# Patient Record
Sex: Male | Born: 1950 | Race: Black or African American | Hispanic: No | Marital: Single | Smoking: Never smoker
Health system: Southern US, Community
[De-identification: ages and names within clinical notes are randomized; demographics above are authoritative.]

## PROBLEM LIST (undated history)

## (undated) DIAGNOSIS — K56600 Partial intestinal obstruction, unspecified as to cause: Secondary | ICD-10-CM

## (undated) DIAGNOSIS — N319 Neuromuscular dysfunction of bladder, unspecified: Secondary | ICD-10-CM

## (undated) DIAGNOSIS — J189 Pneumonia, unspecified organism: Secondary | ICD-10-CM

## (undated) DIAGNOSIS — E440 Moderate protein-calorie malnutrition: Secondary | ICD-10-CM

## (undated) DIAGNOSIS — I10 Essential (primary) hypertension: Secondary | ICD-10-CM

## (undated) DIAGNOSIS — Z993 Dependence on wheelchair: Secondary | ICD-10-CM

## (undated) DIAGNOSIS — E7800 Pure hypercholesterolemia, unspecified: Secondary | ICD-10-CM

## (undated) DIAGNOSIS — D649 Anemia, unspecified: Secondary | ICD-10-CM

## (undated) DIAGNOSIS — I82409 Acute embolism and thrombosis of unspecified deep veins of unspecified lower extremity: Secondary | ICD-10-CM

## (undated) DIAGNOSIS — J9600 Acute respiratory failure, unspecified whether with hypoxia or hypercapnia: Secondary | ICD-10-CM

## (undated) DIAGNOSIS — F0390 Unspecified dementia without behavioral disturbance: Secondary | ICD-10-CM

## (undated) DIAGNOSIS — Z9981 Dependence on supplemental oxygen: Secondary | ICD-10-CM

## (undated) DIAGNOSIS — E871 Hypo-osmolality and hyponatremia: Secondary | ICD-10-CM

## (undated) DIAGNOSIS — I639 Cerebral infarction, unspecified: Secondary | ICD-10-CM

## (undated) DIAGNOSIS — K219 Gastro-esophageal reflux disease without esophagitis: Secondary | ICD-10-CM

## (undated) DIAGNOSIS — G4700 Insomnia, unspecified: Secondary | ICD-10-CM

## (undated) DIAGNOSIS — D131 Benign neoplasm of stomach: Secondary | ICD-10-CM

## (undated) DIAGNOSIS — N179 Acute kidney failure, unspecified: Secondary | ICD-10-CM

## (undated) DIAGNOSIS — E119 Type 2 diabetes mellitus without complications: Secondary | ICD-10-CM

## (undated) DIAGNOSIS — K259 Gastric ulcer, unspecified as acute or chronic, without hemorrhage or perforation: Secondary | ICD-10-CM

## (undated) DIAGNOSIS — N400 Enlarged prostate without lower urinary tract symptoms: Secondary | ICD-10-CM

## (undated) DIAGNOSIS — F32A Depression, unspecified: Secondary | ICD-10-CM

## (undated) DIAGNOSIS — M1990 Unspecified osteoarthritis, unspecified site: Secondary | ICD-10-CM

## (undated) DIAGNOSIS — J984 Other disorders of lung: Secondary | ICD-10-CM

## (undated) HISTORY — DX: Anemia, unspecified: D64.9

## (undated) HISTORY — DX: Gastro-esophageal reflux disease without esophagitis: K21.9

## (undated) HISTORY — DX: Acute embolism and thrombosis of unspecified deep veins of unspecified lower extremity: I82.409

## (undated) HISTORY — DX: Gastric ulcer, unspecified as acute or chronic, without hemorrhage or perforation: K25.9

## (undated) HISTORY — DX: Cerebral infarction, unspecified: I63.9

## (undated) HISTORY — DX: Neuromuscular dysfunction of bladder, unspecified: N31.9

## (undated) HISTORY — DX: Essential (primary) hypertension: I10

## (undated) HISTORY — DX: Benign prostatic hyperplasia without lower urinary tract symptoms: N40.0

## (undated) HISTORY — PX: HIP SURGERY: SHX245

## (undated) HISTORY — DX: Acute kidney failure, unspecified: N17.9

## (undated) HISTORY — DX: Unspecified osteoarthritis, unspecified site: M19.90

## (undated) HISTORY — DX: Type 2 diabetes mellitus without complications: E11.9

## (undated) HISTORY — DX: Other disorders of lung: J98.4

## (undated) HISTORY — DX: Hypo-osmolality and hyponatremia: E87.1

## (undated) HISTORY — DX: Unspecified dementia, unspecified severity, without behavioral disturbance, psychotic disturbance, mood disturbance, and anxiety: F03.90

## (undated) HISTORY — DX: Benign neoplasm of stomach: D13.1

## (undated) HISTORY — DX: Depression, unspecified: F32.A

## (undated) HISTORY — DX: Partial intestinal obstruction, unspecified as to cause: K56.600

## (undated) HISTORY — DX: Pneumonia, unspecified organism: J18.9

## (undated) HISTORY — DX: Acute respiratory failure, unspecified whether with hypoxia or hypercapnia: J96.00

## (undated) HISTORY — DX: Insomnia, unspecified: G47.00

## (undated) HISTORY — PX: KIDNEY SURGERY: SHX687

## (undated) HISTORY — DX: Moderate protein-calorie malnutrition: E44.0

---

## 2000-11-08 ENCOUNTER — Emergency Department (HOSPITAL_COMMUNITY): Admission: EM | Admit: 2000-11-08 | Discharge: 2000-11-08 | Payer: Self-pay | Admitting: Emergency Medicine

## 2012-02-01 DIAGNOSIS — Z96642 Presence of left artificial hip joint: Secondary | ICD-10-CM | POA: Insufficient documentation

## 2016-05-14 ENCOUNTER — Encounter (INDEPENDENT_AMBULATORY_CARE_PROVIDER_SITE_OTHER): Payer: Self-pay | Admitting: *Deleted

## 2016-06-04 ENCOUNTER — Encounter (INDEPENDENT_AMBULATORY_CARE_PROVIDER_SITE_OTHER): Payer: Self-pay | Admitting: *Deleted

## 2016-09-10 ENCOUNTER — Ambulatory Visit: Payer: Self-pay | Admitting: Family Medicine

## 2016-09-23 ENCOUNTER — Ambulatory Visit (INDEPENDENT_AMBULATORY_CARE_PROVIDER_SITE_OTHER): Payer: Medicare Other | Admitting: Family Medicine

## 2016-09-23 ENCOUNTER — Encounter: Payer: Self-pay | Admitting: Family Medicine

## 2016-09-23 VITALS — BP 160/78 | HR 64 | Temp 97.7°F | Resp 18 | Ht 65.0 in | Wt 161.0 lb

## 2016-09-23 DIAGNOSIS — Z7689 Persons encountering health services in other specified circumstances: Secondary | ICD-10-CM

## 2016-09-23 DIAGNOSIS — N401 Enlarged prostate with lower urinary tract symptoms: Secondary | ICD-10-CM

## 2016-09-23 DIAGNOSIS — F0390 Unspecified dementia without behavioral disturbance: Secondary | ICD-10-CM | POA: Insufficient documentation

## 2016-09-23 DIAGNOSIS — F79 Unspecified intellectual disabilities: Secondary | ICD-10-CM | POA: Diagnosis not present

## 2016-09-23 DIAGNOSIS — N400 Enlarged prostate without lower urinary tract symptoms: Secondary | ICD-10-CM | POA: Insufficient documentation

## 2016-09-23 DIAGNOSIS — I10 Essential (primary) hypertension: Secondary | ICD-10-CM

## 2016-09-23 DIAGNOSIS — F0280 Dementia in other diseases classified elsewhere without behavioral disturbance: Secondary | ICD-10-CM | POA: Diagnosis not present

## 2016-09-23 DIAGNOSIS — E119 Type 2 diabetes mellitus without complications: Secondary | ICD-10-CM | POA: Insufficient documentation

## 2016-09-23 DIAGNOSIS — G300 Alzheimer's disease with early onset: Secondary | ICD-10-CM | POA: Diagnosis not present

## 2016-09-23 DIAGNOSIS — R338 Other retention of urine: Secondary | ICD-10-CM

## 2016-09-23 NOTE — Progress Notes (Signed)
Chief Complaint  Patient presents with  . Establish Care   Mentally impaired adult that lives in a group home He is here with one of his caregivers. She has known him for over 10 years. There are no health concerns. There are no behavior problems. He has been well cared for by his prior physician. His prior physician retired. He eats well. He sleeps well. He works every day. He has known osteoarthritis of his right knee. It is somewhat stiff. Since he continues to function well, he has not had any x-rays or additional evaluation. He has blood pressure that has been well controlled. He has diabetes that has been well controlled. He gets yearly eye exams. He gets dental exams twice a year. Shots are up-to-date. He has not had a colonoscopy, this is due.   Patient Active Problem List   Diagnosis Date Noted  . Mental impairment 09/23/2016  . Hypertension 09/23/2016  . Type 2 diabetes mellitus without complication, without long-term current use of insulin ([place]) 09/23/2016  . BPH (benign prostatic hyperplasia) 09/23/2016  . Dementia 09/23/2016    Outpatient Encounter Prescriptions as of 09/23/2016  Medication Sig  . acetaminophen (TYLENOL) 500 MG tablet Take 500 mg by mouth 2 (two) times daily as needed. For knee pain  . acetaminophen (TYLENOL) 650 MG CR tablet Take 650 mg by mouth 2 (two) times daily.  Marland Kitchen alum & mag hydroxide-simeth (MAALOX PLUS) 400-400-40 MG/5ML suspension Take 10 mLs by mouth 3 (three) times daily as needed for indigestion.  Marland Kitchen amLODipine (NORVASC) 5 MG tablet Take 5 mg by mouth daily.  Marland Kitchen aspirin EC 81 MG tablet Take 81 mg by mouth daily.  . CHANTAL SUN SCREEN SPF 30 EX Apply topically.  . cholecalciferol (VITAMIN D) 1000 units tablet Take 2,000 Units by mouth daily.  Marland Kitchen donepezil (ARICEPT) 10 MG tablet Take 10 mg by mouth at bedtime.  . Emollient (VASELINE INTENSIVE CARE EX) Apply topically. Apply twice daily for mild dry skin.  . ferrous sulfate 325 (65 FE) MG  tablet Take 325 mg by mouth daily with breakfast.  . FORA LANCETS MISC by Does not apply route. Test once daily  . glucose blood (FORA V30A BLOOD GLUCOSE TEST) test strip 1 each by Other route daily. Use as instructed  . guaiFENesin (ROBITUSSIN) 100 MG/5ML liquid Take 200 mg by mouth 4 (four) times daily as needed for cough.  . hydrochlorothiazide (MICROZIDE) 12.5 MG capsule Take 12.5 mg by mouth daily.  . hydrocortisone cream 1 % Apply 1 application topically 3 (three) times daily as needed for itching.  . loperamide (IMODIUM) 2 MG capsule Take 4 mg by mouth as needed for diarrhea or loose stools. As needed after second loose stool, max 3 doses.  . magnesium hydroxide (MILK OF MAGNESIA) 400 MG/5ML suspension Take by mouth. Take 30 ml after 3 days with no BM.  . metFORMIN (GLUCOPHAGE) 500 MG tablet Take by mouth 2 (two) times daily with a meal.  . neomycin-bacitracin-polymyxin (NEOSPORIN) [phone] ointment Apply topically as needed.  . ramipril (ALTACE) 10 MG capsule Take 10 mg by mouth daily.  . Skin Protectants, Misc. (LIP BALM) STCK Apply topically. As needed to chapped lips  . tamsulosin (FLOMAX) 0.4 MG CAPS capsule Take 0.4 mg by mouth.  Marland Kitchen UNABLE TO FIND Off skintastic as needed for biting insects.   No facility-administered encounter medications on file as of 09/23/2016.     Past Medical History:  Diagnosis Date  . Diabetes mellitus without complication (  [place])   . Hypertension     Past Surgical History:  Procedure Laterality Date  . HIP SURGERY     left  . KIDNEY SURGERY      Social History   Social History  . Marital status: Single    Spouse name: N/A  . Number of children: N/A  . Years of education: N/A   Occupational History  . Not on file.   Social History Main Topics  . Smoking status: Never Smoker  . Smokeless tobacco: Never Used  . Alcohol use No  . Drug use: No  . Sexual activity: No   Other Topics Concern  . Not on file   Social History Narrative  .  No narrative on file    Family History  Problem Relation Age of Onset  . Family history unknown: Yes    Review of Systems  Constitutional: Negative for chills, fever and weight loss.  HENT: Negative for congestion and hearing loss.   Eyes: Negative for blurred vision and pain.       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   Cardiovascular: Negative for chest pain and leg swelling.  Gastrointestinal: Negative for constipation and diarrhea.  Genitourinary: Negative for dysuria and frequency.  Musculoskeletal: Positive for joint pain. Negative for falls and myalgias.       Right knee  Neurological: Negative for dizziness and seizures.  Psychiatric/Behavioral: Negative for depression. The patient is not nervous/anxious and does not have insomnia.        No behavior issues  See history of present illness. Review of systems is largely from discussion with caregiver  BP (!) 160/78 (BP Location: Right Arm, Patient Position: Sitting, Cuff Size: Normal)   Pulse 64   Temp 97.7 F (36.5 C) (Temporal)   Resp 18   Ht 5\' 5"  (1.651 m)   Wt 161 lb 0.6 oz (73 kg)   SpO2 99%   BMI 26.80 kg/m   Physical Exam  Constitutional: He appears well-developed and well-nourished. No distress.  Pleasant and cooperative. Well dressed and groomed. Poor fund of knowledge.  HENT:  Head: Normocephalic and atraumatic.  Right Ear: External ear normal.  Left Ear: External ear normal.  Mouth/Throat: Oropharynx is clear and moist.  Partial plates, teeth well repaired  Eyes: Pupils are equal, round, and reactive to light.  Right eye with scarring  Neck: Normal range of motion. No thyromegaly present.  Cardiovascular: Normal rate, regular rhythm and normal heart sounds.   Pulmonary/Chest: Effort normal and breath sounds normal.  Abdominal: Soft. Bowel sounds are normal.  No palpable organomegaly  Lymphadenopathy:    He has no cervical adenopathy.    Neurological: He is alert.  Psychiatric: He has a normal mood and affect. His behavior is normal.   assessment/plan  1. Type 2 diabetes mellitus without complication, without long-term current use of insulin ([facility])  - Hemoglobin A1c - Lipid panel - Comprehensive metabolic panel - Urinalysis, Routine w reflex microscopic - Microalbumin, urine - CBC  2. Mental impairment   3. Essential hypertension   4. Benign prostatic hyperplasia with urinary retention   5. Early onset Alzheimer's dementia without behavioral disturbance   6. Encounter to establish care with new doctor    Patient Instructions  Need old records   Continue same medicines and treatment  See me in 3 months Get blood work prior   Raylene Everts, MD

## 2016-09-23 NOTE — Patient Instructions (Signed)
Need old records   Continue same medicines and treatment  See me in 3 months Get blood work prior

## 2016-12-19 ENCOUNTER — Ambulatory Visit: Payer: Medicare Other | Admitting: Family Medicine

## 2016-12-31 ENCOUNTER — Other Ambulatory Visit: Payer: Self-pay | Admitting: Family Medicine

## 2016-12-31 LAB — LIPID PANEL
Cholesterol: 160 mg/dL (ref <200)
HDL: 76 mg/dL (ref >40)
LDL Cholesterol: 71 mg/dL (ref <100)
Total CHOL/HDL Ratio: 2.1 Ratio (ref <5.0)
Triglycerides: 63 mg/dL (ref <150)
VLDL: 13 mg/dL (ref <30)

## 2016-12-31 LAB — CBC
HCT: 36.3 % — ABNORMAL LOW (ref 38.5–50.0)
Hemoglobin: 11.6 g/dL — ABNORMAL LOW (ref 13.2–17.1)
MCH: 28.6 pg (ref 27.0–33.0)
MCHC: 32 g/dL (ref 32.0–36.0)
MCV: 89.6 fL (ref 80.0–100.0)
MPV: 10.6 fL (ref 7.5–12.5)
Platelets: 236 10*3/uL (ref 140–400)
RBC: 4.05 MIL/uL — ABNORMAL LOW (ref 4.20–5.80)
RDW: 13.6 % (ref 11.0–15.0)
WBC: 5.4 10*3/uL (ref 3.8–10.8)

## 2016-12-31 LAB — COMPREHENSIVE METABOLIC PANEL
ALT: 20 U/L (ref 9–46)
AST: 17 U/L (ref 10–35)
Albumin: 3.7 g/dL (ref 3.6–5.1)
Alkaline Phosphatase: 60 U/L (ref 40–115)
BUN: 25 mg/dL (ref 7–25)
CO2: 27 mmol/L (ref 20–31)
Calcium: 10.1 mg/dL (ref 8.6–10.3)
Chloride: 99 mmol/L (ref 98–110)
Creat: 1.29 mg/dL — ABNORMAL HIGH (ref 0.70–1.25)
Glucose, Bld: 98 mg/dL (ref 65–99)
Potassium: 4.4 mmol/L (ref 3.5–5.3)
Sodium: 137 mmol/L (ref 135–146)
Total Bilirubin: 0.4 mg/dL (ref 0.2–1.2)
Total Protein: 6.5 g/dL (ref 6.1–8.1)

## 2017-01-01 ENCOUNTER — Encounter: Payer: Self-pay | Admitting: Family Medicine

## 2017-01-01 ENCOUNTER — Ambulatory Visit (INDEPENDENT_AMBULATORY_CARE_PROVIDER_SITE_OTHER): Payer: Medicare Other | Admitting: Family Medicine

## 2017-01-01 VITALS — BP 130/82 | HR 64 | Temp 97.3°F | Resp 16 | Ht 65.0 in | Wt 160.1 lb

## 2017-01-01 DIAGNOSIS — M25561 Pain in right knee: Secondary | ICD-10-CM | POA: Diagnosis not present

## 2017-01-01 DIAGNOSIS — G300 Alzheimer's disease with early onset: Secondary | ICD-10-CM

## 2017-01-01 DIAGNOSIS — M1711 Unilateral primary osteoarthritis, right knee: Secondary | ICD-10-CM

## 2017-01-01 DIAGNOSIS — F0280 Dementia in other diseases classified elsewhere without behavioral disturbance: Secondary | ICD-10-CM

## 2017-01-01 DIAGNOSIS — G8929 Other chronic pain: Secondary | ICD-10-CM

## 2017-01-01 DIAGNOSIS — E119 Type 2 diabetes mellitus without complications: Secondary | ICD-10-CM

## 2017-01-01 DIAGNOSIS — I10 Essential (primary) hypertension: Secondary | ICD-10-CM | POA: Diagnosis not present

## 2017-01-01 LAB — URINALYSIS, ROUTINE W REFLEX MICROSCOPIC
Bilirubin Urine: NEGATIVE
Glucose, UA: NEGATIVE
Hgb urine dipstick: NEGATIVE
Ketones, ur: NEGATIVE
Leukocytes, UA: NEGATIVE
Nitrite: NEGATIVE
Protein, ur: NEGATIVE
Specific Gravity, Urine: 1.014 (ref 1.001–1.035)
pH: 5.5 (ref 5.0–8.0)

## 2017-01-01 LAB — HEMOGLOBIN A1C
Hgb A1c MFr Bld: 6.7 % — ABNORMAL HIGH (ref <5.7)
Mean Plasma Glucose: 146 mg/dL

## 2017-01-01 LAB — MICROALBUMIN, URINE: Microalb, Ur: 0.4 mg/dL

## 2017-01-01 NOTE — Progress Notes (Signed)
Chief Complaint  Patient presents with  . Follow-up   Here for follow up with one of his caregivers. They have no problems to report. They bring a log of daily blood sugars. They're always under 150. We discussed his recent lab work. His hemoglobin A1c is 6.7. This is excellent diabetes control. His LDL is 71. He is mildly anemic but this appears to be stable. He is compliant with medications. He is active in his home and at his job. He is good about his diet. He does not complain of pain in his knee. He does limp. We discussed immunization update. At the facility he needs a flu shot and Prevnar at this fall. He also should have a shingles series. He needs a tetanus over 10 years. He's never had colon cancer screening. I told them again today that this is a good idea. They need to talk to his guardian for permission. Patient Active Problem List   Diagnosis Date Noted  . Right knee pain 01/01/2017  . Osteoarthritis of right knee 01/01/2017  . Mental impairment 09/23/2016  . Hypertension 09/23/2016  . Type 2 diabetes mellitus without complication, without long-term current use of insulin ([place]) 09/23/2016  . BPH (benign prostatic hyperplasia) 09/23/2016  . Dementia 09/23/2016    Outpatient Encounter Prescriptions as of 01/01/2017  Medication Sig  . acetaminophen (TYLENOL) 650 MG CR tablet Take 650 mg by mouth 2 (two) times daily.  Marland Kitchen alum & mag hydroxide-simeth (MAALOX PLUS) 400-400-40 MG/5ML suspension Take 10 mLs by mouth 3 (three) times daily as needed for indigestion.  Marland Kitchen amLODipine (NORVASC) 5 MG tablet Take 5 mg by mouth daily.  Marland Kitchen aspirin EC 81 MG tablet Take 81 mg by mouth daily.  . CHANTAL SUN SCREEN SPF 30 EX Apply topically.  . cholecalciferol (VITAMIN D) 1000 units tablet Take 2,000 Units by mouth daily.  Marland Kitchen donepezil (ARICEPT) 10 MG tablet Take 10 mg by mouth at bedtime.  . Emollient (VASELINE INTENSIVE CARE EX) Apply topically. Apply twice daily for mild dry skin.  .  ferrous sulfate 325 (65 FE) MG tablet Take 325 mg by mouth daily with breakfast.  . FORA LANCETS MISC by Does not apply route. Test once daily  . glucose blood (FORA V30A BLOOD GLUCOSE TEST) test strip 1 each by Other route daily. Use as instructed  . guaiFENesin (ROBITUSSIN) 100 MG/5ML liquid Take 200 mg by mouth 4 (four) times daily as needed for cough.  . hydrochlorothiazide (MICROZIDE) 12.5 MG capsule Take 12.5 mg by mouth daily.  . hydrocortisone cream 1 % Apply 1 application topically 3 (three) times daily as needed for itching.  . loperamide (IMODIUM) 2 MG capsule Take 4 mg by mouth as needed for diarrhea or loose stools. As needed after second loose stool, max 3 doses.  . magnesium hydroxide (MILK OF MAGNESIA) 400 MG/5ML suspension Take by mouth. Take 30 ml after 3 days with no BM.  . metFORMIN (GLUCOPHAGE) 500 MG tablet Take by mouth 2 (two) times daily with a meal.  . neomycin-bacitracin-polymyxin (NEOSPORIN) [phone] ointment Apply topically as needed.  . ramipril (ALTACE) 10 MG capsule Take 10 mg by mouth daily.  . Skin Protectants, Misc. (LIP BALM) STCK Apply topically. As needed to chapped lips  . tamsulosin (FLOMAX) 0.4 MG CAPS capsule Take 0.4 mg by mouth.  Marland Kitchen UNABLE TO FIND Off skintastic as needed for biting insects.  . [DISCONTINUED] acetaminophen (TYLENOL) 500 MG tablet Take 500 mg by mouth 2 (two) times daily  as needed. For knee pain   No facility-administered encounter medications on file as of 01/01/2017.     No Known Allergies  Review of Systems  Constitutional: Negative for activity change, appetite change, fatigue and unexpected weight change.  HENT: Negative for congestion and dental problem.        Yearly dental exam  Eyes: Negative for photophobia and visual disturbance.       Yearly eye exam  Respiratory: Negative for cough and choking.   Cardiovascular: Negative for chest pain, palpitations and leg swelling.  Gastrointestinal: Negative for blood in stool,  constipation and diarrhea.  Genitourinary: Negative for difficulty urinating and frequency.  Musculoskeletal: Positive for arthralgias and gait problem.       Right knee arthritis  Neurological: Negative for dizziness and headaches.  Psychiatric/Behavioral: Negative for dysphoric mood and sleep disturbance.       No behaviors  All other systems reviewed and are negative.   BP 130/82 (BP Location: Right Arm, Patient Position: Sitting, Cuff Size: Normal)   Pulse 64   Temp (!) 97.3 F (36.3 C) (Temporal)   Resp 16   Ht 5\' 5"  (1.651 m)   Wt 160 lb 1.9 oz (72.6 kg)   SpO2 99%   BMI 26.65 kg/m   Physical Exam  Constitutional: He appears well-developed and well-nourished. No distress.  Poor eye contact. Mentally impaired.  HENT:  Head: Normocephalic and atraumatic.  Mouth/Throat: Oropharynx is clear and moist.  Eyes: Pupils are equal, round, and reactive to light. Conjunctivae are normal.  Neck: Normal range of motion. No thyromegaly present.  Cardiovascular: Normal rate, regular rhythm and normal heart sounds.   Pulmonary/Chest: Effort normal and breath sounds normal.  Musculoskeletal:  Right knee has warmth and crepitus. Gait has small steps, slightly  flexed posture, minimal limp  Neurological: He is alert.  Psychiatric: His behavior is normal.    ASSESSMENT/PLAN:  1. Chronic pain of right knee  2. Primary osteoarthritis of right knee  3. Type 2 diabetes mellitus without complication, without long-term current use of insulin ([place])  4. Essential hypertension  5. Early onset Alzheimer's dementia without behavioral disturbance     Patient Instructions  Recommend screening for colon cancer  Recommend he get a flu shot and a prevnar 13 this fall Needs Shingrix series of 2 shots Needs TdaP if has not had in 10 years  Diabetes, hypertension and cholesterol are all WELL controlled  May do fingerstick checks once a week and prn illness/symptoms  See me in 6  months  Call sooner for any problems   Raylene Everts, MD

## 2017-01-01 NOTE — Patient Instructions (Signed)
Recommend screening for colon cancer  Recommend he get a flu shot and a prevnar 13 this fall Needs Shingrix series of 2 shots Needs TdaP if has not had in 10 years  Diabetes, hypertension and cholesterol are all WELL controlled  May do fingerstick checks once a week and prn illness/symptoms  See me in 6 months  Call sooner for any problems

## 2017-01-09 ENCOUNTER — Other Ambulatory Visit: Payer: Self-pay | Admitting: Family Medicine

## 2017-04-10 ENCOUNTER — Emergency Department (HOSPITAL_COMMUNITY): Payer: Medicare Other

## 2017-04-10 ENCOUNTER — Encounter (HOSPITAL_COMMUNITY): Payer: Self-pay | Admitting: Emergency Medicine

## 2017-04-10 ENCOUNTER — Emergency Department (HOSPITAL_COMMUNITY)
Admission: EM | Admit: 2017-04-10 | Discharge: 2017-04-10 | Disposition: A | Discharge status: Home / Self Care | Payer: Medicare Other | Attending: Emergency Medicine | Admitting: Emergency Medicine

## 2017-04-10 DIAGNOSIS — E119 Type 2 diabetes mellitus without complications: Secondary | ICD-10-CM | POA: Insufficient documentation

## 2017-04-10 DIAGNOSIS — F0390 Unspecified dementia without behavioral disturbance: Secondary | ICD-10-CM | POA: Diagnosis not present

## 2017-04-10 DIAGNOSIS — S7001XA Contusion of right hip, initial encounter: Secondary | ICD-10-CM | POA: Insufficient documentation

## 2017-04-10 DIAGNOSIS — Y93H2 Activity, gardening and landscaping: Secondary | ICD-10-CM | POA: Insufficient documentation

## 2017-04-10 DIAGNOSIS — S5001XA Contusion of right elbow, initial encounter: Secondary | ICD-10-CM

## 2017-04-10 DIAGNOSIS — Z79899 Other long term (current) drug therapy: Secondary | ICD-10-CM | POA: Diagnosis not present

## 2017-04-10 DIAGNOSIS — Y929 Unspecified place or not applicable: Secondary | ICD-10-CM | POA: Diagnosis not present

## 2017-04-10 DIAGNOSIS — I10 Essential (primary) hypertension: Secondary | ICD-10-CM | POA: Diagnosis not present

## 2017-04-10 DIAGNOSIS — Z7982 Long term (current) use of aspirin: Secondary | ICD-10-CM | POA: Diagnosis not present

## 2017-04-10 DIAGNOSIS — S59901A Unspecified injury of right elbow, initial encounter: Secondary | ICD-10-CM | POA: Diagnosis present

## 2017-04-10 DIAGNOSIS — Z7984 Long term (current) use of oral hypoglycemic drugs: Secondary | ICD-10-CM | POA: Diagnosis not present

## 2017-04-10 DIAGNOSIS — Y999 Unspecified external cause status: Secondary | ICD-10-CM | POA: Diagnosis not present

## 2017-04-10 DIAGNOSIS — W1830XA Fall on same level, unspecified, initial encounter: Secondary | ICD-10-CM | POA: Diagnosis not present

## 2017-04-10 DIAGNOSIS — W19XXXA Unspecified fall, initial encounter: Secondary | ICD-10-CM

## 2017-04-10 IMAGING — DX DG ELBOW COMPLETE 3+V*R*
4 series · 4 of 4 positions shown · non-contrast
Comparison: None.

CLINICAL DATA: Posterior right elbow pain and abrasion after fall.

EXAM:
RIGHT ELBOW - COMPLETE 3+ VIEW

[elbow ap]
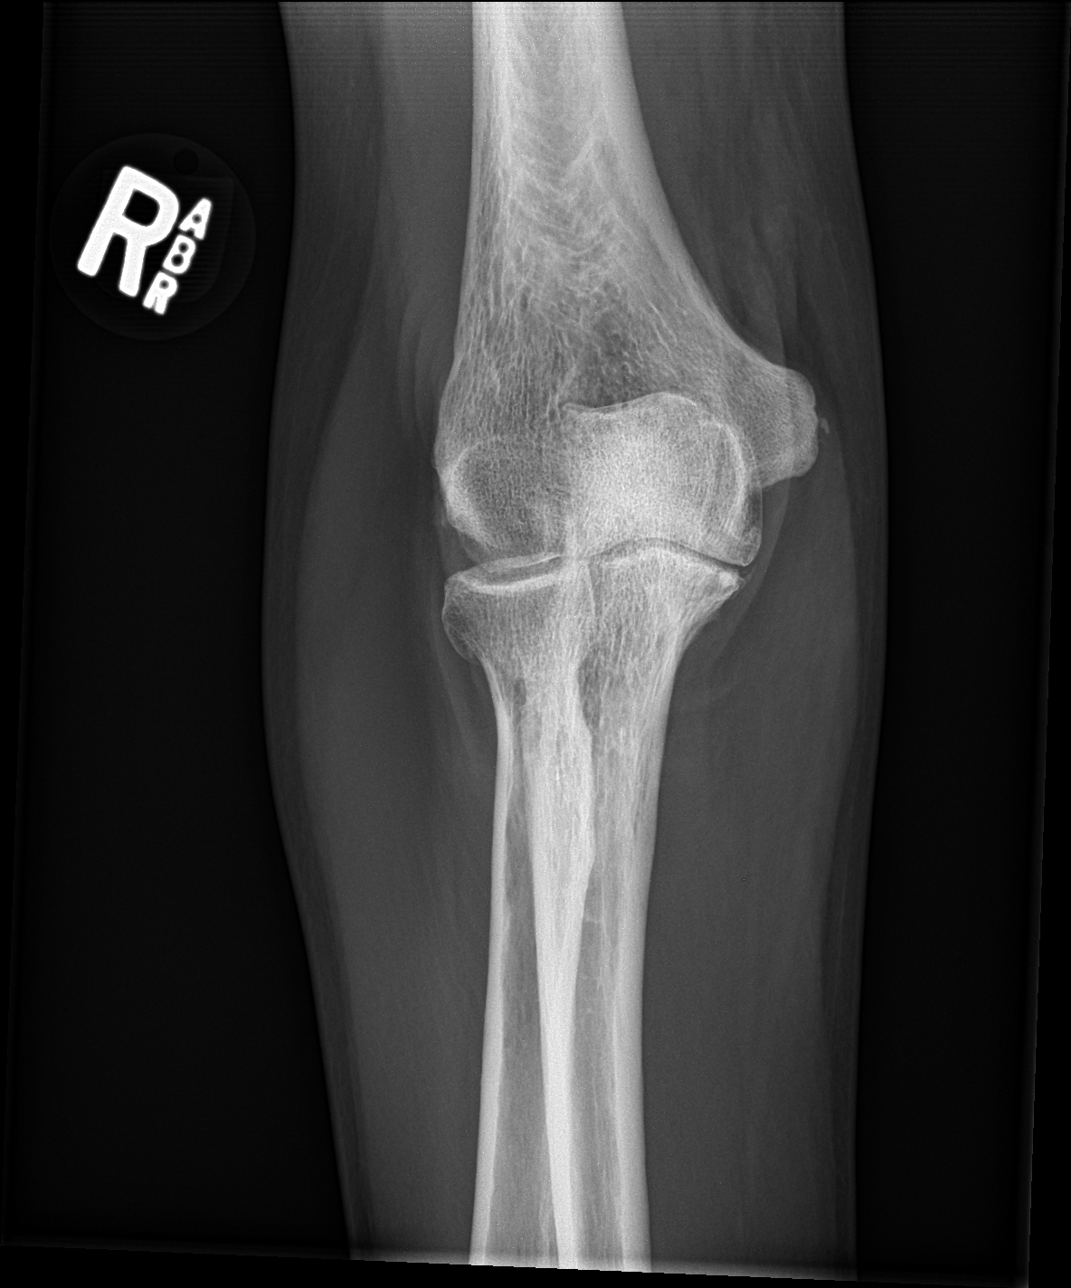

[elbow obl (1 of 2)]
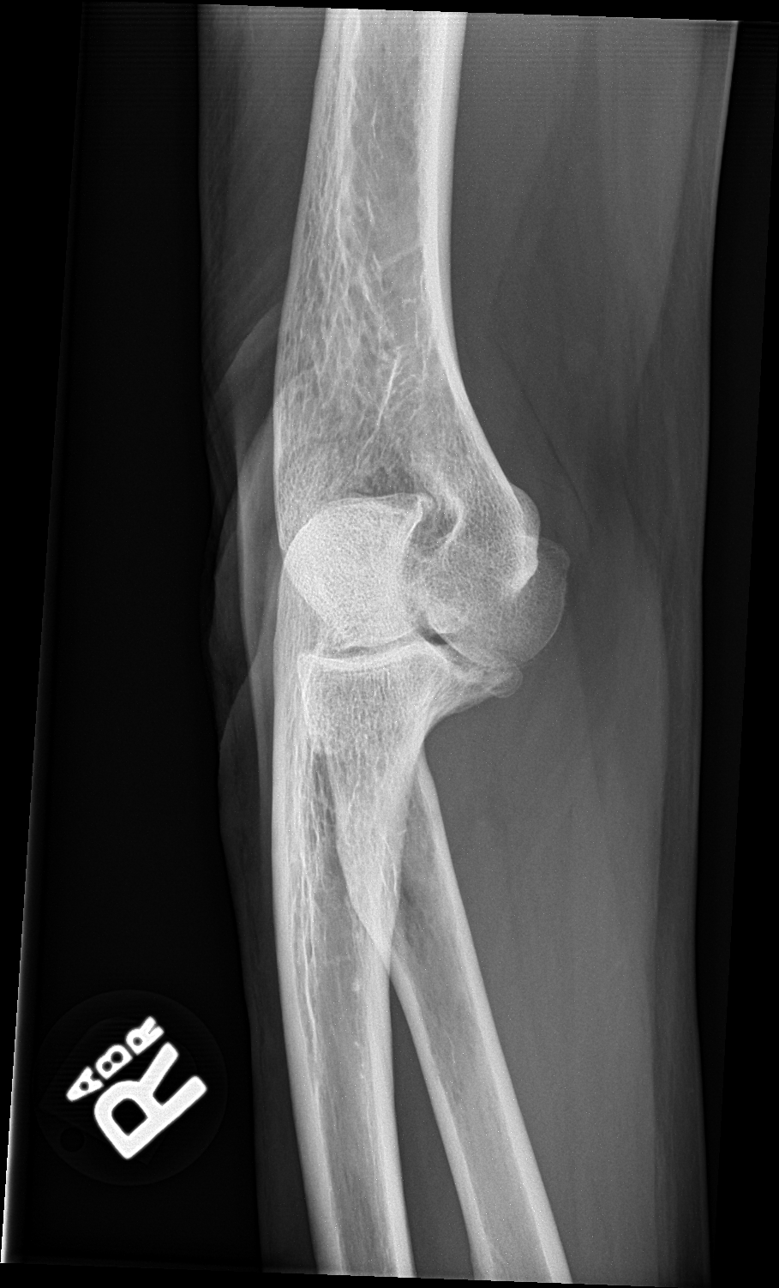

[elbow obl (2 of 2)]
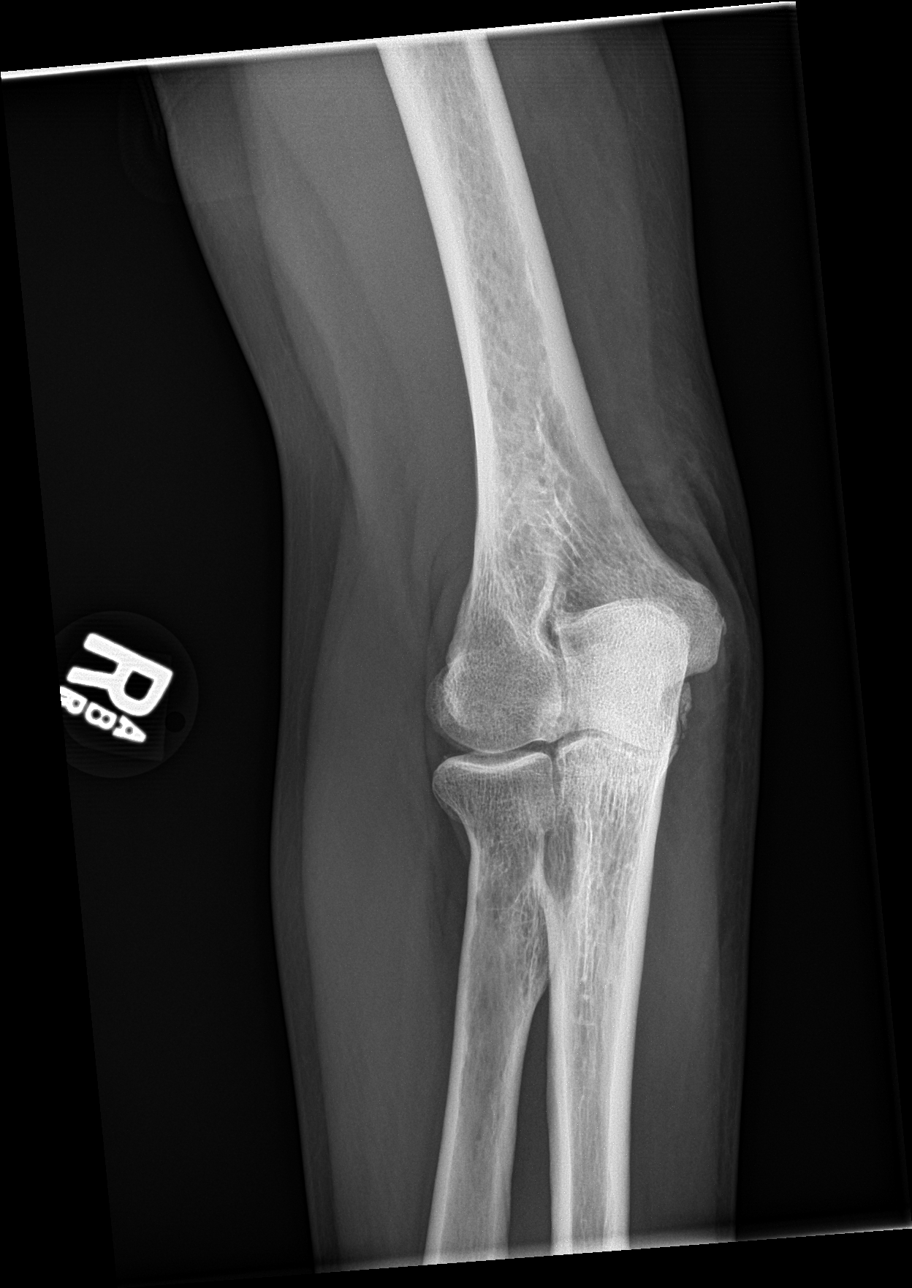

[elbow lat]
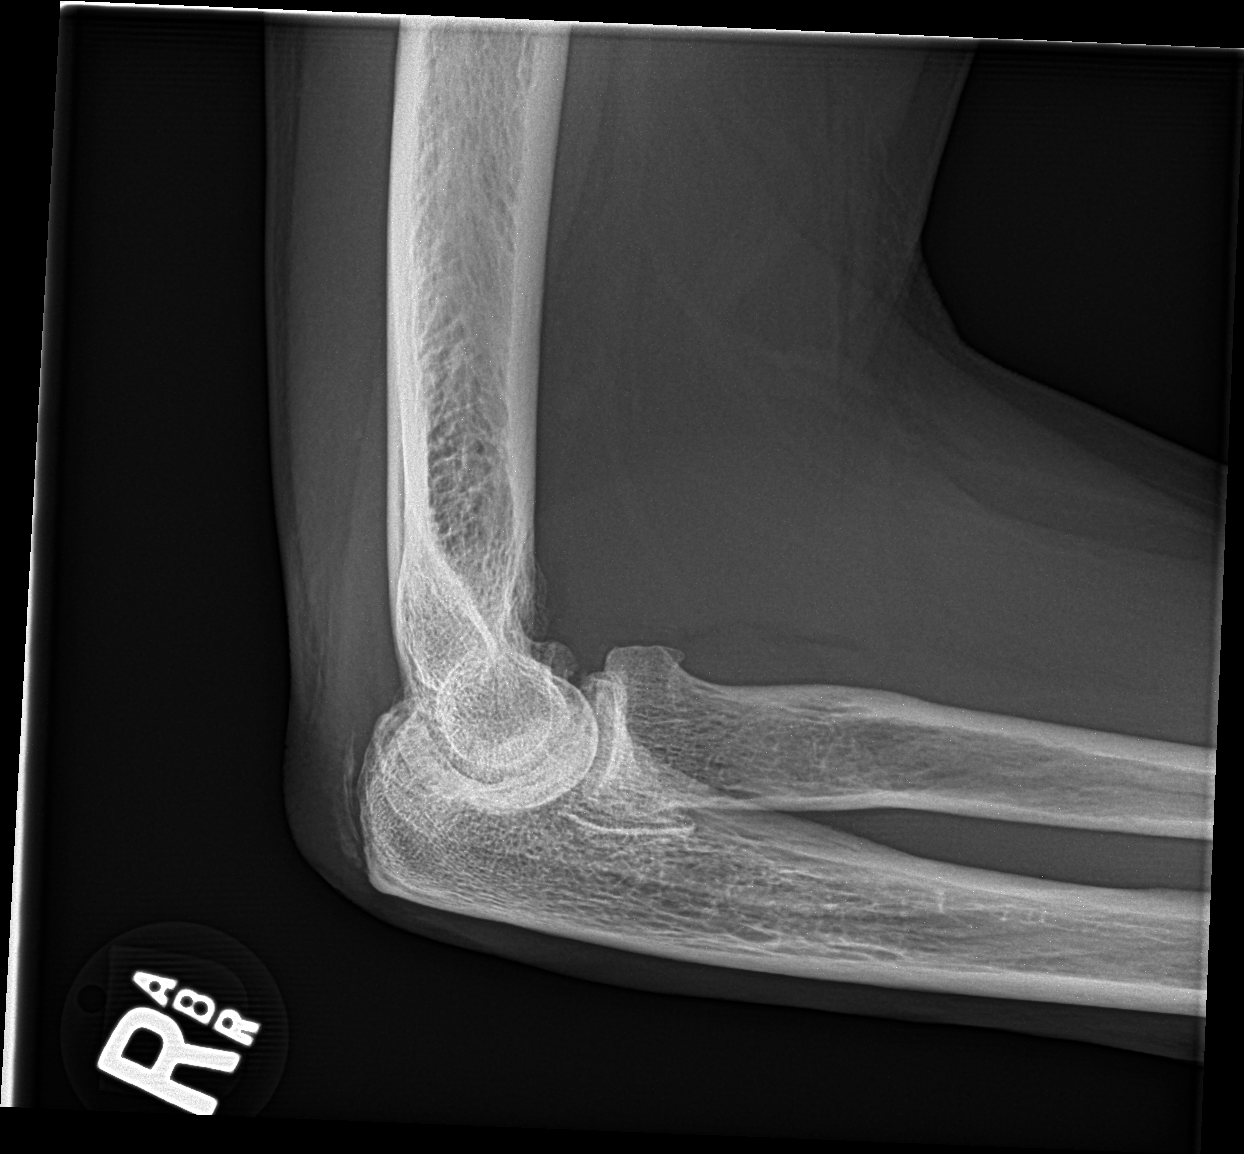

[4 of 4 positions shown; findings below may reference images not displayed]

FINDINGS: Notable spurring of the radial head, distal humerus, and olecranon
with calcifications in the distal triceps tendon. Faint
calcifications in the proximal common extensor tendon and common
flexor tendon. Probable mild chondrocalcinosis along the sublime
tubercle attachment site of the ulnar collateral ligament.

Articular space narrowing in the elbow, likely degenerative.

The anterior fat pad is indistinct.  No definite posterior fat pad.

Soft tissue swelling overlying the olecranon.

I do not appreciate an acute fracture.
IMPRESSION: 1. Spurring along with some ligamentous and tendinous calcifications
as noted above.
2. Indeterminate for elbow effusion, the anterior fat pad is
indistinct.
3. Articular space narrowing in the elbow.
4. Mild dorsal soft tissue swelling overlying the olecranon,
potentially from subcutaneous edema or mild olecranon bursitis.

## 2017-04-10 IMAGING — DX DG HIP (WITH OR WITHOUT PELVIS) 2-3V*R*
3 series · 3 of 3 positions shown · non-contrast
Comparison: None.

CLINICAL DATA: Fall today.  Right hip pain.

EXAM:
DG HIP (WITH OR WITHOUT PELVIS) 2-3V RIGHT

[pelvis ap (1 of 2)]
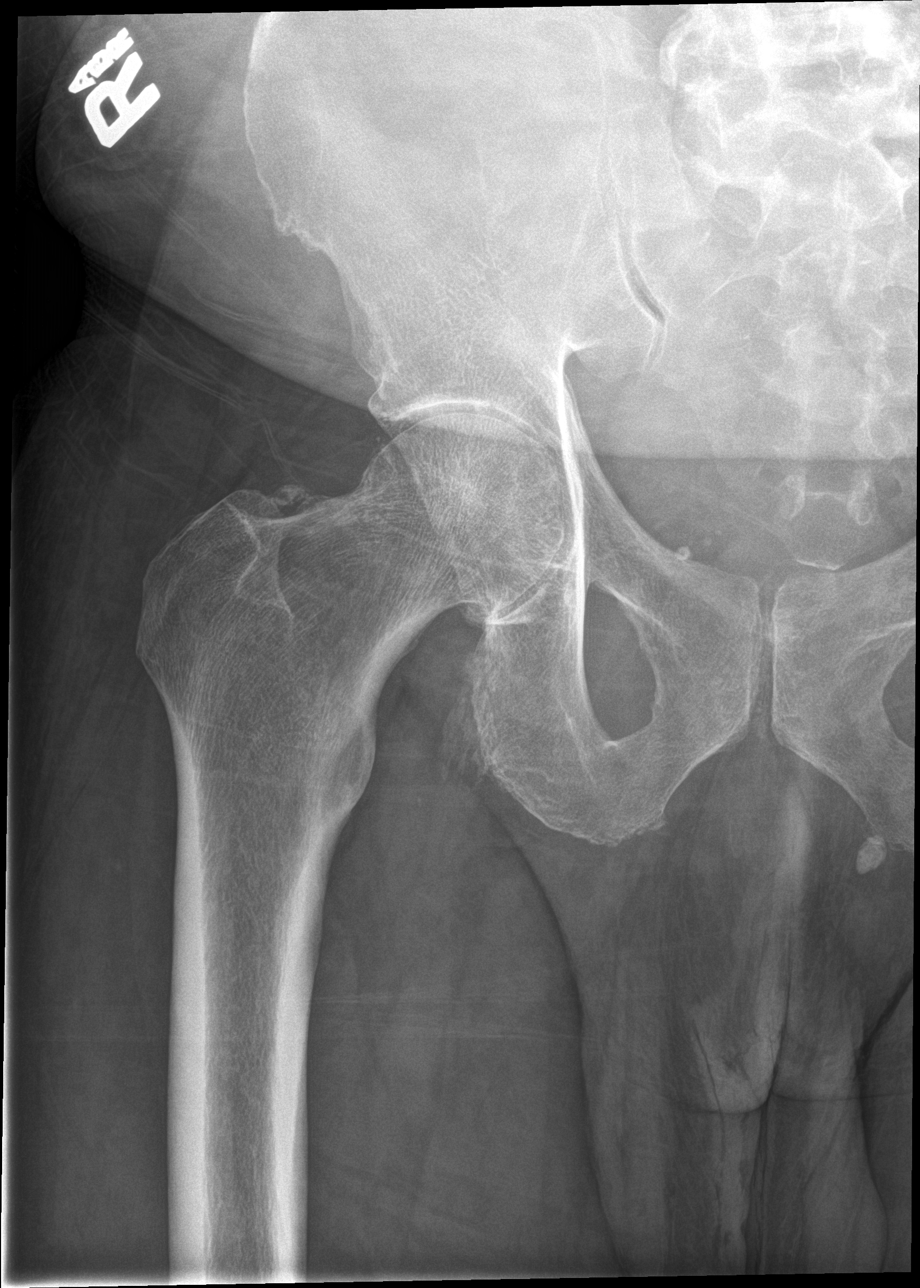

[hip lat]
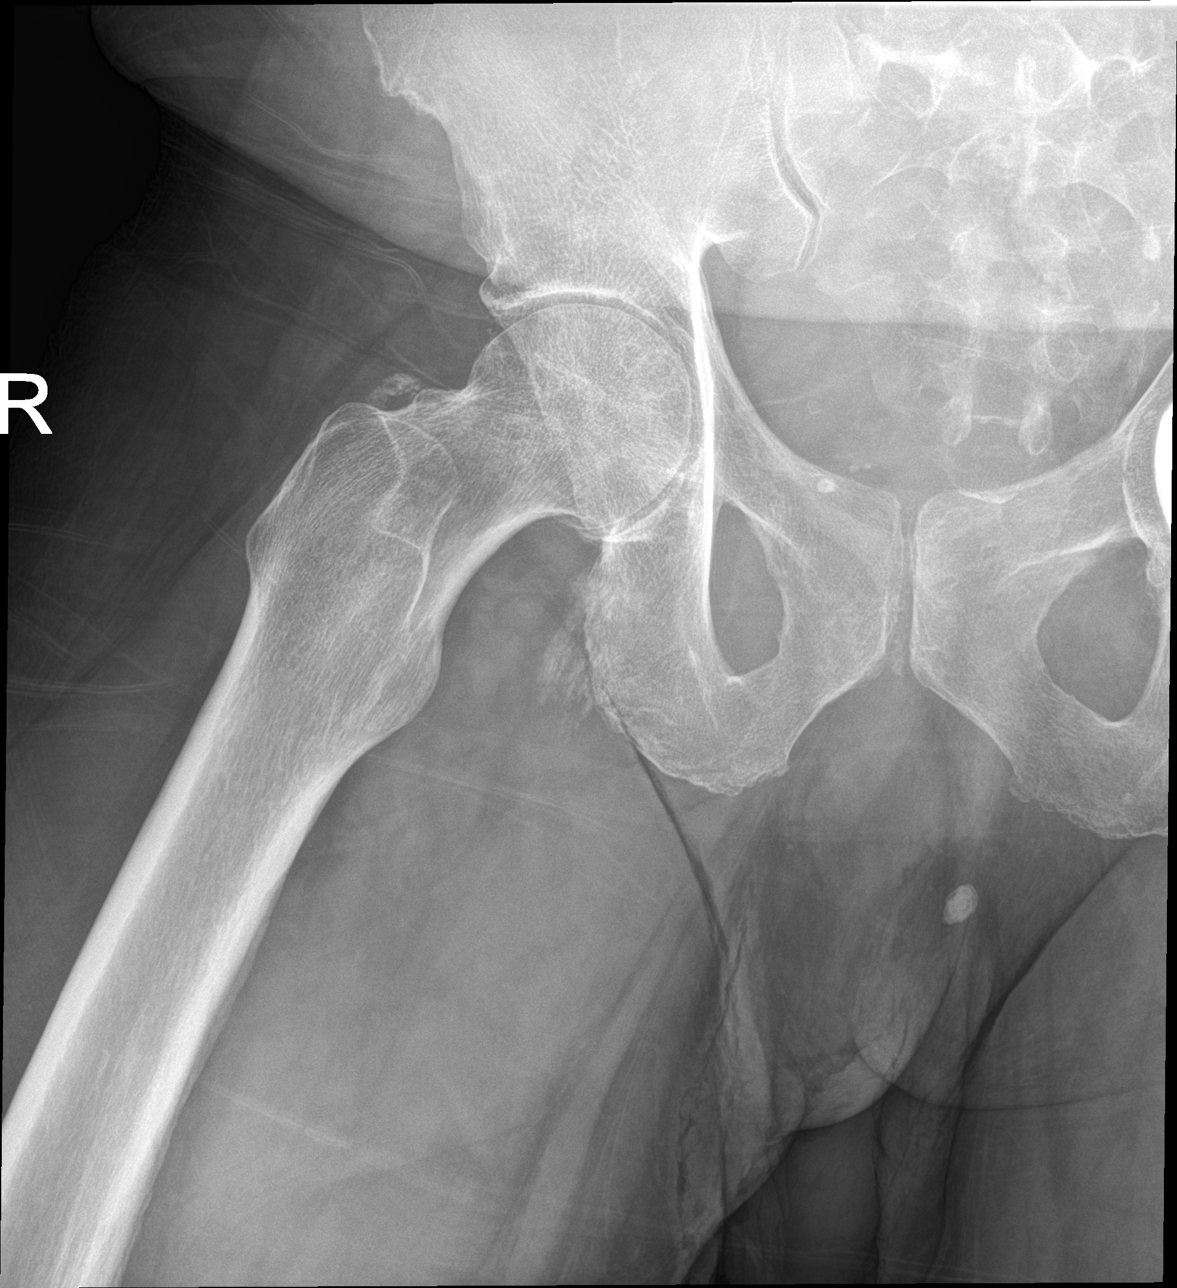

[pelvis ap (2 of 2)]
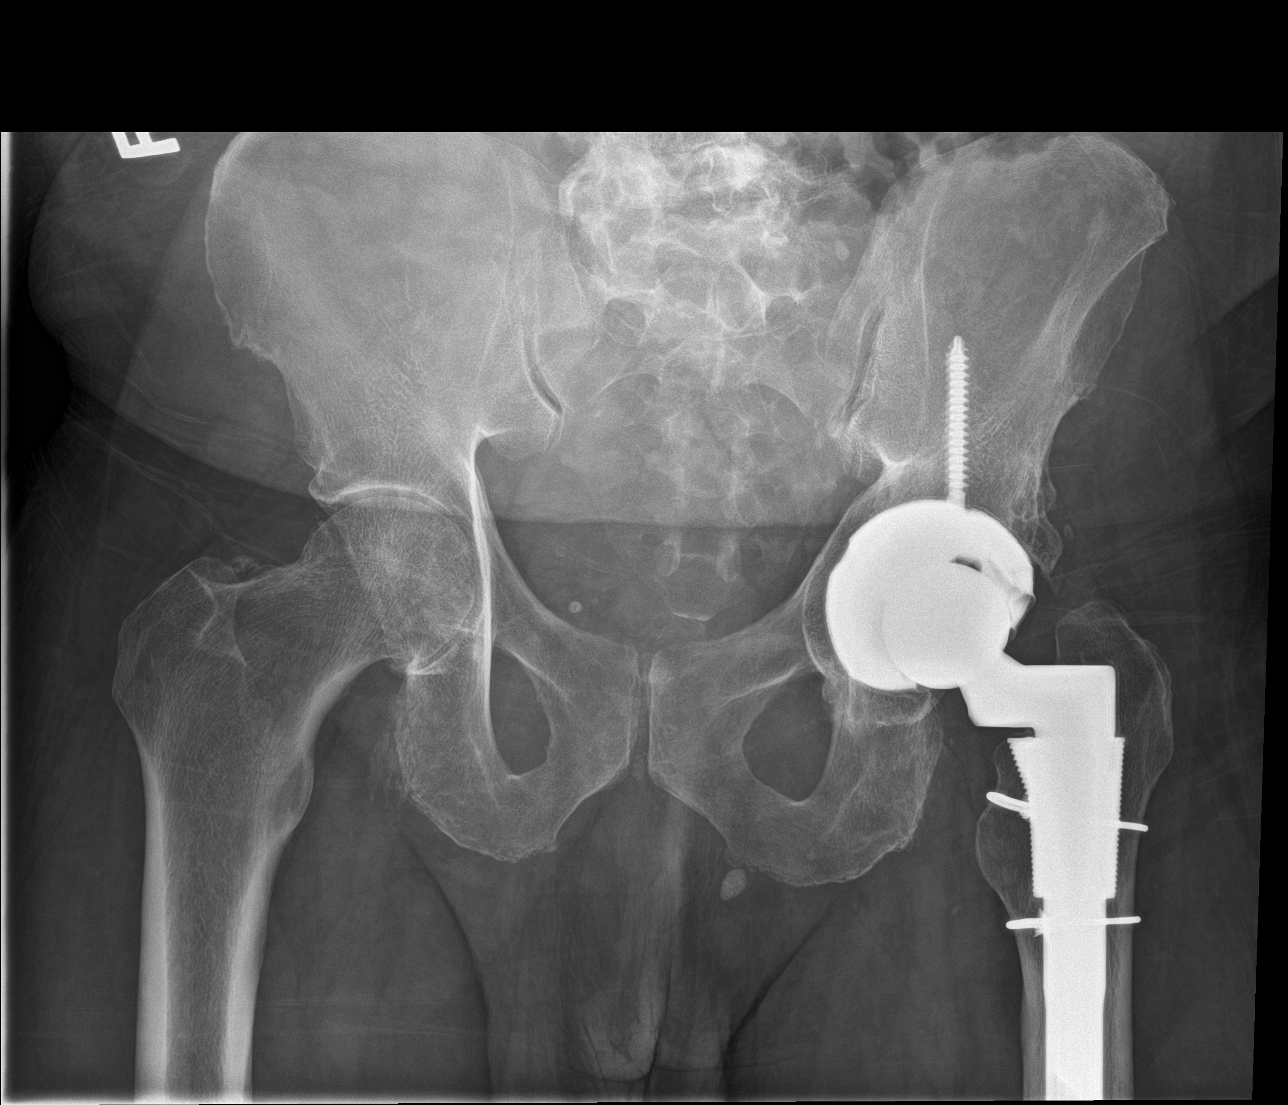

[3 of 3 positions shown; findings below may reference images not displayed]

FINDINGS: The right hip is located. No acute bone or soft tissue abnormalities
are present. Calcifications are noted along the hamstring insertion
at the ischemic tuberosity. Left total hip arthroplasty is noted.
Marked degenerative changes are present in the lower lumbar spine.
IMPRESSION: 1. No acute abnormality of the right hip.
2. Mild degenerative changes in the right hip.
3. Left total hip arthroplasty.

## 2017-04-10 MED ORDER — ACETAMINOPHEN 325 MG PO TABS
650.0000 mg | ORAL_TABLET | Freq: Once | ORAL | Status: AC
  Administered 2017-04-10: 650 mg via ORAL
  Filled 2017-04-10: qty 2

## 2017-04-10 MED ORDER — IBUPROFEN 800 MG PO TABS
800.0000 mg | ORAL_TABLET | Freq: Once | ORAL | Status: AC
  Administered 2017-04-10: 800 mg via ORAL
  Filled 2017-04-10: qty 1

## 2017-04-10 NOTE — ED Triage Notes (Addendum)
Patient c/o right hip pain and right elbow pain. Patient states lost balance while trying to rake leaves. Patient reports landing on left side. Denies hitting head or LOC. Patient has had hip replacement. No obvious deformity. Patient ambulatory.

## 2017-04-10 NOTE — Discharge Instructions (Signed)
Your x-rays are negative for fracture or dislocation.  Please use 600 mg of ibuprofen and 500 mg of Tylenol every 6 hours as needed for soreness.  Apply an ice pack to your hip and thigh area.  See Dr. Meda Coffee, or return to the emergency department if not improving.

## 2017-04-10 NOTE — ED Provider Notes (Signed)
[facility] EMERGENCY DEPARTMENT Provider Note   CSN: [id number] Arrival date & time: 04/10/17  1734     History   Chief Complaint Chief Complaint  Patient presents with  . Fall    HPI Daniel [name] is a 66 y.o. male.  Patient is a 66 year old male who presents to the emergency department with his caretaker following a fall.  The patient states that he was raking some leaves and cleaning up when he lost his balance and fell.  He states he injured his left elbow and left hip and left side.  He denies difficulty with breathing.  He denies hitting his head.  His caregiver states that he is not on any anticoagulation medications and he has no history of bleeding disorders.  The patient has been ambulatory since the fall without problem.      Past Medical History:  Diagnosis Date  . Diabetes mellitus without complication ([place])   . Hypertension     Patient Active Problem List   Diagnosis Date Noted  . Right knee pain 01/01/2017  . Osteoarthritis of right knee 01/01/2017  . Mental impairment 09/23/2016  . Hypertension 09/23/2016  . Type 2 diabetes mellitus without complication, without long-term current use of insulin ([place]) 09/23/2016  . BPH (benign prostatic hyperplasia) 09/23/2016  . Dementia 09/23/2016    Past Surgical History:  Procedure Laterality Date  . HIP SURGERY     left  . KIDNEY SURGERY         Home Medications    Prior to Admission medications   Medication Sig Start Date End Date Taking? Authorizing Provider  acetaminophen (TYLENOL) 650 MG CR tablet Take 650 mg by mouth 2 (two) times daily.    [provider]  alum & mag hydroxide-simeth (MAALOX PLUS) 400-400-40 MG/5ML suspension Take 10 mLs by mouth 3 (three) times daily as needed for indigestion.    [provider]  amLODipine (NORVASC) 5 MG tablet Take 5 mg by mouth daily.    [provider]  aspirin EC 81 MG tablet Take 81 mg by mouth daily.    [provider]    CHANTAL SUN SCREEN SPF 30 EX Apply topically.    [provider]  cholecalciferol (VITAMIN D) 1000 units tablet Take 2,000 Units by mouth daily.    [provider]  donepezil (ARICEPT) 10 MG tablet Take 10 mg by mouth at bedtime.    [provider]  Emollient (VASELINE INTENSIVE CARE EX) Apply topically. Apply twice daily for mild dry skin.    [provider]  ferrous sulfate 325 (65 FE) MG tablet Take 325 mg by mouth daily with breakfast.    [provider]  FORA LANCETS MISC by Does not apply route. Test once daily    [provider]  glucose blood (FORA V30A BLOOD GLUCOSE TEST) test strip 1 each by Other route daily. Use as instructed    [provider]  guaiFENesin (ROBITUSSIN) 100 MG/5ML liquid Take 200 mg by mouth 4 (four) times daily as needed for cough.    [provider]  hydrochlorothiazide (MICROZIDE) 12.5 MG capsule Take 12.5 mg by mouth daily.    [provider]  hydrocortisone cream 1 % Apply 1 application topically 3 (three) times daily as needed for itching.    [provider]  loperamide (IMODIUM) 2 MG capsule Take 4 mg by mouth as needed for diarrhea or loose stools. As needed after second loose stool, max 3 doses.  [provider]  magnesium hydroxide (MILK OF MAGNESIA) 400 MG/5ML suspension Take by mouth. Take 30 ml after 3 days with no BM.    [provider]  metFORMIN (GLUCOPHAGE) 500 MG tablet TAKE 1 TABLET BY MOUTH TWICE DAILY. 01/12/17   Raylene Everts, MD  neomycin-bacitracin-polymyxin (NEOSPORIN) [phone] ointment Apply topically as needed.    [provider]  ramipril (ALTACE) 10 MG capsule Take 10 mg by mouth daily.    [provider]  Skin Protectants, Misc. (LIP BALM) STCK Apply topically. As needed to chapped lips    [provider]  tamsulosin (FLOMAX) 0.4 MG CAPS capsule TAKE 1 CAPSULE BY MOUTH AT BEDTIME. 01/12/17   Raylene Everts, MD  UNABLE TO FIND Off skintastic as needed for biting insects.    [provider]    Family History Family History  Problem Relation Age of Onset  . Family history unknown: Yes    Social History Social History  Substance Use Topics  . Smoking status: Never Smoker  . Smokeless tobacco: Never Used  . Alcohol use No     Allergies   Patient has no known allergies.   Review of Systems Review of Systems  Constitutional: Negative for activity change.       All ROS Neg except as noted in HPI  HENT: Negative for nosebleeds.   Eyes: Negative for photophobia and discharge.  Respiratory: Negative for cough, shortness of breath and wheezing.   Cardiovascular: Negative for chest pain and palpitations.  Gastrointestinal: Negative for abdominal pain and blood in stool.  Genitourinary: Negative for dysuria, frequency and hematuria.  Musculoskeletal: Positive for arthralgias. Negative for back pain and neck pain.  Skin: Negative.   Neurological: Negative for dizziness, seizures and speech difficulty.  Psychiatric/Behavioral: Negative for confusion and hallucinations.     Physical Exam Updated Vital Signs BP (!) 146/80 (BP Location: Left Arm)   Pulse 66   Temp 98.4 F (36.9 C) (Tympanic)   Resp 18   Ht 5\' 5"  (1.651 m)   Wt 72.6 kg (160 lb)   SpO2 100%   BMI 26.63 kg/m   Physical Exam  Constitutional: He is oriented to person, place, and time. He appears well-developed and well-nourished.  Non-toxic appearance.  HENT:  Head: Normocephalic.  Right Ear: Tympanic membrane and external ear normal.  Left Ear: Tympanic membrane and external ear normal.  Eyes: Pupils are equal, round, and reactive to light. EOM and lids are normal.  Neck: Normal range of motion. Neck supple. Carotid bruit is not present.  Cardiovascular: Normal rate, regular rhythm, normal heart sounds, intact distal pulses and normal pulses.   Pulmonary/Chest: Breath sounds normal. No  respiratory distress.  Abdominal: Soft. Bowel sounds are normal. There is no tenderness. There is no guarding.  Musculoskeletal: Normal range of motion.       Right elbow: He exhibits no swelling and no effusion. No tenderness found.       Right hip: He exhibits tenderness. He exhibits normal range of motion, no swelling and no deformity.  There is full range of motion of the right shoulder, elbow, wrist, and fingers.  There is no pain with movement of rocking the pelvis.  There is some mild lateral hip/thigh pain with raising the right leg.  There is no palpable deformity or hematoma appreciated.  Full range of motion is noted of the right knee, ankle, and toes.  Lymphadenopathy:       Head (right side): No submandibular  adenopathy present.       Head (left side): No submandibular adenopathy present.    He has no cervical adenopathy.  Neurological: He is alert and oriented to person, place, and time. He has normal strength. No cranial nerve deficit or sensory deficit.  Skin: Skin is warm and dry.  Psychiatric: He has a normal mood and affect. His speech is normal.  Nursing note and vitals reviewed.    ED Treatments / Results  Labs (all labs ordered are listed, but only abnormal results are displayed) Labs Reviewed - No data to display  EKG  EKG Interpretation None       Radiology Dg Elbow Complete Right (3+view)  Result Date: 04/10/2017 CLINICAL DATA:  Posterior right elbow pain and abrasion after fall. EXAM: RIGHT ELBOW - COMPLETE 3+ VIEW COMPARISON:  None. FINDINGS: Notable spurring of the radial head, distal humerus, and olecranon with calcifications in the distal triceps tendon. Faint calcifications in the proximal common extensor tendon and common flexor tendon. Probable mild chondrocalcinosis along the sublime tubercle attachment site of the ulnar collateral ligament. Articular space narrowing in the elbow, likely degenerative. The anterior fat pad is indistinct.  No definite  posterior fat pad. Soft tissue swelling overlying the olecranon. I do not appreciate an acute fracture. IMPRESSION: 1. Spurring along with some ligamentous and tendinous calcifications as noted above. 2. Indeterminate for elbow effusion, the anterior fat pad is indistinct. 3. Articular space narrowing in the elbow. 4. Mild dorsal soft tissue swelling overlying the olecranon, potentially from subcutaneous edema or mild olecranon bursitis. Electronically Signed   By: Van Clines M.D.   On: 04/10/2017 19:34   Dg Hip Unilat W Or Wo Pelvis 2-3 Views Right  Result Date: 04/10/2017 CLINICAL DATA:  Fall today.  Right hip pain. EXAM: DG HIP (WITH OR WITHOUT PELVIS) 2-3V RIGHT COMPARISON:  None. FINDINGS: The right hip is located. No acute bone or soft tissue abnormalities are present. Calcifications are noted along the hamstring insertion at the ischemic tuberosity. Left total hip arthroplasty is noted. Marked degenerative changes are present in the lower lumbar spine. IMPRESSION: 1. No acute abnormality of the right hip. 2. Mild degenerative changes in the right hip. 3. Left total hip arthroplasty. Electronically Signed   By: San Morelle M.D.   On: 04/10/2017 19:34    Procedures Procedures (including critical care time)  Medications Ordered in ED Medications - No data to display   Initial Impression / Assessment and Plan / ED Course  I have reviewed the triage vital signs and the nursing notes.  Pertinent labs & imaging results that were available during my care of the patient were reviewed by me and considered in my medical decision making (see chart for details).       Final Clinical Impressions(s) / ED Diagnoses MDM Vital signs within normal limits.  X-rays of the elbow are negative for acute fracture or dislocation.  X-rays of the hip and pelvis are negative for acute fracture or dislocation.  There are degenerative changes noted of the right hip.  The patient has a left total  hip replacement, all the hardware appears to be intact.  I have informed the patient and the caregiver of the findings on the exam as well as the findings on the x-rays.  The patient will be treated with Tylenol and ibuprofen for now.  An ice pack will also be given to the patient to use.  The patient will follow up with Dr. Meda Coffee or return  to the emergency department if any changes, problems, or concerns.   Final diagnoses:  Fall  Contusion of right elbow, initial encounter  Contusion of right hip, initial encounter    New Prescriptions New Prescriptions   No medications on file     Annette Stable 04/10/17 2114    Dorie Rank, MD 04/11/17 [phone]

## 2017-06-15 ENCOUNTER — Other Ambulatory Visit: Payer: Self-pay | Admitting: Family Medicine

## 2017-06-15 NOTE — Telephone Encounter (Signed)
Seen [phone] 

## 2017-06-16 ENCOUNTER — Telehealth: Payer: Self-pay | Admitting: Family Medicine

## 2017-06-16 ENCOUNTER — Other Ambulatory Visit: Payer: Self-pay | Admitting: Family Medicine

## 2017-06-16 MED ORDER — VITAMIN D 1000 UNITS PO TABS: 2000.0000 [IU] | ORAL_TABLET | Freq: Every day | ORAL | 3 refills | Status: DC

## 2017-06-16 NOTE — Telephone Encounter (Signed)
Seen [phone] 

## 2017-06-16 NOTE — Telephone Encounter (Signed)
[place] calling to clarify patient's vitamin d rx and aspirin.

## 2017-06-16 NOTE — Telephone Encounter (Signed)
Done

## 2017-06-25 ENCOUNTER — Other Ambulatory Visit: Payer: Self-pay

## 2017-06-25 ENCOUNTER — Ambulatory Visit (INDEPENDENT_AMBULATORY_CARE_PROVIDER_SITE_OTHER): Payer: Medicare Other | Admitting: Family Medicine

## 2017-06-25 ENCOUNTER — Encounter: Payer: Self-pay | Admitting: Family Medicine

## 2017-06-25 VITALS — BP 138/78 | HR 72 | Temp 97.6°F | Resp 18 | Ht 65.0 in | Wt 167.1 lb

## 2017-06-25 DIAGNOSIS — Z1159 Encounter for screening for other viral diseases: Secondary | ICD-10-CM

## 2017-06-25 DIAGNOSIS — F79 Unspecified intellectual disabilities: Secondary | ICD-10-CM | POA: Diagnosis not present

## 2017-06-25 DIAGNOSIS — Z1211 Encounter for screening for malignant neoplasm of colon: Secondary | ICD-10-CM

## 2017-06-25 DIAGNOSIS — Z23 Encounter for immunization: Secondary | ICD-10-CM | POA: Diagnosis not present

## 2017-06-25 DIAGNOSIS — I10 Essential (primary) hypertension: Secondary | ICD-10-CM

## 2017-06-25 DIAGNOSIS — Z0000 Encounter for general adult medical examination without abnormal findings: Secondary | ICD-10-CM

## 2017-06-25 DIAGNOSIS — E119 Type 2 diabetes mellitus without complications: Secondary | ICD-10-CM

## 2017-06-25 DIAGNOSIS — Z114 Encounter for screening for human immunodeficiency virus [HIV]: Secondary | ICD-10-CM

## 2017-06-25 DIAGNOSIS — G300 Alzheimer's disease with early onset: Secondary | ICD-10-CM | POA: Diagnosis not present

## 2017-06-25 DIAGNOSIS — F0280 Dementia in other diseases classified elsewhere without behavioral disturbance: Secondary | ICD-10-CM

## 2017-06-25 NOTE — Patient Instructions (Addendum)
Prevnar 13 today  Due for cologuard (sign form)  Labs due early Feb  See me every six months  Need eye exam once a year

## 2017-06-25 NOTE — Progress Notes (Addendum)
Chief Complaint  Patient presents with  . Annual Exam   Patient is here for his annual physical exam. He has had no accident or significant injury since his last exam.  He did have an emergency room visit in this report is reviewed.  The x-rays are reviewed.  He is recovered from his fall. He has diabetes.  He gets yearly eye exams.  He has regular podiatry visits.  He gets blood work every 6 months.  His diabetes is well controlled. He has hypertension.  He is compliant with medication.  His blood pressures well controlled.  No known complications. He has mental impairment.  He lives in a group home.  He behaves well and is well liked. He is up-to-date with immunizations.  He is getting a Prevnar today.  He is due for shingles, and they are advised to check insurance for coverage. He is never had colon cancer screening.  He is low risk with no known family members with colon cancer, and no colon polyps in his history.  I will order cologuard, and only do colonoscopy if indicated. He has BPH.  He is on medication.  He does not complain of nocturia. He is here with an aide.  She reports that he eats well, sleeps well, is cooperative.  He never complains.    Patient Active Problem List   Diagnosis Date Noted  . Right knee pain 01/01/2017  . Osteoarthritis of right knee 01/01/2017  . Mental impairment 09/23/2016  . Hypertension 09/23/2016  . Type 2 diabetes mellitus without complication, without long-term current use of insulin ([place]) 09/23/2016  . BPH (benign prostatic hyperplasia) 09/23/2016  . Dementia 09/23/2016  . 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 acetaminophen (TYLENOL) 650 MG CR tablet Take 650 mg by mouth 2 (two) times daily.  Marland Kitchen alum & mag hydroxide-simeth (MAALOX PLUS) 400-400-40 MG/5ML suspension Take 10 mLs by mouth 3 (three) times daily as needed for indigestion.  Marland Kitchen amLODipine (NORVASC) 5 MG  tablet TAKE (1) TABLET BY MOUTH ONCE DAILY.  Marland Kitchen ARTHRITIS PAIN RELIEF 650 MG CR tablet TAKE 1 TABLET BY MOUTH TWICE DAILY. **DO NOT CRUSH**  . aspirin EC 81 MG tablet Take 81 mg by mouth daily.  . CHANTAL SUN SCREEN SPF 30 EX Apply topically.  . cholecalciferol (VITAMIN D) 1000 units tablet Take 2 tablets (2,000 Units total) by mouth daily.  Marland Kitchen donepezil (ARICEPT) 10 MG tablet TAKE (1) TABLET BY MOUTH ONCE DAILY.  Marland Kitchen Emollient (VASELINE INTENSIVE CARE EX) Apply topically. Apply twice daily for mild dry skin.  Marland Kitchen FEROSUL 325 (65 Fe) MG tablet TAKE 1 TABLET BY MOUTH TWICE DAILY.  Marland Kitchen FORA LANCETS MISC by Does not apply route. Test once daily  . glucose blood (FORA V30A BLOOD GLUCOSE TEST) test strip 1 each by Other route daily. Use as instructed  . guaiFENesin (ROBITUSSIN) 100 MG/5ML liquid Take 200 mg by mouth 4 (four) times daily as needed for cough.  . hydrochlorothiazide (MICROZIDE) 12.5 MG capsule TAKE 1 CAPSULE BY MOUTH ONCE A DAY.  . hydrocortisone cream 1 % Apply 1 application topically 3 (three) times daily as needed for itching.  . loperamide (IMODIUM) 2 MG capsule Take 4 mg by mouth as needed for diarrhea or loose stools. As needed after second loose stool, max 3 doses.  . magnesium hydroxide (MILK OF MAGNESIA) 400 MG/5ML suspension Take by mouth. Take 30 ml after 3  days with no BM.  . metFORMIN (GLUCOPHAGE) 500 MG tablet TAKE 1 TABLET BY MOUTH TWICE DAILY.  Marland Kitchen neomycin-bacitracin-polymyxin (NEOSPORIN) [phone] ointment Apply topically as needed.  . ramipril (ALTACE) 10 MG capsule TAKE 1 CAPSULE BY MOUTH ONCE DAILY. **DO NOT CRUSH**  . Skin Protectants, Misc. (LIP BALM) STCK Apply topically. As needed to chapped lips  . tamsulosin (FLOMAX) 0.4 MG CAPS capsule TAKE 1 CAPSULE BY MOUTH AT BEDTIME.  Marland Kitchen UNABLE TO FIND Off skintastic as needed for biting insects.   No facility-administered encounter medications on file as of 06/25/2017.     No Known Allergies  Review of Systems    Constitutional: Negative for activity change, appetite change, fatigue and unexpected weight change.  HENT: Negative for congestion and dental problem.        Yearly dental exam  Eyes: Positive for visual disturbance. Negative for photophobia.       Yearly eye exam.  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   Cardiovascular: Negative for chest pain, palpitations and leg swelling.  Gastrointestinal: Negative for blood in stool, constipation and diarrhea.  Genitourinary: Negative for difficulty urinating and frequency.  Musculoskeletal: Positive for arthralgias and gait problem.       Right knee arthritis-observed to limp but does not complain of pain.  Neurological: Negative for dizziness and headaches.  Psychiatric/Behavioral: Negative for dysphoric mood and sleep disturbance.       No behaviors  All other systems reviewed and are negative.    BP 138/78 (BP Location: Left Arm, Patient Position: Sitting, Cuff Size: Normal)   Pulse 72   Temp 97.6 F (36.4 C) (Temporal)   Resp 18   Ht 5\' 5"  (1.651 m)   Wt 167 lb 1.9 oz (75.8 kg)   SpO2 99%   BMI 27.81 kg/m   Physical Exam   BP 138/78 (BP Location: Left Arm, Patient Position: Sitting, Cuff Size: Normal)   Pulse 72   Temp 97.6 F (36.4 C) (Temporal)   Resp 18   Ht 5\' 5"  (1.651 m)   Wt 167 lb 1.9 oz (75.8 kg)   SpO2 99%   BMI 27.81 kg/m   General Appearance:    Alert, cooperative, no distress, appears stated age.  Childlike demeanor.  Cooperative  Head:    Normocephalic, without obvious abnormality, atraumatic  Eyes:    conjunctiva/corneas clear, EOM's intact, fundus    benign, left eye.  Right eye clouded, not responsive  Ears:    Normal TM's and external ear canals, both ears  Nose:   Nares normal, septum midline, mucosa normal, no drainage   or sinus tenderness  Throat:   Lips, mucosa, and tongue normal; teeth well repaired.  Partial upper and lower plates in place.  And  gums normal  Neck:   Supple, symmetrical, trachea midline, no adenopathy;       thyroid:  No enlargement/tenderness/nodules; no carotid   bruit   Back:     Symmetric, no curvature, ROM normal, no CVA tenderness.  Slight kyphotic posture  Lungs:     Clear to auscultation bilaterally, respirations unlabored  Chest wall:    No tenderness or deformity  Heart:    Regular rate and rhythm, S1 and S2 normal, no murmur, rub   or gallop  Abdomen:     Soft, non-tender, bowel sounds active all four quadrants,    no masses, no organomegaly  Genitalia:    Normal male without lesion, discharge or  tenderness.  Small umbilical hernia.  Umbilical hygiene discussed.  Rectal:   Deferred  Extremities:   Extremities normal, atraumatic, no cyanosis, 1+ edema.  Pes planus.  Toes painted with gentian violet.  Dystrophic nails.  Pulses:   2+ and symmetric all extremities  Skin:   Skin color, texture, turgor normal, no rashes or lesions.  No ulcers blisters or skin breakdown on feet.  Lymph nodes:   Cervical, supraclavicular, and axillary nodes normal  Neurologic:   CNII-XII intact. Normal strength, sensation and reflexes      Throughout.  Normal fiber testing 5/5 location on feet.  Normal diabetic foot exam     ASSESSMENT/PLAN:  1. Type 2 diabetes mellitus without complication, without long-term current use of insulin ([facility]) - COMPLETE METABOLIC PANEL WITH GFR - Hemoglobin A1c - CBC - Lipid panel - Urinalysis, Routine w reflex microscopic  2. Essential hypertension  3. Early onset Alzheimer's dementia without behavioral disturbance  4. Mental impairment  5. Physical exam, annual  6. Encounter for hepatitis C screening test for low risk patient - Hepatitis C antibody  7. Screening for HIV (human immunodeficiency virus) - HIV antibody  8. Screen for colon cancer - Cologuard  9. Need for pneumococcal vaccination - Pneumococcal conjugate vaccine 13-valent IM -As patient lives in a group home he  required additional paperwork documenting his visit, his needs, his orders, several pages of medications, and a handwritten physical examination as well as the typewritten report.  Patient Instructions  Prevnar 13 today  Due for cologuard (sign form)  Labs due early Feb  See me every six months  Need eye exam once a year   Raylene Everts, MD

## 2017-08-17 ENCOUNTER — Encounter: Payer: Self-pay | Admitting: Family Medicine

## 2017-10-15 ENCOUNTER — Other Ambulatory Visit: Payer: Self-pay | Admitting: Family Medicine

## 2017-12-11 ENCOUNTER — Other Ambulatory Visit: Payer: Self-pay | Admitting: Family Medicine

## 2017-12-24 ENCOUNTER — Ambulatory Visit: Payer: Medicare Other | Admitting: Family Medicine

## 2018-01-05 ENCOUNTER — Other Ambulatory Visit: Payer: Self-pay | Admitting: Family Medicine

## 2019-11-30 ENCOUNTER — Encounter: Payer: Self-pay | Admitting: Internal Medicine

## 2019-12-07 ENCOUNTER — Other Ambulatory Visit (HOSPITAL_COMMUNITY): Payer: Self-pay | Admitting: Neurology

## 2019-12-07 DIAGNOSIS — F0390 Unspecified dementia without behavioral disturbance: Secondary | ICD-10-CM

## 2020-01-05 ENCOUNTER — Other Ambulatory Visit (HOSPITAL_COMMUNITY): Payer: Self-pay | Admitting: Neurology

## 2020-01-05 ENCOUNTER — Observation Stay (HOSPITAL_COMMUNITY)
Admission: EM | Admit: 2020-01-05 | Discharge: 2020-01-06 | Disposition: A | Discharge status: Home / Self Care | Payer: Medicare Other | Attending: Family Medicine | Admitting: Family Medicine

## 2020-01-05 ENCOUNTER — Ambulatory Visit (HOSPITAL_COMMUNITY)
Admission: RE | Admit: 2020-01-05 | Discharge: 2020-01-05 | Disposition: A | Discharge status: Home / Self Care | Payer: Medicare Other | Source: Ambulatory Visit | Attending: Neurology | Admitting: Neurology

## 2020-01-05 ENCOUNTER — Other Ambulatory Visit: Payer: Self-pay

## 2020-01-05 ENCOUNTER — Observation Stay (HOSPITAL_COMMUNITY): Payer: Medicare Other

## 2020-01-05 ENCOUNTER — Encounter (HOSPITAL_COMMUNITY): Payer: Self-pay | Admitting: Emergency Medicine

## 2020-01-05 ENCOUNTER — Observation Stay (HOSPITAL_BASED_OUTPATIENT_CLINIC_OR_DEPARTMENT_OTHER): Payer: Medicare Other

## 2020-01-05 DIAGNOSIS — I6389 Other cerebral infarction: Secondary | ICD-10-CM | POA: Diagnosis not present

## 2020-01-05 DIAGNOSIS — R338 Other retention of urine: Secondary | ICD-10-CM

## 2020-01-05 DIAGNOSIS — I639 Cerebral infarction, unspecified: Principal | ICD-10-CM | POA: Diagnosis present

## 2020-01-05 DIAGNOSIS — Z7982 Long term (current) use of aspirin: Secondary | ICD-10-CM | POA: Insufficient documentation

## 2020-01-05 DIAGNOSIS — I10 Essential (primary) hypertension: Secondary | ICD-10-CM | POA: Diagnosis present

## 2020-01-05 DIAGNOSIS — Z20822 Contact with and (suspected) exposure to covid-19: Secondary | ICD-10-CM | POA: Diagnosis not present

## 2020-01-05 DIAGNOSIS — N401 Enlarged prostate with lower urinary tract symptoms: Secondary | ICD-10-CM | POA: Diagnosis not present

## 2020-01-05 DIAGNOSIS — I6310 Cerebral infarction due to embolism of unspecified precerebral artery: Secondary | ICD-10-CM

## 2020-01-05 DIAGNOSIS — N400 Enlarged prostate without lower urinary tract symptoms: Secondary | ICD-10-CM | POA: Diagnosis present

## 2020-01-05 DIAGNOSIS — Z79899 Other long term (current) drug therapy: Secondary | ICD-10-CM | POA: Insufficient documentation

## 2020-01-05 DIAGNOSIS — F0390 Unspecified dementia without behavioral disturbance: Secondary | ICD-10-CM

## 2020-01-05 DIAGNOSIS — Z7984 Long term (current) use of oral hypoglycemic drugs: Secondary | ICD-10-CM | POA: Diagnosis not present

## 2020-01-05 DIAGNOSIS — E119 Type 2 diabetes mellitus without complications: Secondary | ICD-10-CM | POA: Diagnosis not present

## 2020-01-05 HISTORY — DX: Pure hypercholesterolemia, unspecified: E78.00

## 2020-01-05 LAB — DIFFERENTIAL
Abs Immature Granulocytes: 0.02 10*3/uL (ref 0.00–0.07)
Basophils Absolute: 0 10*3/uL (ref 0.0–0.1)
Basophils Relative: 0 %
Eosinophils Absolute: 0 10*3/uL (ref 0.0–0.5)
Eosinophils Relative: 0 %
Immature Granulocytes: 0 %
Lymphocytes Relative: 14 %
Lymphs Abs: 0.7 10*3/uL (ref 0.7–4.0)
Monocytes Absolute: 0.3 10*3/uL (ref 0.1–1.0)
Monocytes Relative: 7 %
Neutro Abs: 4.1 10*3/uL (ref 1.7–7.7)
Neutrophils Relative %: 79 %

## 2020-01-05 LAB — URINALYSIS, ROUTINE W REFLEX MICROSCOPIC
Bilirubin Urine: NEGATIVE
Glucose, UA: >=500 mg/dL — AB
Hgb urine dipstick: NEGATIVE
Ketones, ur: NEGATIVE mg/dL
Leukocytes,Ua: NEGATIVE
Nitrite: NEGATIVE
Protein, ur: NEGATIVE mg/dL
Specific Gravity, Urine: 1.02 (ref 1.005–1.030)
pH: 5.5 (ref 5.0–8.0)

## 2020-01-05 LAB — COMPREHENSIVE METABOLIC PANEL
ALT: 17 U/L (ref 0–44)
AST: 14 U/L — ABNORMAL LOW (ref 15–41)
Albumin: 3.9 g/dL (ref 3.5–5.0)
Alkaline Phosphatase: 66 U/L (ref 38–126)
Anion gap: 12 (ref 5–15)
BUN: 21 mg/dL (ref 8–23)
CO2: 30 mmol/L (ref 22–32)
Calcium: 10.2 mg/dL (ref 8.9–10.3)
Chloride: 93 mmol/L — ABNORMAL LOW (ref 98–111)
Creatinine, Ser: 1.35 mg/dL — ABNORMAL HIGH (ref 0.61–1.24)
GFR calc Af Amer: >60 mL/min (ref >60)
GFR calc non Af Amer: 54 mL/min — ABNORMAL LOW (ref >60)
Glucose, Bld: 218 mg/dL — ABNORMAL HIGH (ref 70–99)
Potassium: 3.5 mmol/L (ref 3.5–5.1)
Sodium: 135 mmol/L (ref 135–145)
Total Bilirubin: 0.4 mg/dL (ref 0.3–1.2)
Total Protein: 7.2 g/dL (ref 6.5–8.1)

## 2020-01-05 LAB — CBC
HCT: 38.9 % — ABNORMAL LOW (ref 39.0–52.0)
Hemoglobin: 12.5 g/dL — ABNORMAL LOW (ref 13.0–17.0)
MCH: 28.7 pg (ref 26.0–34.0)
MCHC: 32.1 g/dL (ref 30.0–36.0)
MCV: 89.2 fL (ref 80.0–100.0)
Platelets: 200 10*3/uL (ref 150–400)
RBC: 4.36 MIL/uL (ref 4.22–5.81)
RDW: 13.2 % (ref 11.5–15.5)
WBC: 5.2 10*3/uL (ref 4.0–10.5)
nRBC: 0 % (ref 0.0–0.2)

## 2020-01-05 LAB — GLUCOSE, CAPILLARY: Glucose-Capillary: 63 mg/dL — ABNORMAL LOW (ref 70–99)

## 2020-01-05 LAB — SARS CORONAVIRUS 2 BY RT PCR (HOSPITAL ORDER, PERFORMED IN ~~LOC~~ HOSPITAL LAB): SARS Coronavirus 2: NEGATIVE

## 2020-01-05 LAB — ECHOCARDIOGRAM COMPLETE
Area-P 1/2: 1.98 cm2
Height: 68 in
S' Lateral: 2.53 cm
Weight: 2720 oz

## 2020-01-05 LAB — HIV ANTIBODY (ROUTINE TESTING W REFLEX): HIV Screen 4th Generation wRfx: NONREACTIVE

## 2020-01-05 LAB — ETHANOL: Alcohol, Ethyl (B): <10 mg/dL (ref <10)

## 2020-01-05 LAB — APTT: aPTT: 26 seconds (ref 24–36)

## 2020-01-05 LAB — URINALYSIS, MICROSCOPIC (REFLEX)

## 2020-01-05 LAB — RAPID URINE DRUG SCREEN, HOSP PERFORMED
Amphetamines: NOT DETECTED
Barbiturates: NOT DETECTED
Benzodiazepines: NOT DETECTED
Cocaine: NOT DETECTED
Opiates: NOT DETECTED
Tetrahydrocannabinol: NOT DETECTED

## 2020-01-05 LAB — HEMOGLOBIN A1C
Hgb A1c MFr Bld: 7.7 % — ABNORMAL HIGH (ref 4.8–5.6)
Mean Plasma Glucose: 174.29 mg/dL

## 2020-01-05 LAB — PROTIME-INR
INR: 1 (ref 0.8–1.2)
Prothrombin Time: 12.9 seconds (ref 11.4–15.2)

## 2020-01-05 LAB — CBG MONITORING, ED: Glucose-Capillary: 176 mg/dL — ABNORMAL HIGH (ref 70–99)

## 2020-01-05 IMAGING — US US CAROTID DUPLEX BILAT
1 series · 13 of 24 positions shown · non-contrast
Comparison: None.

CLINICAL DATA: 68-year-old male with a history stroke

EXAM:
BILATERAL CAROTID DUPLEX ULTRASOUND
TECHNIQUE: Gray scale imaging, color Doppler and duplex ultrasound were
performed of bilateral carotid and vertebral arteries in the neck.

[Series 1: us carotid bilateral · 13 of 70 slices shown]
[im 1/70]
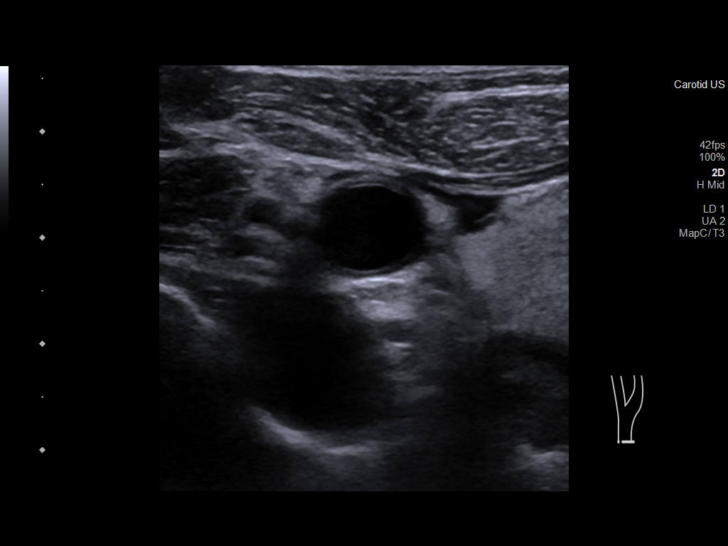
[im 7/70]
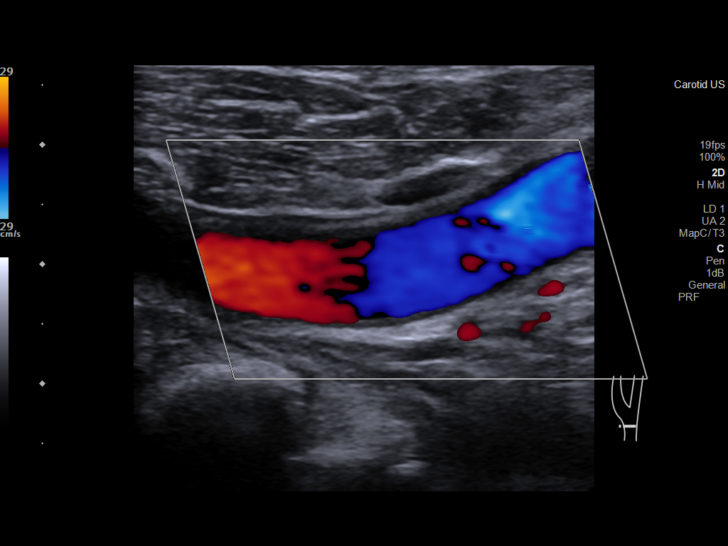
[im 13/70]
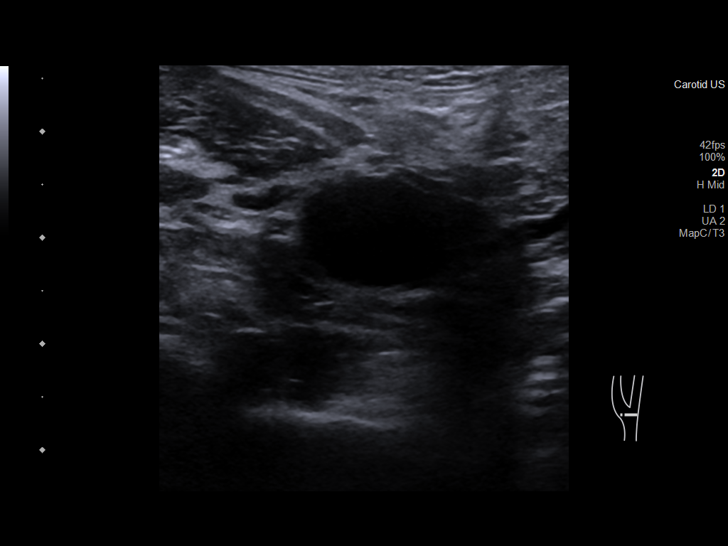
[im 19/70]
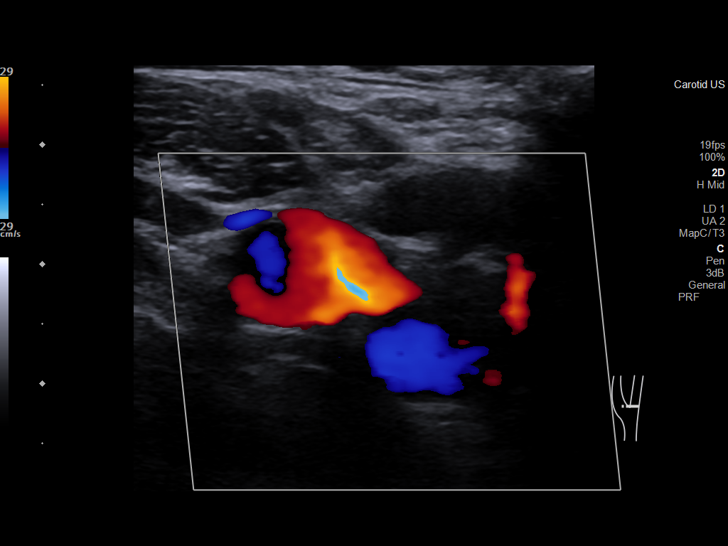
[im 25/70]
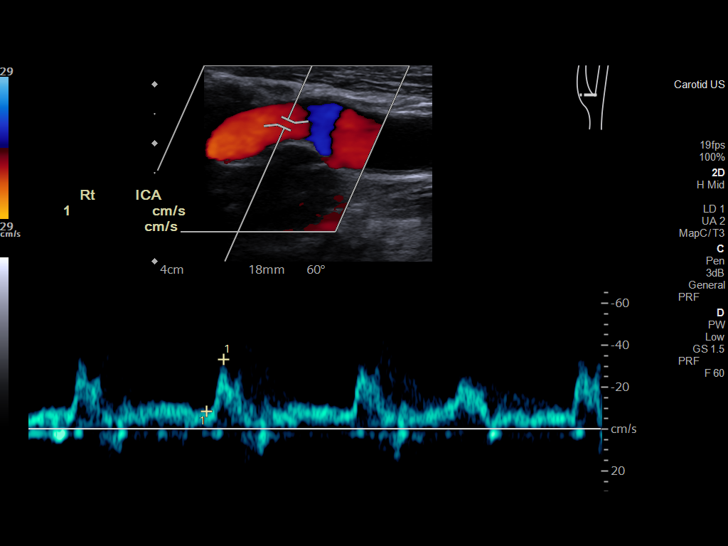
[im 31/70]
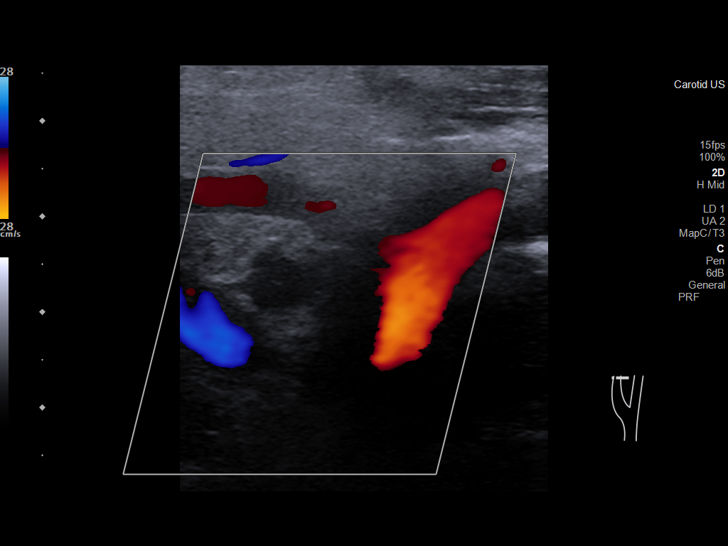
[im 37/70]
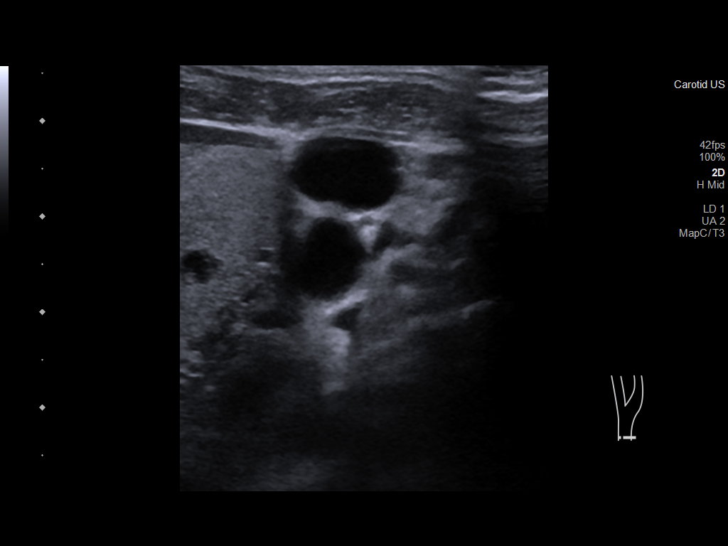
[im 40/70]
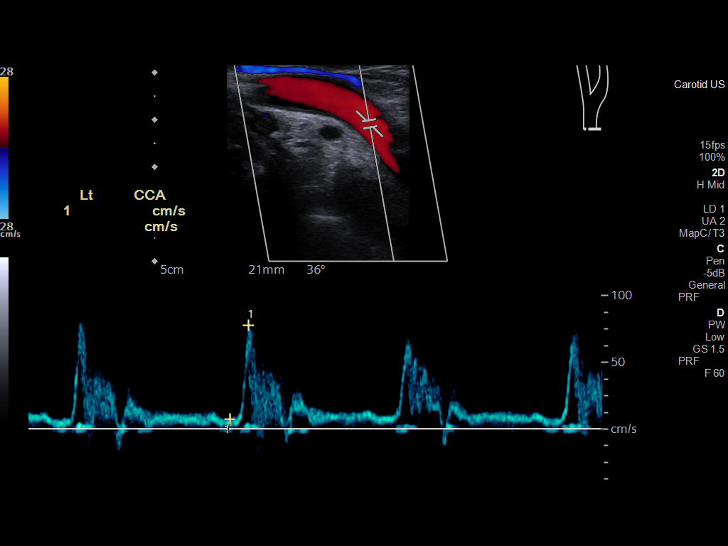
[im 46/70]
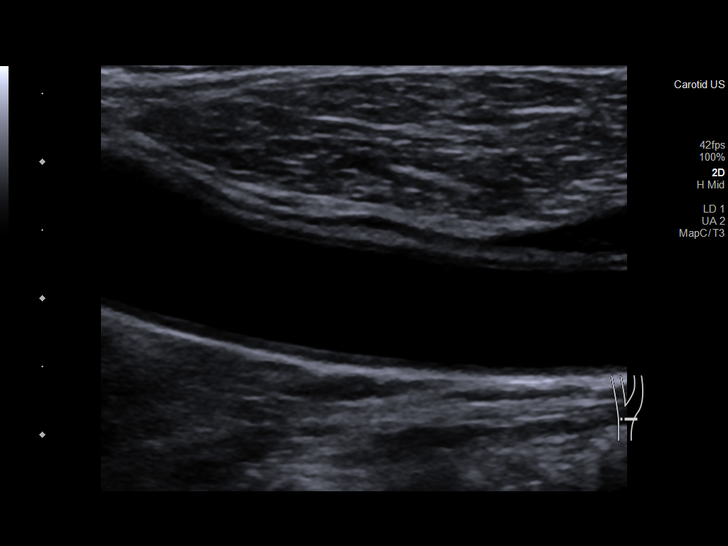
[im 52/70]
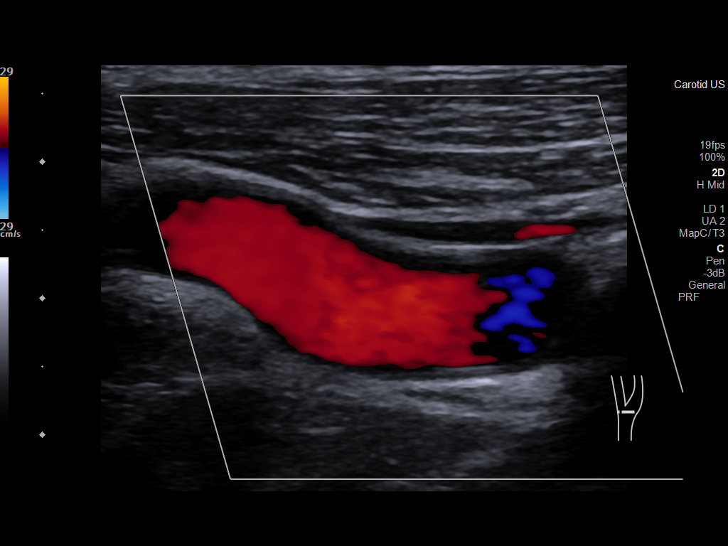
[im 58/70]
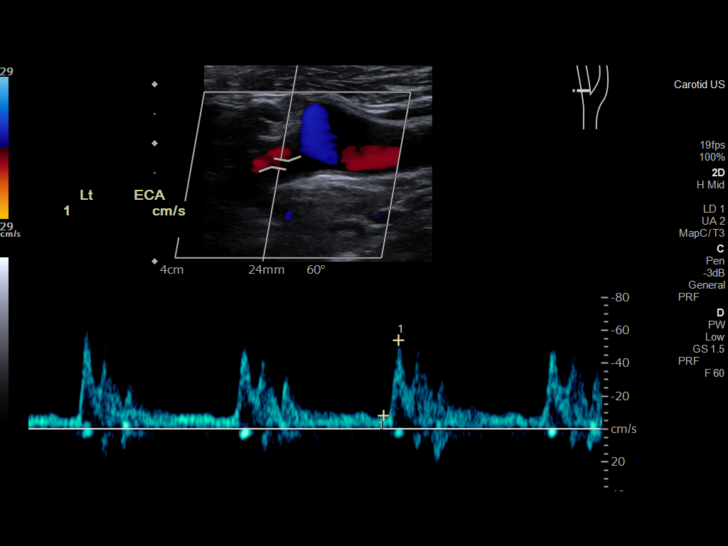
[im 64/70]
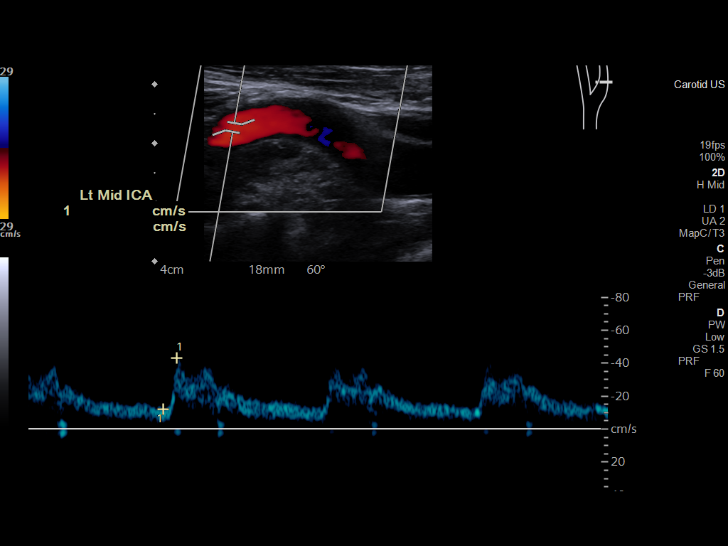
[im 70/70]
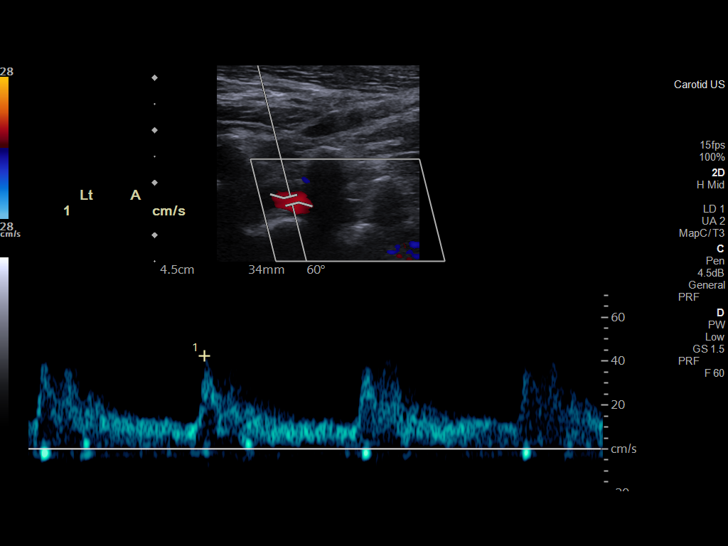

[13 of 24 positions shown; findings below may reference images not displayed]

FINDINGS: Criteria: Quantification of carotid stenosis is based on velocity
parameters that correlate the residual internal carotid diameter
with NASCET-based stenosis levels, using the diameter of the distal
internal carotid lumen as the denominator for stenosis measurement.

The following velocity measurements were obtained:

RIGHT

ICA:  Systolic 66 cm/sec, Diastolic 615 cm/sec

CCA:  64 cm/sec

SYSTOLIC ICA/CCA RATIO:

ECA:  46 cm/sec

LEFT

ICA:  Systolic 70 cm/sec, Diastolic 17 cm/sec

CCA:  83 cm/sec

SYSTOLIC ICA/CCA RATIO:

ECA:  54 cm/sec

Right Brachial SBP: Not acquired

Left Brachial SBP: Not acquired

RIGHT CAROTID ARTERY: No significant calcifications of the right
common carotid artery. Intermediate waveform maintained.
Heterogeneous and partially calcified plaque at the right carotid
bifurcation. No significant lumen shadowing. Low resistance waveform
of the right ICA. No significant tortuosity.

RIGHT VERTEBRAL ARTERY: Antegrade flow with low resistance waveform.

LEFT CAROTID ARTERY: No significant calcifications of the left
common carotid artery. Intermediate waveform maintained.
Heterogeneous and partially calcified plaque at the left carotid
bifurcation without significant lumen shadowing. Low resistance
waveform of the left ICA. No significant tortuosity.

LEFT VERTEBRAL ARTERY:  Antegrade flow with low resistance waveform.
IMPRESSION: Color duplex indicates minimal heterogeneous and calcified plaque,
with no hemodynamically significant stenosis by duplex criteria in
the extracranial cerebrovascular circulation.

## 2020-01-05 IMAGING — MR MR HEAD W/O CM
13 of 15 series · 30 of 48 positions shown · non-contrast
Comparison: [DATE] MRI head.

CLINICAL DATA: Altered mental status and weakness.

EXAM:
MRI HEAD WITHOUT CONTRAST
MRA HEAD WITHOUT CONTRAST
TECHNIQUE: Multiplanar, multiecho pulse sequences of the brain and surrounding
structures were obtained without intravenous contrast. Angiographic
images of the head were obtained using MRA technique without
contrast.

[Series 5: DWI · axial · 3.0mm · 0.88mm/px · z∈[-123,-2]mm · 2 of 42 slices shown (1 of 6)]
[im 1/42]
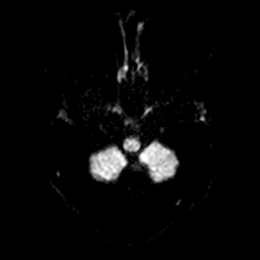
[im 42/42]
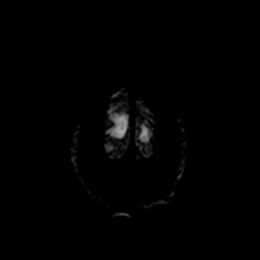

[Series 5: DWI · axial · 3.0mm · 0.88mm/px · z∈[-123,-2]mm · 2 of 42 slices shown (2 of 6)]
[im 1/42]
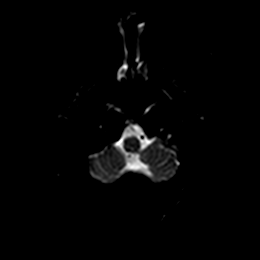
[im 42/42]
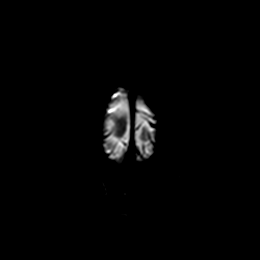

[Series 6: DWI · axial · 3.0mm · 0.88mm/px · z∈[-123,-2]mm · 2 of 42 slices shown (3 of 6)]
[im 1/42]
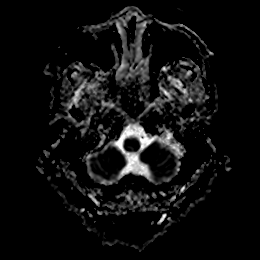
[im 42/42]
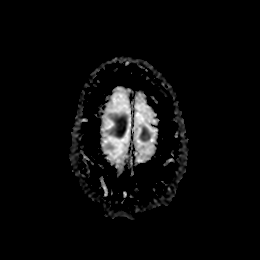

[Series 7: DWI · coronal · 4.0mm · 0.88mm/px · 1 of 32 slices shown (4 of 6)]
[im 1/32]
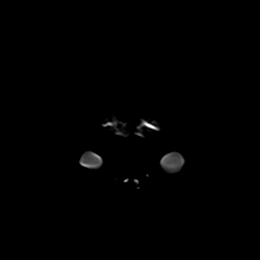

[Series 7: DWI · coronal · 4.0mm · 0.88mm/px · 2 of 32 slices shown (5 of 6)]
[im 1/32]
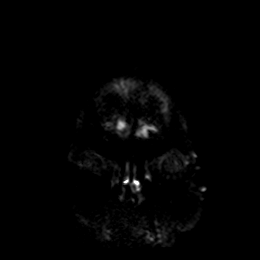
[im 32/32]
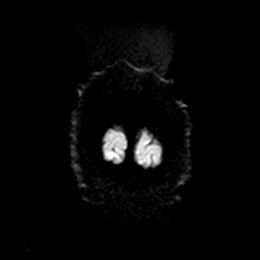

[Series 8: DWI · coronal · 4.0mm · 0.88mm/px · 2 of 32 slices shown (6 of 6)]
[im 1/32]
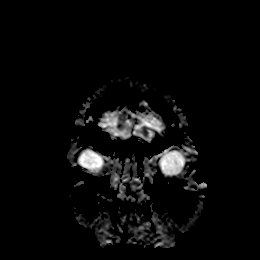
[im 32/32]
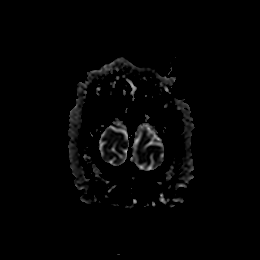

[Series 9: T1 · sagittal · 5.0mm · 0.75mm/px · 1 of 23 slices shown]
[im 1/23]
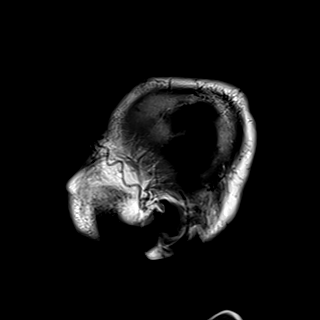

[Series 10: T2 · axial · 5.0mm · 0.72mm/px · z∈[-134,+9]mm · 2 of 25 slices shown (1 of 2)]
[im 1/25]
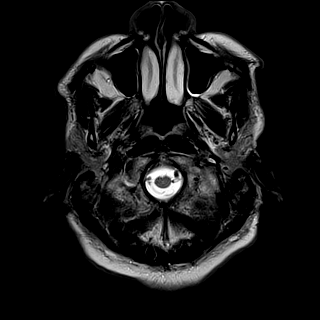
[im 25/25]
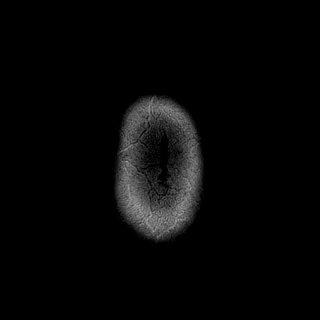

[Series 11: mag_images · axial · 3.0mm · 0.90mm/px · z∈[-147,+27]mm · 4 of 60 slices shown]
[im 1/60]
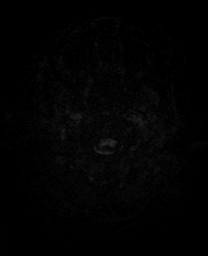
[im 20/60]
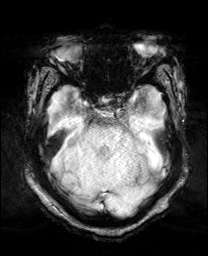
[im 40/60]
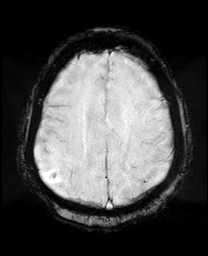
[im 60/60]
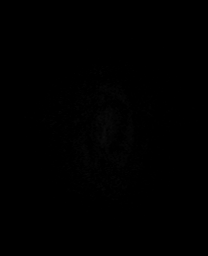

[Series 12: pha_images · axial · 3.0mm · 0.90mm/px · z∈[-147,+27]mm · 4 of 60 slices shown]
[im 1/60]
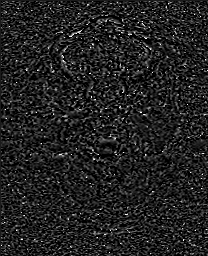
[im 20/60]
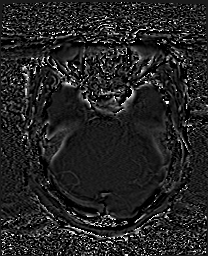
[im 40/60]
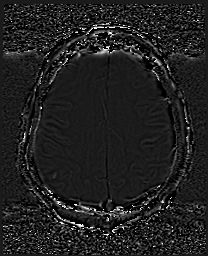
[im 60/60]
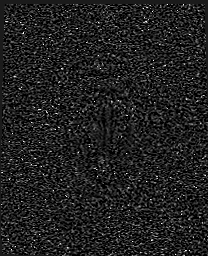

[Series 13: swi_images · axial · 3.0mm · 0.90mm/px · z∈[-147,+27]mm · 4 of 60 slices shown]
[im 1/60]
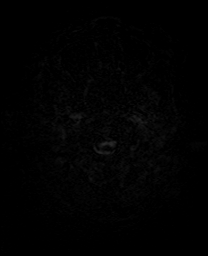
[im 20/60]
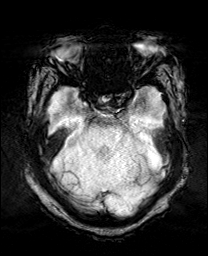
[im 40/60]
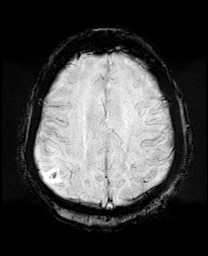
[im 60/60]
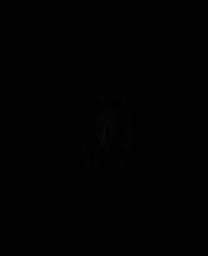

[Series 15: FLAIR · axial · 5.0mm · 0.45mm/px · z∈[-131,+11]mm · 2 of 25 slices shown]
[im 1/25]
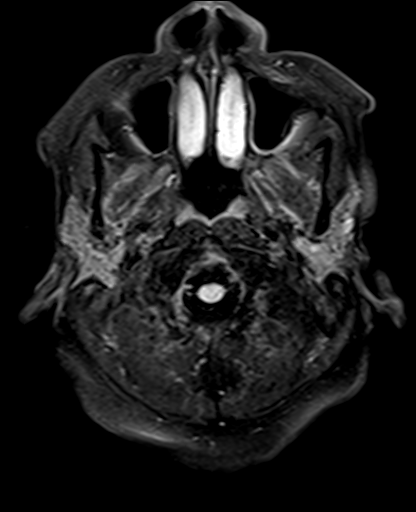
[im 25/25]
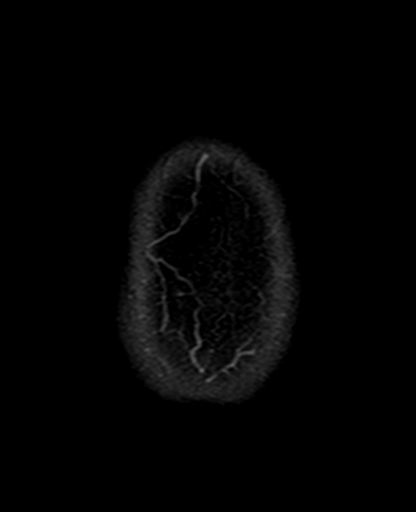

[Series 17: T2 · coronal · 5.0mm · 0.34mm/px · 2 of 26 slices shown (2 of 2)]
[im 1/26]
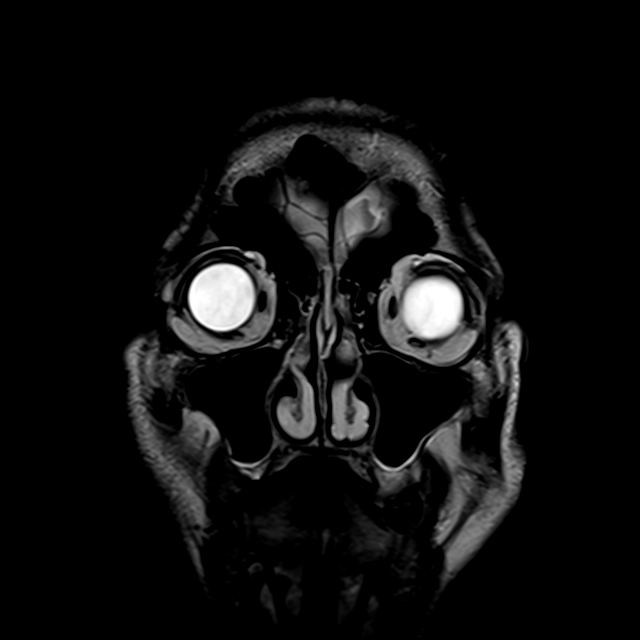
[im 26/26]
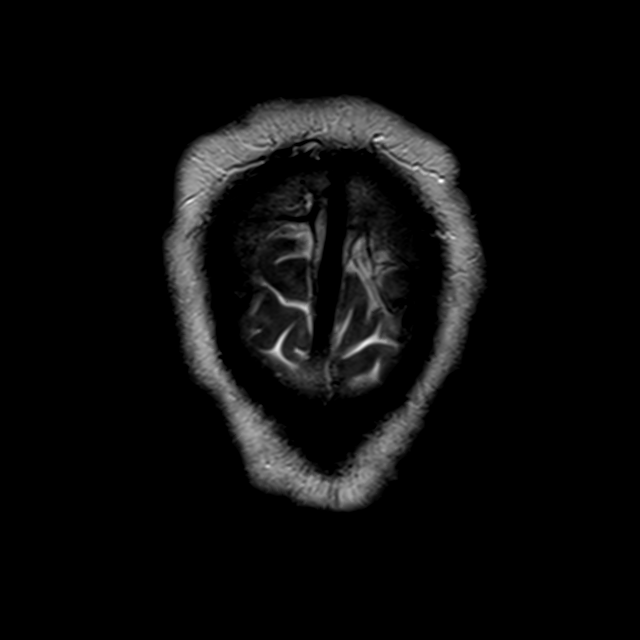

[30 of 48 positions shown; findings below may reference images not displayed]

FINDINGS: MRI HEAD FINDINGS

Brain: Multifocal acute infarcts involving the bilateral parietal
lobes, dorsal right corona radiata and dorsal right insula.
Associated SWI signal dropout within the right cerebrum reflects
hemosiderin deposition.

Background punctate scattered T2/FLAIR hyperintense foci involving
the supratentorial white matter. Small remote left basal ganglia
lacunar insult.

Mild diffuse cerebral atrophy with ex vacuo dilatation. No midline
shift or extra-axial fluid collection. No mass lesion.

Skull and upper cervical spine: Normal marrow signal. Upper cervical
spondylosis with spinal canal narrowing at the C2-3 level.

Sinuses/Orbits: Normal orbits. Sequela of right lens replacement.
Clear paranasal sinuses. No mastoid effusion.

Other: None.

MRA HEAD FINDINGS

Anterior circulation: Fenestrated anterior communicating artery. No
significant stenosis, proximal occlusion, aneurysm, or vascular
malformation.

Posterior circulation: Dominant left vertebral artery and right
AICA. Short-segment high-grade narrowing involving the right V4
segment ([DATE]). Vessel wall irregularity involving the right
vertebral artery likely reflects atherosclerotic disease. No
proximal occlusion, aneurysm, or vascular malformation.

Venous sinuses: No evidence of thrombosis.

Anatomic variants: Fetal origin of the left PCA.
IMPRESSION: Multifocal acute infarcts involving the bilateral parietal lobes,
dorsal right corona radiata and dorsal right insular cortex.

Remote left basal ganglia lacunar infarct.

Minimal chronic microvascular ischemic changes. Mild cerebral
atrophy.

Short-segment high-grade narrowing of the right V4 segment.
Atherosclerotic disease.

These results were called by telephone at the time of interpretation
on [DATE] at [DATE] to provider AW , who verbally
acknowledged these results.

## 2020-01-05 MED ORDER — TAMSULOSIN HCL 0.4 MG PO CAPS
0.4000 mg | ORAL_CAPSULE | Freq: Every day | ORAL | Status: DC
  Administered 2020-01-05: 0.4 mg via ORAL
  Filled 2020-01-05: qty 1

## 2020-01-05 MED ORDER — INSULIN ASPART 100 UNIT/ML ~~LOC~~ SOLN
0.0000 [IU] | Freq: Three times a day (TID) | SUBCUTANEOUS | Status: DC
  Administered 2020-01-05: 2 [IU] via SUBCUTANEOUS
  Administered 2020-01-06: 5 [IU] via SUBCUTANEOUS
  Administered 2020-01-06: 2 [IU] via SUBCUTANEOUS
  Filled 2020-01-05: qty 1

## 2020-01-05 MED ORDER — ATORVASTATIN CALCIUM 40 MG PO TABS
40.0000 mg | ORAL_TABLET | Freq: Every day | ORAL | Status: DC
  Administered 2020-01-05 – 2020-01-06 (×2): 40 mg via ORAL
  Filled 2020-01-05 (×2): qty 1

## 2020-01-05 MED ORDER — ASPIRIN EC 81 MG PO TBEC
81.0000 mg | DELAYED_RELEASE_TABLET | Freq: Every day | ORAL | Status: DC
  Administered 2020-01-05 – 2020-01-06 (×2): 81 mg via ORAL
  Filled 2020-01-05 (×2): qty 1

## 2020-01-05 MED ORDER — STROKE: EARLY STAGES OF RECOVERY BOOK
Freq: Once | Status: DC
  Filled 2020-01-05: qty 1

## 2020-01-05 MED ORDER — ENOXAPARIN SODIUM 40 MG/0.4ML ~~LOC~~ SOLN: 40.0000 mg | SUBCUTANEOUS | Status: DC

## 2020-01-05 MED ORDER — ACETAMINOPHEN 160 MG/5ML PO SOLN: 650.0000 mg | ORAL | Status: DC | PRN

## 2020-01-05 MED ORDER — SODIUM CHLORIDE 0.9 % IV SOLN: INTRAVENOUS | Status: DC

## 2020-01-05 MED ORDER — ACETAMINOPHEN 325 MG PO TABS
650.0000 mg | ORAL_TABLET | Freq: Two times a day (BID) | ORAL | Status: DC
  Administered 2020-01-05 – 2020-01-06 (×2): 650 mg via ORAL
  Filled 2020-01-05 (×5): qty 2

## 2020-01-05 MED ORDER — INSULIN ASPART 100 UNIT/ML ~~LOC~~ SOLN: 0.0000 [IU] | Freq: Every day | SUBCUTANEOUS | Status: DC

## 2020-01-05 MED ORDER — FERROUS SULFATE 325 (65 FE) MG PO TABS
325.0000 mg | ORAL_TABLET | Freq: Two times a day (BID) | ORAL | Status: DC
  Administered 2020-01-05 – 2020-01-06 (×3): 325 mg via ORAL
  Filled 2020-01-05 (×3): qty 1

## 2020-01-05 MED ORDER — DONEPEZIL HCL 5 MG PO TABS
10.0000 mg | ORAL_TABLET | Freq: Every day | ORAL | Status: DC
  Administered 2020-01-05 – 2020-01-06 (×2): 10 mg via ORAL
  Filled 2020-01-05: qty 2
  Filled 2020-01-05: qty 1

## 2020-01-05 MED ORDER — CLOPIDOGREL BISULFATE 75 MG PO TABS
75.0000 mg | ORAL_TABLET | Freq: Every day | ORAL | Status: DC
  Administered 2020-01-05 – 2020-01-06 (×2): 75 mg via ORAL
  Filled 2020-01-05 (×2): qty 1

## 2020-01-05 MED ORDER — ACETAMINOPHEN 325 MG PO TABS: 650.0000 mg | ORAL_TABLET | ORAL | Status: DC | PRN

## 2020-01-05 MED ORDER — ACETAMINOPHEN 650 MG RE SUPP: 650.0000 mg | RECTAL | Status: DC | PRN

## 2020-01-05 MED ORDER — SENNOSIDES-DOCUSATE SODIUM 8.6-50 MG PO TABS: 1.0000 | ORAL_TABLET | Freq: Every evening | ORAL | Status: DC | PRN

## 2020-01-05 NOTE — ED Triage Notes (Signed)
Pt brought over from MRI. Pt resident of [facility], was referred to outpatient MRI, related to dementia dx. Per radiologist pt has "new strokes" seen on scan Dr. Merlene Laughter notified and reported for pt to be brought to ED for further evaluation. Per facility staff, pt received xanax prior to MRI.  Pt at baseline per facility, pt alert and typically conversational. Pt denies any complaints at this time and is alert to self, place, situation. Pt able to follow commands, sensation intact, drifts absent, facial symmetry noted.

## 2020-01-05 NOTE — H&P (Signed)
History and Physical    Daniel [name] DOB: November 21, 1950 DOA: 01/05/2020  PCP: Leonie Douglas, MD  Patient coming from: Group home  I have personally briefly reviewed patient's old medical records in [place]  Chief Complaint: Acute CVA on MRI  HPI: Daniel [name] is a 69 y.o. 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  According to the history provided to me by ER physician, patient was being worked up for early onset dementia as outpatient.  His neurologist Dr. Merlene Laughter had ordered MRI.  Patient went for MRI today and he was found to have acute stroke on that so Dr. Merlene Laughter diverted this patient to the emergency department for admission for further stroke work-up.    ED Course: Upon arrival to ED, patient was slightly confused only because he received Xanax prior to MRI.  He was hemodynamically stable.  Basic labs such as CBC, UA, ethanol, BMP were all negative including COVID-19.  Hospital service were consulted to admit the patient while neurologist Dr. Merlene Laughter will consult.  When I saw patient in the ED, group homes manager was present at the bedside.  She failed me with information that patient has been living at a group home for more than 16 years.  Patient was slightly confused earlier due to Xanax as mentioned above however he is coming back to his baseline now.  Currently he is following all commands.  At baseline, he does not know the month, the year.  Apparently patient has 2 younger sisters who live in town however recently, group home has been having hard time getting hold of them.  Currently patient is alert.  He knows he is in the hospital.  This is his baseline.  And he has no complaints.  Review of Systems: As per HPI otherwise negative.    Past Medical  History:  Diagnosis Date  . Diabetes mellitus without complication ([place])   . High cholesterol   . Hypertension     Past Surgical History:  Procedure Laterality Date  . HIP SURGERY     left  . KIDNEY SURGERY       reports that he has never smoked. He has never used smokeless tobacco. He reports that he does not drink alcohol and does not use drugs.  No Known Allergies  Family History  Family history unknown: Yes    Prior to Admission medications   Medication Sig Start Date End Date Taking? Authorizing Provider  acetaminophen (TYLENOL) 650 MG CR tablet Take 650 mg by mouth 2 (two) times daily.   Yes [provider]  amLODipine (NORVASC) 5 MG tablet TAKE (1) TABLET BY MOUTH ONCE DAILY. Patient taking differently: Take 10 mg by mouth daily.  06/15/17  Yes Raylene Everts, MD  ARTHRITIS PAIN RELIEF 650 MG CR tablet TAKE 1 TABLET BY MOUTH TWICE DAILY. **DO NOT CRUSH** 06/15/17  Yes Raylene Everts, MD  aspirin EC 81 MG tablet Take 81 mg by mouth daily.   Yes [provider]  Cholecalciferol (VITAMIN D3) 2000 units capsule TAKE 1 CAPSULE BY MOUTH ONCE DAILY. **DO NOT CRUSH** 01/06/18  Yes Fayrene Helper, MD  donepezil (ARICEPT) 10 MG tablet TAKE (1) TABLET BY MOUTH ONCE DAILY. 06/15/17  Yes Raylene Everts, MD  FORA LANCETS MISC by Does not  apply route. Test once daily   Yes [provider]  glucose blood (FORA V30A BLOOD GLUCOSE TEST) test strip 1 each by Other route daily. Use as instructed   Yes [provider]  hydrochlorothiazide (MICROZIDE) 12.5 MG capsule TAKE 1 CAPSULE BY MOUTH ONCE A DAY. 06/15/17  Yes Raylene Everts, MD  hydrocortisone cream 1 % Apply 1 application topically 3 (three) times daily as needed for itching.   Yes [provider]  memantine (NAMENDA) 5 MG tablet Take 1 tablet by mouth daily. 12/08/19  Yes [provider]  metFORMIN (GLUCOPHAGE) 500 MG tablet TAKE 1 TABLET BY MOUTH TWICE DAILY. 06/15/17  Yes  Raylene Everts, MD  oxybutynin (DITROPAN-XL) 5 MG 24 hr tablet Take 5 mg by mouth daily. 01/04/20  Yes [provider]  ramipril (ALTACE) 10 MG capsule TAKE 1 CAPSULE BY MOUTH ONCE DAILY. **DO NOT CRUSH** 06/15/17  Yes Raylene Everts, MD  tamsulosin [facility]) 0.4 MG CAPS capsule TAKE 1 CAPSULE BY MOUTH AT BEDTIME. 06/15/17  Yes Raylene Everts, MD  traZODone (DESYREL) 100 MG tablet Take 100 mg by mouth at bedtime. 01/04/20  Yes [provider]  alum & mag hydroxide-simeth (MAALOX PLUS) 400-400-40 MG/5ML suspension Take 10 mLs by mouth 3 (three) times daily as needed for indigestion. Patient not taking: Reported on 01/05/2020    [provider]  FEROSUL 325 (65 Fe) MG tablet TAKE 1 TABLET BY MOUTH TWICE DAILY. Patient not taking: Reported on 01/05/2020 06/15/17   Raylene Everts, MD    Physical Exam: Vitals:   01/05/20 1058 01/05/20 1128 01/05/20 1158 01/05/20 1228  BP: (!) 133/80 (!) 130/82 (!) 132/73 121/79  Pulse: 73 72 81 81  Resp: 21 23 18 19   Temp:      TempSrc:      SpO2: 99% 97% 99% 97%  Weight:      Height:        Constitutional: NAD, calm, comfortable Vitals:   01/05/20 1058 01/05/20 1128 01/05/20 1158 01/05/20 1228  BP: (!) 133/80 (!) 130/82 (!) 132/73 121/79  Pulse: 73 72 81 81  Resp: 21 23 18 19   Temp:      TempSrc:      SpO2: 99% 97% 99% 97%  Weight:      Height:       Eyes: PERRL, lids and conjunctivae normal ENMT: Mucous membranes are moist. Posterior pharynx clear of any exudate or lesions.Normal dentition.  Neck: normal, supple, no masses, no thyromegaly Respiratory: clear to auscultation bilaterally, no wheezing, no crackles. Normal respiratory effort. No accessory muscle use.  Cardiovascular: Regular rate and rhythm, no murmurs / rubs / gallops. No extremity edema. 2+ pedal pulses. No carotid bruits.  Abdomen: no tenderness, no masses palpated. No hepatosplenomegaly. Bowel sounds positive.  Musculoskeletal: no clubbing /  cyanosis. No joint deformity upper and lower extremities. Good ROM, no contractures. Normal muscle tone.  Skin: no rashes, lesions, ulcers. No induration Neurologic: CN 2-12 grossly intact. Sensation intact, DTR normal. Strength 5/5 in all 4.  Psychiatric: Poor judgment and insight. Alert and oriented x 1. Normal mood.    Labs on Admission: I have personally reviewed following labs and imaging studies  CBC: Recent Labs  Lab 01/05/20 1048  WBC 5.2  NEUTROABS 4.1  HGB 12.5*  HCT 38.9*  MCV 89.2  PLT 213   Basic Metabolic Panel: Recent Labs  Lab 01/05/20 1048  NA 135  K 3.5  CL 93*  CO2 30  GLUCOSE 218*  BUN 21  CREATININE 1.35*  CALCIUM 10.2   GFR: Estimated Creatinine Clearance: 52.1 mL/min (A) (by C-G formula based on SCr of 1.35 mg/dL (H)). Liver Function Tests: Recent Labs  Lab 01/05/20 1048  AST 14*  ALT 17  ALKPHOS 66  BILITOT 0.4  PROT 7.2  ALBUMIN 3.9   No results for input(s): LIPASE, AMYLASE in the last 168 hours. No results for input(s): AMMONIA in the last 168 hours. Coagulation Profile: Recent Labs  Lab 01/05/20 1048  INR 1.0   Cardiac Enzymes: No results for input(s): CKTOTAL, CKMB, CKMBINDEX, TROPONINI in the last 168 hours. BNP (last 3 results) No results for input(s): PROBNP in the last 8760 hours. HbA1C: No results for input(s): HGBA1C in the last 72 hours. CBG: No results for input(s): GLUCAP in the last 168 hours. Lipid Profile: No results for input(s): CHOL, HDL, LDLCALC, TRIG, CHOLHDL, LDLDIRECT in the last 72 hours. Thyroid Function Tests: No results for input(s): TSH, T4TOTAL, FREET4, T3FREE, THYROIDAB in the last 72 hours. Anemia Panel: No results for input(s): VITAMINB12, FOLATE, FERRITIN, TIBC, IRON, RETICCTPCT in the last 72 hours. Urine analysis:    Component Value Date/Time   COLORURINE YELLOW 01/05/2020 1034   APPEARANCEUR CLEAR 01/05/2020 1034   LABSPEC 1.020 01/05/2020 1034   PHURINE 5.5 01/05/2020 1034    GLUCOSEU >=500 (A) 01/05/2020 1034   HGBUR NEGATIVE 01/05/2020 [place] 01/05/2020 [place] 01/05/2020 1034   PROTEINUR NEGATIVE 01/05/2020 1034   NITRITE NEGATIVE 01/05/2020 [place] 01/05/2020 1034    Radiological Exams on Admission: No results found.  EKG: Independently reviewed.  Sinus rhythm with APCs  Assessment/Plan Active Problems:   Hypertension   Type 2 diabetes mellitus without complication, without long-term current use of insulin (HCC)   BPH (benign prostatic hyperplasia)   Dementia (HCC)   Acute CVA (cerebrovascular accident) ([place])   Acute ischemic stroke: Reportedly, he was found to have acute stroke.  Official report of MRI and MRA head is still pending for me.  We are admitting him per neurology recommendation for further stroke work-up.  I have ordered Doppler carotid which he is getting right now.  We will also order. -admit forTelemetry monitoring -Stroke protocol -Allow for permissive hypertension for the first 24-48h - only treat PRN if SBP >979 mmHg or diastolic blood pressure >892. Blood pressures can be gradually normalized to SBP<140 upon discharge. -Maintain Euthermia.  -He takes aspirin at home.  We will continue that and add Plavix 75 mg p.o. daily per recent guidelines in the meantime until he is seen by neurology and we will follow his recommendations. -Echocardiogram to- rule out PFO -Lipid Panel, TSH and A1C -Frequent neuro checks -Atorvastatin PO within 24 hrs.  -Risk factor modification -Consult Neurology -PT/OT eval, Speech consult  Essential hypertension: Blood pressure controlled.  Will hold all antihypertensives in order to allow permissive hypertension.  Type 2 diabetes mellitus: On Metformin at home.  Check hemoglobin A1c.  Hold Metformin.  Start on SSI.  Early onset dementia: He is on Aricept which I will continue.  BPH: Resume Flomax.  DVT prophylaxis: enoxaparin (LOVENOX)  injection 40 mg Start: 01/05/20 1245 Code Status: Full code Family Communication: None present at bedside.  Plan of care discussed with his group homes manager who is at the bedside. Disposition Plan: Admit now and potentially discharge in next 1 to 2 days Consults called: Neurology Admission status: Observation   Status is: Observation  The patient remains OBS appropriate and will d/c before 2 midnights.  Dispo: The patient is from: Group home              Anticipated d/c is to: Group home              Anticipated d/c date is: 1 day              Patient currently is not medically stable to d/c.       Darliss Cheney MD [facility]  01/05/2020, 1:15 PM  To contact the attending provider between 7A-7P or the covering provider during after hours 7P-7A, please log into the web site [email or web address]

## 2020-01-05 NOTE — ED Provider Notes (Signed)
Emergency Department Provider Note   I have reviewed the triage vital signs and the nursing notes.   HISTORY  Chief Complaint Altered Mental Status   HPI Daniel [name] is a 69 y.o. male with PMH of dementia from [facility] facility presents to the emergency department from MRI with acute stroke found on scan this morning.  Patient was undergoing evaluation for dementia and having MRI done today.  When results came back showing stroke the patient was referred by his neurologist to the emergency department.  According to staff the patient is at his mental status baseline.  Patient denies any pain, weakness, numbness to me but history is limited by underlying dementia.  Level 5 caveat applies.  Past Medical History:  Diagnosis Date  . Diabetes mellitus without complication ([place])   . High cholesterol   . Hypertension     Patient Active Problem List   Diagnosis Date Noted  . Acute CVA (cerebrovascular accident) ([place]) 01/05/2020  . Right knee pain 01/01/2017  . Osteoarthritis of right knee 01/01/2017  . Mental impairment 09/23/2016  . Hypertension 09/23/2016  . Type 2 diabetes mellitus without complication, without Json Koelzer-term current use of insulin ([place]) 09/23/2016  . BPH (benign prostatic hyperplasia) 09/23/2016  . Dementia ([place]) 09/23/2016  . 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 HIP SURGERY     left  . KIDNEY SURGERY      Allergies Patient has no known allergies.  Family History  Family history unknown: Yes    Social History Social History   Tobacco Use  . Smoking status: Never Smoker  . Smokeless tobacco: Never Used  Vaping Use  . Vaping Use: Never used  Substance Use Topics  . Alcohol use: No  . Drug use: No    Review of Systems  Constitutional: No fever/chills Eyes: No visual changes. ENT: No sore throat. Cardiovascular: Denies chest pain. Respiratory: Denies shortness of  breath. Gastrointestinal: No abdominal pain.  No nausea, no vomiting.  No diarrhea.  No constipation. Genitourinary: Negative for dysuria. Musculoskeletal: Negative for back pain. Skin: Negative for rash. Neurological: Negative for headaches, focal weakness or numbness.  10-point ROS otherwise negative.  ____________________________________________   PHYSICAL EXAM:  VITAL SIGNS: ED Triage Vitals  Enc Vitals Group     BP 01/05/20 1016 (!) 149/83     Pulse Rate 01/05/20 1016 84     Resp 01/05/20 1016 18     Temp 01/05/20 1016 (!) 97.4 F (36.3 C)     Temp Source 01/05/20 1016 Oral     SpO2 01/05/20 1016 94 %     Weight 01/05/20 1018 170 lb (77.1 kg)     Height 01/05/20 1018 5\' 8"  (1.727 m)   Constitutional: Alert and oriented. Well appearing and in no acute distress. Eyes: Conjunctivae are normal. PERRL. Head: Atraumatic. Nose: No congestion/rhinnorhea. Mouth/Throat: Mucous membranes are moist.   Neck: No stridor.   Cardiovascular: Normal rate, regular rhythm. Good peripheral circulation. Grossly normal heart sounds.   Respiratory: Normal respiratory effort.  No retractions. Lungs CTAB. Gastrointestinal: Soft and nontender. No distention.  Musculoskeletal: No lower extremity tenderness nor edema. No gross deformities of extremities. Neurologic:  Normal speech and language. No gross focal neurologic deficits are appreciated.  Skin:  Skin is warm, dry and intact. No rash noted.  ____________________________________________   LABS (all labs ordered are listed, but only abnormal results are displayed)  Labs Reviewed  CBC -  Abnormal; Notable for the following components:      Result Value   Hemoglobin 12.5 (*)    HCT 38.9 (*)    All other components within normal limits  COMPREHENSIVE METABOLIC PANEL - Abnormal; Notable for the following components:   Chloride 93 (*)    Glucose, Bld 218 (*)    Creatinine, Ser 1.35 (*)    AST 14 (*)    GFR calc non Af Amer 54 (*)     All other components within normal limits  URINALYSIS, ROUTINE W REFLEX MICROSCOPIC - Abnormal; Notable for the following components:   Glucose, UA >=500 (*)    All other components within normal limits  URINALYSIS, MICROSCOPIC (REFLEX) - Abnormal; Notable for the following components:   Bacteria, UA RARE (*)    All other components within normal limits  HEMOGLOBIN A1C - Abnormal; Notable for the following components:   Hgb A1c MFr Bld 7.7 (*)    All other components within normal limits  CBC WITH DIFFERENTIAL/PLATELET - Abnormal; Notable for the following components:   RBC 4.03 (*)    Hemoglobin 11.7 (*)    HCT 35.7 (*)    All other components within normal limits  BASIC METABOLIC PANEL - Abnormal; Notable for the following components:   Sodium 133 (*)    Potassium 3.2 (*)    Chloride 97 (*)    Glucose, Bld 167 (*)    All other components within normal limits  GLUCOSE, CAPILLARY - Abnormal; Notable for the following components:   Glucose-Capillary 63 (*)    All other components within normal limits  CBG MONITORING, ED - Abnormal; Notable for the following components:   Glucose-Capillary 176 (*)    All other components within normal limits  SARS CORONAVIRUS 2 BY RT PCR (HOSPITAL ORDER, [facility])  ETHANOL  PROTIME-INR  APTT  DIFFERENTIAL  RAPID URINE DRUG SCREEN, HOSP PERFORMED  HIV ANTIBODY (ROUTINE TESTING W REFLEX)  LIPID PANEL   ____________________________________________  EKG   EKG Interpretation  Date/Time:  Thursday January 05 2020 10:26:27 EDT Ventricular Rate:  70 PR Interval:    QRS Duration: 97 QT Interval:  388 QTC Calculation: 419 R Axis:   -11 Text Interpretation: Sinus rhythm Atrial premature complex No STEMI Confirmed by Nanda Quinton [phone]) on 01/05/2020 10:44:19 AM       ____________________________________________  RADIOLOGY  MR ANGIO HEAD WO CONTRAST  Result Date: 01/05/2020 CLINICAL DATA:  Altered mental status  and weakness. EXAM: MRI HEAD WITHOUT CONTRAST MRA HEAD WITHOUT CONTRAST TECHNIQUE: Multiplanar, multiecho pulse sequences of the brain and surrounding structures were obtained without intravenous contrast. Angiographic images of the head were obtained using MRA technique without contrast. COMPARISON:  04/10/2014 MRI head. FINDINGS: MRI HEAD FINDINGS Brain: Multifocal acute infarcts involving the bilateral parietal lobes, dorsal right corona radiata and dorsal right insula. Associated SWI signal dropout within the right cerebrum reflects hemosiderin deposition. Background punctate scattered T2/FLAIR hyperintense foci involving the supratentorial white matter. Small remote left basal ganglia lacunar insult. Mild diffuse cerebral atrophy with ex vacuo dilatation. No midline shift or extra-axial fluid collection. No mass lesion. Skull and upper cervical spine: Normal marrow signal. Upper cervical spondylosis with spinal canal narrowing at the C2-3 level. Sinuses/Orbits: Normal orbits. Sequela of right lens replacement. Clear paranasal sinuses. No mastoid effusion. Other: None. MRA HEAD FINDINGS Anterior circulation: Fenestrated anterior communicating artery. No significant stenosis, proximal occlusion, aneurysm, or vascular malformation. Posterior circulation: Dominant left vertebral artery and right AICA.  Short-segment high-grade narrowing involving the right V4 segment (1033:9). Vessel wall irregularity involving the right vertebral artery likely reflects atherosclerotic disease. No proximal occlusion, aneurysm, or vascular malformation. Venous sinuses: No evidence of thrombosis. Anatomic variants: Fetal origin of the left PCA. IMPRESSION: Multifocal acute infarcts involving the bilateral parietal lobes, dorsal right corona radiata and dorsal right insular cortex. Remote left basal ganglia lacunar infarct. Minimal chronic microvascular ischemic changes. Mild cerebral atrophy. Short-segment high-grade narrowing of the  right V4 segment. Atherosclerotic disease. These results were called by telephone at the time of interpretation on 01/05/2020 at 11:55 Am to provider [facility] , who verbally acknowledged these results. Electronically Signed   By: Primitivo Gauze M.D.   On: 01/05/2020 13:13   MR BRAIN WO CONTRAST  Result Date: 01/05/2020 CLINICAL DATA:  Altered mental status and weakness. EXAM: MRI HEAD WITHOUT CONTRAST MRA HEAD WITHOUT CONTRAST TECHNIQUE: Multiplanar, multiecho pulse sequences of the brain and surrounding structures were obtained without intravenous contrast. Angiographic images of the head were obtained using MRA technique without contrast. COMPARISON:  04/10/2014 MRI head. FINDINGS: MRI HEAD FINDINGS Brain: Multifocal acute infarcts involving the bilateral parietal lobes, dorsal right corona radiata and dorsal right insula. Associated SWI signal dropout within the right cerebrum reflects hemosiderin deposition. Background punctate scattered T2/FLAIR hyperintense foci involving the supratentorial white matter. Small remote left basal ganglia lacunar insult. Mild diffuse cerebral atrophy with ex vacuo dilatation. No midline shift or extra-axial fluid collection. No mass lesion. Skull and upper cervical spine: Normal marrow signal. Upper cervical spondylosis with spinal canal narrowing at the C2-3 level. Sinuses/Orbits: Normal orbits. Sequela of right lens replacement. Clear paranasal sinuses. No mastoid effusion. Other: None. MRA HEAD FINDINGS Anterior circulation: Fenestrated anterior communicating artery. No significant stenosis, proximal occlusion, aneurysm, or vascular malformation. Posterior circulation: Dominant left vertebral artery and right AICA. Short-segment high-grade narrowing involving the right V4 segment (1033:9). Vessel wall irregularity involving the right vertebral artery likely reflects atherosclerotic disease. No proximal occlusion, aneurysm, or vascular malformation. Venous sinuses:  No evidence of thrombosis. Anatomic variants: Fetal origin of the left PCA. IMPRESSION: Multifocal acute infarcts involving the bilateral parietal lobes, dorsal right corona radiata and dorsal right insular cortex. Remote left basal ganglia lacunar infarct. Minimal chronic microvascular ischemic changes. Mild cerebral atrophy. Short-segment high-grade narrowing of the right V4 segment. Atherosclerotic disease. These results were called by telephone at the time of interpretation on 01/05/2020 at 11:55 Am to provider [facility] , who verbally acknowledged these results. Electronically Signed   By: Primitivo Gauze M.D.   On: 01/05/2020 13:13   US Carotid Bilateral (at [facility] and [facility] only)  Result Date: 01/05/2020 CLINICAL DATA:  69 year old male with a history stroke EXAM: BILATERAL CAROTID DUPLEX ULTRASOUND TECHNIQUE: Pearline Cables scale imaging, color Doppler and duplex ultrasound were performed of bilateral carotid and vertebral arteries in the neck. COMPARISON:  None. FINDINGS: Criteria: Quantification of carotid stenosis is based on velocity parameters that correlate the residual internal carotid diameter with NASCET-based stenosis levels, using the diameter of the distal internal carotid lumen as the denominator for stenosis measurement. The following velocity measurements were obtained: RIGHT ICA:  Systolic 66 cm/sec, Diastolic 202 cm/sec CCA:  64 cm/sec SYSTOLIC ICA/CCA RATIO:  1.0 ECA:  46 cm/sec LEFT ICA:  Systolic 70 cm/sec, Diastolic 17 cm/sec CCA:  83 cm/sec SYSTOLIC ICA/CCA RATIO:  0.8 ECA:  54 cm/sec Right Brachial SBP: Not acquired Left Brachial SBP: Not acquired RIGHT CAROTID ARTERY: No significant calcifications of the right common carotid artery.  Intermediate waveform maintained. Heterogeneous and partially calcified plaque at the right carotid bifurcation. No significant lumen shadowing. Low resistance waveform of the right ICA. No significant tortuosity. RIGHT VERTEBRAL ARTERY: Antegrade flow with  low resistance waveform. LEFT CAROTID ARTERY: No significant calcifications of the left common carotid artery. Intermediate waveform maintained. Heterogeneous and partially calcified plaque at the left carotid bifurcation without significant lumen shadowing. Low resistance waveform of the left ICA. No significant tortuosity. LEFT VERTEBRAL ARTERY:  Antegrade flow with low resistance waveform. IMPRESSION: Color duplex indicates minimal heterogeneous and calcified plaque, with no hemodynamically significant stenosis by duplex criteria in the extracranial cerebrovascular circulation. Signed, Dulcy Fanny. Dellia Nims, RPVI [facility] Electronically Signed   By: Corrie Mckusick D.O.   On: 01/05/2020 13:57   ECHOCARDIOGRAM COMPLETE  Result Date: 01/05/2020    ECHOCARDIOGRAM REPORT   Patient Name:   VALERY CHANCE Date of Exam: 01/05/2020 Medical Rec #:  [id number]     Height:       68.0 in Accession #:    [id number]    Weight:       170.0 lb Date of Birth:  [birth year]     BSA:          1.907 m Patient Age:    58 years      BP:           121/79 mmHg Patient Gender: M             HR:           81 bpm. Exam Location:  [facility] Procedure: 2D Echo Indications:    Stroke 434.91 / I163.9  History:        Patient has no prior history of Echocardiogram examinations.                 Stroke; Risk Factors:Hypertension, Diabetes and Non-Smoker. Left                 hip replacement, dementia.  Sonographer:    Leavy Cella RDCS (AE) Referring Phys: [id number] RAVI PAHWANI IMPRESSIONS  1. Left ventricular ejection fraction, by estimation, is 55 to 60%. The left ventricle has normal function. The left ventricle has no regional wall motion abnormalities. Left ventricular diastolic parameters are consistent with Grade I diastolic dysfunction (impaired relaxation).  2. Right ventricular systolic function is normal. The right ventricular size is normal.  3. The mitral valve is normal in  structure. No evidence of mitral valve regurgitation. No evidence of mitral stenosis.  4. The aortic valve is tricuspid. Aortic valve regurgitation is not visualized. No aortic stenosis is present. FINDINGS  Left Ventricle: Left ventricular ejection fraction, by estimation, is 55 to 60%. The left ventricle has normal function. The left ventricle has no regional wall motion abnormalities. The left ventricular internal cavity size was normal in size. There is  no left ventricular hypertrophy. Left ventricular diastolic parameters are consistent with Grade I diastolic dysfunction (impaired relaxation). Normal left ventricular filling pressure. Right Ventricle: The right ventricular size is normal. No increase in right ventricular wall thickness. Right ventricular systolic function is normal. Left Atrium: Left atrial size was normal in size. Right Atrium: Right atrial size was normal in size. Pericardium: A small pericardial effusion is present. The pericardial effusion is posterior to the left ventricle. Mitral Valve: The mitral valve is normal in structure. No evidence of mitral valve regurgitation. No evidence of mitral valve stenosis. Tricuspid Valve: The tricuspid valve is  normal in structure. Tricuspid valve regurgitation is not demonstrated. No evidence of tricuspid stenosis. Aortic Valve: The aortic valve is tricuspid. Aortic valve regurgitation is not visualized. No aortic stenosis is present. Pulmonic Valve: The pulmonic valve was not well visualized. Pulmonic valve regurgitation is not visualized. No evidence of pulmonic stenosis. Aorta: The aortic root is normal in size and structure. Pulmonary Artery: Indeterminant PASP, inadequate TR jet. Venous: The inferior vena cava was not well visualized. IAS/Shunts: The interatrial septum was not well visualized.  LEFT VENTRICLE PLAX 2D LVIDd:         3.46 cm  Diastology LVIDs:         2.53 cm  LV e' lateral:   6.31 cm/s LV PW:         0.90 cm  LV E/e' lateral: 5.5  LV IVS:        0.90 cm  LV e' medial:    4.79 cm/s LVOT diam:     2.20 cm  LV E/e' medial:  7.2 LV SV:         72 LV SV Index:   38 LVOT Area:     3.80 cm  RIGHT VENTRICLE RV S prime:     12.60 cm/s LEFT ATRIUM             Index       RIGHT ATRIUM          Index LA diam:        4.10 cm 2.15 cm/m  RA Area:     8.65 cm LA Vol (A2C):   53.5 ml 28.05 ml/m RA Volume:   16.20 ml 8.49 ml/m LA Vol (A4C):   35.6 ml 18.66 ml/m LA Biplane Vol: 45.1 ml 23.65 ml/m  AORTIC VALVE LVOT Vmax:   106.69 cm/s LVOT Vmean:  66.280 cm/s LVOT VTI:    0.189 m  AORTA Ao Root diam: 3.10 cm MITRAL VALVE MV Area (PHT): 1.98 cm    SHUNTS MV Decel Time: 383 msec    Systemic VTI:  0.19 m MV E velocity: 34.70 cm/s  Systemic Diam: 2.20 cm MV A velocity: 55.70 cm/s MV E/A ratio:  0.62 Carlyle Dolly MD Electronically signed by Carlyle Dolly MD Signature Date/Time: 01/05/2020/4:36:25 PM    Final     ____________________________________________   PROCEDURES  Procedure(s) performed:   Procedures   ____________________________________________   INITIAL IMPRESSION / ASSESSMENT AND PLAN / ED COURSE  Pertinent labs & imaging results that were available during my care of the patient were reviewed by me and considered in my medical decision making (see chart for details).   Patient presents to the emergency department after outpatient MRI showed acute stroke.  I reviewed the MRI images and will page to discuss with the on-call neurologist, Dr. Merlene Laughter.  I have placed orders for blood and Covid testing.   Discussed patient's case with [facility] to request admission. Patient and family (if present) updated with plan. Care transferred to [facility] service.  I reviewed all nursing notes, vitals, pertinent old records, EKGs, labs, imaging (as available).  ____________________________________________  FINAL CLINICAL IMPRESSION(S) / ED DIAGNOSES  Final diagnoses:  Cerebrovascular accident (CVA) due to embolism of precerebral artery  ([place])     MEDICATIONS GIVEN DURING THIS VISIT:  Medications  acetaminophen (TYLENOL) tablet 650 mg (650 mg Oral Given 01/05/20 2239)  aspirin EC tablet 81 mg (81 mg Oral Given 01/05/20 1526)  donepezil (ARICEPT) tablet 10 mg (10 mg Oral Given 01/05/20 2240)  tamsulosin (FLOMAX)  capsule 0.4 mg (0.4 mg Oral Given 01/05/20 2239)  ferrous sulfate tablet 325 mg (325 mg Oral Given 01/05/20 2240)   stroke: mapping our early stages of recovery book (has no administration in time range)  0.9 %  sodium chloride infusion ( Intravenous Rate/Dose Verify 01/05/20 2008)  acetaminophen (TYLENOL) tablet 650 mg (has no administration in time range)    Or  acetaminophen (TYLENOL) 160 MG/5ML solution 650 mg (has no administration in time range)    Or  acetaminophen (TYLENOL) suppository 650 mg (has no administration in time range)  senna-docusate (Senokot-S) tablet 1 tablet (has no administration in time range)  clopidogrel (PLAVIX) tablet 75 mg (75 mg Oral Given 01/05/20 1524)  insulin aspart (novoLOG) injection 0-9 Units (2 Units Subcutaneous Given 01/05/20 1741)  insulin aspart (novoLOG) injection 0-5 Units (0 Units Subcutaneous Not Given 01/06/20 0348)  atorvastatin (LIPITOR) tablet 40 mg (40 mg Oral Given 01/05/20 1525)    Note:  This document was prepared using Dragon voice recognition software and may include unintentional dictation errors.  Nanda Quinton, MD, [facility] Emergency Medicine    Chalee Hirota, [facility], MD 01/06/20 [phone]

## 2020-01-05 NOTE — ED Notes (Signed)
Prudence Davidson manager at [employer] number: [phone] or Hester Mates - supervisor: [phone] for updates and if anything happens

## 2020-01-05 NOTE — Progress Notes (Signed)
*  PRELIMINARY RESULTS* Echocardiogram 2D Echocardiogram has been performed.  Leavy Cella 01/05/2020, 4:30 PM

## 2020-01-06 ENCOUNTER — Other Ambulatory Visit: Payer: Self-pay | Admitting: Medical

## 2020-01-06 DIAGNOSIS — I639 Cerebral infarction, unspecified: Secondary | ICD-10-CM | POA: Diagnosis not present

## 2020-01-06 DIAGNOSIS — G300 Alzheimer's disease with early onset: Secondary | ICD-10-CM

## 2020-01-06 DIAGNOSIS — F0280 Dementia in other diseases classified elsewhere without behavioral disturbance: Secondary | ICD-10-CM | POA: Diagnosis not present

## 2020-01-06 LAB — LIPID PANEL
Cholesterol: 140 mg/dL (ref 0–200)
HDL: 41 mg/dL (ref >40)
LDL Cholesterol: 83 mg/dL (ref 0–99)
Total CHOL/HDL Ratio: 3.4 RATIO
Triglycerides: 78 mg/dL (ref <150)
VLDL: 16 mg/dL (ref 0–40)

## 2020-01-06 LAB — BASIC METABOLIC PANEL
Anion gap: 9 (ref 5–15)
BUN: 16 mg/dL (ref 8–23)
CO2: 27 mmol/L (ref 22–32)
Calcium: 9.4 mg/dL (ref 8.9–10.3)
Chloride: 97 mmol/L — ABNORMAL LOW (ref 98–111)
Creatinine, Ser: 1.12 mg/dL (ref 0.61–1.24)
GFR calc Af Amer: >60 mL/min (ref >60)
GFR calc non Af Amer: >60 mL/min (ref >60)
Glucose, Bld: 167 mg/dL — ABNORMAL HIGH (ref 70–99)
Potassium: 3.2 mmol/L — ABNORMAL LOW (ref 3.5–5.1)
Sodium: 133 mmol/L — ABNORMAL LOW (ref 135–145)

## 2020-01-06 LAB — CBC WITH DIFFERENTIAL/PLATELET
Abs Immature Granulocytes: 0.03 10*3/uL (ref 0.00–0.07)
Basophils Absolute: 0 10*3/uL (ref 0.0–0.1)
Basophils Relative: 0 %
Eosinophils Absolute: 0.1 10*3/uL (ref 0.0–0.5)
Eosinophils Relative: 2 %
HCT: 35.7 % — ABNORMAL LOW (ref 39.0–52.0)
Hemoglobin: 11.7 g/dL — ABNORMAL LOW (ref 13.0–17.0)
Immature Granulocytes: 1 %
Lymphocytes Relative: 22 %
Lymphs Abs: 1 10*3/uL (ref 0.7–4.0)
MCH: 29 pg (ref 26.0–34.0)
MCHC: 32.8 g/dL (ref 30.0–36.0)
MCV: 88.6 fL (ref 80.0–100.0)
Monocytes Absolute: 0.5 10*3/uL (ref 0.1–1.0)
Monocytes Relative: 10 %
Neutro Abs: 3 10*3/uL (ref 1.7–7.7)
Neutrophils Relative %: 65 %
Platelets: 197 10*3/uL (ref 150–400)
RBC: 4.03 MIL/uL — ABNORMAL LOW (ref 4.22–5.81)
RDW: 13.3 % (ref 11.5–15.5)
WBC: 4.5 10*3/uL (ref 4.0–10.5)
nRBC: 0 % (ref 0.0–0.2)

## 2020-01-06 LAB — GLUCOSE, CAPILLARY
Glucose-Capillary: 183 mg/dL — ABNORMAL HIGH (ref 70–99)
Glucose-Capillary: 279 mg/dL — ABNORMAL HIGH (ref 70–99)

## 2020-01-06 MED ORDER — CLOPIDOGREL BISULFATE 75 MG PO TABS: 75.0000 mg | ORAL_TABLET | Freq: Every day | ORAL | 0 refills | Status: AC

## 2020-01-06 MED ORDER — POTASSIUM CHLORIDE CRYS ER 20 MEQ PO TBCR
40.0000 meq | EXTENDED_RELEASE_TABLET | ORAL | Status: AC
  Administered 2020-01-06 (×2): 40 meq via ORAL
  Filled 2020-01-06 (×2): qty 2

## 2020-01-06 MED ORDER — ATORVASTATIN CALCIUM 40 MG PO TABS: 40.0000 mg | ORAL_TABLET | Freq: Every day | ORAL | 0 refills | Status: DC

## 2020-01-06 MED ORDER — ASPIRIN EC 81 MG PO TBEC: 81.0000 mg | DELAYED_RELEASE_TABLET | Freq: Every day | ORAL | 0 refills | Status: AC

## 2020-01-06 NOTE — Plan of Care (Signed)
  Problem: Education: Goal: Knowledge of General Education information will improve Description: Including pain rating scale, medication(s)/side effects and non-pharmacologic comfort measures 01/06/2020 1717 by Santa Lighter, RN Outcome: Adequate for Discharge 01/06/2020 1238 by Santa Lighter, RN Outcome: Progressing   Problem: Health Behavior/Discharge Planning: Goal: Ability to manage health-related needs will improve 01/06/2020 1717 by Santa Lighter, RN Outcome: Adequate for Discharge 01/06/2020 1238 by Santa Lighter, RN Outcome: Progressing   Problem: Clinical Measurements: Goal: Ability to maintain clinical measurements within normal limits will improve Outcome: Adequate for Discharge Goal: Will remain free from infection Outcome: Adequate for Discharge Goal: Diagnostic test results will improve Outcome: Adequate for Discharge Goal: Respiratory complications will improve Outcome: Adequate for Discharge Goal: Cardiovascular complication will be avoided Outcome: Adequate for Discharge   Problem: Activity: Goal: Risk for activity intolerance will decrease Outcome: Adequate for Discharge   Problem: Nutrition: Goal: Adequate nutrition will be maintained Outcome: Adequate for Discharge   Problem: Coping: Goal: Level of anxiety will decrease Outcome: Adequate for Discharge   Problem: Elimination: Goal: Will not experience complications related to bowel motility Outcome: Adequate for Discharge Goal: Will not experience complications related to urinary retention Outcome: Adequate for Discharge   Problem: Pain Managment: Goal: General experience of comfort will improve Outcome: Adequate for Discharge   Problem: Safety: Goal: Ability to remain free from injury will improve Outcome: Adequate for Discharge   Problem: Skin Integrity: Goal: Risk for impaired skin integrity will decrease Outcome: Adequate for Discharge   Problem: Education: Goal: Knowledge of disease or  condition will improve 01/06/2020 1717 by Santa Lighter, RN Outcome: Adequate for Discharge 01/06/2020 1238 by Santa Lighter, RN Outcome: Progressing Goal: Knowledge of secondary prevention will improve 01/06/2020 1717 by Santa Lighter, RN Outcome: Adequate for Discharge 01/06/2020 1238 by Santa Lighter, RN Outcome: Progressing Goal: Knowledge of patient specific risk factors addressed and post discharge goals established will improve 01/06/2020 1717 by Santa Lighter, RN Outcome: Adequate for Discharge 01/06/2020 1238 by Santa Lighter, RN Outcome: Progressing Goal: Individualized Educational Video(s) 01/06/2020 1717 by Santa Lighter, RN Outcome: Adequate for Discharge 01/06/2020 1238 by Santa Lighter, RN Outcome: Progressing

## 2020-01-06 NOTE — Progress Notes (Signed)
Patient presented to [facility] with acute CVA. Cardiology asked to obtain a 30-day monitor to evaluate for atrial fibrillation. Order placed per request.

## 2020-01-06 NOTE — Plan of Care (Signed)
  Problem: Education: Goal: Knowledge of General Education information will improve Description: Including pain rating scale, medication(s)/side effects and non-pharmacologic comfort measures Outcome: Progressing   Problem: Health Behavior/Discharge Planning: Goal: Ability to manage health-related needs will improve Outcome: Progressing   Problem: Education: Goal: Knowledge of disease or condition will improve Outcome: Progressing Goal: Knowledge of secondary prevention will improve Outcome: Progressing Goal: Knowledge of patient specific risk factors addressed and post discharge goals established will improve Outcome: Progressing Goal: Individualized Educational Video(s) Outcome: Progressing

## 2020-01-06 NOTE — Evaluation (Signed)
Occupational Therapy Evaluation Patient Details Name: Daniel [name] MRN: [id number] DOB: [birth year] Today's Date: 01/06/2020    History of Present Illness Daniel [name] is a 69 y.o. 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  According to the history provided to me by ER physician, patient was being worked up for early onset dementia as outpatient.  His neurologist Dr. Merlene Laughter had ordered MRI.  Patient went for MRI today and he was found to have acute stroke on that so Dr. Merlene Laughter diverted this patient to the emergency department for admission for further stroke work-up.     Clinical Impression   Pt agreeable to OT evaluation this am, performing ADLs with supervision and occasional cuing for sequencing and locating items. Pt performing functional mobility with supervision. Group home staff assists with ADLs as needed. Pt is at baseline functioning with daily tasks, no further OT services required at this time.     Follow Up Recommendations  No OT follow up    Equipment Recommendations  None recommended by OT       Precautions / Restrictions Precautions Precautions: Fall Restrictions Weight Bearing Restrictions: No      Mobility Bed Mobility Overal bed mobility: Independent                Transfers Overall transfer level: Needs assistance Equipment used: None Transfers: Sit to/from Stand;Stand Pivot Transfers Sit to Stand: Supervision Stand pivot transfers: Supervision                ADL either performed or assessed with clinical judgement   ADL Overall ADL's : Needs assistance/impaired     Grooming: Wash/dry hands;Oral care;Supervision/safety;Standing Grooming Details (indicate cue type and reason): pt standing at sink for ADLs, opening toothpaste and applying to  brush independently, occasional cuing for locating items or sequening tasks             Lower Body Dressing: Supervision/safety;Sitting/lateral leans Lower Body Dressing Details (indicate cue type and reason): pt seated at EOB adjusting socks, no difficulty Toilet Transfer: Supervision/safety;Ambulation   Toileting- Clothing Manipulation and Hygiene: Supervision/safety;Sit to/from stand       Functional mobility during ADLs: Supervision/safety General ADL Comments: OT managing IV pole during tasks     Vision Baseline Vision/History: Wears glasses Wears Glasses: At all times Patient Visual Report: No change from baseline Vision Assessment?: No apparent visual deficits            Pertinent Vitals/Pain Pain Assessment: No/denies pain     Hand Dominance Right   Extremity/Trunk Assessment Upper Extremity Assessment Upper Extremity Assessment: Overall WFL for tasks assessed   Lower Extremity Assessment Lower Extremity Assessment: Defer to PT evaluation   Cervical / Trunk Assessment Cervical / Trunk Assessment: Normal   Communication Communication Communication: No difficulties   Cognition Arousal/Alertness: Awake/alert Behavior During Therapy: WFL for tasks assessed/performed Overall Cognitive Status: Within Functional Limits for tasks assessed                                                Home Living Family/patient expects to be discharged to:: Group home  Prior Functioning/Environment Level of Independence: Needs assistance  Gait / Transfers Assistance Needed: independent in mobility ADL's / Homemaking Assistance Needed: Staff assists as needed with ADLs            OT Problem List: Decreased cognition       AM-PAC OT "6 Clicks" Daily Activity     Outcome Measure Help from another person eating meals?: None Help from another person taking care of personal grooming?: A  Little Help from another person toileting, which includes using toliet, bedpan, or urinal?: A Little Help from another person bathing (including washing, rinsing, drying)?: A Little Help from another person to put on and taking off regular upper body clothing?: A Little Help from another person to put on and taking off regular lower body clothing?: A Little 6 Click Score: 19   End of Session    Activity Tolerance: Patient tolerated treatment well Patient left: in chair;with call bell/phone within reach;with chair alarm set  OT Visit Diagnosis: Muscle weakness (generalized) (M62.81)                Time: 7026-3785 OT Time Calculation (min): 21 min Charges:  OT General Charges $OT Visit: 1 Visit OT Evaluation $OT Eval Low Complexity: [place], OTR/L  [phone] 01/06/2020, 10:10 AM

## 2020-01-06 NOTE — Progress Notes (Signed)
Nsg Discharge Note  Admit Date:  01/05/2020 Discharge date: 01/06/2020   Daniel [name] to be D/C'd Group Home per MD order.  AVS completed.  Copy for chart, and copy for patient signed, and dated. Reviewed d/c paperwork with Daniel [name] at the group home. Answered all questions. Wheeled stable patient and belongings to main entrance where he was picked up to d/c to group home. Patient/caregiver able to verbalize understanding.  Discharge Medication: Allergies as of 01/06/2020   No Known Allergies     Medication List    TAKE these medications   acetaminophen 650 MG CR tablet Commonly known as: TYLENOL Take 650 mg by mouth 2 (two) times daily.   Arthritis Pain Relief 650 MG CR tablet Generic drug: acetaminophen TAKE 1 TABLET BY MOUTH TWICE DAILY. **DO NOT CRUSH**   alum & mag hydroxide-simeth 400-400-40 MG/5ML suspension Commonly known as: MAALOX PLUS Take 10 mLs by mouth 3 (three) times daily as needed for indigestion.   amLODipine 5 MG tablet Commonly known as: NORVASC TAKE (1) TABLET BY MOUTH ONCE DAILY. What changed: See the new instructions.   aspirin EC 81 MG tablet Take 1 tablet (81 mg total) by mouth daily for 21 days.   atorvastatin 40 MG tablet Commonly known as: LIPITOR Take 1 tablet (40 mg total) by mouth daily. Start taking on: January 07, 2020   clopidogrel 75 MG tablet Commonly known as: PLAVIX Take 1 tablet (75 mg total) by mouth daily. Start taking on: January 07, 2020   donepezil 10 MG tablet Commonly known as: ARICEPT TAKE (1) TABLET BY MOUTH ONCE DAILY.   FeroSul 325 (65 FE) MG tablet Generic drug: ferrous sulfate TAKE 1 TABLET BY MOUTH TWICE DAILY.   FORA Lancets Misc by Does not apply route. Test once daily   FORA V30a Blood Glucose Test test strip Generic drug: glucose blood 1 each by Other route daily. Use as instructed   hydrochlorothiazide 12.5 MG capsule Commonly known as: MICROZIDE TAKE 1 CAPSULE BY MOUTH ONCE A DAY.   hydrocortisone cream 1  % Apply 1 application topically 3 (three) times daily as needed for itching.   memantine 5 MG tablet Commonly known as: NAMENDA Take 1 tablet by mouth daily.   metFORMIN 500 MG tablet Commonly known as: GLUCOPHAGE TAKE 1 TABLET BY MOUTH TWICE DAILY.   oxybutynin 5 MG 24 hr tablet Commonly known as: DITROPAN-XL Take 5 mg by mouth daily.   ramipril 10 MG capsule Commonly known as: ALTACE TAKE 1 CAPSULE BY MOUTH ONCE DAILY. **DO NOT CRUSH**   tamsulosin 0.4 MG Caps capsule Commonly known as: FLOMAX TAKE 1 CAPSULE BY MOUTH AT BEDTIME.   traZODone 100 MG tablet Commonly known as: DESYREL Take 100 mg by mouth at bedtime.   Vitamin D3 50 MCG (2000 UT) capsule TAKE 1 CAPSULE BY MOUTH ONCE DAILY. **DO NOT CRUSH**       Discharge Assessment: Vitals:   01/06/20 0830 01/06/20 1230  BP: (!) 151/86 (!) 149/97  Pulse: 87 86  Resp: 18 18  Temp:  (!) 97.4 F (36.3 C)  SpO2: 98% 100%   Skin clean, dry and intact without evidence of skin break down, no evidence of skin tears noted. IV catheter discontinued intact. Site without signs and symptoms of complications - no redness or edema noted at insertion site, patient denies c/o pain - only slight tenderness at site.  Dressing with slight pressure applied.  D/c Instructions-Education: Discharge instructions given to patient/family with verbalized understanding. D/c education  completed with patient/family including follow up instructions, medication list, d/c activities limitations if indicated, with other d/c instructions as indicated by MD - patient able to verbalize understanding, all questions fully answered. Patient instructed to return to ED, call 911, or call MD for any changes in condition.  Patient escorted via [place], and D/C home via private auto.  Santa Lighter, RN 01/06/2020 5:35 PM

## 2020-01-06 NOTE — Plan of Care (Signed)
  Problem: Education: Goal: Knowledge of secondary prevention will improve Outcome: Progressing

## 2020-01-06 NOTE — TOC Transition Note (Signed)
Transition of Care [facility]) - CM/SW Discharge Note   Patient Details  Name: Daniel [name] MRN: [id number] Date of Birth: 1951/01/03  Transition of Care [facility]) CM/SW Contact:  Boneta Lucks, RN Phone Number: 01/06/2020, 12:23 PM   Clinical Narrative:   Patient medically ready for discharge back to [facility].  RN updated and number provided, TOC spoke with [facility] they can be here any time after 1PM.  They need copy of DC summary. TOC printed.     Final next level of care: Group Home Barriers to Discharge: Barriers Resolved   Patient Goals and CMS Choice   CMS Medicare.gov Compare Post Acute Care list provided to:: Patient Represenative (must comment)    Discharge Placement                Patient to be transferred to facility by: [facility] staff   Patient and family notified of of transfer: 01/06/20  Discharge Plan and [place]

## 2020-01-06 NOTE — Progress Notes (Signed)
Nsg Discharge Note  Admit Date:  01/05/2020 Discharge date: 01/06/2020   Daniel [name] to be D/C'd Home per MD order.  AVS completed.  Copy for chart, and copy for patient signed, and dated. Patient/caregiver able to verbalize understanding.  Discharge Medication: Allergies as of 01/06/2020   No Known Allergies     Medication List    TAKE these medications   acetaminophen 650 MG CR tablet Commonly known as: TYLENOL Take 650 mg by mouth 2 (two) times daily.   Arthritis Pain Relief 650 MG CR tablet Generic drug: acetaminophen TAKE 1 TABLET BY MOUTH TWICE DAILY. **DO NOT CRUSH**   alum & mag hydroxide-simeth 400-400-40 MG/5ML suspension Commonly known as: MAALOX PLUS Take 10 mLs by mouth 3 (three) times daily as needed for indigestion.   amLODipine 5 MG tablet Commonly known as: NORVASC TAKE (1) TABLET BY MOUTH ONCE DAILY. What changed: See the new instructions.   aspirin EC 81 MG tablet Take 1 tablet (81 mg total) by mouth daily for 21 days.   atorvastatin 40 MG tablet Commonly known as: LIPITOR Take 1 tablet (40 mg total) by mouth daily. Start taking on: January 07, 2020   clopidogrel 75 MG tablet Commonly known as: PLAVIX Take 1 tablet (75 mg total) by mouth daily. Start taking on: January 07, 2020   donepezil 10 MG tablet Commonly known as: ARICEPT TAKE (1) TABLET BY MOUTH ONCE DAILY.   FeroSul 325 (65 FE) MG tablet Generic drug: ferrous sulfate TAKE 1 TABLET BY MOUTH TWICE DAILY.   FORA Lancets Misc by Does not apply route. Test once daily   FORA V30a Blood Glucose Test test strip Generic drug: glucose blood 1 each by Other route daily. Use as instructed   hydrochlorothiazide 12.5 MG capsule Commonly known as: MICROZIDE TAKE 1 CAPSULE BY MOUTH ONCE A DAY.   hydrocortisone cream 1 % Apply 1 application topically 3 (three) times daily as needed for itching.   memantine 5 MG tablet Commonly known as: NAMENDA Take 1 tablet by mouth daily.   metFORMIN 500  MG tablet Commonly known as: GLUCOPHAGE TAKE 1 TABLET BY MOUTH TWICE DAILY.   oxybutynin 5 MG 24 hr tablet Commonly known as: DITROPAN-XL Take 5 mg by mouth daily.   ramipril 10 MG capsule Commonly known as: ALTACE TAKE 1 CAPSULE BY MOUTH ONCE DAILY. **DO NOT CRUSH**   tamsulosin 0.4 MG Caps capsule Commonly known as: FLOMAX TAKE 1 CAPSULE BY MOUTH AT BEDTIME.   traZODone 100 MG tablet Commonly known as: DESYREL Take 100 mg by mouth at bedtime.   Vitamin D3 50 MCG (2000 UT) capsule TAKE 1 CAPSULE BY MOUTH ONCE DAILY. **DO NOT CRUSH**       Discharge Assessment: Vitals:   01/06/20 0830 01/06/20 1230  BP: (!) 151/86 (!) 149/97  Pulse: 87 86  Resp: 18 18  Temp:  (!) 97.4 F (36.3 C)  SpO2: 98% 100%   Skin clean, dry and intact without evidence of skin break down, no evidence of skin tears noted. IV catheter discontinued intact. Site without signs and symptoms of complications - no redness or edema noted at insertion site, patient denies c/o pain - only slight tenderness at site.  Dressing with slight pressure applied.  D/c Instructions-Education: Discharge instructions given to patient/family with verbalized understanding. D/c education completed with patient/family including follow up instructions, medication list, d/c activities limitations if indicated, with other d/c instructions as indicated by MD - patient able to verbalize understanding, all questions fully  answered. Patient instructed to return to ED, call 911, or call MD for any changes in condition.  Patient escorted via [place], and D/C home via private auto.  Dorcas Mcmurray, LPN 2/95/6213 0:86 PM

## 2020-01-06 NOTE — Progress Notes (Signed)
SLP Cancellation Note  Patient Details Name: Daniel [name] MRN: [id number] DOB: 04/14/1951   Cancelled treatment:       Reason Eval/Treat Not Completed: SLP screened, Pt appears to be at cognitive baseline. no needs identified, will sign off. Thank you,  Alanny Rivers H. Roddie Mc, CCC-SLP Speech Language Pathologist    Wende Bushy 01/06/2020, 12:02 PM

## 2020-01-06 NOTE — Discharge Instructions (Signed)
Ischemic Stroke    An ischemic stroke is the sudden death of brain tissue. Blood carries oxygen to all areas of the body. This type of stroke happens when your blood does not flow to your brain like normal. Your brain cannot get the oxygen it needs. This is an emergency. It must be treated right away.  Symptoms of a stroke usually happen all of a sudden. You may notice them when you wake up. They can include:  · Weakness or loss of feeling in your face, arm, or leg. This often happens on one side of the body.  · Trouble walking.  · Trouble moving your arms or legs.  · Loss of balance or coordination.  · Feeling confused.  · Trouble talking or understanding what people are saying.  · Slurred speech.  · Trouble seeing.  · Seeing two of one object (double vision).  · Feeling dizzy.  · Feeling sick to your stomach (nauseous) and throwing up (vomiting).  · A very bad headache for no reason.  Get help as soon as any of these problems start. This is important. Some treatments work better if they are given right away. These include:  · Aspirin.  · Medicines to control blood pressure.  · A shot (injection) of medicine to break up the blood clot.  · Treatments given in the blood vessel (artery) to take out the clot or break it up.  Other treatments may include:  · Oxygen.  · Fluids given through an IV tube.  · Medicines to thin out your blood.  · Procedures to help your blood flow better.  What increases the risk?  Certain things may make you more likely to have a stroke. Some of these are things that you can change, such as:  · Being very overweight (obesity).  · Smoking.  · Taking birth control pills.  · Not being active.  · Drinking too much alcohol.  · Using drugs.  Other risk factors include:  · High blood pressure.  · High cholesterol.  · Diabetes.  · Heart disease.  · Being African American, Native American, Hispanic, or [place] Native.  · Being over age 60.  · Family history of stroke.  · Having had blood clots,  stroke, or warning stroke (transient ischemic attack, TIA) in the past.  · Sickle cell disease.  · Being a woman with a history of high blood pressure in pregnancy (preeclampsia).  · Migraine headache.  · Sleep apnea.  · Having an irregular heartbeat (atrial fibrillation).  · Long-term (chronic) diseases that cause soreness and swelling (inflammation).  · Disorders that affect how your blood clots.  Follow these instructions at home:  Medicines  · Take over-the-counter and prescription medicines only as told by your doctor.  · If you were told to take aspirin or another medicine to thin your blood, take it exactly as told by your doctor.  ? Taking too much of the medicine can cause bleeding.  ? If you do not take enough, it may not work as well.  · Know the side effects of your medicines. If you are taking a blood thinner, make sure you:  ? Hold pressure over any cuts for longer than usual.  ? Tell your dentist and other doctors that you take this medicine.  ? Avoid activities that may cause damage or injury to your body.  Eating and drinking  · Follow instructions from your doctor about what you cannot eat or drink.  · Eat   This may include: ? Having experts look at your home to make sure it is safe. ? Putting grab bars in the bedroom and bathroom. ? Using raised toilets. ? Putting a seat in the shower. General instructions  Do not use any tobacco products. ? Examples of these are cigarettes, chewing tobacco, and e-cigarettes. ? If you need help quitting, ask your doctor.  Limit how much alcohol you drink. This means no more than 1 drink a day for nonpregnant women and 2 drinks  a day for men. One drink equals 12 oz of beer, 5 oz of wine, or 1 oz of hard liquor.  If you need help to stop using drugs or alcohol, ask your doctor to refer you to a program or specialist.  Stay active. Exercise as told by your doctor.  Keep all follow-up visits as told by your doctor. This is important. Get help right away if:   You have any signs of a stroke. "BE FAST" is an easy way to remember the main warning signs: ? B - Balance. Signs are dizziness, sudden trouble walking, or loss of balance. ? E - Eyes. Signs are trouble seeing or a change in how you see. ? F - Face. Signs are sudden weakness or loss of feeling of the face, or the face or eyelid drooping on one side. ? A - Arms. Signs are weakness or loss of feeling in an arm. This happens suddenly and usually on one side of the body. ? S - Speech. Signs are sudden trouble speaking, slurred speech, or trouble understanding what people say. ? T - Time. Time to call emergency services. Write down what time symptoms started.  You have other signs of a stroke, such as: ? A sudden, very bad headache with no known cause. ? Feeling sick to your stomach (nausea). ? Throwing up (vomiting). ? Jerky movements you cannot control (seizure). These symptoms may be an emergency. Do not wait to see if the symptoms will go away. Get medical help right away. Call your local emergency services (911 in the [place] Do not drive yourself to the hospital. Summary  An ischemic stroke is the sudden death of brain tissue.  Symptoms of a stroke usually happen all of a sudden. You may notice them when you wake up.  Get help if you have any warning signs of a stroke. This is important. Some treatments work better if they are given right away. This information is not intended to replace advice given to you by your health care provider. Make sure you discuss any questions you have with your health care provider. Document Revised: 11/04/2017 Document  Reviewed: 08/22/2015 Elsevier Patient Education  [place].

## 2020-01-06 NOTE — Discharge Summary (Signed)
Physician Discharge Summary  Daniel LABAN FYB:[id number] DOB: 30-Oct-1950 DOA: 01/05/2020  PCP: Daniel Douglas, MD  Admit date: 01/05/2020 Discharge date: 01/06/2020  Admitted From: Group home Disposition: Group home  Recommendations for Outpatient Follow-up:  1. Follow up with PCP in 1-2 weeks 2. Please follow-up with neurology/Dr. Merlene [name] in 2 weeks 3. Please take both aspirin 81 mg and Plavix 75 mg for next 21 days and then stop aspirin and continue Plavix. 4. Please obtain BMP/CBC in one week 5. Please follow up with your PCP on the following pending results: Unresulted Labs (From admission, onward) Comment         None       Home Health: None Equipment/Devices: None  Discharge Condition: Stable CODE STATUS: Full code Diet recommendation: Cardiac  Subjective: Seen and examined.  He was alert and oriented to place.  No complaints.  At baseline.  HPI: Daniel [name] is a 69 y.o. 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  According to the history provided to me by ER physician, patient was being worked up for early onset dementia as outpatient.  His neurologist Dr. Merlene [name] had ordered MRI.  Patient went for MRI today and he was found to have acute stroke on that so Dr. Merlene [name] diverted this patient to the emergency department for admission for further stroke work-up.    ED Course: Upon arrival to ED, patient was slightly confused only because he received Xanax prior to MRI.  He was hemodynamically stable.  Basic labs such as CBC, UA, ethanol, BMP were all negative including COVID-19.  Hospital service were consulted to admit the patient while neurologist Dr. Merlene [name] will consult.  When I saw patient in the ED, group homes manager was present at the bedside.  She failed me with  information that patient has been living at a group home for more than 16 years.  Patient was slightly confused earlier due to Xanax as mentioned above however he is coming back to his baseline now.  Currently he is following all commands.  At baseline, he does not know the month, the year.  Apparently patient has 2 younger sisters who live in town however recently, group home has been having hard time getting hold of them.  Currently patient is alert.  He knows he is in the hospital.  This is his baseline.  And he has no complaints.  Brief/Interim Summary: Patient was admitted under hospital service per neurology recommendation due to new findings of a stroke on the MRI.  Later on, MRI brain and MRA brain both results were available.  Patient had multifocal acute infarcts involving the bilateral parietal lobes, dorsal right corona radiata and dorsal right insular cortex.  Remote left basal ganglia lacunar infarct.  Chronic microvascular ischemic changes.  Short segment high-grade narrowing of the right V4 segment.  No large vessel obstruction.  Patient himself did not have any symptoms at the time of admission.  He was alert and oriented x1 which was his baseline and this was confirmed by his group homes manager who knows him since past 16 years.  When patient seen again today, he remained stable without any new symptoms.  He was evaluated by PT OT and patient was thought to be very close to his baseline requiring minimal to no assist and thus  PT did not recommend any home health or SNF.  Patient's carotid Doppler did not show any significant stenosis and transthoracic echo did not show any atrial septal defect. He was supposed to be seen by neurologist Dr. Merlene [name] however was not seen until now so I called Dr. Merlene [name] and discussed patient over the phone with him in detail.  He was very well aware of the results of MRI and MRA since he was called from radiology yesterday.  He recommended patient to be  discharged on aspirin and Plavix for 3 weeks and then continue Plavix.  Due to the fact that he has multifocal and bilateral strokes, this indicates cardioembolic stroke.  Neurology recommended 30-day event monitor for patient.  For this, I informed cardiology about patient who will make arrangements for patient to receive the device through mail.  Neurology cleared the patient for discharge as well.  He is being discharged in stable condition with instructions as mentioned in this discharge summary.  I also called and discussed everything in detail with patient's group homes manager Prudence Davidson over the phone who was appreciative of the call and did not have further questions.  Discharge Diagnoses:  Active Problems:   Hypertension   Type 2 diabetes mellitus without complication, without long-term current use of insulin (HCC)   BPH (benign prostatic hyperplasia)   Dementia ([place])   Acute CVA (cerebrovascular accident) [facility])    Discharge Instructions  Discharge Instructions    Discharge patient   Complete by: As directed    Discharge disposition: 01-Home or Self Care   Discharge patient date: 01/06/2020     Allergies as of 01/06/2020   No Known Allergies     Medication List    TAKE these medications   acetaminophen 650 MG CR tablet Commonly known as: TYLENOL Take 650 mg by mouth 2 (two) times daily.   Arthritis Pain Relief 650 MG CR tablet Generic drug: acetaminophen TAKE 1 TABLET BY MOUTH TWICE DAILY. **DO NOT CRUSH**   alum & mag hydroxide-simeth 400-400-40 MG/5ML suspension Commonly known as: MAALOX PLUS Take 10 mLs by mouth 3 (three) times daily as needed for indigestion.   amLODipine 5 MG tablet Commonly known as: NORVASC TAKE (1) TABLET BY MOUTH ONCE DAILY. What changed: See the new instructions.   aspirin EC 81 MG tablet Take 1 tablet (81 mg total) by mouth daily for 21 days.   atorvastatin 40 MG tablet Commonly known as: LIPITOR Take 1 tablet (40 mg total)  by mouth daily. Start taking on: January 07, 2020   clopidogrel 75 MG tablet Commonly known as: PLAVIX Take 1 tablet (75 mg total) by mouth daily. Start taking on: January 07, 2020   donepezil 10 MG tablet Commonly known as: ARICEPT TAKE (1) TABLET BY MOUTH ONCE DAILY.   FeroSul 325 (65 FE) MG tablet Generic drug: ferrous sulfate TAKE 1 TABLET BY MOUTH TWICE DAILY.   FORA Lancets Misc by Does not apply route. Test once daily   FORA V30a Blood Glucose Test test strip Generic drug: glucose blood 1 each by Other route daily. Use as instructed   hydrochlorothiazide 12.5 MG capsule Commonly known as: MICROZIDE TAKE 1 CAPSULE BY MOUTH ONCE A DAY.   hydrocortisone cream 1 % Apply 1 application topically 3 (three) times daily as needed for itching.   memantine 5 MG tablet Commonly known as: NAMENDA Take 1 tablet by mouth daily.   metFORMIN 500 MG tablet Commonly known as: GLUCOPHAGE TAKE 1 TABLET BY MOUTH TWICE  DAILY.   oxybutynin 5 MG 24 hr tablet Commonly known as: DITROPAN-XL Take 5 mg by mouth daily.   ramipril 10 MG capsule Commonly known as: ALTACE TAKE 1 CAPSULE BY MOUTH ONCE DAILY. **DO NOT CRUSH**   tamsulosin 0.4 MG Caps capsule Commonly known as: FLOMAX TAKE 1 CAPSULE BY MOUTH AT BEDTIME.   traZODone 100 MG tablet Commonly known as: DESYREL Take 100 mg by mouth at bedtime.   Vitamin D3 50 MCG (2000 UT) capsule TAKE 1 CAPSULE BY MOUTH ONCE DAILY. **DO NOT CRUSH**       Follow-up Information    Daniel Douglas, MD Follow up in 1 week(s).   Specialties: Family Medicine, Sports Medicine Contact information: [address] [place] [address] [phone]        Phillips Odor, MD Follow up in 2 week(s).   Specialty: Neurology Contact information: [address] [place] [address] [phone]              No Known Allergies  Consultations: Neurology   Procedures/Studies: MR ANGIO HEAD WO CONTRAST  Result Date:  01/05/2020 CLINICAL DATA:  Altered mental status and weakness. EXAM: MRI HEAD WITHOUT CONTRAST MRA HEAD WITHOUT CONTRAST TECHNIQUE: Multiplanar, multiecho pulse sequences of the brain and surrounding structures were obtained without intravenous contrast. Angiographic images of the head were obtained using MRA technique without contrast. COMPARISON:  04/10/2014 MRI head. FINDINGS: MRI HEAD FINDINGS Brain: Multifocal acute infarcts involving the bilateral parietal lobes, dorsal right corona radiata and dorsal right insula. Associated SWI signal dropout within the right cerebrum reflects hemosiderin deposition. Background punctate scattered T2/FLAIR hyperintense foci involving the supratentorial white matter. Small remote left basal ganglia lacunar insult. Mild diffuse cerebral atrophy with ex vacuo dilatation. No midline shift or extra-axial fluid collection. No mass lesion. Skull and upper cervical spine: Normal marrow signal. Upper cervical spondylosis with spinal canal narrowing at the C2-3 level. Sinuses/Orbits: Normal orbits. Sequela of right lens replacement. Clear paranasal sinuses. No mastoid effusion. Other: None. MRA HEAD FINDINGS Anterior circulation: Fenestrated anterior communicating artery. No significant stenosis, proximal occlusion, aneurysm, or vascular malformation. Posterior circulation: Dominant left vertebral artery and right AICA. Short-segment high-grade narrowing involving the right V4 segment (1033:9). Vessel wall irregularity involving the right vertebral artery likely reflects atherosclerotic disease. No proximal occlusion, aneurysm, or vascular malformation. Venous sinuses: No evidence of thrombosis. Anatomic variants: Fetal origin of the left PCA. IMPRESSION: Multifocal acute infarcts involving the bilateral parietal lobes, dorsal right corona radiata and dorsal right insular cortex. Remote left basal ganglia lacunar infarct. Minimal chronic microvascular ischemic changes. Mild cerebral  atrophy. Short-segment high-grade narrowing of the right V4 segment. Atherosclerotic disease. These results were called by telephone at the time of interpretation on 01/05/2020 at 11:55 Am to provider [facility] , who verbally acknowledged these results. Electronically Signed   By: Primitivo Gauze M.D.   On: 01/05/2020 13:13   MR BRAIN WO CONTRAST  Result Date: 01/05/2020 CLINICAL DATA:  Altered mental status and weakness. EXAM: MRI HEAD WITHOUT CONTRAST MRA HEAD WITHOUT CONTRAST TECHNIQUE: Multiplanar, multiecho pulse sequences of the brain and surrounding structures were obtained without intravenous contrast. Angiographic images of the head were obtained using MRA technique without contrast. COMPARISON:  04/10/2014 MRI head. FINDINGS: MRI HEAD FINDINGS Brain: Multifocal acute infarcts involving the bilateral parietal lobes, dorsal right corona radiata and dorsal right insula. Associated SWI signal dropout within the right cerebrum reflects hemosiderin deposition. Background punctate scattered T2/FLAIR hyperintense foci involving the supratentorial white matter. Small  remote left basal ganglia lacunar insult. Mild diffuse cerebral atrophy with ex vacuo dilatation. No midline shift or extra-axial fluid collection. No mass lesion. Skull and upper cervical spine: Normal marrow signal. Upper cervical spondylosis with spinal canal narrowing at the C2-3 level. Sinuses/Orbits: Normal orbits. Sequela of right lens replacement. Clear paranasal sinuses. No mastoid effusion. Other: None. MRA HEAD FINDINGS Anterior circulation: Fenestrated anterior communicating artery. No significant stenosis, proximal occlusion, aneurysm, or vascular malformation. Posterior circulation: Dominant left vertebral artery and right AICA. Short-segment high-grade narrowing involving the right V4 segment (1033:9). Vessel wall irregularity involving the right vertebral artery likely reflects atherosclerotic disease. No proximal occlusion,  aneurysm, or vascular malformation. Venous sinuses: No evidence of thrombosis. Anatomic variants: Fetal origin of the left PCA. IMPRESSION: Multifocal acute infarcts involving the bilateral parietal lobes, dorsal right corona radiata and dorsal right insular cortex. Remote left basal ganglia lacunar infarct. Minimal chronic microvascular ischemic changes. Mild cerebral atrophy. Short-segment high-grade narrowing of the right V4 segment. Atherosclerotic disease. These results were called by telephone at the time of interpretation on 01/05/2020 at 11:55 Am to provider [facility] , who verbally acknowledged these results. Electronically Signed   By: Primitivo Gauze M.D.   On: 01/05/2020 13:13   US Carotid Bilateral (at [facility] and [facility] only)  Result Date: 01/05/2020 CLINICAL DATA:  69 year old male with a history stroke EXAM: BILATERAL CAROTID DUPLEX ULTRASOUND TECHNIQUE: Pearline Cables scale imaging, color Doppler and duplex ultrasound were performed of bilateral carotid and vertebral arteries in the neck. COMPARISON:  None. FINDINGS: Criteria: Quantification of carotid stenosis is based on velocity parameters that correlate the residual internal carotid diameter with NASCET-based stenosis levels, using the diameter of the distal internal carotid lumen as the denominator for stenosis measurement. The following velocity measurements were obtained: RIGHT ICA:  Systolic 66 cm/sec, Diastolic 527 cm/sec CCA:  64 cm/sec SYSTOLIC ICA/CCA RATIO:  1.0 ECA:  46 cm/sec LEFT ICA:  Systolic 70 cm/sec, Diastolic 17 cm/sec CCA:  83 cm/sec SYSTOLIC ICA/CCA RATIO:  0.8 ECA:  54 cm/sec Right Brachial SBP: Not acquired Left Brachial SBP: Not acquired RIGHT CAROTID ARTERY: No significant calcifications of the right common carotid artery. Intermediate waveform maintained. Heterogeneous and partially calcified plaque at the right carotid bifurcation. No significant lumen shadowing. Low resistance waveform of the right ICA. No significant  tortuosity. RIGHT VERTEBRAL ARTERY: Antegrade flow with low resistance waveform. LEFT CAROTID ARTERY: No significant calcifications of the left common carotid artery. Intermediate waveform maintained. Heterogeneous and partially calcified plaque at the left carotid bifurcation without significant lumen shadowing. Low resistance waveform of the left ICA. No significant tortuosity. LEFT VERTEBRAL ARTERY:  Antegrade flow with low resistance waveform. IMPRESSION: Color duplex indicates minimal heterogeneous and calcified plaque, with no hemodynamically significant stenosis by duplex criteria in the extracranial cerebrovascular circulation. Signed, Dulcy Fanny. Dellia Nims, RPVI [facility] Radiology Electronically Signed   By: Corrie Mckusick D.O.   On: 01/05/2020 13:57   ECHOCARDIOGRAM COMPLETE  Result Date: 01/05/2020    ECHOCARDIOGRAM REPORT   Patient Name:   TYELER GOEDKEN Date of Exam: 01/05/2020 Medical Rec #:  [id number]     Height:       68.0 in Accession #:    [id number]    Weight:       170.0 lb Date of Birth:  1951/03/10     BSA:          1.907 m Patient Age:    48 years  BP:           121/79 mmHg Patient Gender: M             HR:           81 bpm. Exam Location:  [facility] Procedure: 2D Echo Indications:    Stroke 434.91 / I163.9  History:        Patient has no prior history of Echocardiogram examinations.                 Stroke; Risk Factors:Hypertension, Diabetes and Non-Smoker. Left                 hip replacement, dementia.  Sonographer:    Leavy Cella RDCS (AE) Referring Phys: [id number] Reverie Vaquera IMPRESSIONS  1. Left ventricular ejection fraction, by estimation, is 55 to 60%. The left ventricle has normal function. The left ventricle has no regional wall motion abnormalities. Left ventricular diastolic parameters are consistent with Grade I diastolic dysfunction (impaired relaxation).  2. Right ventricular systolic function is normal. The right  ventricular size is normal.  3. The mitral valve is normal in structure. No evidence of mitral valve regurgitation. No evidence of mitral stenosis.  4. The aortic valve is tricuspid. Aortic valve regurgitation is not visualized. No aortic stenosis is present. FINDINGS  Left Ventricle: Left ventricular ejection fraction, by estimation, is 55 to 60%. The left ventricle has normal function. The left ventricle has no regional wall motion abnormalities. The left ventricular internal cavity size was normal in size. There is  no left ventricular hypertrophy. Left ventricular diastolic parameters are consistent with Grade I diastolic dysfunction (impaired relaxation). Normal left ventricular filling pressure. Right Ventricle: The right ventricular size is normal. No increase in right ventricular wall thickness. Right ventricular systolic function is normal. Left Atrium: Left atrial size was normal in size. Right Atrium: Right atrial size was normal in size. Pericardium: A small pericardial effusion is present. The pericardial effusion is posterior to the left ventricle. Mitral Valve: The mitral valve is normal in structure. No evidence of mitral valve regurgitation. No evidence of mitral valve stenosis. Tricuspid Valve: The tricuspid valve is normal in structure. Tricuspid valve regurgitation is not demonstrated. No evidence of tricuspid stenosis. Aortic Valve: The aortic valve is tricuspid. Aortic valve regurgitation is not visualized. No aortic stenosis is present. Pulmonic Valve: The pulmonic valve was not well visualized. Pulmonic valve regurgitation is not visualized. No evidence of pulmonic stenosis. Aorta: The aortic root is normal in size and structure. Pulmonary Artery: Indeterminant PASP, inadequate TR jet. Venous: The inferior vena cava was not well visualized. IAS/Shunts: The interatrial septum was not well visualized.  LEFT VENTRICLE PLAX 2D LVIDd:         3.46 cm  Diastology LVIDs:         2.53 cm  LV e'  lateral:   6.31 cm/s LV PW:         0.90 cm  LV E/e' lateral: 5.5 LV IVS:        0.90 cm  LV e' medial:    4.79 cm/s LVOT diam:     2.20 cm  LV E/e' medial:  7.2 LV SV:         72 LV SV Index:   38 LVOT Area:     3.80 cm  RIGHT VENTRICLE RV S prime:     12.60 cm/s LEFT ATRIUM             Index  RIGHT ATRIUM          Index LA diam:        4.10 cm 2.15 cm/m  RA Area:     8.65 cm LA Vol (A2C):   53.5 ml 28.05 ml/m RA Volume:   16.20 ml 8.49 ml/m LA Vol (A4C):   35.6 ml 18.66 ml/m LA Biplane Vol: 45.1 ml 23.65 ml/m  AORTIC VALVE LVOT Vmax:   106.69 cm/s LVOT Vmean:  66.280 cm/s LVOT VTI:    0.189 m  AORTA Ao Root diam: 3.10 cm MITRAL VALVE MV Area (PHT): 1.98 cm    SHUNTS MV Decel Time: 383 msec    Systemic VTI:  0.19 m MV E velocity: 34.70 cm/s  Systemic Diam: 2.20 cm MV A velocity: 55.70 cm/s MV E/A ratio:  0.62 Carlyle Dolly MD Electronically signed by Carlyle Dolly MD Signature Date/Time: 01/05/2020/4:36:25 PM    Final       Discharge Exam: Vitals:   01/06/20 0625 01/06/20 0830  BP: (!) 160/84 (!) 151/86  Pulse: 61 87  Resp: 18 18  Temp: 98.6 F (37 C)   SpO2: 100% 98%   Vitals:   01/06/20 0230 01/06/20 0430 01/06/20 0625 01/06/20 0830  BP: 116/85 (!) 133/86 (!) 160/84 (!) 151/86  Pulse: 73 70 61 87  Resp: 16 16 18 18   Temp: 98.6 F (37 C) 99 F (37.2 C) 98.6 F (37 C)   TempSrc:  Oral    SpO2: 98% 99% 100% 98%  Weight:      Height:        General: Pt is alert, awake, not in acute distress Cardiovascular: RRR, S1/S2 +, no rubs, no gallops Respiratory: CTA bilaterally, no wheezing, no rhonchi Abdominal: Soft, NT, ND, bowel sounds + Extremities: no edema, no cyanosis    The results of significant diagnostics from this hospitalization (including imaging, microbiology, ancillary and laboratory) are listed below for reference.     Microbiology: Recent Results (from the past 240 hour(s))  SARS Coronavirus 2 by RT PCR (hospital order, performed in [facility]  hospital lab) Nasopharyngeal Nasopharyngeal Swab     Status: None   Collection Time: 01/05/20 10:34 AM   Specimen: Nasopharyngeal Swab  Result Value Ref Range Status   SARS Coronavirus 2 NEGATIVE NEGATIVE Final    Comment: (NOTE) SARS-CoV-2 target nucleic acids are NOT DETECTED.  The SARS-CoV-2 RNA is generally detectable in upper and lower respiratory specimens during the acute phase of infection. The lowest concentration of SARS-CoV-2 viral copies this assay can detect is 250 copies / mL. A negative result does not preclude SARS-CoV-2 infection and should not be used as the sole basis for treatment or other patient management decisions.  A negative result may occur with improper specimen collection / handling, submission of specimen other than nasopharyngeal swab, presence of viral mutation(s) within the areas targeted by this assay, and inadequate number of viral copies (<250 copies / mL). A negative result must be combined with clinical observations, patient history, and epidemiological information.  Fact Sheet for Patients:   StrictlyIdeas.no  Fact Sheet for Healthcare Providers: BankingDealers.co.za  This test is not yet approved or  cleared by the [place] FDA and has been authorized for detection and/or diagnosis of SARS-CoV-2 by FDA under an Emergency Use Authorization (EUA).  This EUA will remain in effect (meaning this test can be used) for the duration of the COVID-19 declaration under Section 564(b)(1) of the Act, 21 U.S.C. section 360bbb-3(b)(1), unless the authorization is  terminated or revoked sooner.  Performed at [facility], [address] [place], [place] [address]      Labs: BNP (last 3 results) No results for input(s): BNP in the last 8760 hours. Basic Metabolic Panel: Recent Labs  Lab 01/05/20 1048 01/06/20 0449  NA 135 133*  K 3.5 3.2*  CL 93* 97*  CO2 30 27  GLUCOSE 218* 167*  BUN 21 16   CREATININE 1.35* 1.12  CALCIUM 10.2 9.4   Liver Function Tests: Recent Labs  Lab 01/05/20 1048  AST 14*  ALT 17  ALKPHOS 66  BILITOT 0.4  PROT 7.2  ALBUMIN 3.9   No results for input(s): LIPASE, AMYLASE in the last 168 hours. No results for input(s): AMMONIA in the last 168 hours. CBC: Recent Labs  Lab 01/05/20 1048 01/06/20 0449  WBC 5.2 4.5  NEUTROABS 4.1 3.0  HGB 12.5* 11.7*  HCT 38.9* 35.7*  MCV 89.2 88.6  PLT 200 197   Cardiac Enzymes: No results for input(s): CKTOTAL, CKMB, CKMBINDEX, TROPONINI in the last 168 hours. BNP: Invalid input(s): POCBNP CBG: Recent Labs  Lab 01/05/20 1655 01/05/20 2038 01/06/20 0721 01/06/20 1056  GLUCAP 176* 63* 183* 279*   D-Dimer No results for input(s): DDIMER in the last 72 hours. Hgb A1c Recent Labs    01/05/20 1048  HGBA1C 7.7*   Lipid Profile Recent Labs    01/06/20 0449  CHOL 140  HDL 41  LDLCALC 83  TRIG 78  CHOLHDL 3.4   Thyroid function studies No results for input(s): TSH, T4TOTAL, T3FREE, THYROIDAB in the last 72 hours.  Invalid input(s): FREET3 Anemia work up No results for input(s): VITAMINB12, FOLATE, FERRITIN, TIBC, IRON, RETICCTPCT in the last 72 hours. Urinalysis    Component Value Date/Time   COLORURINE YELLOW 01/05/2020 1034   APPEARANCEUR CLEAR 01/05/2020 1034   LABSPEC 1.020 01/05/2020 1034   PHURINE 5.5 01/05/2020 1034   GLUCOSEU >=500 (A) 01/05/2020 1034   HGBUR NEGATIVE 01/05/2020 [place] 01/05/2020 1034   KETONESUR NEGATIVE 01/05/2020 1034   PROTEINUR NEGATIVE 01/05/2020 1034   NITRITE NEGATIVE 01/05/2020 1034   LEUKOCYTESUR NEGATIVE 01/05/2020 1034   Sepsis Labs Invalid input(s): PROCALCITONIN,  WBC,  LACTICIDVEN Microbiology Recent Results (from the past 240 hour(s))  SARS Coronavirus 2 by RT PCR (hospital order, performed in [place] hospital lab) Nasopharyngeal Nasopharyngeal Swab     Status: None   Collection Time: 01/05/20 10:34 AM    Specimen: Nasopharyngeal Swab  Result Value Ref Range Status   SARS Coronavirus 2 NEGATIVE NEGATIVE Final    Comment: (NOTE) SARS-CoV-2 target nucleic acids are NOT DETECTED.  The SARS-CoV-2 RNA is generally detectable in upper and lower respiratory specimens during the acute phase of infection. The lowest concentration of SARS-CoV-2 viral copies this assay can detect is 250 copies / mL. A negative result does not preclude SARS-CoV-2 infection and should not be used as the sole basis for treatment or other patient management decisions.  A negative result may occur with improper specimen collection / handling, submission of specimen other than nasopharyngeal swab, presence of viral mutation(s) within the areas targeted by this assay, and inadequate number of viral copies (<250 copies / mL). A negative result must be combined with clinical observations, patient history, and epidemiological information.  Fact Sheet for Patients:   StrictlyIdeas.no  Fact Sheet for Healthcare Providers: BankingDealers.co.za  This test is not yet approved or  cleared by the [place] FDA and has been authorized for  detection and/or diagnosis of SARS-CoV-2 by FDA under an Emergency Use Authorization (EUA).  This EUA will remain in effect (meaning this test can be used) for the duration of the COVID-19 declaration under Section 564(b)(1) of the Act, 21 U.S.C. section 360bbb-3(b)(1), unless the authorization is terminated or revoked sooner.  Performed at [facility], [address], [place],  [address]      Time coordinating discharge: Over 30 minutes  SIGNED:   Darliss Cheney, MD  [facility] 01/06/2020, 11:20 AM  If 7PM-7AM, please contact night-coverage [email or web address]

## 2020-01-06 NOTE — Evaluation (Signed)
Physical Therapy Evaluation Patient Details Name: MURIEL WILBER MRN: [id number] DOB: [birth year] Today's Date: 01/06/2020   History of Present Illness  VEDANSH KERSTETTER is a 69 y.o. 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  According to the history provided to me by ER physician, patient was being worked up for early onset dementia as outpatient.  His neurologist Dr. Merlene Laughter had ordered MRI.  Patient went for MRI today and he was found to have acute stroke on that so Dr. Merlene Laughter diverted this patient to the emergency department for admission for further stroke work-up.    Clinical Impression  Patient functioning near baseline for functional mobility and gait but is limited for functional mobility as stated below secondary to impaired gait and balance. Patient seated in chair at beginning of session and is able to transfer to standing without AD. He is slightly unsteady upon initially standing which improves and is able to ambulate without AD demonstrating some unsteadiness. He requires min assist for safety and balance with gait. Patient states he is at his baseline level of function. Patient able to transition to supine in bed without assist. Patient discharged to care of nursing for ambulation daily as tolerated for length of stay.       Follow Up Recommendations No PT follow up    Equipment Recommendations  None recommended by PT    Recommendations for Other Services       Precautions / Restrictions Precautions Precautions: Fall Restrictions Weight Bearing Restrictions: No      Mobility  Bed Mobility Overal bed mobility: Independent             General bed mobility comments: Patient seated in chair at beginning of session, able to independently transition back to supine  independently at end of session  Transfers Overall transfer level: Needs assistance Equipment used: None Transfers: Sit to/from [facility] Sit to Stand: Min guard Stand pivot transfers: Min guard       General transfer comment: able to transfer to standing without AD, minimally unsteady upon standing  Ambulation/Gait Ambulation/Gait assistance: Min assist Gait Distance (Feet): 40 Feet Assistive device: None Gait Pattern/deviations: Shuffle;Decreased step length - right;Decreased step length - left;Step-through pattern;Decreased stride length Gait velocity: decreased   General Gait Details: slow, labored gait, unsteady with poor foot clearance, no loss of balance or falls, assist for safety/balance  Stairs            Wheelchair Mobility    Modified Rankin (Stroke Patients Only)       Balance Overall balance assessment: Needs assistance Sitting-balance support: Feet supported Sitting balance-Leahy Scale: Good Sitting balance - Comments: sitting edge of chair     Standing balance-Leahy Scale: Fair Standing balance comment: fair/ poor without AD                             Pertinent Vitals/Pain Pain Assessment: No/denies pain    Home Living Family/patient expects to be discharged to:: Group home                      Prior Function Level of Independence: Needs assistance   Gait / Transfers Assistance Needed: independent in mobility, ambulates short distances without AD  ADL's / Homemaking Assistance Needed:  Staff assists as needed with ADLs        Hand Dominance   Dominant Hand: Right    Extremity/Trunk Assessment   Upper Extremity Assessment Upper Extremity Assessment: Defer to OT evaluation    Lower Extremity Assessment Lower Extremity Assessment: Overall WFL for tasks assessed    Cervical / Trunk Assessment Cervical / Trunk Assessment: Normal  Communication   Communication: No difficulties  Cognition  Arousal/Alertness: Awake/alert Behavior During Therapy: WFL for tasks assessed/performed Overall Cognitive Status: Within Functional Limits for tasks assessed                                        General Comments      Exercises     Assessment/Plan    PT Assessment Patent does not need any further PT services  PT Problem List         PT Treatment Interventions      PT Goals (Current goals can be found in the Care Plan section)  Acute Rehab PT Goals Patient Stated Goal: Return home PT Goal Formulation: With patient Time For Goal Achievement: 01/06/20 Potential to Achieve Goals: Good    Frequency     Barriers to discharge        Co-evaluation               AM-PAC PT "6 Clicks" Mobility  Outcome Measure Help needed turning from your back to your side while in a flat bed without using bedrails?: None Help needed moving from lying on your back to sitting on the side of a flat bed without using bedrails?: None Help needed moving to and from a bed to a chair (including a wheelchair)?: A Little Help needed standing up from a chair using your arms (e.g., wheelchair or bedside chair)?: A Little Help needed to walk in hospital room?: A Little Help needed climbing 3-5 steps with a railing? : A Lot 6 Click Score: 19    End of Session Equipment Utilized During Treatment: Gait belt Activity Tolerance: Patient tolerated treatment well Patient left: in bed;with call bell/phone within reach;with bed alarm set Nurse Communication: Mobility status PT Visit Diagnosis: Unsteadiness on feet (R26.81);Other abnormalities of gait and mobility (R26.89);Muscle weakness (generalized) (M62.81)    Time: 9675-9163 PT Time Calculation (min) (ACUTE ONLY): 12 min   Charges:   PT Evaluation $PT Eval Moderate Complexity: 1 Mod          10:40 AM, 01/06/20 Mearl Latin PT, DPT Physical Therapist at [facility]

## 2020-01-09 ENCOUNTER — Telehealth: Payer: Self-pay | Admitting: *Deleted

## 2020-01-09 ENCOUNTER — Encounter: Payer: Self-pay | Admitting: *Deleted

## 2020-01-09 NOTE — Telephone Encounter (Signed)
[employer] to ship 30 day Cardiac event monitor to [facility],Attn:   Ralph Dowdy.  [address], [place], [place] [address].  Letter with instructions mailed to home

## 2020-01-15 ENCOUNTER — Encounter (INDEPENDENT_AMBULATORY_CARE_PROVIDER_SITE_OTHER): Payer: Medicare Other

## 2020-01-15 DIAGNOSIS — I639 Cerebral infarction, unspecified: Secondary | ICD-10-CM

## 2020-01-15 DIAGNOSIS — I4891 Unspecified atrial fibrillation: Secondary | ICD-10-CM | POA: Diagnosis not present

## 2020-02-08 ENCOUNTER — Ambulatory Visit: Payer: Medicare Other | Admitting: Nurse Practitioner

## 2020-02-16 ENCOUNTER — Other Ambulatory Visit: Payer: Self-pay | Admitting: Medical

## 2020-02-16 DIAGNOSIS — I4891 Unspecified atrial fibrillation: Secondary | ICD-10-CM

## 2020-02-16 DIAGNOSIS — I639 Cerebral infarction, unspecified: Secondary | ICD-10-CM

## 2020-02-21 ENCOUNTER — Encounter (HOSPITAL_COMMUNITY): Payer: Self-pay | Admitting: *Deleted

## 2020-02-21 ENCOUNTER — Emergency Department (HOSPITAL_COMMUNITY)
Admission: EM | Admit: 2020-02-21 | Discharge: 2020-02-21 | Disposition: A | Discharge status: Home / Self Care | Payer: Medicare Other | Attending: Emergency Medicine | Admitting: Emergency Medicine

## 2020-02-21 ENCOUNTER — Emergency Department (HOSPITAL_COMMUNITY): Payer: Medicare Other

## 2020-02-21 DIAGNOSIS — M1711 Unilateral primary osteoarthritis, right knee: Secondary | ICD-10-CM | POA: Diagnosis not present

## 2020-02-21 DIAGNOSIS — Z79899 Other long term (current) drug therapy: Secondary | ICD-10-CM | POA: Diagnosis not present

## 2020-02-21 DIAGNOSIS — G459 Transient cerebral ischemic attack, unspecified: Secondary | ICD-10-CM

## 2020-02-21 DIAGNOSIS — Z7982 Long term (current) use of aspirin: Secondary | ICD-10-CM | POA: Insufficient documentation

## 2020-02-21 DIAGNOSIS — Z7984 Long term (current) use of oral hypoglycemic drugs: Secondary | ICD-10-CM | POA: Insufficient documentation

## 2020-02-21 DIAGNOSIS — R001 Bradycardia, unspecified: Secondary | ICD-10-CM | POA: Insufficient documentation

## 2020-02-21 DIAGNOSIS — Z96642 Presence of left artificial hip joint: Secondary | ICD-10-CM | POA: Insufficient documentation

## 2020-02-21 DIAGNOSIS — I10 Essential (primary) hypertension: Secondary | ICD-10-CM | POA: Diagnosis not present

## 2020-02-21 DIAGNOSIS — E119 Type 2 diabetes mellitus without complications: Secondary | ICD-10-CM | POA: Diagnosis not present

## 2020-02-21 DIAGNOSIS — F0390 Unspecified dementia without behavioral disturbance: Secondary | ICD-10-CM | POA: Diagnosis not present

## 2020-02-21 DIAGNOSIS — R29810 Facial weakness: Secondary | ICD-10-CM | POA: Diagnosis present

## 2020-02-21 LAB — RAPID URINE DRUG SCREEN, HOSP PERFORMED
Amphetamines: NOT DETECTED
Barbiturates: NOT DETECTED
Benzodiazepines: NOT DETECTED
Cocaine: NOT DETECTED
Opiates: NOT DETECTED
Tetrahydrocannabinol: NOT DETECTED

## 2020-02-21 LAB — URINALYSIS, ROUTINE W REFLEX MICROSCOPIC
Bilirubin Urine: NEGATIVE
Glucose, UA: NEGATIVE mg/dL
Hgb urine dipstick: NEGATIVE
Ketones, ur: NEGATIVE mg/dL
Leukocytes,Ua: NEGATIVE
Nitrite: NEGATIVE
Protein, ur: NEGATIVE mg/dL
Specific Gravity, Urine: 1.012 (ref 1.005–1.030)
pH: 5 (ref 5.0–8.0)

## 2020-02-21 LAB — APTT: aPTT: 26 seconds (ref 24–36)

## 2020-02-21 LAB — COMPREHENSIVE METABOLIC PANEL
ALT: 14 U/L (ref 0–44)
AST: 11 U/L — ABNORMAL LOW (ref 15–41)
Albumin: 3.4 g/dL — ABNORMAL LOW (ref 3.5–5.0)
Alkaline Phosphatase: 59 U/L (ref 38–126)
Anion gap: 10 (ref 5–15)
BUN: 33 mg/dL — ABNORMAL HIGH (ref 8–23)
CO2: 28 mmol/L (ref 22–32)
Calcium: 9.8 mg/dL (ref 8.9–10.3)
Chloride: 97 mmol/L — ABNORMAL LOW (ref 98–111)
Creatinine, Ser: 1.32 mg/dL — ABNORMAL HIGH (ref 0.61–1.24)
GFR calc Af Amer: >60 mL/min (ref >60)
GFR calc non Af Amer: 55 mL/min — ABNORMAL LOW (ref >60)
Glucose, Bld: 177 mg/dL — ABNORMAL HIGH (ref 70–99)
Potassium: 3.3 mmol/L — ABNORMAL LOW (ref 3.5–5.1)
Sodium: 135 mmol/L (ref 135–145)
Total Bilirubin: 0.4 mg/dL (ref 0.3–1.2)
Total Protein: 6.2 g/dL — ABNORMAL LOW (ref 6.5–8.1)

## 2020-02-21 LAB — CBC
HCT: 34.3 % — ABNORMAL LOW (ref 39.0–52.0)
Hemoglobin: 11.3 g/dL — ABNORMAL LOW (ref 13.0–17.0)
MCH: 29 pg (ref 26.0–34.0)
MCHC: 32.9 g/dL (ref 30.0–36.0)
MCV: 88.2 fL (ref 80.0–100.0)
Platelets: 190 10*3/uL (ref 150–400)
RBC: 3.89 MIL/uL — ABNORMAL LOW (ref 4.22–5.81)
RDW: 13.1 % (ref 11.5–15.5)
WBC: 6 10*3/uL (ref 4.0–10.5)
nRBC: 0 % (ref 0.0–0.2)

## 2020-02-21 LAB — PROTIME-INR
INR: 1.1 (ref 0.8–1.2)
Prothrombin Time: 13.6 seconds (ref 11.4–15.2)

## 2020-02-21 LAB — ETHANOL: Alcohol, Ethyl (B): <10 mg/dL (ref <10)

## 2020-02-21 IMAGING — MR MR HEAD W/O CM
11 of 12 series · 41 of 48 positions shown · non-contrast
Comparison: [DATE] MRI/MRA head.

CLINICAL DATA: Facial droop

EXAM:
MRI HEAD WITHOUT CONTRAST
TECHNIQUE: Multiplanar, multiecho pulse sequences of the brain and surrounding
structures were obtained without intravenous contrast.

[Series 5: DWI · axial · 4.0mm · 0.88mm/px · z∈[-24,+116]mm · 5 of 36 slices shown (1 of 6)]
[im 1/36]
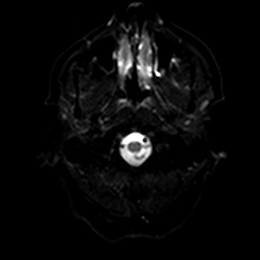
[im 9/36]
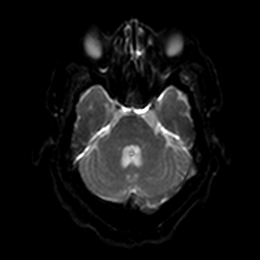
[im 18/36]
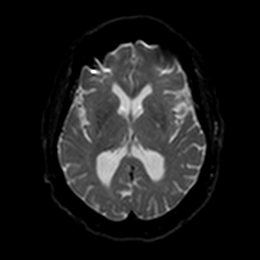
[im 27/36]
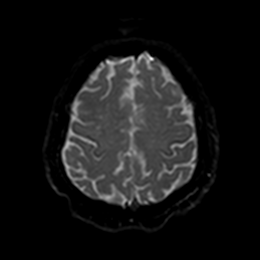
[im 36/36]
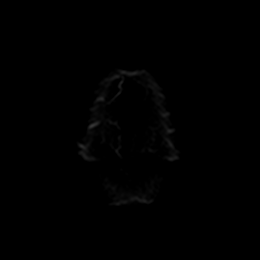

[Series 5: DWI · axial · 4.0mm · 0.88mm/px · z∈[-24,+116]mm · 5 of 36 slices shown (2 of 6)]
[im 1/36]
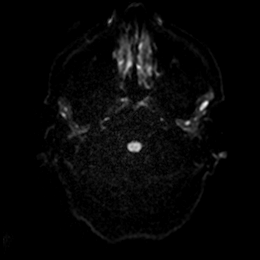
[im 9/36]
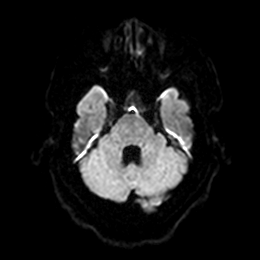
[im 18/36]
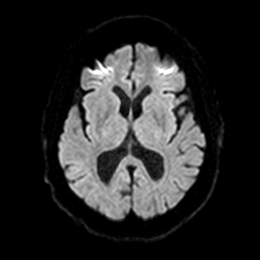
[im 27/36]
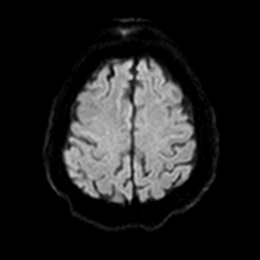
[im 36/36]
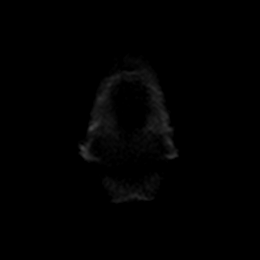

[Series 6: DWI · axial · 4.0mm · 0.88mm/px · z∈[-24,+116]mm · 4 of 36 slices shown (3 of 6)]
[im 1/36]
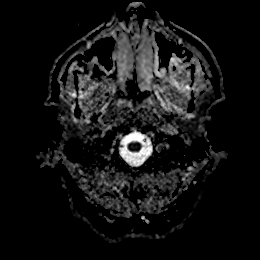
[im 12/36]
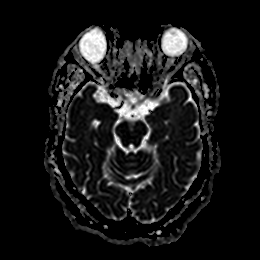
[im 24/36]
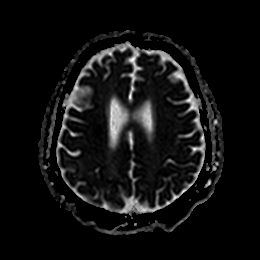
[im 36/36]
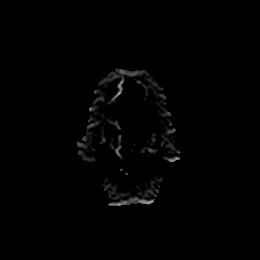

[Series 7: DWI · coronal · 4.0mm · 0.88mm/px · 4 of 32 slices shown (4 of 6)]
[im 1/32]
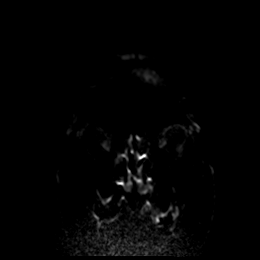
[im 11/32]
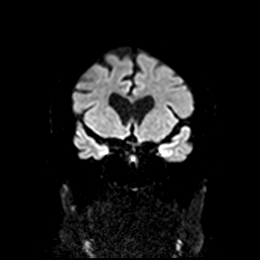
[im 21/32]
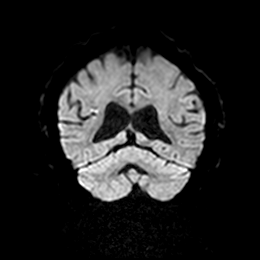
[im 32/32]
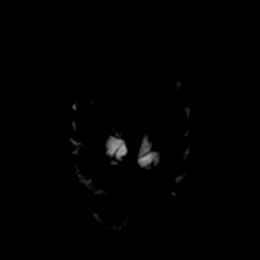

[Series 7: DWI · coronal · 4.0mm · 0.88mm/px · 4 of 32 slices shown (5 of 6)]
[im 1/32]
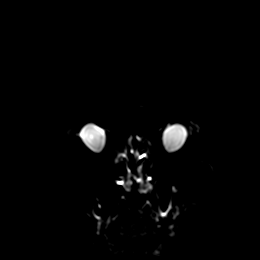
[im 11/32]
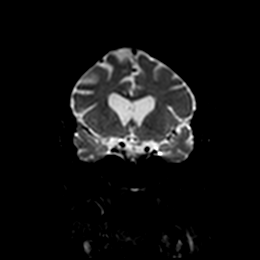
[im 21/32]
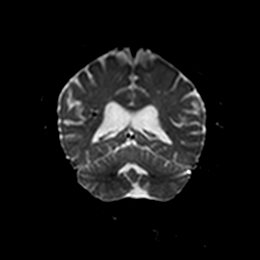
[im 32/32]
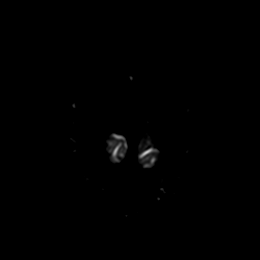

[Series 8: DWI · coronal · 4.0mm · 0.88mm/px · 4 of 32 slices shown (6 of 6)]
[im 1/32]
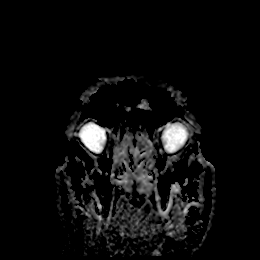
[im 11/32]
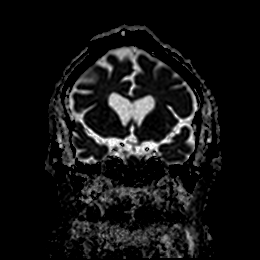
[im 21/32]
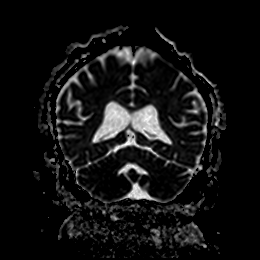
[im 32/32]
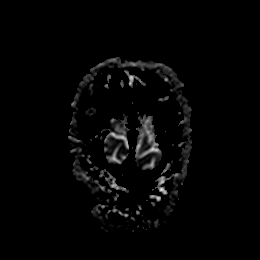

[Series 9: T1 · sagittal · 5.0mm · 0.94mm/px · 3 of 25 slices shown]
[im 1/25]
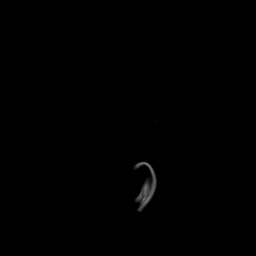
[im 13/25]
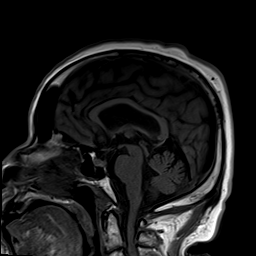
[im 25/25]
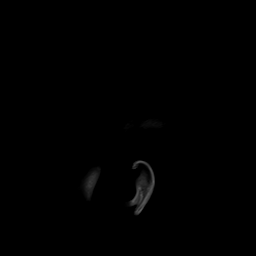

[Series 10: T2 · axial · 5.0mm · 0.72mm/px · z∈[-20,+112]mm · 2 of 20 slices shown (1 of 2)]
[im 1/20]
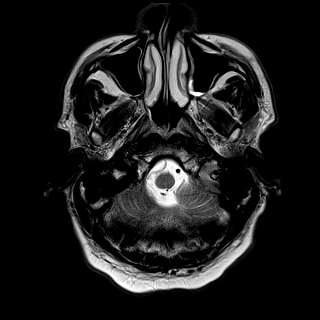
[im 20/20]
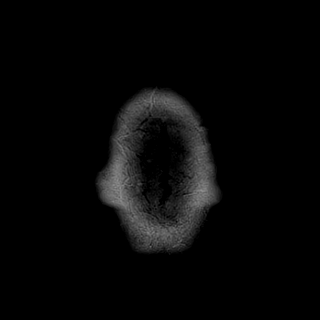

[Series 11: ax hemo · axial · 5.0mm · 0.86mm/px · z∈[-24,+119]mm · 3 of 25 slices shown]
[im 1/25]
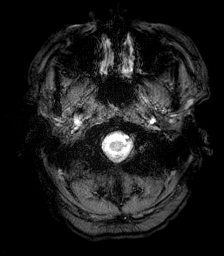
[im 13/25]
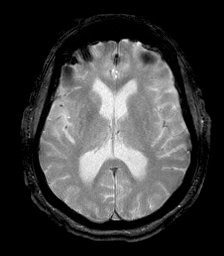
[im 25/25]
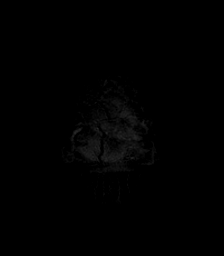

[Series 12: FLAIR · axial · 4.0mm · 0.43mm/px · z∈[-15,+109]mm · 4 of 32 slices shown]
[im 1/32]
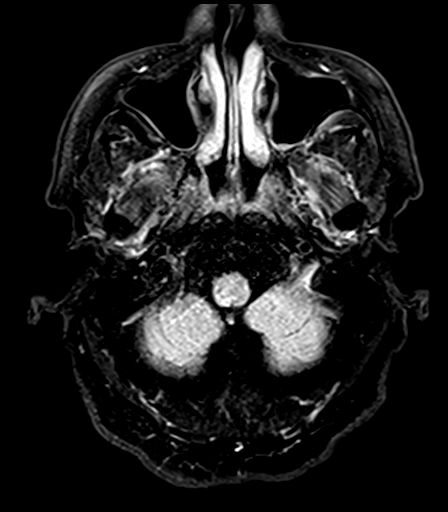
[im 11/32]
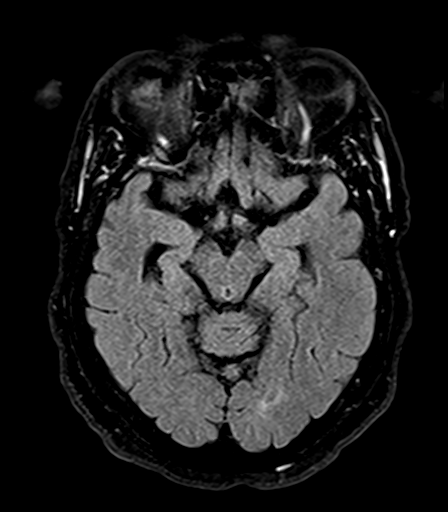
[im 21/32]
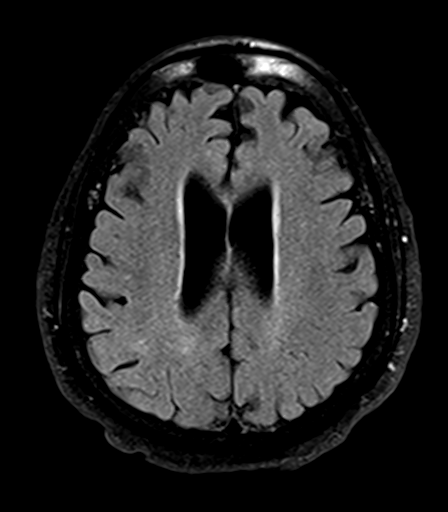
[im 32/32]
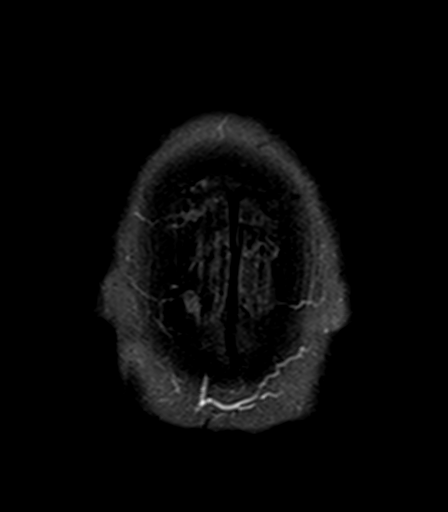

[Series 14: T2 · coronal · 5.0mm · 0.72mm/px · 3 of 28 slices shown (2 of 2)]
[im 1/28]
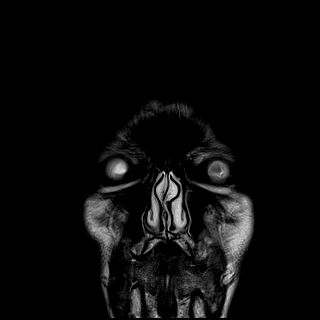
[im 14/28]
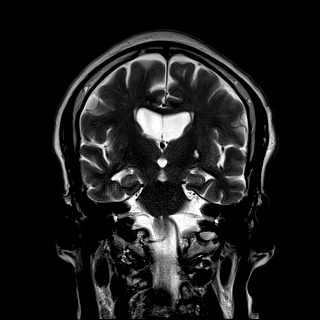
[im 28/28]
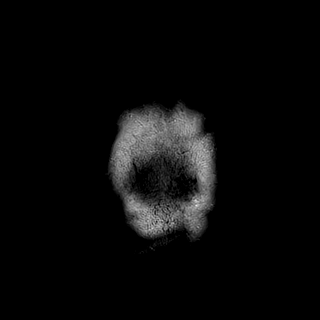

[41 of 48 positions shown; findings below may reference images not displayed]

FINDINGS: Image quality is degraded by motion artifact.

Brain: No diffusion-weighted signal abnormality. Sequela of remote
insult involving the bilateral parietal lobes, dorsal right corona
radiata and dorsal right insula. Additional remote insult involving
the left basal ganglia. Right frontoparietal microhemorrhages are
unchanged. Mild cerebral atrophy with ex vacuo dilatation. Minimal
chronic microvascular ischemic changes. No midline shift,
ventriculomegaly or extra-axial fluid collection. No mass lesion.

Vascular: Chronic high-grade right V4 segment narrowing. Otherwise
preserved major intracranial flow voids.

Skull and upper cervical spine: Normal marrow signal.

Sinuses/Orbits: Sequela of right lens replacement. Minimal left
maxillary sinus mucosal thickening. No mastoid effusion.

Other: None.
IMPRESSION: No acute intracranial process. Sequela of multifocal remote insults
as detailed above.

Mild cerebral atrophy. Minimal chronic microvascular ischemic
changes.

Chronic high-grade right V4 segment narrowing, unchanged.

## 2020-02-21 MED ORDER — ASPIRIN EC 325 MG PO TBEC: 325.0000 mg | DELAYED_RELEASE_TABLET | Freq: Every day | ORAL | 0 refills | Status: DC

## 2020-02-21 MED ORDER — ASPIRIN 81 MG PO CHEW
324.0000 mg | CHEWABLE_TABLET | Freq: Once | ORAL | Status: AC
  Administered 2020-02-21: 324 mg via ORAL
  Filled 2020-02-21: qty 4

## 2020-02-21 NOTE — ED Notes (Addendum)
Per facility, pt went to bed around 10 pm last night and reported woke up this am around 730-8 and began staggering. Facility staff also reported noted facial droop approximately 10am this am. Pt had recent dementia diagnosis.   Pt able to ambulate from wheelchair to stretcher. nad noted. Airway patent, facial symmetry noted. Sensation, equal grips, no drift in extremities. EDP aware.

## 2020-02-21 NOTE — ED Triage Notes (Signed)
Caretaker states he was stumbling around this am around 0800 and at 1000 he began to have a facial droop on the right side. Denies pain

## 2020-02-21 NOTE — ED Provider Notes (Signed)
[facility] EMERGENCY DEPARTMENT Provider Note   CSN: [id number] Arrival date & time: 02/21/20  1227     History Chief Complaint  Patient presents with  . Facial Droop    Wilkes GROSSER is a 69 y.o. male with a history of diabetes, hypertension, MR and dementia who was admitted here 1 month ago and diagnosed with an acute CVA involving bilateral parietal lobes but no residual deficits was found this morning to have an altered gait, first noticed around 8 AM, it is unclear how long the symptoms lasted but had resolved then around 10 AM the caregiver at his group home noticed a right facial droop which has improved per the caregiver at the patient's bedside.  The patient himself has no complaints and does not recognize the symptoms he had earlier today.  He is currently on Plavix daily as recommended by Dr. Merlene Laughter his neurologist.  HPI     Past Medical History:  Diagnosis Date  . Diabetes mellitus without complication ([place])   . High cholesterol   . Hypertension     Patient Active Problem List   Diagnosis Date Noted  . Acute CVA (cerebrovascular accident) ([place]) 01/05/2020  . Right knee pain 01/01/2017  . Osteoarthritis of right knee 01/01/2017  . Mental impairment 09/23/2016  . Hypertension 09/23/2016  . Type 2 diabetes mellitus without complication, without long-term current use of insulin ([place]) 09/23/2016  . BPH (benign prostatic hyperplasia) 09/23/2016  . Dementia ([place]) 09/23/2016  . 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 HIP SURGERY     left  . KIDNEY SURGERY         Family History  Family history unknown: Yes    Social History   Tobacco Use  . Smoking status: Never Smoker  . Smokeless tobacco: Never Used  Vaping Use  . Vaping Use: Never used  Substance Use Topics  . Alcohol use: No  . Drug use: No    Home Medications Prior to Admission medications   Medication Sig Start Date End Date  Taking? Authorizing Provider  acetaminophen (TYLENOL) 650 MG CR tablet Take 650 mg by mouth 2 (two) times daily.    [provider]  alum & mag hydroxide-simeth (MAALOX PLUS) 400-400-40 MG/5ML suspension Take 10 mLs by mouth 3 (three) times daily as needed for indigestion. Patient not taking: Reported on 01/05/2020    [provider]  amLODipine (NORVASC) 5 MG tablet TAKE (1) TABLET BY MOUTH ONCE DAILY. Patient taking differently: Take 10 mg by mouth daily.  06/15/17   Raylene Everts, MD  ARTHRITIS PAIN RELIEF 650 MG CR tablet TAKE 1 TABLET BY MOUTH TWICE DAILY. **DO NOT CRUSH** 06/15/17   Raylene Everts, MD  aspirin EC 325 MG tablet Take 1 tablet (325 mg total) by mouth daily. 02/21/20   Evalee Jefferson, PA-C  atorvastatin (LIPITOR) 40 MG tablet Take 1 tablet (40 mg total) by mouth daily. 01/07/20 02/06/20  Darliss Cheney, MD  Cholecalciferol (VITAMIN D3) 2000 units capsule TAKE 1 CAPSULE BY MOUTH ONCE DAILY. **DO NOT CRUSH** 01/06/18   Fayrene Helper, MD  donepezil (ARICEPT) 10 MG tablet TAKE (1) TABLET BY MOUTH ONCE DAILY. 06/15/17   Raylene Everts, MD  FEROSUL 325 (65 Fe) MG tablet TAKE 1 TABLET BY MOUTH TWICE DAILY. Patient not taking: Reported on 01/05/2020 06/15/17   Raylene Everts, MD  FORA LANCETS MISC by Does not apply route. Test once  daily    [provider]  glucose blood (FORA V30A BLOOD GLUCOSE TEST) test strip 1 each by Other route daily. Use as instructed    [provider]  hydrochlorothiazide (MICROZIDE) 12.5 MG capsule TAKE 1 CAPSULE BY MOUTH ONCE A DAY. 06/15/17   Raylene Everts, MD  hydrocortisone cream 1 % Apply 1 application topically 3 (three) times daily as needed for itching.    [provider]  memantine (NAMENDA) 5 MG tablet Take 1 tablet by mouth daily. 12/08/19   [provider]  metFORMIN (GLUCOPHAGE) 500 MG tablet TAKE 1 TABLET BY MOUTH TWICE DAILY. 06/15/17   Raylene Everts, MD  oxybutynin (DITROPAN-XL) 5  MG 24 hr tablet Take 5 mg by mouth daily. 01/04/20   [provider]  ramipril (ALTACE) 10 MG capsule TAKE 1 CAPSULE BY MOUTH ONCE DAILY. **DO NOT CRUSH** 06/15/17   Raylene Everts, MD  tamsulosin (FLOMAX) 0.4 MG CAPS capsule TAKE 1 CAPSULE BY MOUTH AT BEDTIME. 06/15/17   Raylene Everts, MD  traZODone (DESYREL) 100 MG tablet Take 100 mg by mouth at bedtime. 01/04/20   [provider]    Allergies    Patient has no known allergies.  Review of Systems   Review of Systems  Unable to perform ROS: Dementia    Physical Exam Updated Vital Signs BP 119/66 (BP Location: Right Arm)   Pulse (!) 51   Temp 97.7 F (36.5 C) (Oral)   Resp 18   SpO2 99%   Physical Exam Vitals and nursing note reviewed.  Constitutional:      Appearance: He is well-developed.  HENT:     Head: Normocephalic and atraumatic.  Eyes:     General: No visual field deficit.    Conjunctiva/sclera: Conjunctivae normal.     Comments: Slight right upper eyelid droop.   Cardiovascular:     Rate and Rhythm: Normal rate and regular rhythm.     Heart sounds: Normal heart sounds.  Pulmonary:     Effort: Pulmonary effort is normal.     Breath sounds: Normal breath sounds. No wheezing.  Abdominal:     General: Bowel sounds are normal.     Palpations: Abdomen is soft.     Tenderness: There is no abdominal tenderness.  Musculoskeletal:        General: Normal range of motion.     Cervical back: Normal range of motion.  Skin:    General: Skin is warm and dry.  Neurological:     Mental Status: He is alert.     Cranial Nerves: Facial asymmetry present.     Sensory: Sensation is intact.     Motor: Motor function is intact.     Comments: Slight right upper eyelid droop. Moves all 4 extremities, motor and strength equal.  Equal grip strength.  Disconjugate gaze normal per caregiver, however no documentation of this prior in chart.     ED Results / Procedures / Treatments   Labs (all labs ordered are  listed, but only abnormal results are displayed) Labs Reviewed  COMPREHENSIVE METABOLIC PANEL - Abnormal; Notable for the following components:      Result Value   Potassium 3.3 (*)    Chloride 97 (*)    Glucose, Bld 177 (*)    BUN 33 (*)    Creatinine, Ser 1.32 (*)    Total Protein 6.2 (*)    Albumin 3.4 (*)    AST 11 (*)    GFR calc  non Af Amer 55 (*)    All other components within normal limits  CBC - Abnormal; Notable for the following components:   RBC 3.89 (*)    Hemoglobin 11.3 (*)    HCT 34.3 (*)    All other components within normal limits  RAPID URINE DRUG SCREEN, HOSP PERFORMED  ETHANOL  URINALYSIS, ROUTINE W REFLEX MICROSCOPIC  PROTIME-INR  APTT    EKG EKG Interpretation  Date/Time:  Tuesday February 21 2020 13:02:58 EDT Ventricular Rate:  53 PR Interval:  164 QRS Duration: 94 QT Interval:  464 QTC Calculation: 435 R Axis:   -23 Text Interpretation: Sinus bradycardia with marked sinus arrhythmia Minimal voltage criteria for LVH, may be normal variant ( R in aVL ) Possible Lateral infarct , age undetermined Abnormal ECG Confirmed by Madalyn Rob [phone]) on 02/21/2020 6:07:40 PM   Radiology MR BRAIN WO CONTRAST  Result Date: 02/21/2020 CLINICAL DATA:  Facial droop EXAM: MRI HEAD WITHOUT CONTRAST TECHNIQUE: Multiplanar, multiecho pulse sequences of the brain and surrounding structures were obtained without intravenous contrast. COMPARISON:  01/05/2020 MRI/MRA head. FINDINGS: Image quality is degraded by motion artifact. Brain: No diffusion-weighted signal abnormality. Sequela of remote insult involving the bilateral parietal lobes, dorsal right corona radiata and dorsal right insula. Additional remote insult involving the left basal ganglia. Right frontoparietal microhemorrhages are unchanged. Mild cerebral atrophy with ex vacuo dilatation. Minimal chronic microvascular ischemic changes. No midline shift, ventriculomegaly or extra-axial fluid collection. No mass  lesion. Vascular: Chronic high-grade right V4 segment narrowing. Otherwise preserved major intracranial flow voids. Skull and upper cervical spine: Normal marrow signal. Sinuses/Orbits: Sequela of right lens replacement. Minimal left maxillary sinus mucosal thickening. No mastoid effusion. Other: None. IMPRESSION: No acute intracranial process. Sequela of multifocal remote insults as detailed above. Mild cerebral atrophy. Minimal chronic microvascular ischemic changes. Chronic high-grade right V4 segment narrowing, unchanged. Electronically Signed   By: Primitivo Gauze M.D.   On: 02/21/2020 14:48    Procedures Procedures (including critical care time)  Medications Ordered in ED Medications  aspirin chewable tablet 324 mg (has no administration in time range)    ED Course  I have reviewed the triage vital signs and the nursing notes.  Pertinent labs & imaging results that were available during my care of the patient were reviewed by me and considered in my medical decision making (see chart for details).    MDM Rules/Calculators/A&P                         Imaging and labs reviewed and stable.  Patient with transient right facial weakness and difficulty ambulating which has resolved.  Suspicious for TIAs.  Discussed patient with Dr. Merlene Laughter who has an appointment with this patient in 1 month, he has asked for him to be on a full adult aspirin in addition to his Plavix.  Review of the chart indicates that at the time of his discharge last month he was advised to take a baby aspirin for 21 days then discontinue.  His aspirin was reinstituted today.  Dr. Merlene Laughter also asked for a urinalysis prior to discharge home.  This is pending at this time. Final Clinical Impression(s) / ED Diagnoses Final diagnoses:  TIA (transient ischemic attack)    Rx / DC Orders ED Discharge Orders         Ordered    aspirin EC 325 MG tablet  Daily        02/21/20 1802  Evalee Jefferson,  PA-C 02/21/20 Minda Meo, MD 02/22/20 1249

## 2020-02-21 NOTE — Discharge Instructions (Addendum)
Your lab tests, EKG and MRI are reassuring today, there is no evidence that you have had a new stroke, but it is possible you may have had a "mini stroke", called a TIA.  As discussed, Dr. Merlene Laughter wants you to start taking daily adult aspirin which has been prescribed to you in addition to the Plavix you are already taking.  Start your aspirin tomorrow as you have received a dose of it here today.  Return here if you have any return of symptoms.

## 2020-04-05 ENCOUNTER — Encounter: Payer: Self-pay | Admitting: Internal Medicine

## 2020-04-05 ENCOUNTER — Ambulatory Visit: Payer: Medicare Other | Admitting: Nurse Practitioner

## 2020-04-05 NOTE — Progress Notes (Deleted)
Primary Care Physician:  Leonie Douglas, MD Primary Gastroenterologist:  Dr. Abbey Chatters  No chief complaint on file.   HPI:   Daniel [name] is a 69 y.o. male who presents on referral from primary care to schedule colonoscopy.  Nurse/phone triage was deferred to office visit due to insomnia likely necessitating MAC sedation.  No history of colonoscopy or endoscopy in our system.  Today he states he is doing okay overall.  Past Medical History:  Diagnosis Date  . Diabetes mellitus without complication ([place])   . High cholesterol   . Hypertension     Past Surgical History:  Procedure Laterality Date  . HIP SURGERY     left  . KIDNEY SURGERY      Current Outpatient Medications  Medication Sig Dispense Refill  . acetaminophen (TYLENOL) 650 MG CR tablet Take 650 mg by mouth 2 (two) times daily.    Marland Kitchen alum & mag hydroxide-simeth (MAALOX PLUS) 400-400-40 MG/5ML suspension Take 10 mLs by mouth 3 (three) times daily as needed for indigestion. (Patient not taking: Reported on 01/05/2020)    . amLODipine (NORVASC) 5 MG tablet TAKE (1) TABLET BY MOUTH ONCE DAILY. (Patient taking differently: Take 10 mg by mouth daily. ) 90 tablet 3  . ARTHRITIS PAIN RELIEF 650 MG CR tablet TAKE 1 TABLET BY MOUTH TWICE DAILY. **DO NOT CRUSH** 60 tablet PRN  . aspirin EC 325 MG tablet Take 1 tablet (325 mg total) by mouth daily. 30 tablet 0  . atorvastatin (LIPITOR) 40 MG tablet Take 1 tablet (40 mg total) by mouth daily. 30 tablet 0  . Cholecalciferol (VITAMIN D3) 2000 units capsule TAKE 1 CAPSULE BY MOUTH ONCE DAILY. **DO NOT CRUSH** 30 capsule 5  . donepezil (ARICEPT) 10 MG tablet TAKE (1) TABLET BY MOUTH ONCE DAILY. 30 tablet PRN  . FEROSUL 325 (65 Fe) MG tablet TAKE 1 TABLET BY MOUTH TWICE DAILY. (Patient not taking: Reported on 01/05/2020) 180 tablet 3  . FORA LANCETS MISC by Does not apply route. Test once daily    . glucose blood (FORA V30A BLOOD GLUCOSE TEST) test strip 1 each by Other route daily.  Use as instructed    . hydrochlorothiazide (MICROZIDE) 12.5 MG capsule TAKE 1 CAPSULE BY MOUTH ONCE A DAY. 90 capsule 3  . hydrocortisone cream 1 % Apply 1 application topically 3 (three) times daily as needed for itching.    . memantine (NAMENDA) 5 MG tablet Take 1 tablet by mouth daily.    . metFORMIN (GLUCOPHAGE) 500 MG tablet TAKE 1 TABLET BY MOUTH TWICE DAILY. 180 tablet 3  . oxybutynin (DITROPAN-XL) 5 MG 24 hr tablet Take 5 mg by mouth daily.    . ramipril (ALTACE) 10 MG capsule TAKE 1 CAPSULE BY MOUTH ONCE DAILY. **DO NOT CRUSH** 90 capsule 3  . tamsulosin (FLOMAX) 0.4 MG CAPS capsule TAKE 1 CAPSULE BY MOUTH AT BEDTIME. 90 capsule 1  . traZODone (DESYREL) 100 MG tablet Take 100 mg by mouth at bedtime.     No current facility-administered medications for this visit.    Allergies as of 04/05/2020  . (No Known Allergies)    Family History  Family history unknown: Yes    Social History   Socioeconomic History  . Marital status: Single    Spouse name: Not on file  . Number of children: Not on file  . Years of education: Not on file  . Highest education level: Not on file  Occupational History  . Not on  file  Tobacco Use  . Smoking status: Never Smoker  . Smokeless tobacco: Never Used  Vaping Use  . Vaping Use: Never used  Substance and Sexual Activity  . Alcohol use: No  . Drug use: No  . Sexual activity: Never  Other Topics Concern  . Not on file  Social History Narrative  . Not on file   Social Determinants of Health   Financial Resource Strain:   . Difficulty of Paying Living Expenses: Not on file  Food Insecurity:   . Worried About Charity fundraiser in the Last Year: Not on file  . Ran Out of Food in the Last Year: Not on file  Transportation Needs:   . Lack of Transportation (Medical): Not on file  . Lack of Transportation (Non-Medical): Not on file  Physical Activity:   . Days of Exercise per Week: Not on file  . Minutes of Exercise per Session: Not  on file  Stress:   . Feeling of Stress : Not on file  Social Connections:   . Frequency of Communication with Friends and Family: Not on file  . Frequency of Social Gatherings with Friends and Family: Not on file  . Attends Religious Services: Not on file  . Active Member of Clubs or Organizations: Not on file  . Attends Archivist Meetings: Not on file  . Marital Status: Not on file  Intimate Partner Violence:   . Fear of Current or Ex-Partner: Not on file  . Emotionally Abused: Not on file  . Physically Abused: Not on file  . Sexually Abused: Not on file    Subjective:*** Review of Systems  Constitutional: Negative for chills, fever, malaise/fatigue and weight loss.  HENT: Negative for congestion and sore throat.   Respiratory: Negative for cough and shortness of breath.   Cardiovascular: Negative for chest pain and palpitations.  Gastrointestinal: Negative for abdominal pain, blood in stool, diarrhea, melena, nausea and vomiting.  Musculoskeletal: Negative for joint pain and myalgias.  Skin: Negative for rash.  Neurological: Negative for dizziness and weakness.  Endo/Heme/Allergies: Does not bruise/bleed easily.  Psychiatric/Behavioral: Negative for depression. The patient is not nervous/anxious.   All other systems reviewed and are negative.      Objective: There were no vitals taken for this visit. Physical Exam Vitals and nursing note reviewed.  Constitutional:      General: He is not in acute distress.    Appearance: Normal appearance. He is not ill-appearing, toxic-appearing or diaphoretic.  HENT:     Head: Normocephalic and atraumatic.     Nose: No congestion or rhinorrhea.  Eyes:     General: No scleral icterus. Cardiovascular:     Rate and Rhythm: Normal rate and regular rhythm.     Heart sounds: Normal heart sounds.  Pulmonary:     Effort: Pulmonary effort is normal.     Breath sounds: Normal breath sounds.  Abdominal:     General: Bowel  sounds are normal. There is no distension.     Palpations: Abdomen is soft. There is no hepatomegaly, splenomegaly or mass.     Tenderness: There is no abdominal tenderness. There is no guarding or rebound.     Hernia: No hernia is present.  Musculoskeletal:     Cervical back: Neck supple.  Skin:    General: Skin is warm and dry.     Coloration: Skin is not jaundiced.     Findings: No bruising or rash.  Neurological:  General: No focal deficit present.     Mental Status: He is alert and oriented to person, place, and time. Mental status is at baseline.  Psychiatric:        Mood and Affect: Mood normal.        Behavior: Behavior normal.        Thought Content: Thought content normal.      Assessment:  ***   Plan: ***    Thank you for allowing [place] to participate in the care of Satira Sark, DNP, AGNP-C Adult & Gerontological Nurse Practitioner [facility]   04/05/2020 7:33 AM   Disclaimer: This note was dictated with voice recognition software. Similar sounding words can inadvertently be transcribed and may not be corrected upon review.

## 2020-06-05 ENCOUNTER — Encounter (HOSPITAL_COMMUNITY): Payer: Self-pay | Admitting: Emergency Medicine

## 2020-06-05 ENCOUNTER — Inpatient Hospital Stay (HOSPITAL_COMMUNITY)
Admission: EM | Admit: 2020-06-05 | Discharge: 2020-06-08 | DRG: 683 | Disposition: A | Discharge status: Home / Self Care | Payer: Medicare Other | Attending: Internal Medicine | Admitting: Internal Medicine

## 2020-06-05 ENCOUNTER — Other Ambulatory Visit: Payer: Self-pay

## 2020-06-05 DIAGNOSIS — Z20822 Contact with and (suspected) exposure to covid-19: Secondary | ICD-10-CM | POA: Diagnosis present

## 2020-06-05 DIAGNOSIS — E872 Acidosis: Secondary | ICD-10-CM | POA: Diagnosis present

## 2020-06-05 DIAGNOSIS — Z7984 Long term (current) use of oral hypoglycemic drugs: Secondary | ICD-10-CM

## 2020-06-05 DIAGNOSIS — N179 Acute kidney failure, unspecified: Secondary | ICD-10-CM | POA: Diagnosis present

## 2020-06-05 DIAGNOSIS — E1165 Type 2 diabetes mellitus with hyperglycemia: Secondary | ICD-10-CM | POA: Diagnosis present

## 2020-06-05 DIAGNOSIS — Z7982 Long term (current) use of aspirin: Secondary | ICD-10-CM

## 2020-06-05 DIAGNOSIS — E7800 Pure hypercholesterolemia, unspecified: Secondary | ICD-10-CM | POA: Diagnosis present

## 2020-06-05 DIAGNOSIS — R4189 Other symptoms and signs involving cognitive functions and awareness: Secondary | ICD-10-CM | POA: Diagnosis present

## 2020-06-05 DIAGNOSIS — I10 Essential (primary) hypertension: Secondary | ICD-10-CM | POA: Diagnosis present

## 2020-06-05 DIAGNOSIS — E876 Hypokalemia: Secondary | ICD-10-CM | POA: Diagnosis present

## 2020-06-05 DIAGNOSIS — R739 Hyperglycemia, unspecified: Secondary | ICD-10-CM

## 2020-06-05 DIAGNOSIS — N401 Enlarged prostate with lower urinary tract symptoms: Secondary | ICD-10-CM | POA: Diagnosis present

## 2020-06-05 DIAGNOSIS — E785 Hyperlipidemia, unspecified: Secondary | ICD-10-CM | POA: Diagnosis present

## 2020-06-05 DIAGNOSIS — Z79899 Other long term (current) drug therapy: Secondary | ICD-10-CM

## 2020-06-05 DIAGNOSIS — E119 Type 2 diabetes mellitus without complications: Secondary | ICD-10-CM

## 2020-06-05 DIAGNOSIS — E860 Dehydration: Secondary | ICD-10-CM | POA: Diagnosis present

## 2020-06-05 DIAGNOSIS — F0390 Unspecified dementia without behavioral disturbance: Secondary | ICD-10-CM | POA: Diagnosis present

## 2020-06-05 DIAGNOSIS — Z8673 Personal history of transient ischemic attack (TIA), and cerebral infarction without residual deficits: Secondary | ICD-10-CM

## 2020-06-05 DIAGNOSIS — Z96642 Presence of left artificial hip joint: Secondary | ICD-10-CM | POA: Diagnosis present

## 2020-06-05 LAB — URINALYSIS, ROUTINE W REFLEX MICROSCOPIC
Bacteria, UA: NONE SEEN
Bilirubin Urine: NEGATIVE
Glucose, UA: >=500 mg/dL — AB
Hgb urine dipstick: NEGATIVE
Ketones, ur: NEGATIVE mg/dL
Leukocytes,Ua: NEGATIVE
Nitrite: NEGATIVE
Protein, ur: NEGATIVE mg/dL
Specific Gravity, Urine: 1.014 (ref 1.005–1.030)
pH: 5 (ref 5.0–8.0)

## 2020-06-05 LAB — BASIC METABOLIC PANEL
Anion gap: 15 (ref 5–15)
BUN: 59 mg/dL — ABNORMAL HIGH (ref 8–23)
CO2: 21 mmol/L — ABNORMAL LOW (ref 22–32)
Calcium: 7.7 mg/dL — ABNORMAL LOW (ref 8.9–10.3)
Chloride: 94 mmol/L — ABNORMAL LOW (ref 98–111)
Creatinine, Ser: 2.03 mg/dL — ABNORMAL HIGH (ref 0.61–1.24)
GFR, Estimated: 35 mL/min — ABNORMAL LOW (ref >60)
Glucose, Bld: 499 mg/dL — ABNORMAL HIGH (ref 70–99)
Potassium: 3.7 mmol/L (ref 3.5–5.1)
Sodium: 130 mmol/L — ABNORMAL LOW (ref 135–145)

## 2020-06-05 LAB — CBC
HCT: 36.7 % — ABNORMAL LOW (ref 39.0–52.0)
Hemoglobin: 11.6 g/dL — ABNORMAL LOW (ref 13.0–17.0)
MCH: 28.2 pg (ref 26.0–34.0)
MCHC: 31.6 g/dL (ref 30.0–36.0)
MCV: 89.3 fL (ref 80.0–100.0)
Platelets: 205 10*3/uL (ref 150–400)
RBC: 4.11 MIL/uL — ABNORMAL LOW (ref 4.22–5.81)
RDW: 13.7 % (ref 11.5–15.5)
WBC: 10.8 10*3/uL — ABNORMAL HIGH (ref 4.0–10.5)
nRBC: 0 % (ref 0.0–0.2)

## 2020-06-05 LAB — CBG MONITORING, ED: Glucose-Capillary: 465 mg/dL — ABNORMAL HIGH (ref 70–99)

## 2020-06-05 MED ORDER — INSULIN ASPART 100 UNIT/ML ~~LOC~~ SOLN
10.0000 [IU] | Freq: Once | SUBCUTANEOUS | Status: AC
  Administered 2020-06-05: 10 [IU] via INTRAVENOUS
  Filled 2020-06-05: qty 1

## 2020-06-05 MED ORDER — SODIUM CHLORIDE 0.9 % IV BOLUS
1000.0000 mL | Freq: Once | INTRAVENOUS | Status: AC
  Administered 2020-06-05: 1000 mL via INTRAVENOUS

## 2020-06-05 NOTE — ED Triage Notes (Signed)
Per ems pt's blood sugar is 567. Pt is from [facility].

## 2020-06-05 NOTE — ED Provider Notes (Signed)
[facility] EMERGENCY DEPARTMENT Provider Note   CSN: [id number] Arrival date & time: 06/05/20  2146     History Chief Complaint  Patient presents with  . Hyperglycemia    Daniel [name] is a 69 y.o. male.  Patient sent to the emergency department from [facility]. Patient was found to be hyperglycemic prior to arrival. No known illness. Patient does have a history of cognitive impairment, cannot provide much more information. Level 5 caveat due to mental impairment.        Past Medical History:  Diagnosis Date  . Diabetes mellitus without complication ([place])   . High cholesterol   . Hypertension     Patient Active Problem List   Diagnosis Date Noted  . AKI (acute kidney injury) ([place]) 06/06/2020  . Acute CVA (cerebrovascular accident) ([place]) 01/05/2020  . Right knee pain 01/01/2017  . Osteoarthritis of right knee 01/01/2017  . Mental impairment 09/23/2016  . Hypertension 09/23/2016  . Type 2 diabetes mellitus without complication, without long-term current use of insulin ([place]) 09/23/2016  . BPH (benign prostatic hyperplasia) 09/23/2016  . Dementia ([place]) 09/23/2016  . 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 HIP SURGERY     left  . KIDNEY SURGERY         Family History  Family history unknown: Yes    Social History   Tobacco Use  . Smoking status: Never Smoker  . Smokeless tobacco: Never Used  Vaping Use  . Vaping Use: Never used  Substance Use Topics  . Alcohol use: No  . Drug use: No    Home Medications Prior to Admission medications   Medication Sig Start Date End Date Taking? Authorizing Provider  acetaminophen (TYLENOL) 650 MG CR tablet Take 650 mg by mouth 2 (two) times daily.    [provider]  alum & mag hydroxide-simeth (MAALOX PLUS) 400-400-40 MG/5ML suspension Take 10 mLs by mouth 3 (three) times daily as needed for indigestion. Patient not taking: Reported on  01/05/2020    [provider]  amLODipine (NORVASC) 5 MG tablet TAKE (1) TABLET BY MOUTH ONCE DAILY. Patient taking differently: Take 10 mg by mouth daily.  06/15/17   Raylene Everts, MD  ARTHRITIS PAIN RELIEF 650 MG CR tablet TAKE 1 TABLET BY MOUTH TWICE DAILY. **DO NOT CRUSH** 06/15/17   Raylene Everts, MD  aspirin EC 325 MG tablet Take 1 tablet (325 mg total) by mouth daily. 02/21/20   Evalee Jefferson, PA-C  atorvastatin (LIPITOR) 40 MG tablet Take 1 tablet (40 mg total) by mouth daily. 01/07/20 02/06/20  Darliss Cheney, MD  Cholecalciferol (VITAMIN D3) 2000 units capsule TAKE 1 CAPSULE BY MOUTH ONCE DAILY. **DO NOT CRUSH** 01/06/18   Fayrene Helper, MD  donepezil (ARICEPT) 10 MG tablet TAKE (1) TABLET BY MOUTH ONCE DAILY. 06/15/17   Raylene Everts, MD  FEROSUL 325 (65 Fe) MG tablet TAKE 1 TABLET BY MOUTH TWICE DAILY. Patient not taking: Reported on 01/05/2020 06/15/17   Raylene Everts, MD  FORA LANCETS MISC by Does not apply route. Test once daily    [provider]  glucose blood (FORA V30A BLOOD GLUCOSE TEST) test strip 1 each by Other route daily. Use as instructed    [provider]  hydrochlorothiazide (MICROZIDE) 12.5 MG capsule TAKE 1 CAPSULE BY MOUTH ONCE A DAY. 06/15/17   Raylene Everts, MD  hydrocortisone cream 1 % Apply 1  application topically 3 (three) times daily as needed for itching.    [provider]  memantine (NAMENDA) 5 MG tablet Take 1 tablet by mouth daily. 12/08/19   [provider]  metFORMIN (GLUCOPHAGE) 500 MG tablet TAKE 1 TABLET BY MOUTH TWICE DAILY. 06/15/17   Eustace Daffern, MD  oxybutynin (DITROPAN-XL) 5 MG 24 hr tablet Take 5 mg by mouth daily. 01/04/20   [provider]  ramipril (ALTACE) 10 MG capsule TAKE 1 CAPSULE BY MOUTH ONCE DAILY. **DO NOT CRUSH** 06/15/17   Eustace Wyndham, MD  tamsulosin (FLOMAX) 0.4 MG CAPS capsule TAKE 1 CAPSULE BY MOUTH AT BEDTIME. 06/15/17   Eustace Yeley, MD  traZODone  (DESYREL) 100 MG tablet Take 100 mg by mouth at bedtime. 01/04/20   [provider]    Allergies    Patient has no known allergies.  Review of Systems   Review of Systems  Unable to perform ROS: Dementia    Physical Exam Updated Vital Signs BP 137/74 (BP Location: Right Arm)   Pulse (!) 114   Temp 98.7 F (37.1 C) (Oral)   Resp 18   SpO2 93%   Physical Exam Vitals and nursing note reviewed.  Constitutional:      General: He is not in acute distress.    Appearance: Normal appearance. He is well-developed and well-nourished.  HENT:     Head: Normocephalic and atraumatic.     Right Ear: Hearing normal.     Left Ear: Hearing normal.     Nose: Nose normal.     Mouth/Throat:     Mouth: Oropharynx is clear and moist and mucous membranes are normal.  Eyes:     Extraocular Movements: EOM normal.     Conjunctiva/sclera: Conjunctivae normal.     Pupils: Pupils are equal, round, and reactive to light.  Cardiovascular:     Rate and Rhythm: Regular rhythm. Tachycardia present.     Heart sounds: S1 normal and S2 normal. No murmur heard. No friction rub. No gallop.   Pulmonary:     Effort: Pulmonary effort is normal. No respiratory distress.     Breath sounds: Normal breath sounds.  Chest:     Chest wall: No tenderness.  Abdominal:     General: Bowel sounds are normal.     Palpations: Abdomen is soft. There is no hepatosplenomegaly.     Tenderness: There is no abdominal tenderness. There is no guarding or rebound. Negative signs include Murphy's sign and McBurney's sign.     Hernia: No hernia is present.  Musculoskeletal:        General: Normal range of motion.     Cervical back: Normal range of motion and neck supple.  Skin:    General: Skin is warm, dry and intact.     Findings: No rash.     Nails: There is no cyanosis.  Neurological:     Mental Status: He is alert and oriented to person, place, and time.     GCS: GCS eye subscore is 4. GCS verbal subscore is 5.  GCS motor subscore is 6.     Cranial Nerves: No cranial nerve deficit.     Sensory: No sensory deficit.     Coordination: Coordination normal.     Deep Tendon Reflexes: Strength normal.  Psychiatric:        Mood and Affect: Mood and affect normal.        Speech: Speech normal.  Behavior: Behavior normal.        Thought Content: Thought content normal.     ED Results / Procedures / Treatments   Labs (all labs ordered are listed, but only abnormal results are displayed) Labs Reviewed  BASIC METABOLIC PANEL - Abnormal; Notable for the following components:      Result Value   Sodium 130 (*)    Chloride 94 (*)    CO2 21 (*)    Glucose, Bld 499 (*)    BUN 59 (*)    Creatinine, Ser 2.03 (*)    Calcium 7.7 (*)    GFR, Estimated 35 (*)    All other components within normal limits  CBC - Abnormal; Notable for the following components:   WBC 10.8 (*)    RBC 4.11 (*)    Hemoglobin 11.6 (*)    HCT 36.7 (*)    All other components within normal limits  URINALYSIS, ROUTINE W REFLEX MICROSCOPIC - Abnormal; Notable for the following components:   Glucose, UA >=500 (*)    All other components within normal limits  CBG MONITORING, ED - Abnormal; Notable for the following components:   Glucose-Capillary 465 (*)    All other components within normal limits  CBG MONITORING, ED - Abnormal; Notable for the following components:   Glucose-Capillary 399 (*)    All other components within normal limits  CBG MONITORING, ED - Abnormal; Notable for the following components:   Glucose-Capillary 437 (*)    All other components within normal limits  CBG MONITORING, ED - Abnormal; Notable for the following components:   Glucose-Capillary 453 (*)    All other components within normal limits  CBG MONITORING, ED - Abnormal; Notable for the following components:   Glucose-Capillary 367 (*)    All other components within normal limits  RESP PANEL BY RT-PCR (FLU A&B, COVID) [id number]     EKG None  Radiology No results found.  Procedures Procedures (including critical care time)  Medications Ordered in ED Medications  sodium chloride 0.9 % bolus 1,000 mL (0 mLs Intravenous Stopped 06/06/20 0124)  insulin aspart (novoLOG) injection 10 Units (10 Units Intravenous Given 06/05/20 2329)  sodium chloride 0.9 % bolus 1,000 mL (0 mLs Intravenous Stopped 06/06/20 0337)  insulin aspart (novoLOG) injection 10 Units (10 Units Subcutaneous Given 06/06/20 0215)  insulin aspart (novoLOG) injection 15 Units (15 Units Subcutaneous Given 06/06/20 0346)    ED Course  I have reviewed the triage vital signs and the nursing notes.  Pertinent labs & imaging results that were available during my care of the patient were reviewed by me and considered in my medical decision making (see chart for details).    MDM Rules/Calculators/A&P                          Patient with history of diabetes sent to the emergency department for evaluation of elevated blood sugars.  Patient with blood sugar of 499 at arrival.  There is no anion gap or sign of DKA.  Patient does, however, appear dehydrated with an acute kidney injury.  He appears well otherwise.  He is without complaints.  No signs of infection.  Patient hydrated and given multiple doses of insulin with only slight correction of his glucose.  Discussed with hospitalist, recommends admission for further management.  Final Clinical Impression(s) / ED Diagnoses Final diagnoses:  Hyperglycemia    Rx / DC Orders ED Discharge Orders    None  Orpah Greek, MD 06/06/20 [phone]

## 2020-06-06 ENCOUNTER — Other Ambulatory Visit: Payer: Self-pay

## 2020-06-06 ENCOUNTER — Encounter (HOSPITAL_COMMUNITY): Payer: Self-pay | Admitting: Internal Medicine

## 2020-06-06 DIAGNOSIS — Z7982 Long term (current) use of aspirin: Secondary | ICD-10-CM | POA: Diagnosis not present

## 2020-06-06 DIAGNOSIS — Z7984 Long term (current) use of oral hypoglycemic drugs: Secondary | ICD-10-CM | POA: Diagnosis not present

## 2020-06-06 DIAGNOSIS — E860 Dehydration: Secondary | ICD-10-CM | POA: Diagnosis present

## 2020-06-06 DIAGNOSIS — Z8673 Personal history of transient ischemic attack (TIA), and cerebral infarction without residual deficits: Secondary | ICD-10-CM | POA: Diagnosis not present

## 2020-06-06 DIAGNOSIS — Z96642 Presence of left artificial hip joint: Secondary | ICD-10-CM | POA: Diagnosis present

## 2020-06-06 DIAGNOSIS — E785 Hyperlipidemia, unspecified: Secondary | ICD-10-CM | POA: Diagnosis present

## 2020-06-06 DIAGNOSIS — F0390 Unspecified dementia without behavioral disturbance: Secondary | ICD-10-CM | POA: Diagnosis present

## 2020-06-06 DIAGNOSIS — E1165 Type 2 diabetes mellitus with hyperglycemia: Secondary | ICD-10-CM | POA: Diagnosis present

## 2020-06-06 DIAGNOSIS — Z79899 Other long term (current) drug therapy: Secondary | ICD-10-CM | POA: Diagnosis not present

## 2020-06-06 DIAGNOSIS — R4189 Other symptoms and signs involving cognitive functions and awareness: Secondary | ICD-10-CM | POA: Diagnosis present

## 2020-06-06 DIAGNOSIS — E872 Acidosis: Secondary | ICD-10-CM | POA: Diagnosis present

## 2020-06-06 DIAGNOSIS — N179 Acute kidney failure, unspecified: Secondary | ICD-10-CM | POA: Diagnosis present

## 2020-06-06 DIAGNOSIS — I10 Essential (primary) hypertension: Secondary | ICD-10-CM | POA: Diagnosis present

## 2020-06-06 DIAGNOSIS — Z20822 Contact with and (suspected) exposure to covid-19: Secondary | ICD-10-CM | POA: Diagnosis present

## 2020-06-06 DIAGNOSIS — R739 Hyperglycemia, unspecified: Secondary | ICD-10-CM | POA: Diagnosis present

## 2020-06-06 DIAGNOSIS — N401 Enlarged prostate with lower urinary tract symptoms: Secondary | ICD-10-CM | POA: Diagnosis present

## 2020-06-06 DIAGNOSIS — E119 Type 2 diabetes mellitus without complications: Secondary | ICD-10-CM | POA: Diagnosis not present

## 2020-06-06 DIAGNOSIS — E7800 Pure hypercholesterolemia, unspecified: Secondary | ICD-10-CM | POA: Diagnosis present

## 2020-06-06 DIAGNOSIS — E876 Hypokalemia: Secondary | ICD-10-CM | POA: Diagnosis present

## 2020-06-06 LAB — CBC
HCT: 34.6 % — ABNORMAL LOW (ref 39.0–52.0)
Hemoglobin: 11.6 g/dL — ABNORMAL LOW (ref 13.0–17.0)
MCH: 29.3 pg (ref 26.0–34.0)
MCHC: 33.5 g/dL (ref 30.0–36.0)
MCV: 87.4 fL (ref 80.0–100.0)
Platelets: 188 10*3/uL (ref 150–400)
RBC: 3.96 MIL/uL — ABNORMAL LOW (ref 4.22–5.81)
RDW: 13.7 % (ref 11.5–15.5)
WBC: 10.8 10*3/uL — ABNORMAL HIGH (ref 4.0–10.5)
nRBC: 0 % (ref 0.0–0.2)

## 2020-06-06 LAB — COMPREHENSIVE METABOLIC PANEL
ALT: 13 U/L (ref 0–44)
AST: 16 U/L (ref 15–41)
Albumin: 2.9 g/dL — ABNORMAL LOW (ref 3.5–5.0)
Alkaline Phosphatase: 44 U/L (ref 38–126)
Anion gap: 12 (ref 5–15)
BUN: 57 mg/dL — ABNORMAL HIGH (ref 8–23)
CO2: 21 mmol/L — ABNORMAL LOW (ref 22–32)
Calcium: 7.5 mg/dL — ABNORMAL LOW (ref 8.9–10.3)
Chloride: 102 mmol/L (ref 98–111)
Creatinine, Ser: 1.49 mg/dL — ABNORMAL HIGH (ref 0.61–1.24)
GFR, Estimated: 51 mL/min — ABNORMAL LOW (ref >60)
Glucose, Bld: 318 mg/dL — ABNORMAL HIGH (ref 70–99)
Potassium: 3 mmol/L — ABNORMAL LOW (ref 3.5–5.1)
Sodium: 135 mmol/L (ref 135–145)
Total Bilirubin: 0.5 mg/dL (ref 0.3–1.2)
Total Protein: 5.5 g/dL — ABNORMAL LOW (ref 6.5–8.1)

## 2020-06-06 LAB — PHOSPHORUS: Phosphorus: <1 mg/dL — CL (ref 2.5–4.6)

## 2020-06-06 LAB — CBG MONITORING, ED
Glucose-Capillary: 264 mg/dL — ABNORMAL HIGH (ref 70–99)
Glucose-Capillary: 367 mg/dL — ABNORMAL HIGH (ref 70–99)
Glucose-Capillary: 399 mg/dL — ABNORMAL HIGH (ref 70–99)
Glucose-Capillary: 437 mg/dL — ABNORMAL HIGH (ref 70–99)
Glucose-Capillary: 453 mg/dL — ABNORMAL HIGH (ref 70–99)

## 2020-06-06 LAB — RESP PANEL BY RT-PCR (FLU A&B, COVID) ARPGX2
Influenza A by PCR: NEGATIVE
Influenza B by PCR: NEGATIVE
SARS Coronavirus 2 by RT PCR: NEGATIVE

## 2020-06-06 LAB — HEMOGLOBIN A1C
Hgb A1c MFr Bld: 9.8 % — ABNORMAL HIGH (ref 4.8–5.6)
Mean Plasma Glucose: 234.56 mg/dL

## 2020-06-06 LAB — MAGNESIUM: Magnesium: 0.6 mg/dL — CL (ref 1.7–2.4)

## 2020-06-06 LAB — TSH: TSH: 0.163 u[IU]/mL — ABNORMAL LOW (ref 0.350–4.500)

## 2020-06-06 LAB — GLUCOSE, CAPILLARY
Glucose-Capillary: 234 mg/dL — ABNORMAL HIGH (ref 70–99)
Glucose-Capillary: 142 mg/dL — ABNORMAL HIGH (ref 70–99)

## 2020-06-06 MED ORDER — INSULIN ASPART 100 UNIT/ML ~~LOC~~ SOLN
0.0000 [IU] | Freq: Three times a day (TID) | SUBCUTANEOUS | Status: DC
  Administered 2020-06-06: 5 [IU] via SUBCUTANEOUS
  Administered 2020-06-06 (×2): 8 [IU] via SUBCUTANEOUS
  Administered 2020-06-07 – 2020-06-08 (×3): 3 [IU] via SUBCUTANEOUS
  Filled 2020-06-06: qty 1

## 2020-06-06 MED ORDER — MEMANTINE HCL 10 MG PO TABS
5.0000 mg | ORAL_TABLET | Freq: Every day | ORAL | Status: DC
  Administered 2020-06-06 – 2020-06-08 (×3): 5 mg via ORAL
  Filled 2020-06-06 (×3): qty 1

## 2020-06-06 MED ORDER — POTASSIUM PHOSPHATES 15 MMOLE/5ML IV SOLN
30.0000 mmol | Freq: Once | INTRAVENOUS | Status: AC
  Administered 2020-06-06: 30 mmol via INTRAVENOUS
  Filled 2020-06-06: qty 10

## 2020-06-06 MED ORDER — MAGNESIUM SULFATE 2 GM/50ML IV SOLN
2.0000 g | Freq: Once | INTRAVENOUS | Status: AC
  Administered 2020-06-06: 2 g via INTRAVENOUS
  Filled 2020-06-06: qty 50

## 2020-06-06 MED ORDER — TAMSULOSIN HCL 0.4 MG PO CAPS
0.4000 mg | ORAL_CAPSULE | Freq: Every day | ORAL | Status: DC
  Administered 2020-06-06 – 2020-06-07 (×2): 0.4 mg via ORAL
  Filled 2020-06-06 (×2): qty 1

## 2020-06-06 MED ORDER — ONDANSETRON HCL 4 MG/2ML IJ SOLN: 4.0000 mg | Freq: Four times a day (QID) | INTRAMUSCULAR | Status: DC | PRN

## 2020-06-06 MED ORDER — ATORVASTATIN CALCIUM 40 MG PO TABS
40.0000 mg | ORAL_TABLET | Freq: Every day | ORAL | Status: DC
  Administered 2020-06-06 – 2020-06-08 (×3): 40 mg via ORAL
  Filled 2020-06-06 (×3): qty 1

## 2020-06-06 MED ORDER — INSULIN ASPART 100 UNIT/ML ~~LOC~~ SOLN: 0.0000 [IU] | Freq: Every day | SUBCUTANEOUS | Status: DC

## 2020-06-06 MED ORDER — ASPIRIN EC 325 MG PO TBEC
325.0000 mg | DELAYED_RELEASE_TABLET | Freq: Every day | ORAL | Status: DC
  Administered 2020-06-06 – 2020-06-08 (×3): 325 mg via ORAL
  Filled 2020-06-06 (×3): qty 1

## 2020-06-06 MED ORDER — TRAZODONE HCL 50 MG PO TABS
100.0000 mg | ORAL_TABLET | Freq: Every day | ORAL | Status: DC
  Administered 2020-06-06 – 2020-06-07 (×2): 100 mg via ORAL
  Filled 2020-06-06 (×2): qty 2

## 2020-06-06 MED ORDER — ACETAMINOPHEN 325 MG PO TABS: 650.0000 mg | ORAL_TABLET | Freq: Four times a day (QID) | ORAL | Status: DC | PRN

## 2020-06-06 MED ORDER — SODIUM CHLORIDE 0.9 % IV SOLN: INTRAVENOUS | Status: AC

## 2020-06-06 MED ORDER — DONEPEZIL HCL 5 MG PO TABS
10.0000 mg | ORAL_TABLET | Freq: Every day | ORAL | Status: DC
  Administered 2020-06-06 – 2020-06-07 (×2): 10 mg via ORAL
  Filled 2020-06-06: qty 2
  Filled 2020-06-06: qty 1
  Filled 2020-06-06: qty 2
  Filled 2020-06-06: qty 1

## 2020-06-06 MED ORDER — INSULIN ASPART 100 UNIT/ML ~~LOC~~ SOLN
10.0000 [IU] | Freq: Once | SUBCUTANEOUS | Status: AC
  Administered 2020-06-06: 10 [IU] via SUBCUTANEOUS
  Filled 2020-06-06: qty 1

## 2020-06-06 MED ORDER — ACETAMINOPHEN 650 MG RE SUPP: 650.0000 mg | Freq: Four times a day (QID) | RECTAL | Status: DC | PRN

## 2020-06-06 MED ORDER — ONDANSETRON HCL 4 MG PO TABS: 4.0000 mg | ORAL_TABLET | Freq: Four times a day (QID) | ORAL | Status: DC | PRN

## 2020-06-06 MED ORDER — AMLODIPINE BESYLATE 5 MG PO TABS
10.0000 mg | ORAL_TABLET | Freq: Every day | ORAL | Status: DC
  Administered 2020-06-06 – 2020-06-08 (×3): 10 mg via ORAL
  Filled 2020-06-06 (×3): qty 2

## 2020-06-06 MED ORDER — SODIUM CHLORIDE 0.9 % IV BOLUS
1000.0000 mL | Freq: Once | INTRAVENOUS | Status: AC
  Administered 2020-06-06: 1000 mL via INTRAVENOUS

## 2020-06-06 MED ORDER — INSULIN ASPART 100 UNIT/ML ~~LOC~~ SOLN
15.0000 [IU] | Freq: Once | SUBCUTANEOUS | Status: AC
  Administered 2020-06-06: 15 [IU] via SUBCUTANEOUS
  Filled 2020-06-06: qty 1

## 2020-06-06 MED ORDER — HEPARIN SODIUM (PORCINE) 5000 UNIT/ML IJ SOLN
5000.0000 [IU] | Freq: Three times a day (TID) | INTRAMUSCULAR | Status: DC
  Administered 2020-06-06 – 2020-06-08 (×6): 5000 [IU] via SUBCUTANEOUS
  Filled 2020-06-06 (×6): qty 1

## 2020-06-06 MED ORDER — INSULIN DETEMIR 100 UNIT/ML ~~LOC~~ SOLN
8.0000 [IU] | Freq: Two times a day (BID) | SUBCUTANEOUS | Status: DC
  Administered 2020-06-06 – 2020-06-08 (×4): 8 [IU] via SUBCUTANEOUS
  Filled 2020-06-06 (×11): qty 0.08

## 2020-06-06 MED ORDER — METOPROLOL TARTRATE 25 MG PO TABS
25.0000 mg | ORAL_TABLET | Freq: Two times a day (BID) | ORAL | Status: DC
  Administered 2020-06-06 – 2020-06-08 (×4): 25 mg via ORAL
  Filled 2020-06-06 (×5): qty 1

## 2020-06-06 NOTE — ED Notes (Signed)
Contacted pt guardian, Efraim Kaufmann price, at [phone] to update her the patient will be admitted

## 2020-06-06 NOTE — ED Notes (Signed)
Date and time results received: 06/06/20 0842 (use smartphrase ".now" to insert current time)  Test: magnesium Critical Value: 0.6  Name of Provider Notified: [place]  Orders Received? Or Actions Taken?:

## 2020-06-06 NOTE — H&P (Addendum)
History and Physical    Daniel [name] [id number] DOB: 01-04-1951 DOA: 06/05/2020  PCP: Leonie Douglas, MD   Patient coming from: Group home  I have personally briefly reviewed patient's old medical records in [place]  Chief Complaint: Hyperglycemia, dehydration and acute kidney injury.  HPI: Daniel [name] is a 69 y.o. male with medical history significant of type 2 diabetes, hyperlipidemia, hypertension, dementia, prior history of CVA and BPH; who presented to the emergency department from his group home in the setting of continued hyperglycemia.  There has not been any complaints of fever, shortness of breath, coughing, chest pain, nausea, vomiting or abdominal pain.  Patient mentation has reported to be at baseline; unfortunately due to underlying cognitive impairment patient is not able to collaborate much with history.  He expressed feeling okay, but reported increase urinary frequency.  ED Course: Patient found to be hyperglycemic, with acute kidney injury, tachycardic and with abnormal/the range electrolytes (hypokalemic, hypophosphatemic and hypomagnesemia).  No acute EKG abnormalities appreciated.  Fluid resuscitation and subcutaneous insulin provided.  Bicarb is overall stable and there was no signs of elevated anion gap to rule in DKA.  Review of Systems: As per HPI otherwise all other systems reviewed and are negative.   Past Medical History:  Diagnosis Date  . Diabetes mellitus without complication ([facility])   . High cholesterol   . Hypertension     Past Surgical History:  Procedure Laterality Date  . HIP SURGERY     left  . KIDNEY SURGERY      Social History  reports that he has never smoked. He has never used smokeless tobacco. He reports that he does not drink alcohol and does not use drugs.  No Known Allergies  Family History  Family history unknown: Yes  Due to patient underlying cognitive impairment from dementia unable to cooperate with family  history.  Prior to Admission medications   Medication Sig Start Date End Date Taking? Authorizing Provider  acetaminophen (TYLENOL) 650 MG CR tablet Take 650 mg by mouth 2 (two) times daily.    [provider]  amLODipine (NORVASC) 5 MG tablet TAKE (1) TABLET BY MOUTH ONCE DAILY. Patient taking differently: Take 10 mg by mouth daily.  06/15/17   Raylene Everts, MD  ARTHRITIS PAIN RELIEF 650 MG CR tablet TAKE 1 TABLET BY MOUTH TWICE DAILY. **DO NOT CRUSH** 06/15/17   Raylene Everts, MD  aspirin EC 325 MG tablet Take 1 tablet (325 mg total) by mouth daily. 02/21/20   Evalee Jefferson, PA-C  atorvastatin (LIPITOR) 40 MG tablet Take 1 tablet (40 mg total) by mouth daily. 01/07/20 02/06/20  Darliss Cheney, MD  Cholecalciferol (VITAMIN D3) 2000 units capsule TAKE 1 CAPSULE BY MOUTH ONCE DAILY. **DO NOT CRUSH** 01/06/18   Fayrene Helper, MD  donepezil (ARICEPT) 10 MG tablet TAKE (1) TABLET BY MOUTH ONCE DAILY. 06/15/17   Raylene Everts, MD  FEROSUL 325 (65 Fe) MG tablet TAKE 1 TABLET BY MOUTH TWICE DAILY. Patient not taking: Reported on 01/05/2020 06/15/17   Raylene Everts, MD  FORA LANCETS MISC by Does not apply route. Test once daily    [provider]  glucose blood (FORA V30A BLOOD GLUCOSE TEST) test strip 1 each by Other route daily. Use as instructed    [provider]  hydrochlorothiazide (MICROZIDE) 12.5 MG capsule TAKE 1 CAPSULE BY MOUTH ONCE A DAY. 06/15/17   Raylene Everts, MD  hydrocortisone cream 1 % Apply 1  application topically 3 (three) times daily as needed for itching.    [provider]  memantine (NAMENDA) 5 MG tablet Take 1 tablet by mouth daily. 12/08/19   [provider]  metFORMIN (GLUCOPHAGE) 500 MG tablet TAKE 1 TABLET BY MOUTH TWICE DAILY. 06/15/17   Raylene Everts, MD  oxybutynin (DITROPAN-XL) 5 MG 24 hr tablet Take 5 mg by mouth daily. 01/04/20   [provider]  ramipril (ALTACE) 10 MG capsule TAKE 1 CAPSULE BY MOUTH  ONCE DAILY. **DO NOT CRUSH** 06/15/17   Raylene Everts, MD  tamsulosin (FLOMAX) 0.4 MG CAPS capsule TAKE 1 CAPSULE BY MOUTH AT BEDTIME. 06/15/17   Raylene Everts, MD  traZODone (DESYREL) 100 MG tablet Take 100 mg by mouth at bedtime. 01/04/20   [provider]    Physical Exam: Vitals:   06/05/20 2154 06/06/20 0017 06/06/20 0216 06/06/20 0551  BP: (!) 157/84 (!) 148/87 137/74 117/70  Pulse: (!) 106 (!) 117 (!) 114 (!) 116  Resp: (!) 22 20 18 20   Temp: (!) 97.4 F (36.3 C) 98.7 F (37.1 C)  99.6 F (37.6 C)  TempSrc: Oral Oral    SpO2: 96% 95% 93% 98%    Constitutional: NAD, calm, comfortable.  No chest pain, no nausea, no vomiting.  Following simple commands appropriately. Vitals:   06/05/20 2154 06/06/20 0017 06/06/20 0216 06/06/20 0551  BP: (!) 157/84 (!) 148/87 137/74 117/70  Pulse: (!) 106 (!) 117 (!) 114 (!) 116  Resp: (!) 22 20 18 20   Temp: (!) 97.4 F (36.3 C) 98.7 F (37.1 C)  99.6 F (37.6 C)  TempSrc: Oral Oral    SpO2: 96% 95% 93% 98%   Eyes: PERRL, lids and conjunctivae normal; no icterus, no nystagmus. ENMT: Mucous membranes are dry on examination. Posterior pharynx clear of any exudate or lesions. Neck: normal, supple, no masses, no thyromegaly; no JVD. Respiratory: clear to auscultation bilaterally, no wheezing, no crackles. Normal respiratory effort. No accessory muscle use.  Cardiovascular: Sinus tachycardia, no rubs, no gallops, no murmurs appreciated on exam. No extremity edema. 2+ pedal pulses. No carotid bruits.  Abdomen: no tenderness, no masses palpated. No hepatosplenomegaly. Bowel sounds positive.  Musculoskeletal: no clubbing / cyanosis. No joint deformity upper and lower extremities. Good ROM, no contractures. Normal muscle tone.  Skin: no rashes, lesions, ulcers. No induration Neurologic: CN 2-12 grossly intact.  No new focal deficits. Psychiatric: Normal mood.    Labs on Admission: I have personally reviewed following labs and  imaging studies  CBC: Recent Labs  Lab 06/05/20 2225  WBC 10.8*  HGB 11.6*  HCT 36.7*  MCV 89.3  PLT [id number]    Basic Metabolic Panel: Recent Labs  Lab 06/05/20 2225  NA 130*  K 3.7  CL 94*  CO2 21*  GLUCOSE 499*  BUN 59*  CREATININE 2.03*  CALCIUM 7.7*    GFR: CrCl cannot be calculated (Unknown ideal weight.).  Urine analysis:    Component Value Date/Time   COLORURINE YELLOW 06/05/2020 2216   APPEARANCEUR CLEAR 06/05/2020 2216   LABSPEC 1.014 06/05/2020 2216   PHURINE 5.0 06/05/2020 2216   GLUCOSEU >=500 (A) 06/05/2020 2216   HGBUR NEGATIVE 06/05/2020 2216   [place] NEGATIVE 06/05/2020 2216   KETONESUR NEGATIVE 06/05/2020 2216   PROTEINUR NEGATIVE 06/05/2020 2216   NITRITE NEGATIVE 06/05/2020 2216   LEUKOCYTESUR NEGATIVE 06/05/2020 2216    Radiological Exams on Admission: No results found.  EKG: Independently reviewed.  Sinus tachycardia, no acute ischemic  changes.  Assessment/Plan 1-AKI (acute kidney injury) (HCC) -In the setting of prerenal azotemia and dehydration most likely from hyperglycemia. -Will provide fluid resuscitation -Replete electrolytes and monitor on telemetry -Follow renal function trend -Depending response will get renal ultrasound. -Patient denying urinary retention symptoms -Continue home dose Flomax. -Urinalysis without signs of infection.  2-hyperglycemia -Will hold oral hypoglycemic agents in the setting of acute kidney injury and inpatient status. -Check A1c -Sliding scale insulin and Levemir will be ordered. -Follow CBGs fluctuation.  3-hypokalemia/hypomagnesemia/hypophosphatemia -In the setting of dehydration and home use diuretics -will replete electrolytes, monitoring on telemetry.  4-essential hypertension -Continue the use of Lopressor and amlodipine -Holding ramipril and hydrochlorothiazide in the setting of acute kidney injury -Heart healthy diet has been ordered.  5-history of BPH -Continue  Flomax.  6-history of dementia -Continue supportive care -Continue the use of Aricept.  7-prior history of non-hemorrhagic CVA -No new focal deficits -Continue risk factor modifications and the use of full dose aspirin.  8-hyperlipidemia -Continue Lipitor.  DVT prophylaxis: Heparin Code Status:   Full code Family Communication:  No family at bedside. Disposition Plan:   Patient is from:  Group home  Anticipated DC to:  Group home  Anticipated DC date:  1-2 days  Anticipated DC barriers: Stabilization of hyperglycemia and resolution of acute kidney injury.  Consults called:  None Admission status:  Inpatient, telemetry, length of stay more than 2 midnights.  Severity of Illness: Mild to moderate severity; with high risk for further decompensation in the setting of cognitive impairment and already acute kidney injury with deranged electrolytes.  Patient will be admitted for proper fluid resuscitation, control/stabilization of his CBGs and electrolyte repletion.   Vassie Loll MD [facility]  How to contact the [facility] Attending or Consulting provider 7A - 7P or covering provider during after hours 7P -7A, for this patient?   1. Check the care team in [facility] and look for a) attending/consulting TR[facility] provider listed and b) the [facility] team listed 2. Log into [email or web address] and use [place]'s universal password to access. If you do not have the password, please contact the hospital operator. 3. Locate the [facility] provider you are looking for under [facility] and page to a number that you can be directly reached. 4. If you still have difficulty reaching the provider, please page the [facility] (Director on Call) for the Hospitalists listed on amion for assistance.  06/06/2020, 7:21 AM

## 2020-06-06 NOTE — ED Notes (Signed)
Date and time results received: 06/06/20 0842 (use smartphrase ".now" to insert current time)  Test: phos  Critical Value: <1.0  Name of Provider Notified: Gwenlyn Perking  Orders Received? Or Actions Taken?:

## 2020-06-07 DIAGNOSIS — R739 Hyperglycemia, unspecified: Secondary | ICD-10-CM | POA: Diagnosis not present

## 2020-06-07 DIAGNOSIS — N179 Acute kidney failure, unspecified: Secondary | ICD-10-CM | POA: Diagnosis not present

## 2020-06-07 LAB — CBC
HCT: 33.4 % — ABNORMAL LOW (ref 39.0–52.0)
Hemoglobin: 11.1 g/dL — ABNORMAL LOW (ref 13.0–17.0)
MCH: 28.8 pg (ref 26.0–34.0)
MCHC: 33.2 g/dL (ref 30.0–36.0)
MCV: 86.8 fL (ref 80.0–100.0)
Platelets: 170 10*3/uL (ref 150–400)
RBC: 3.85 MIL/uL — ABNORMAL LOW (ref 4.22–5.81)
RDW: 13.9 % (ref 11.5–15.5)
WBC: 9.6 10*3/uL (ref 4.0–10.5)
nRBC: 0.2 % (ref 0.0–0.2)

## 2020-06-07 LAB — BASIC METABOLIC PANEL
Anion gap: 10 (ref 5–15)
BUN: 39 mg/dL — ABNORMAL HIGH (ref 8–23)
CO2: 28 mmol/L (ref 22–32)
Calcium: 8 mg/dL — ABNORMAL LOW (ref 8.9–10.3)
Chloride: 99 mmol/L (ref 98–111)
Creatinine, Ser: 1.25 mg/dL — ABNORMAL HIGH (ref 0.61–1.24)
GFR, Estimated: >60 mL/min (ref >60)
Glucose, Bld: 203 mg/dL — ABNORMAL HIGH (ref 70–99)
Potassium: 2.9 mmol/L — ABNORMAL LOW (ref 3.5–5.1)
Sodium: 137 mmol/L (ref 135–145)

## 2020-06-07 LAB — GLUCOSE, CAPILLARY
Glucose-Capillary: 289 mg/dL — ABNORMAL HIGH (ref 70–99)
Glucose-Capillary: 187 mg/dL — ABNORMAL HIGH (ref 70–99)
Glucose-Capillary: 155 mg/dL — ABNORMAL HIGH (ref 70–99)
Glucose-Capillary: 78 mg/dL (ref 70–99)
Glucose-Capillary: 93 mg/dL (ref 70–99)

## 2020-06-07 LAB — MAGNESIUM: Magnesium: 1 mg/dL — ABNORMAL LOW (ref 1.7–2.4)

## 2020-06-07 LAB — PHOSPHORUS: Phosphorus: 2.1 mg/dL — ABNORMAL LOW (ref 2.5–4.6)

## 2020-06-07 MED ORDER — POTASSIUM CHLORIDE CRYS ER 20 MEQ PO TBCR
40.0000 meq | EXTENDED_RELEASE_TABLET | ORAL | Status: AC
  Administered 2020-06-07 (×2): 40 meq via ORAL
  Filled 2020-06-07 (×2): qty 2

## 2020-06-07 MED ORDER — MAGNESIUM SULFATE 2 GM/50ML IV SOLN
2.0000 g | Freq: Once | INTRAVENOUS | Status: AC
  Administered 2020-06-07: 2 g via INTRAVENOUS
  Filled 2020-06-07: qty 50

## 2020-06-07 NOTE — Progress Notes (Signed)
PROGRESS NOTE    Daniel [name]  [id number] DOB: [birth year] DOA: 06/05/2020 PCP: Leonie Douglas, MD   Chief Complaint  Patient presents with  . Hyperglycemia    Brief Narrative:  Daniel [name] is a 69 y.o. male with medical history significant of type 2 diabetes, hyperlipidemia, hypertension, dementia, prior history of CVA and BPH; who presented to the emergency department from his group home in the setting of continued hyperglycemia.  There has not been any complaints of fever, shortness of breath, coughing, chest pain, nausea, vomiting or abdominal pain.  Patient mentation has reported to be at baseline; unfortunately due to underlying cognitive impairment patient is not able to collaborate much with history.  He expressed feeling okay, but reported increase urinary frequency.  ED Course: Patient found to be hyperglycemic, with acute kidney injury, tachycardic and with abnormal/the range electrolytes (hypokalemic, hypophosphatemic and hypomagnesemia).  No acute EKG abnormalities appreciated.  Fluid resuscitation and subcutaneous insulin provided.  Bicarb is overall stable and there was no signs of elevated anion gap to rule in DKA.   Assessment & Plan: 1-acute kidney injury in the setting of prerenal acidemia and dehydration -Renal function responding appropriately to fluid resuscitation -Continue to follow creatinine trend -Continue current IV fluids -Continue minimizing nephrotoxic agents.  2-uncontrolled type 2 diabetes with hyperglycemia -A1c 9.8 -Good response to the use of sliding scale insulin and Levemir -Once renal function back to normal anticipate discharge and adjusted dose of Metformin and once a week on CMP -Further close monitoring and future adjustment to hypoglycemic regimen as per patient's PCP.  3-essential hypertension -Continue the use of Lopressor and amlodipine -Continue holding ramipril and HCTZ in the setting of acute kidney  injury.  4-hypokalemia/hypomagnesemia/hypophosphatemia -Phosphorus close to normal after repletion -Potassium and magnesium is still low -Continue to replete as needed and follow trend.  5-BPH -No retention complaints -Continue Flomax.  6-history of dementia -No behavioral disorder appreciated. -Continue supportive care and the use of Aricept.  7-history of prior nonhemorrhagic CVA -No new focal deficits appreciated -Continue risk factor modifications and the use of full dose aspirin.  8-hyperlipidemia -Continue Lipitor.    DVT prophylaxis: Heparin Code Status: Full code Family Communication: No family at bedside.  Status is: Inpatient  Dispo: The patient is from: Group home              Anticipated d/c is to: Group home              Anticipated d/c date is: 1 day              Patient currently medically ready for discharge; still demonstrating electrolytes derangements and renal function now back to baseline.  Blood sugar much better control with the use of sliding scale insulin and Levemir.       Consultants:   None   Procedures: See below for x-ray reports.  Antimicrobials:  None  Subjective: No chest pain, no palpitations, no nausea, no vomiting, feeling better overall.  In no major distress.  Continues to have mild sinus tachycardia and based on blood work results still not back to baseline from a renal standpoint and with certain ongoing electrolyte derangements.  Objective: Vitals:   06/06/20 2058 06/07/20 0455 06/07/20 0828 06/07/20 1343  BP: 120/77 125/78 (!) 142/80 (!) 132/94  Pulse: (!) 108 (!) 106 (!) 109 97  Resp: 18 18  20   Temp: 98.2 F (36.8 C) 98.7 F (37.1 C)  99.3 F (37.4 C)  TempSrc:  Oral  SpO2: 98% 96%  97%  Weight:      Height:        Intake/Output Summary (Last 24 hours) at 06/07/2020 1625 Last data filed at 06/07/2020 1453 Gross per 24 hour  Intake 480 ml  Output --  Net 480 ml   Filed Weights   06/06/20 1140   Weight: 74.5 kg    Examination:  General exam: Appears calm and comfortable; reports no chest pain, no nausea, no vomiting.  Feeling better.  No palpitations. Respiratory system: Clear to auscultation. Respiratory effort normal.  No requiring oxygen supplementation. Cardiovascular system: Mild sinus tachycardia; no rubs, no gallops, no JVD.  No pedal edema Seen. Gastrointestinal system: Abdomen is nondistended, soft and nontender. No organomegaly or masses felt. Normal bowel sounds heard. Central nervous system: No new focal neurological deficits. Extremities: No cyanosis or clubbing. Skin: No petechiae. Psychiatry: Mood & affect appropriate.     Data Reviewed: I have personally reviewed following labs and imaging studies  CBC: Recent Labs  Lab 06/05/20 2225 06/06/20 0750 06/07/20 0538  WBC 10.8* 10.8* 9.6  HGB 11.6* 11.6* 11.1*  HCT 36.7* 34.6* 33.4*  MCV 89.3 87.4 86.8  PLT 205 188 [id number]    Basic Metabolic Panel: Recent Labs  Lab 06/05/20 2225 06/06/20 0750 06/07/20 0538  NA 130* 135 137  K 3.7 3.0* 2.9*  CL 94* 102 99  CO2 21* 21* 28  GLUCOSE 499* 318* 203*  BUN 59* 57* 39*  CREATININE 2.03* 1.49* 1.25*  CALCIUM 7.7* 7.5* 8.0*  MG  --  0.6* 1.0*  PHOS  --  <1.0* 2.1*    GFR: Estimated Creatinine Clearance: 60.4 mL/min (A) (by C-G formula based on SCr of 1.25 mg/dL (H)).  Liver Function Tests: Recent Labs  Lab 06/06/20 0750  AST 16  ALT 13  ALKPHOS 44  BILITOT 0.5  PROT 5.5*  ALBUMIN 2.9*    CBG: Recent Labs  Lab 06/06/20 1205 06/06/20 1640 06/06/20 2046 06/07/20 0805 06/07/20 1141  GLUCAP 234* 289* 142* 187* 155*     Recent Results (from the past 240 hour(s))  Resp Panel by RT-PCR (Flu A&B, Covid) Nasopharyngeal Swab     Status: None   Collection Time: 06/06/20  5:31 AM   Specimen: Nasopharyngeal Swab; Nasopharyngeal(NP) swabs in vial transport medium  Result Value Ref Range Status   SARS Coronavirus 2 by RT PCR NEGATIVE NEGATIVE  Final    Comment: (NOTE) SARS-CoV-2 target nucleic acids are NOT DETECTED.  The SARS-CoV-2 RNA is generally detectable in upper respiratory specimens during the acute phase of infection. The lowest concentration of SARS-CoV-2 viral copies this assay can detect is 138 copies/mL. A negative result does not preclude SARS-Cov-2 infection and should not be used as the sole basis for treatment or other patient management decisions. A negative result may occur with  improper specimen collection/handling, submission of specimen other than nasopharyngeal swab, presence of viral mutation(s) within the areas targeted by this assay, and inadequate number of viral copies(<138 copies/mL). A negative result must be combined with clinical observations, patient history, and epidemiological information. The expected result is Negative.  Fact Sheet for Patients:  EntrepreneurPulse.com.au  Fact Sheet for Healthcare Providers:  IncredibleEmployment.be  This test is no t yet approved or cleared by the [place] FDA and  has been authorized for detection and/or diagnosis of SARS-CoV-2 by FDA under an Emergency Use Authorization (EUA). This EUA will remain  in effect (meaning this test can be used) for the  duration of the COVID-19 declaration under Section 564(b)(1) of the Act, 21 U.S.C.section 360bbb-3(b)(1), unless the authorization is terminated  or revoked sooner.       Influenza A by PCR NEGATIVE NEGATIVE Final   Influenza B by PCR NEGATIVE NEGATIVE Final    Comment: (NOTE) The Xpert Xpress SARS-CoV-2/FLU/RSV plus assay is intended as an aid in the diagnosis of influenza from Nasopharyngeal swab specimens and should not be used as a sole basis for treatment. Nasal washings and aspirates are unacceptable for Xpert Xpress SARS-CoV-2/FLU/RSV testing.  Fact Sheet for Patients: BloggerCourse.com  Fact Sheet for Healthcare  Providers: SeriousBroker.it  This test is not yet approved or cleared by the [place] FDA and has been authorized for detection and/or diagnosis of SARS-CoV-2 by FDA under an Emergency Use Authorization (EUA). This EUA will remain in effect (meaning this test can be used) for the duration of the COVID-19 declaration under Section 564(b)(1) of the Act, 21 U.S.C. section 360bbb-3(b)(1), unless the authorization is terminated or revoked.  Performed at [facility], [address], [place], [place] [address]      Radiology Studies: No results found.   Scheduled Meds: . amLODipine  10 mg Oral Daily  . aspirin EC  325 mg Oral Daily  . atorvastatin  40 mg Oral Daily  . donepezil  10 mg Oral QHS  . heparin  5,000 Units Subcutaneous Q8H  . insulin aspart  0-15 Units Subcutaneous TID WC  . insulin aspart  0-5 Units Subcutaneous QHS  . insulin detemir  8 Units Subcutaneous BID  . memantine  5 mg Oral Daily  . metoprolol tartrate  25 mg Oral BID  . tamsulosin  0.4 mg Oral QHS  . traZODone  100 mg Oral QHS   Continuous Infusions:   LOS: 1 day    Time spent: 30 minutes   Vassie Loll, MD [facility]   To contact the attending provider between 7A-7P or the covering provider during after hours 7P-7A, please log into the web site [email or web address] and access using universal [place] password for that web site. If you do not have the password, please call the hospital operator.  06/07/2020, 4:25 PM

## 2020-06-07 NOTE — TOC Initial Note (Signed)
Transition of Care [facility]) - Initial/Assessment Note    Patient Details  Name: Daniel [name] MRN: [id number] Date of Birth: 04-Jan-1951  [facility] CM/SW Contact:    Annice Needy, LCSW Phone Number: 06/07/2020, 2:00 PM  Clinical Narrative:                 Patient is from [facility]. His legal guardian is Barnie Alderman, [phone], with [facility] DSS. Patient can return to the facility at discharge. Prior to 8/21, patient was his own guardian. Advised that patient may potentially discharge on 12/31. Per Efraim Kaufmann, facility staff will transport patient back to the facility.    Expected Discharge Plan: Group Home Barriers to Discharge: Continued Medical Work up   Patient Goals and CMS Choice Patient states their goals for this hospitalization and ongoing recovery are:: return to [facility]      Expected Discharge Plan and Services Expected Discharge Plan: Group Home       Living arrangements for the past 2 months: Group Home                                      Prior Living Arrangements/Services Living arrangements for the past 2 months: Group Home Lives with:: Facility Resident Patient language and need for interpreter reviewed:: Yes Do you feel safe going back to the place where you live?: Yes      Need for Family Participation in Patient Care: Yes (Comment) Care giver support system in place?: Yes (comment)   Criminal Activity/Legal Involvement Pertinent to Current Situation/Hospitalization: No - Comment as needed  Activities of Daily Living Home Assistive Devices/Equipment: CBG Meter ADL Screening (condition at time of admission) Patient's cognitive ability adequate to safely complete daily activities?: Yes Is the patient deaf or have difficulty hearing?: No Does the patient have difficulty seeing, even when wearing glasses/contacts?: No Does the patient have difficulty concentrating, remembering, or making decisions?:  Yes Patient able to express need for assistance with ADLs?: No Does the patient have difficulty dressing or bathing?: Yes Independently performs ADLs?: No Communication: Independent Dressing (OT): Needs assistance Is this a change from baseline?: Pre-admission baseline Grooming: Needs assistance Is this a change from baseline?: Pre-admission baseline Feeding: Needs assistance Is this a change from baseline?: Pre-admission baseline Bathing: Needs assistance Is this a change from baseline?: Pre-admission baseline Toileting: Needs assistance Is this a change from baseline?: Pre-admission baseline In/Out Bed: Needs assistance Is this a change from baseline?: Pre-admission baseline Walks in Home: Independent Does the patient have difficulty walking or climbing stairs?: Yes Weakness of Legs: Both Weakness of Arms/Hands: None  Permission Sought/Granted Permission sought to share information with : Guardian    Share Information with NAME: Joni Fears     Permission granted to share info w Relationship: Legal Guardian [phone]     Emotional Assessment Appearance:: Appears stated age   Affect (typically observed): Appropriate Orientation: : Oriented to Self Alcohol / Substance Use: Not Applicable Psych Involvement: No (comment)  Admission diagnosis:  Hyperglycemia [R73.9] AKI (acute kidney injury) (HCC) [N17.9] Patient Active Problem List   Diagnosis Date Noted  . AKI (acute kidney injury) (HCC) 06/06/2020  . Acute CVA (cerebrovascular accident) (HCC) 01/05/2020  . Right knee pain 01/01/2017  . Osteoarthritis of right knee 01/01/2017  . Mental impairment 09/23/2016  . Hypertension 09/23/2016  . Type 2 diabetes mellitus without complication, without long-term current  use of insulin (HCC) 09/23/2016  . BPH (benign prostatic hyperplasia) 09/23/2016  . Dementia (HCC) 09/23/2016  . 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   PCP:  Waldon Reining, MD Pharmacy:    [facility] - [place], [place] - [address] [place] [address] Phone: [phone] Fax: [phone]     Social Determinants of Health (SDOH) Interventions    Readmission Risk Interventions No flowsheet data found.

## 2020-06-08 DIAGNOSIS — Z8673 Personal history of transient ischemic attack (TIA), and cerebral infarction without residual deficits: Secondary | ICD-10-CM

## 2020-06-08 DIAGNOSIS — E119 Type 2 diabetes mellitus without complications: Secondary | ICD-10-CM | POA: Diagnosis not present

## 2020-06-08 DIAGNOSIS — R739 Hyperglycemia, unspecified: Secondary | ICD-10-CM

## 2020-06-08 DIAGNOSIS — E876 Hypokalemia: Secondary | ICD-10-CM

## 2020-06-08 DIAGNOSIS — E1165 Type 2 diabetes mellitus with hyperglycemia: Secondary | ICD-10-CM

## 2020-06-08 DIAGNOSIS — N179 Acute kidney failure, unspecified: Secondary | ICD-10-CM | POA: Diagnosis not present

## 2020-06-08 LAB — BASIC METABOLIC PANEL
Anion gap: 8 (ref 5–15)
BUN: 28 mg/dL — ABNORMAL HIGH (ref 8–23)
CO2: 29 mmol/L (ref 22–32)
Calcium: 8.9 mg/dL (ref 8.9–10.3)
Chloride: 98 mmol/L (ref 98–111)
Creatinine, Ser: 1.17 mg/dL (ref 0.61–1.24)
GFR, Estimated: >60 mL/min (ref >60)
Glucose, Bld: 68 mg/dL — ABNORMAL LOW (ref 70–99)
Potassium: 3.1 mmol/L — ABNORMAL LOW (ref 3.5–5.1)
Sodium: 135 mmol/L (ref 135–145)

## 2020-06-08 LAB — MAGNESIUM: Magnesium: 1.3 mg/dL — ABNORMAL LOW (ref 1.7–2.4)

## 2020-06-08 LAB — GLUCOSE, CAPILLARY
Glucose-Capillary: 58 mg/dL — ABNORMAL LOW (ref 70–99)
Glucose-Capillary: 175 mg/dL — ABNORMAL HIGH (ref 70–99)

## 2020-06-08 MED ORDER — DEXTROSE 50 % IV SOLN
50.0000 mL | Freq: Once | INTRAVENOUS | Status: AC
  Administered 2020-06-08: 50 mL via INTRAVENOUS
  Filled 2020-06-08: qty 50

## 2020-06-08 MED ORDER — POTASSIUM CHLORIDE CRYS ER 20 MEQ PO TBCR
40.0000 meq | EXTENDED_RELEASE_TABLET | Freq: Once | ORAL | Status: AC
  Administered 2020-06-08: 40 meq via ORAL
  Filled 2020-06-08: qty 2

## 2020-06-08 MED ORDER — METFORMIN HCL 1000 MG PO TABS: 1000.0000 mg | ORAL_TABLET | Freq: Two times a day (BID) | ORAL | 3 refills | Status: AC

## 2020-06-08 MED ORDER — OZEMPIC (0.25 OR 0.5 MG/DOSE) 2 MG/1.5ML ~~LOC~~ SOPN: 0.5000 mg | PEN_INJECTOR | SUBCUTANEOUS | 2 refills | Status: AC

## 2020-06-08 MED ORDER — RAMIPRIL 10 MG PO CAPS: 20.0000 mg | ORAL_CAPSULE | Freq: Every day | ORAL | 3 refills | Status: DC

## 2020-06-08 MED ORDER — MAGNESIUM SULFATE 2 GM/50ML IV SOLN
2.0000 g | Freq: Once | INTRAVENOUS | Status: AC
  Administered 2020-06-08: 2 g via INTRAVENOUS
  Filled 2020-06-08: qty 50

## 2020-06-08 MED ORDER — AMLODIPINE BESYLATE 10 MG PO TABS: 10.0000 mg | ORAL_TABLET | Freq: Every day | ORAL | 3 refills | Status: DC

## 2020-06-08 MED ORDER — MAGNESIUM OXIDE 400 MG PO TABS: 400.0000 mg | ORAL_TABLET | Freq: Every day | ORAL | 2 refills | Status: DC

## 2020-06-08 MED ORDER — POTASSIUM CHLORIDE ER 10 MEQ PO TBCR: 20.0000 meq | EXTENDED_RELEASE_TABLET | Freq: Every day | ORAL | 0 refills | Status: DC

## 2020-06-08 NOTE — Discharge Summary (Signed)
Physician Discharge Summary  Daniel [name] ZJI:[id number] DOB: 1951/02/25 DOA: 06/05/2020  PCP: Waldon Reining, MD  Admit date: 06/05/2020 Discharge date: 06/08/2020  Time spent: 35 minutes  Recommendations for Outpatient Follow-up:  1. Repeat basic metabolic panel to evaluate lites and renal function 2. Reassess blood pressure and adjust antihypertensive treatment as needed 3. Close monitoring of Daniel [name]'s CBGs with further adjustment to hypoglycemic regimen as required.   Discharge Diagnoses:  AKI (acute kidney injury) (HCC) History of nonhemorrhagic stroke Hypertension Hyperlipidemia Hypokalemia Hypomagnesemia Poorly controlled type 2 diabetes mellitus with hyperglycemia and A1c of 9.8. Dementia History of BPH  Discharge Condition: Stable and improved.  Discharged home with instruction to follow-up with PCP in 10 days.  CODE STATUS: Full code.  Diet recommendation: Heart healthy/modified carbohydrate diet.  Filed Weights   06/06/20 1140 06/08/20 0511  Weight: 74.5 kg 74.1 kg    History of present illness:   Daniel [name] a 69 y.o.malewith medical history significant oftype 2 diabetes, hyperlipidemia, hypertension,dementia,prior history of CVA and BPH;who presented to the emergency department from [name] group home in the setting of continued hyperglycemia.There has not been any complaints of fever, shortness of breath, coughing, chest pain, nausea, vomiting or abdominal pain. Daniel [name] mentation has reported to be at baseline; unfortunately due to underlying cognitive impairment Daniel [name] is not able to collaborate much with history. He expressed feeling okay,but reported increase urinary frequency.  ED Course:Daniel [name] found to be hyperglycemic, with acute kidney injury,tachycardic and with abnormal/the range electrolytes (hypokalemic, hypophosphatemic and hypomagnesemia). No acute EKG abnormalities appreciated. Fluid resuscitation and subcutaneous insulin  provided. Bicarb is overall stable and there was no signs of elevated anion gap to rule inDKA.   Hospital Course:  1-acute kidney injury in the setting of prerenal acidemia and dehydration -Renal function responded appropriately to fluid resuscitation and avoidance of nephrotoxic agents. -Creatinine is back to normal -Daniel [name] advised to maintain adequate hydration -Repeat basic metabolic panel to follow renal function and stability to follow-up visit.  2-uncontrolled type 2 diabetes with hyperglycemia -A1c 9.8 -Good response to the use of sliding scale insulin and Levemir -Once renal function back to normal anticipate discharge on adjusted dose of Metformin and once a week Ozempic  -Close monitoring and future adjustment to hypoglycemic regimen as per Daniel [name]'s PCP.  3-essential hypertension -Continue the use of Lopressor and amlodipine -Will discontinue hydrochlorothiazide and clonidine. -Continue the use of ramipril at adjusted dose. -Blood pressure has remained stable and well-controlled -Reassess blood pressure at follow-up visit and further adjust antihypertensive treatment as required.  4-hypokalemia/hypomagnesemia/hypophosphatemia -Electrolytes have been repleted accordingly -At discharge oral magnesium and potassium supplementation for maintenance purposes has been prescribed. -Repeat basic metabolic panel follow-up visit to reassess electrolytes trend and stability.  5-BPH -No retention complaints -Continue Flomax.  6-history of dementia -No behavioral disorder appreciated. -Continue supportive care and the use of Aricept and Namenda.  7-history of prior nonhemorrhagic CVA -No new focal deficits appreciated -Continue risk factor modifications and the use of full dose aspirin and Plavix.Marland Kitchen  8-hyperlipidemia -Continue Lipitor. -Heart healthy diet encouraged.   Procedures: See below for x-ray reports.  Consultations:  None  Discharge Exam: Vitals:    06/07/20 2032 06/08/20 0452  BP: 130/88 122/73  Pulse: (!) 106 99  Resp: 19 18  Temp: (!) 97.2 F (36.2 C) 97.8 F (36.6 C)  SpO2: 98% 93%    General: Afebrile, no chest pain, no nausea, no vomiting, drinking and eating without problems.  Daniel [name] will be discharged back to [name]  group home today. Cardiovascular: S1-S2, no rubs, no gallops, no JVD. Respiratory: Clear to auscultation bilaterally; no using accessory muscle. Abdomen: Soft, nontender, distended, positive bowel sound Extremities: No cyanosis or clubbing.  Discharge Instructions   Discharge Instructions    Diet - low sodium heart healthy   Complete by: As directed    Diet Carb Modified   Complete by: As directed    Discharge instructions   Complete by: As directed    Follow modify carbohydrates in her healthy diet Maintain adequate hydration Arrange follow-up with PCP in 10 days Take medications as prescribed   Increase activity slowly   Complete by: As directed      Allergies as of 06/08/2020   No Known Allergies     Medication List    STOP taking these medications   cloNIDine 0.1 MG tablet Commonly known as: CATAPRES   hydrochlorothiazide 12.5 MG capsule Commonly known as: MICROZIDE     TAKE these medications   acetaminophen 650 MG CR tablet Commonly known as: TYLENOL Take 650 mg by mouth 2 (two) times daily. What changed: Another medication with the same name was removed. Continue taking this medication, and follow the directions you see here.   amitriptyline 10 MG tablet Commonly known as: ELAVIL Take 10 mg by mouth at bedtime.   amLODipine 10 MG tablet Commonly known as: NORVASC Take 1 tablet (10 mg total) by mouth daily. What changed:   medication strength  See the new instructions.   aspirin EC 325 MG tablet Take 1 tablet (325 mg total) by mouth daily.   atorvastatin 40 MG tablet Commonly known as: LIPITOR Take 1 tablet (40 mg total) by mouth daily.   clopidogrel 75 MG  tablet Commonly known as: PLAVIX Take 75 mg by mouth daily.   donepezil 10 MG tablet Commonly known as: ARICEPT TAKE (1) TABLET BY MOUTH ONCE DAILY. What changed: See the new instructions.   FeroSul 325 (65 FE) MG tablet Generic drug: ferrous sulfate TAKE 1 TABLET BY MOUTH TWICE DAILY. What changed:   how much to take  when to take this   FORA Lancets Misc by Does not apply route. Test once daily   gentian violet 1 % topical solution Apply 1 mL topically See admin instructions. Applied between toes daily   glucose blood test strip 1 each by Other route daily. Use as instructed   magnesium oxide 400 MG tablet Commonly known as: MAG-OX Take 1 tablet (400 mg total) by mouth daily.   memantine 5 MG tablet Commonly known as: NAMENDA Take 10 mg by mouth 2 (two) times daily.   metFORMIN 1000 MG tablet Commonly known as: GLUCOPHAGE Take 1 tablet (1,000 mg total) by mouth 2 (two) times daily with a meal. What changed:   medication strength  how much to take  when to take this   metoprolol tartrate 25 MG tablet Commonly known as: LOPRESSOR Take 25 mg by mouth 2 (two) times daily.   oxybutynin 5 MG 24 hr tablet Commonly known as: DITROPAN-XL Take 5 mg by mouth daily.   Ozempic (0.25 or 0.5 MG/DOSE) 2 MG/1.5ML Sopn Generic drug: Semaglutide(0.25 or 0.5MG /DOS) Inject 0.5 mg into the skin once a week.   potassium chloride 10 MEQ tablet Commonly known as: KLOR-CON Take 2 tablets (20 mEq total) by mouth daily.   ramipril 10 MG capsule Commonly known as: ALTACE Take 2 capsules (20 mg total) by mouth daily. What changed: See the new instructions.   tamsulosin 0.4 MG  Caps capsule Commonly known as: FLOMAX TAKE 1 CAPSULE BY MOUTH AT BEDTIME.   traZODone 100 MG tablet Commonly known as: DESYREL Take 100 mg by mouth at bedtime.   Vitamin D3 50 MCG (2000 UT) capsule TAKE 1 CAPSULE BY MOUTH ONCE DAILY. **DO NOT CRUSH** What changed: See the new instructions.       No Known Allergies  Follow-up Information    Leonie Douglas, MD. Schedule an appointment as soon as possible for a visit in 10 day(s).   Specialties: Family Medicine, Sports Medicine Contact information: [address] [place] [address] [phone]               The results of significant diagnostics from this hospitalization (including imaging, microbiology, ancillary and laboratory) are listed below for reference.    Significant Diagnostic Studies: No results found.  Microbiology: Recent Results (from the past 240 hour(s))  Resp Panel by RT-PCR (Flu A&B, Covid) Nasopharyngeal Swab     Status: None   Collection Time: 06/06/20  5:31 AM   Specimen: Nasopharyngeal Swab; Nasopharyngeal(NP) swabs in vial transport medium  Result Value Ref Range Status   SARS Coronavirus 2 by RT PCR NEGATIVE NEGATIVE Final    Comment: (NOTE) SARS-CoV-2 target nucleic acids are NOT DETECTED.  The SARS-CoV-2 RNA is generally detectable in upper respiratory specimens during the acute phase of infection. The lowest concentration of SARS-CoV-2 viral copies this assay can detect is 138 copies/mL. A negative result does not preclude SARS-Cov-2 infection and should not be used as the sole basis for treatment or other Daniel [name] management decisions. A negative result may occur with  improper specimen collection/handling, submission of specimen other than nasopharyngeal swab, presence of viral mutation(s) within the areas targeted by this assay, and inadequate number of viral copies(<138 copies/mL). A negative result must be combined with clinical observations, Daniel [name] history, and epidemiological information. The expected result is Negative.  Fact Sheet for Patients:  EntrepreneurPulse.com.au  Fact Sheet for Healthcare Providers:  IncredibleEmployment.be  This test is no t yet approved or cleared by the [place] FDA and  has been authorized for  detection and/or diagnosis of SARS-CoV-2 by FDA under an Emergency Use Authorization (EUA). This EUA will remain  in effect (meaning this test can be used) for the duration of the COVID-19 declaration under Section 564(b)(1) of the Act, 21 U.S.C.section 360bbb-3(b)(1), unless the authorization is terminated  or revoked sooner.       Influenza A by PCR NEGATIVE NEGATIVE Final   Influenza B by PCR NEGATIVE NEGATIVE Final    Comment: (NOTE) The Xpert Xpress SARS-CoV-2/FLU/RSV plus assay is intended as an aid in the diagnosis of influenza from Nasopharyngeal swab specimens and should not be used as a sole basis for treatment. Nasal washings and aspirates are unacceptable for Xpert Xpress SARS-CoV-2/FLU/RSV testing.  Fact Sheet for Patients: EntrepreneurPulse.com.au  Fact Sheet for Healthcare Providers: IncredibleEmployment.be  This test is not yet approved or cleared by the [place] FDA and has been authorized for detection and/or diagnosis of SARS-CoV-2 by FDA under an Emergency Use Authorization (EUA). This EUA will remain in effect (meaning this test can be used) for the duration of the COVID-19 declaration under Section 564(b)(1) of the Act, 21 U.S.C. section 360bbb-3(b)(1), unless the authorization is terminated or revoked.  Performed at [facility], [address], [place], [place] [address]      Labs: Basic Metabolic Panel: Recent Labs  Lab 06/05/20 2225 06/06/20 [id number] 06/07/20 [id number] 06/08/20 [id number]  NA 130* 135 137 135  K 3.7 3.0* 2.9* 3.1*  CL 94* 102 99 98  CO2 21* 21* 28 29  GLUCOSE 499* 318* 203* 68*  BUN 59* 57* 39* 28*  CREATININE 2.03* 1.49* 1.25* 1.17  CALCIUM 7.7* 7.5* 8.0* 8.9  MG  --  0.6* 1.0* 1.3*  PHOS  --  <1.0* 2.1*  --    Liver Function Tests: Recent Labs  Lab 06/06/20 0750  AST 16  ALT 13  ALKPHOS 44  BILITOT 0.5  PROT 5.5*  ALBUMIN 2.9*   CBC: Recent Labs  Lab 06/05/20 2225 06/06/20 0750  06/07/20 0538  WBC 10.8* 10.8* 9.6  HGB 11.6* 11.6* 11.1*  HCT 36.7* 34.6* 33.4*  MCV 89.3 87.4 86.8  PLT 205 188 170    CBG: Recent Labs  Lab 06/07/20 0805 06/07/20 1141 06/07/20 1655 06/07/20 2123 06/08/20 0801  GLUCAP 187* 155* 78 93 58*    Signed:  Barton Dubois MD.  [facility] 06/08/2020, 8:12 AM

## 2020-06-08 NOTE — NC FL2 (Signed)
[place] LEVEL OF CARE SCREENING TOOL     IDENTIFICATION  Patient Name: Daniel [name] Birthdate: [birth year] Sex: male Admission Date (Current Location): 06/05/2020  [place] and [place] Number:  [place] [id number] [place] and Address:  [facility] [address] [place]      Provider Number: [id number]  Attending Physician Name and Address:  Barton Dubois, MD  Relative Name and Phone Number:  [name] ([place])   [phone]    Current Level of Care: Hospital Recommended Level of Care: Family Care Home Prior Approval Number:    Date Approved/Denied:   PASRR Number:    Discharge Plan: Domiciliary (Rest home)    Current Diagnoses: Patient Active Problem List   Diagnosis Date Noted  . Hyperglycemia   . History of stroke   . Hypokalemia   . Hypomagnesemia   . AKI (acute kidney injury) ([place]) 06/06/2020  . Acute CVA (cerebrovascular accident) ([place]) 01/05/2020  . Right knee pain 01/01/2017  . Osteoarthritis of right knee 01/01/2017  . Mental impairment 09/23/2016  . Hypertension 09/23/2016  . Type 2 diabetes mellitus without complication, without long-term current use of insulin ([place]) 09/23/2016  . BPH (benign prostatic hyperplasia) 09/23/2016  . Dementia ([place]) 09/23/2016  . 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    Orientation RESPIRATION BLADDER Height & Weight     Self  Normal Continent Weight: 74.1 kg Height:  5\' 11"  (180.3 cm)  BEHAVIORAL SYMPTOMS/MOOD NEUROLOGICAL BOWEL NUTRITION STATUS      Continent Diet (heart healthy/carb modified)  AMBULATORY STATUS COMMUNICATION OF NEEDS Skin   Limited Assist Verbally Normal                       Personal Care Assistance Level of Assistance  Bathing,Feeding,Dressing Bathing Assistance: Limited assistance Feeding assistance: Independent Dressing Assistance: Limited assistance     Functional Limitations Info  Sight,Hearing,Speech Sight Info:  Adequate Hearing Info: Adequate Speech Info: Adequate    SPECIAL CARE FACTORS FREQUENCY                       Contractures Contractures Info: Not present    Additional Factors Info  Psychotropic     Psychotropic Info: Desyrel         Current Medications (06/08/2020):  This is the current hospital active medication list Current Facility-Administered Medications  Medication Dose Route Frequency Provider Last Rate Last Admin  . acetaminophen (TYLENOL) tablet 650 mg  650 mg Oral Q6H PRN Barton Dubois, MD       Or  . acetaminophen (TYLENOL) suppository 650 mg  650 mg Rectal Q6H PRN Barton Dubois, MD      . amLODipine (NORVASC) tablet 10 mg  10 mg Oral Daily Barton Dubois, MD   10 mg at 06/08/20 0933  . aspirin EC tablet 325 mg  325 mg Oral Daily Barton Dubois, MD   325 mg at 06/08/20 [id number]  . atorvastatin (LIPITOR) tablet 40 mg  40 mg Oral Daily Barton Dubois, MD   40 mg at 06/08/20 0933  . donepezil (ARICEPT) tablet 10 mg  10 mg Oral QHS Barton Dubois, MD   10 mg at 06/07/20 2220  . heparin injection 5,000 Units  5,000 Units Subcutaneous Q8H Barton Dubois, MD   5,000 Units at 06/08/20 0551  . insulin aspart (novoLOG) injection 0-15 Units  0-15 Units Subcutaneous TID WC Barton Dubois, MD   3 Units at 06/08/20 0900  .  insulin aspart (novoLOG) injection 0-5 Units  0-5 Units Subcutaneous QHS Vassie Loll, MD      . insulin detemir (LEVEMIR) injection 8 Units  8 Units Subcutaneous BID Vassie Loll, MD   8 Units at 06/08/20 [phone]  . memantine (NAMENDA) tablet 5 mg  5 mg Oral Daily Vassie Loll, MD   5 mg at 06/08/20 9604  . metoprolol tartrate (LOPRESSOR) tablet 25 mg  25 mg Oral BID Vassie Loll, MD   25 mg at 06/08/20 0933  . ondansetron (ZOFRAN) tablet 4 mg  4 mg Oral Q6H PRN Vassie Loll, MD       Or  . ondansetron [facility]) injection 4 mg  4 mg Intravenous Q6H PRN Vassie Loll, MD      . tamsulosin (FLOMAX) capsule 0.4 mg  0.4 mg Oral QHS Vassie Loll, MD    0.4 mg at 06/07/20 2220  . traZODone (DESYREL) tablet 100 mg  100 mg Oral QHS Vassie Loll, MD   100 mg at 06/07/20 2220     Discharge Medications: Medication List    STOP taking these medications   cloNIDine 0.1 MG tablet Commonly known as: CATAPRES   hydrochlorothiazide 12.5 MG capsule Commonly known as: MICROZIDE     TAKE these medications   acetaminophen 650 MG CR tablet Commonly known as: TYLENOL Take 650 mg by mouth 2 (two) times daily. What changed: Another medication with the same name was removed. Continue taking this medication, and follow the directions you see here.   amitriptyline 10 MG tablet Commonly known as: ELAVIL Take 10 mg by mouth at bedtime.   amLODipine 10 MG tablet Commonly known as: NORVASC Take 1 tablet (10 mg total) by mouth daily. What changed:   medication strength  See the new instructions.   aspirin EC 325 MG tablet Take 1 tablet (325 mg total) by mouth daily.   atorvastatin 40 MG tablet Commonly known as: LIPITOR Take 1 tablet (40 mg total) by mouth daily.   clopidogrel 75 MG tablet Commonly known as: PLAVIX Take 75 mg by mouth daily.   donepezil 10 MG tablet Commonly known as: ARICEPT TAKE (1) TABLET BY MOUTH ONCE DAILY. What changed: See the new instructions.   FeroSul 325 (65 FE) MG tablet Generic drug: ferrous sulfate TAKE 1 TABLET BY MOUTH TWICE DAILY. What changed:   how much to take  when to take this   FORA Lancets Misc by Does not apply route. Test once daily   gentian violet 1 % topical solution Apply 1 mL topically See admin instructions. Applied between toes daily   glucose blood test strip 1 each by Other route daily. Use as instructed   magnesium oxide 400 MG tablet Commonly known as: MAG-OX Take 1 tablet (400 mg total) by mouth daily.   memantine 5 MG tablet Commonly known as: NAMENDA Take 10 mg by mouth 2 (two) times daily.   metFORMIN 1000 MG tablet Commonly known as:  GLUCOPHAGE Take 1 tablet (1,000 mg total) by mouth 2 (two) times daily with a meal. What changed:   medication strength  how much to take  when to take this   metoprolol tartrate 25 MG tablet Commonly known as: LOPRESSOR Take 25 mg by mouth 2 (two) times daily.   oxybutynin 5 MG 24 hr tablet Commonly known as: DITROPAN-XL Take 5 mg by mouth daily.   Ozempic (0.25 or 0.5 MG/DOSE) 2 MG/1.5ML Sopn Generic drug: Semaglutide(0.25 or 0.5MG /DOS) Inject 0.5 mg into the skin once a week.  potassium chloride 10 MEQ tablet Commonly known as: KLOR-CON Take 2 tablets (20 mEq total) by mouth daily.   ramipril 10 MG capsule Commonly known as: ALTACE Take 2 capsules (20 mg total) by mouth daily. What changed: See the new instructions.   tamsulosin 0.4 MG Caps capsule Commonly known as: FLOMAX TAKE 1 CAPSULE BY MOUTH AT BEDTIME.   traZODone 100 MG tablet Commonly known as: DESYREL Take 100 mg by mouth at bedtime.   Vitamin D3 50 MCG (2000 UT) capsule TAKE 1 CAPSULE BY MOUTH ONCE DAILY. **DO NOT CRUSH** What changed: See the new instructions.      No Known Allergies   Relevant Imaging Results:  Relevant Lab Results:   Additional Information    Boneta Lucks, RN

## 2020-06-08 NOTE — Progress Notes (Signed)
Discharge instructions given to nurse [facility]. Answered all questions at this time. All belongings sent with patient.

## 2020-06-08 NOTE — Care Management Important Message (Signed)
Important Message  Patient Details  Name: Daniel [name] MRN: [id number] Date of Birth: January 01, 1951   Medicare Important Message Given:  Yes     Corey Harold 06/08/2020, 11:51 AM

## 2020-06-08 NOTE — Plan of Care (Signed)

## 2020-06-08 NOTE — Plan of Care (Signed)
  Problem: Education: Goal: Knowledge of General Education information will improve Description: Including pain rating scale, medication(s)/side effects and non-pharmacologic comfort measures 06/08/2020 1040 by Cameron Ali, RN Outcome: Completed/Met 06/08/2020 1036 by Cameron Ali, RN Outcome: Progressing   Problem: Health Behavior/Discharge Planning: Goal: Ability to manage health-related needs will improve 06/08/2020 1040 by Cameron Ali, RN Outcome: Completed/Met 06/08/2020 1036 by Cameron Ali, RN Outcome: Progressing   Problem: Clinical Measurements: Goal: Ability to maintain clinical measurements within normal limits will improve 06/08/2020 1040 by Cameron Ali, RN Outcome: Completed/Met 06/08/2020 1036 by Cameron Ali, RN Outcome: Progressing Goal: Will remain free from infection 06/08/2020 1040 by Cameron Ali, RN Outcome: Completed/Met 06/08/2020 1036 by Cameron Ali, RN Outcome: Progressing Goal: Diagnostic test results will improve 06/08/2020 1040 by Cameron Ali, RN Outcome: Completed/Met 06/08/2020 1036 by Cameron Ali, RN Outcome: Progressing Goal: Respiratory complications will improve 06/08/2020 1040 by Cameron Ali, RN Outcome: Completed/Met 06/08/2020 1036 by Cameron Ali, RN Outcome: Progressing Goal: Cardiovascular complication will be avoided 06/08/2020 1040 by Cameron Ali, RN Outcome: Completed/Met 06/08/2020 1036 by Cameron Ali, RN Outcome: Progressing   Problem: Activity: Goal: Risk for activity intolerance will decrease 06/08/2020 1040 by Cameron Ali, RN Outcome: Completed/Met 06/08/2020 1036 by Cameron Ali, RN Outcome: Progressing   Problem: Nutrition: Goal: Adequate nutrition will be maintained 06/08/2020 1040 by Cameron Ali, RN Outcome: Completed/Met 06/08/2020 1036 by Cameron Ali, RN Outcome: Progressing   Problem: Coping: Goal: Level  of anxiety will decrease 06/08/2020 1040 by Cameron Ali, RN Outcome: Completed/Met 06/08/2020 1036 by Cameron Ali, RN Outcome: Progressing   Problem: Elimination: Goal: Will not experience complications related to bowel motility 06/08/2020 1040 by Cameron Ali, RN Outcome: Completed/Met 06/08/2020 1036 by Cameron Ali, RN Outcome: Progressing Goal: Will not experience complications related to urinary retention 06/08/2020 1040 by Cameron Ali, RN Outcome: Completed/Met 06/08/2020 1036 by Cameron Ali, RN Outcome: Progressing   Problem: Pain Managment: Goal: General experience of comfort will improve 06/08/2020 1040 by Cameron Ali, RN Outcome: Completed/Met 06/08/2020 1036 by Cameron Ali, RN Outcome: Progressing   Problem: Safety: Goal: Ability to remain free from injury will improve 06/08/2020 1040 by Cameron Ali, RN Outcome: Completed/Met 06/08/2020 1036 by Cameron Ali, RN Outcome: Progressing   Problem: Skin Integrity: Goal: Risk for impaired skin integrity will decrease 06/08/2020 1040 by Cameron Ali, RN Outcome: Completed/Met 06/08/2020 1036 by Cameron Ali, RN Outcome: Progressing

## 2020-06-09 ENCOUNTER — Encounter (HOSPITAL_COMMUNITY): Payer: Self-pay | Admitting: Emergency Medicine

## 2020-06-09 ENCOUNTER — Emergency Department (HOSPITAL_COMMUNITY): Payer: Medicare Other

## 2020-06-09 ENCOUNTER — Emergency Department (HOSPITAL_COMMUNITY)
Admission: EM | Admit: 2020-06-09 | Discharge: 2020-06-09 | Disposition: A | Discharge status: Home / Self Care | Payer: Medicare Other | Attending: Emergency Medicine | Admitting: Emergency Medicine

## 2020-06-09 ENCOUNTER — Other Ambulatory Visit: Payer: Self-pay

## 2020-06-09 DIAGNOSIS — R1084 Generalized abdominal pain: Secondary | ICD-10-CM | POA: Diagnosis present

## 2020-06-09 DIAGNOSIS — R188 Other ascites: Secondary | ICD-10-CM | POA: Diagnosis not present

## 2020-06-09 DIAGNOSIS — Z9289 Personal history of other medical treatment: Secondary | ICD-10-CM | POA: Insufficient documentation

## 2020-06-09 DIAGNOSIS — R079 Chest pain, unspecified: Secondary | ICD-10-CM

## 2020-06-09 DIAGNOSIS — E039 Hypothyroidism, unspecified: Secondary | ICD-10-CM | POA: Insufficient documentation

## 2020-06-09 LAB — CBC WITH DIFFERENTIAL/PLATELET
Abs Immature Granulocytes: 0.03 10*3/uL (ref 0.00–0.07)
Basophils Absolute: 0 10*3/uL (ref 0.0–0.1)
Basophils Relative: 0 %
Eosinophils Absolute: 0 10*3/uL (ref 0.0–0.5)
Eosinophils Relative: 0 %
HCT: 34.1 % — ABNORMAL LOW (ref 39.0–52.0)
Hemoglobin: 11.2 g/dL — ABNORMAL LOW (ref 13.0–17.0)
Immature Granulocytes: 0 %
Lymphocytes Relative: 14 %
Lymphs Abs: 1.1 10*3/uL (ref 0.7–4.0)
MCH: 28.6 pg (ref 26.0–34.0)
MCHC: 32.8 g/dL (ref 30.0–36.0)
MCV: 87.2 fL (ref 80.0–100.0)
Monocytes Absolute: 0.6 10*3/uL (ref 0.1–1.0)
Monocytes Relative: 9 %
Neutro Abs: 5.6 10*3/uL (ref 1.7–7.7)
Neutrophils Relative %: 77 %
Platelets: 203 10*3/uL (ref 150–400)
RBC: 3.91 MIL/uL — ABNORMAL LOW (ref 4.22–5.81)
RDW: 14 % (ref 11.5–15.5)
WBC: 7.4 10*3/uL (ref 4.0–10.5)
nRBC: 0.3 % — ABNORMAL HIGH (ref 0.0–0.2)

## 2020-06-09 LAB — COMPREHENSIVE METABOLIC PANEL
ALT: 30 U/L (ref 0–44)
AST: 24 U/L (ref 15–41)
Albumin: 2.7 g/dL — ABNORMAL LOW (ref 3.5–5.0)
Alkaline Phosphatase: 54 U/L (ref 38–126)
Anion gap: 9 (ref 5–15)
BUN: 33 mg/dL — ABNORMAL HIGH (ref 8–23)
CO2: 28 mmol/L (ref 22–32)
Calcium: 9.7 mg/dL (ref 8.9–10.3)
Chloride: 93 mmol/L — ABNORMAL LOW (ref 98–111)
Creatinine, Ser: 1.34 mg/dL — ABNORMAL HIGH (ref 0.61–1.24)
GFR, Estimated: 58 mL/min — ABNORMAL LOW (ref >60)
Glucose, Bld: 279 mg/dL — ABNORMAL HIGH (ref 70–99)
Potassium: 4 mmol/L (ref 3.5–5.1)
Sodium: 130 mmol/L — ABNORMAL LOW (ref 135–145)
Total Bilirubin: 0.5 mg/dL (ref 0.3–1.2)
Total Protein: 5.3 g/dL — ABNORMAL LOW (ref 6.5–8.1)

## 2020-06-09 LAB — MAGNESIUM: Magnesium: 1.4 mg/dL — ABNORMAL LOW (ref 1.7–2.4)

## 2020-06-09 LAB — TROPONIN I (HIGH SENSITIVITY)
Troponin I (High Sensitivity): 6 ng/L (ref <18)
Troponin I (High Sensitivity): 6 ng/L (ref <18)

## 2020-06-09 IMAGING — DX DG CHEST 2V
2 series · 2 of 2 positions shown · non-contrast
Comparison: No priors.

CLINICAL DATA: 69-year-old male with history of intermittent chest
pain.

EXAM:
CHEST - 2 VIEW

[chest lat]
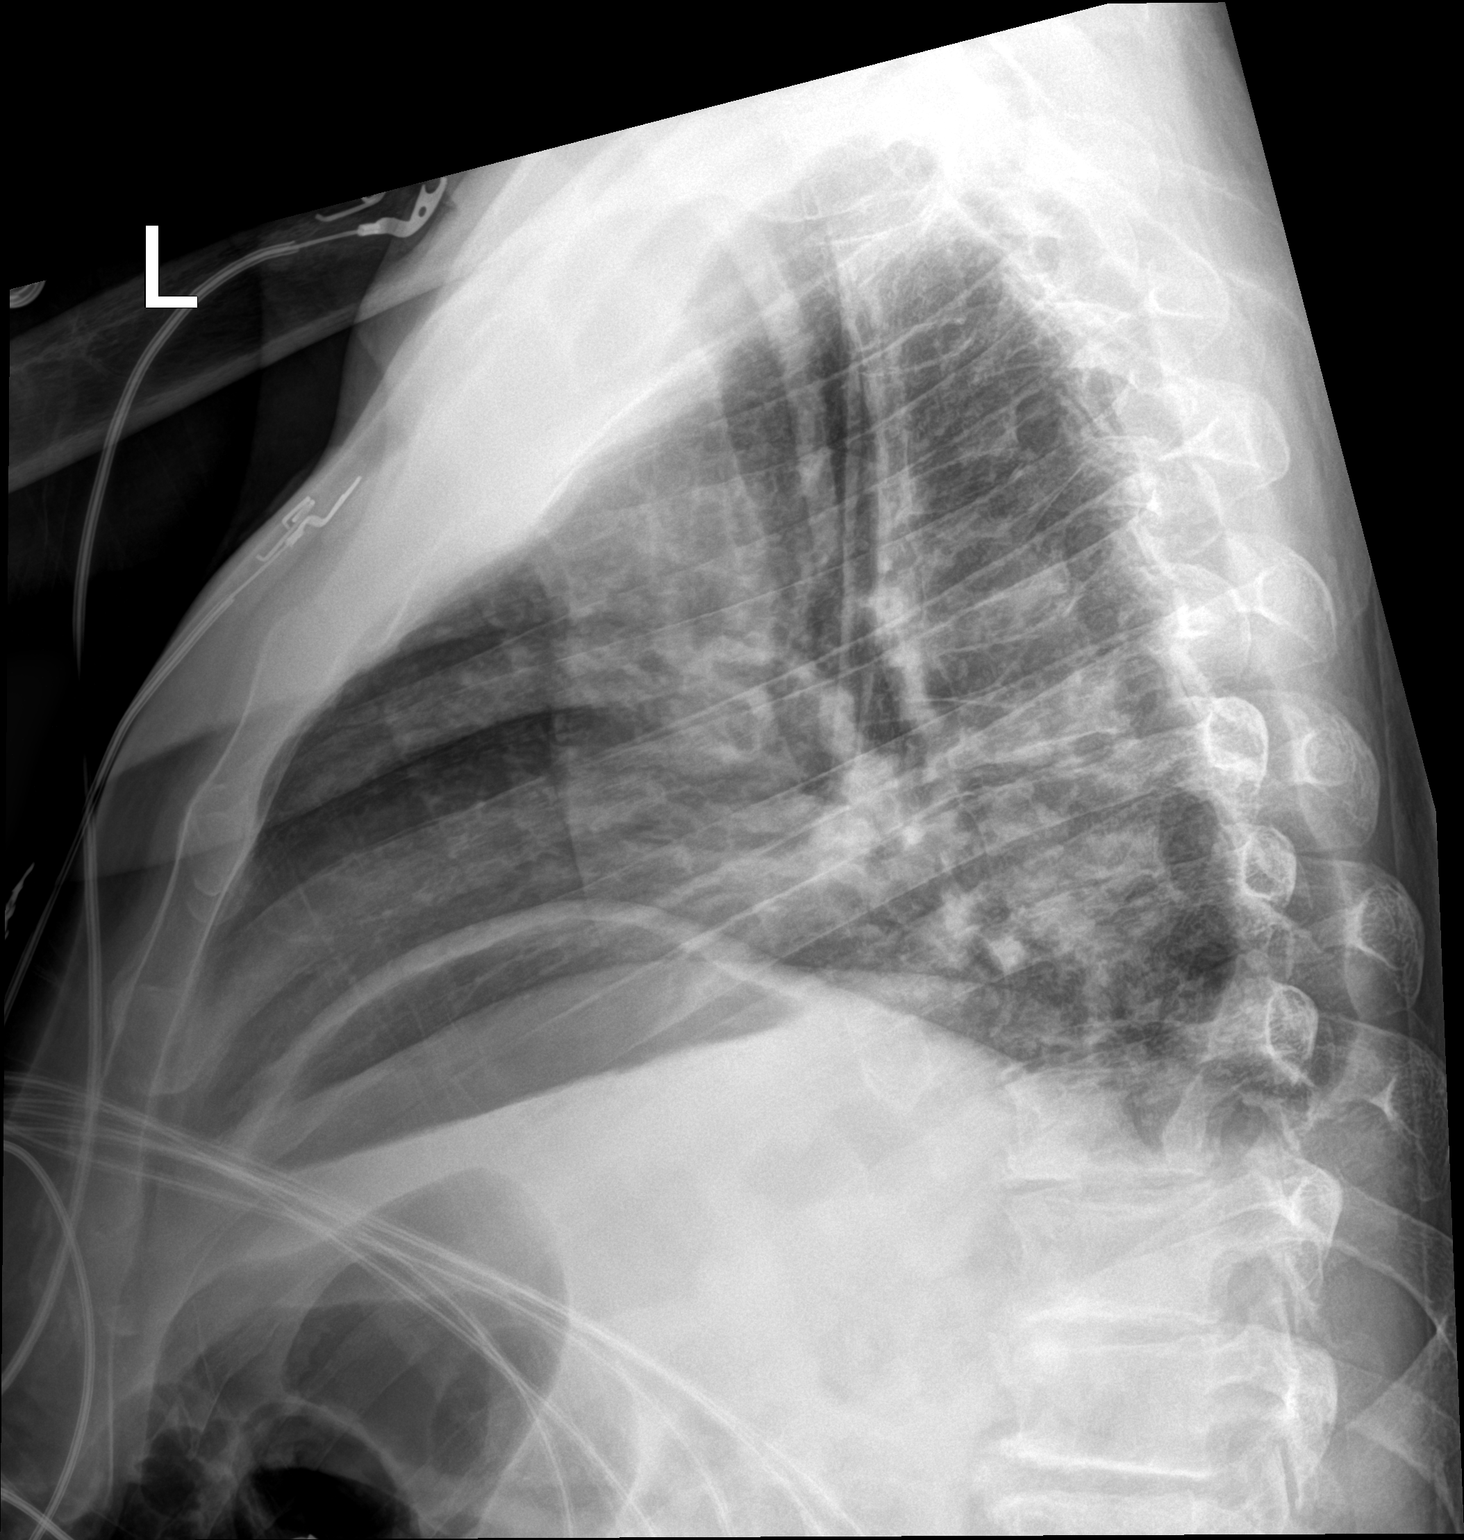

[chest ap]
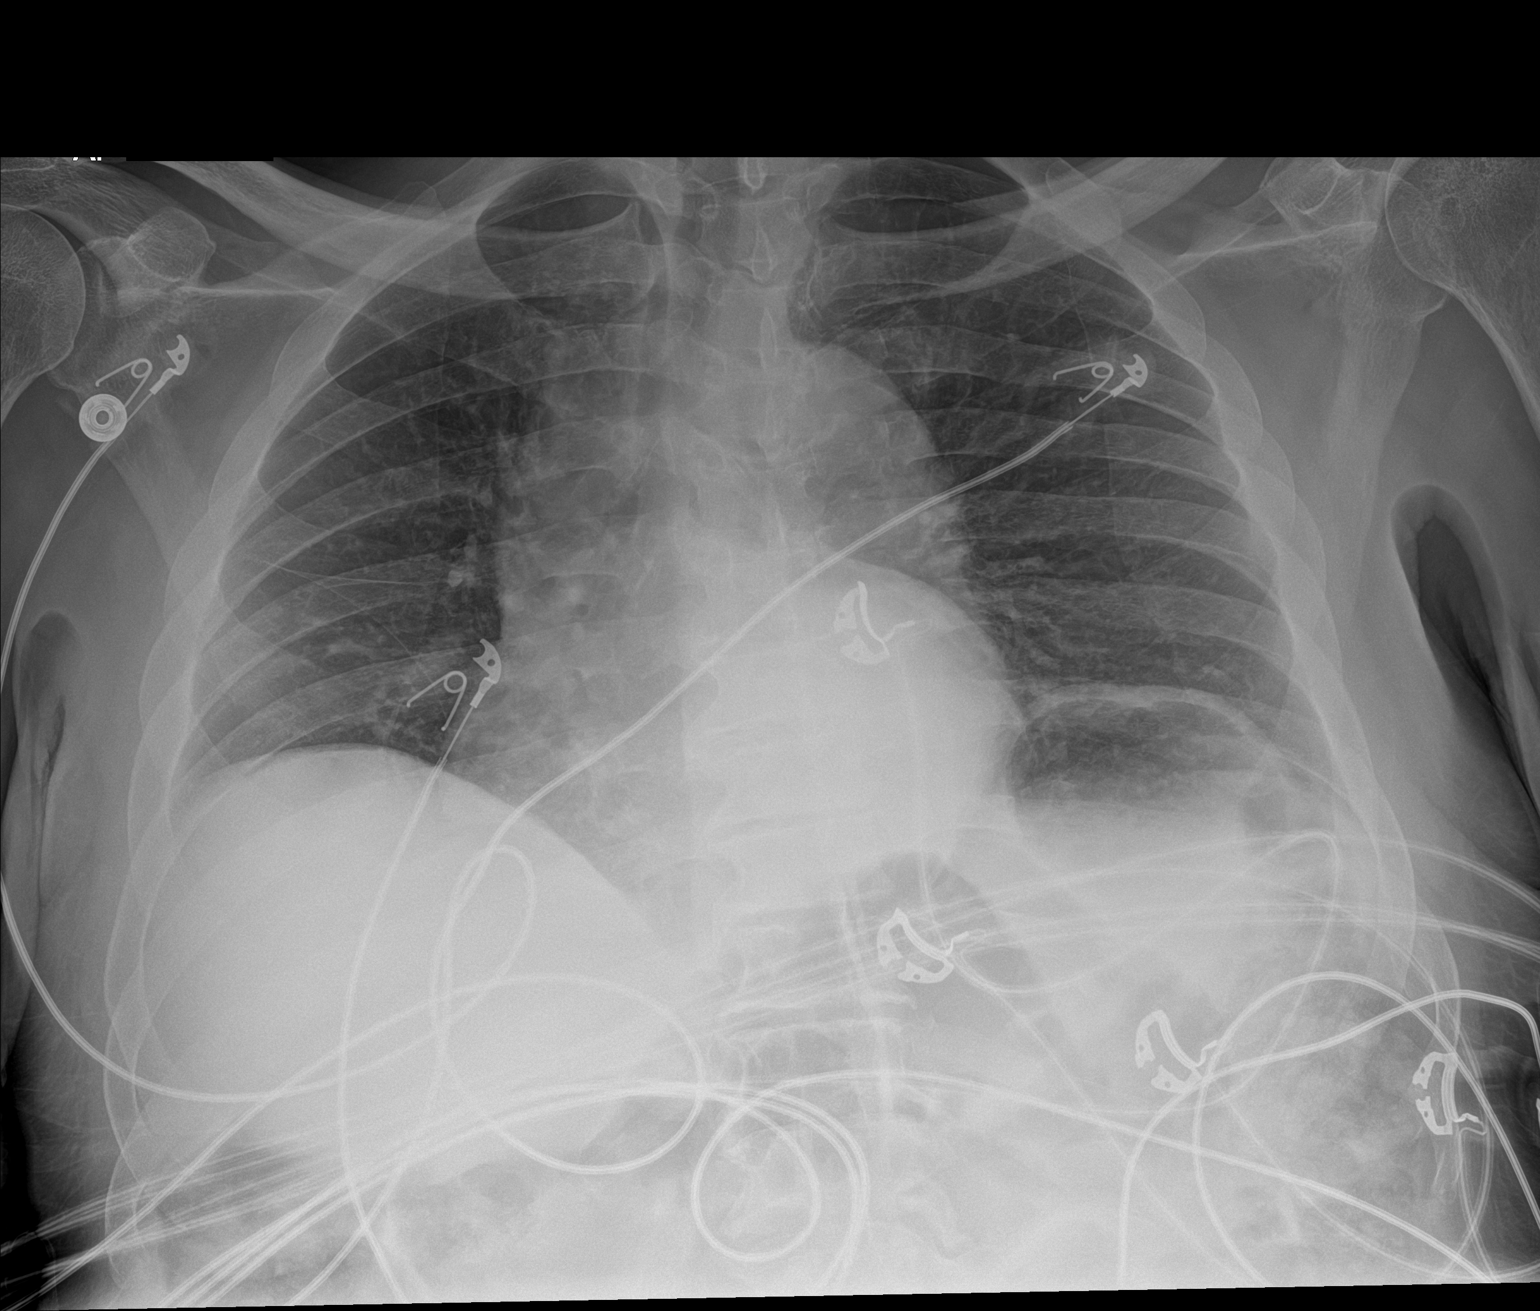

[2 of 2 positions shown; findings below may reference images not displayed]

FINDINGS: Lung volumes are low. No consolidative airspace disease. No pleural
effusions. No pneumothorax. No pulmonary nodule or mass noted.
Pulmonary vasculature is normal. The patient is rotated to the right
on today's exam, resulting in distortion of the mediastinal contours
and reduced diagnostic sensitivity and specificity for mediastinal
pathology.
IMPRESSION: 1. Low lung volumes without radiographic evidence of acute
cardiopulmonary disease.

## 2020-06-09 MED ORDER — MAGNESIUM SULFATE 2 GM/50ML IV SOLN
2.0000 g | Freq: Once | INTRAVENOUS | Status: AC
  Administered 2020-06-09: 2 g via INTRAVENOUS
  Filled 2020-06-09: qty 50

## 2020-06-09 NOTE — Discharge Instructions (Signed)
Your work-up today was reassuring.  Your magnesium was slightly low but not as low during her hospital admission.  We have repleted here in the emergency department.  Please follow-up with your primary care doctor.  Return to the emergency department for any chest pain, difficulty breathing, abdominal pain or any other worsening concerning symptoms.

## 2020-06-09 NOTE — ED Triage Notes (Signed)
Per EMS, pt evaluated yesterday for hyperglycemia. Pt cbg continue to be elevated. This am cbg 336 with EMS. Pt also reports intermittent chest pain. Pt given x2 aspirin prior to EMS arrival.

## 2020-06-09 NOTE — ED Provider Notes (Signed)
Daniel [name] Provider Note   CSN: [id number] Arrival date & time: 06/09/20  1326     History Chief Complaint  Patient presents with  . Chest Pain    Daniel [name] is a 70 y.o. male has no history of dementia, hypertension, diabetes who presents for evaluation of chest pain.  Patient reports that "it hurts right there" (while pointing to his chest).  I talked to his legal guardian Daniel [name]).  Patient has a history of dementia and is at his mental baseline.  She reports he was just admitted for hyperglycemia and was found to have very low potassium, magnesium.  She states that he takes his diabetes medications as directed.  She does not know much more information about the chest pain.  He denies any abdominal pain, nausea/vomiting, difficulty breathing.  EM LEVEL 5 CAVEAT DUE TO DEMENTIA    The history is provided by the patient and a caregiver.       Past Medical History:  Diagnosis Date  . Diabetes mellitus without complication ([place])   . High cholesterol   . Hypertension     Patient Active Problem List   Diagnosis Date Noted  . Hyperglycemia   . History of stroke   . Hypokalemia   . Hypomagnesemia   . AKI (acute kidney injury) ([place]) 06/06/2020  . Acute CVA (cerebrovascular accident) ([place]) 01/05/2020  . Right knee pain 01/01/2017  . Osteoarthritis of right knee 01/01/2017  . Mental impairment 09/23/2016  . Hypertension 09/23/2016  . Type 2 diabetes mellitus without complication, without long-term current use of insulin ([place]) 09/23/2016  . BPH (benign prostatic hyperplasia) 09/23/2016  . Dementia ([place]) 09/23/2016  . 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 HIP SURGERY     left  . KIDNEY SURGERY         Family History  Family history unknown: Yes    Social History   Tobacco Use  . Smoking status: Never Smoker  . Smokeless tobacco: Never Used  Vaping Use  . Vaping Use:  Never used  Substance Use Topics  . Alcohol use: No  . Drug use: No    Home Medications Prior to Admission medications   Medication Sig Start Date End Date Taking? Authorizing Provider  acetaminophen (TYLENOL) 650 MG CR tablet Take 650 mg by mouth 2 (two) times daily.   Yes [provider]  amitriptyline (ELAVIL) 10 MG tablet Take 10 mg by mouth at bedtime. 04/30/20  Yes [provider]  amLODipine (NORVASC) 10 MG tablet Take 1 tablet (10 mg total) by mouth daily. 06/08/20  Yes Barton Dubois, MD  aspirin EC 325 MG tablet Take 1 tablet (325 mg total) by mouth daily. 02/21/20  Yes Idol, Almyra Free, PA-C  atorvastatin (LIPITOR) 40 MG tablet Take 1 tablet (40 mg total) by mouth daily. 01/07/20 02/06/20 Yes Pahwani, Einar Grad, MD  Cholecalciferol (VITAMIN D3) 2000 units capsule TAKE 1 CAPSULE BY MOUTH ONCE DAILY. **DO NOT CRUSH** Patient taking differently: Take 2,000 Units by mouth daily. 01/06/18  Yes Fayrene Helper, MD  clopidogrel (PLAVIX) 75 MG tablet Take 75 mg by mouth daily.   Yes [provider]  donepezil (ARICEPT) 10 MG tablet TAKE (1) TABLET BY MOUTH ONCE DAILY. Patient taking differently: Take 10 mg by mouth at bedtime. 06/15/17  Yes Raylene Everts, MD  FEROSUL 325 (65 Fe) MG tablet TAKE 1 TABLET BY MOUTH TWICE DAILY. Patient taking  differently: Take 325 mg by mouth in the morning and at bedtime. 06/15/17  Yes Eustace MooreNelson, Yvonne Sue, MD  FORA LANCETS MISC by Does not apply route. Test once daily   Yes [provider]  gentian violet 1 % topical solution Apply 1 mL topically See admin instructions. Applied between toes daily   Yes [provider]  glucose blood test strip 1 each by Other route daily. Use as instructed   Yes [provider]  magnesium oxide (MAG-OX) 400 MG tablet Take 1 tablet (400 mg total) by mouth daily. 06/08/20  Yes Vassie LollMadera, Carlos, MD  memantine (NAMENDA) 10 MG tablet Take 10 mg by mouth 2 (two) times daily. 06/07/20  Yes  [provider]  metFORMIN (GLUCOPHAGE) 1000 MG tablet Take 1 tablet (1,000 mg total) by mouth 2 (two) times daily with a meal. 06/08/20  Yes Vassie LollMadera, Carlos, MD  metoprolol tartrate (LOPRESSOR) 25 MG tablet Take 25 mg by mouth 2 (two) times daily. 05/09/20  Yes [provider]  oxybutynin (DITROPAN-XL) 5 MG 24 hr tablet Take 5 mg by mouth daily. 01/04/20  Yes [provider]  potassium chloride (KLOR-CON) 10 MEQ tablet Take 2 tablets (20 mEq total) by mouth daily. 06/08/20  Yes Vassie LollMadera, Carlos, MD  ramipril (ALTACE) 10 MG capsule Take 2 capsules (20 mg total) by mouth daily. 06/08/20  Yes Vassie LollMadera, Carlos, MD  Semaglutide,0.25 or 0.5MG /DOS, (OZEMPIC, 0.25 OR 0.5 MG/DOSE,) 2 MG/1.5ML SOPN Inject 0.5 mg into the skin once a week. 06/08/20 09/06/20 Yes Vassie LollMadera, Carlos, MD  tamsulosin (FLOMAX) 0.4 MG CAPS capsule TAKE 1 CAPSULE BY MOUTH AT BEDTIME. Patient taking differently: Take 0.4 mg by mouth at bedtime. 06/15/17  Yes Eustace MooreNelson, Yvonne Sue, MD  traZODone (DESYREL) 100 MG tablet Take 100 mg by mouth at bedtime. 01/04/20  Yes [provider]    Allergies    Patient has no known allergies.  Review of Systems   Review of Systems  Unable to perform ROS: Dementia    Physical Exam Updated Vital Signs BP 131/80   Pulse 100   Temp (!) 97.5 F (36.4 C) (Oral)   Resp (!) 22   Ht 5\' 11"  (1.803 m)   Wt 73.9 kg   SpO2 92%   BMI 22.73 kg/m   Physical Exam Vitals and nursing note reviewed.  Constitutional:      Appearance: Normal appearance. He is well-developed and well-nourished.  HENT:     Head: Normocephalic and atraumatic.     Mouth/Throat:     Mouth: Oropharynx is clear and moist and mucous membranes are normal.  Eyes:     General: Lids are normal. No scleral icterus.       Right eye: No discharge.        Left eye: No discharge.     Extraocular Movements: EOM normal.     Conjunctiva/sclera: Conjunctivae normal.     Pupils: Pupils are equal, round, and  reactive to light.  Cardiovascular:     Rate and Rhythm: Normal rate and regular rhythm.     Pulses: Normal pulses.     Heart sounds: Normal heart sounds. No murmur heard. No friction rub. No gallop.   Pulmonary:     Effort: Pulmonary effort is normal.     Breath sounds: Normal breath sounds.     Comments: Lungs clear to auscultation bilaterally.  Symmetric chest rise.  No wheezing, rales, rhonchi. Able to speak in full sentences without any difficulty breathing.  Chest:  Comments: Point tenderness and midsternal chest area.  No deformity or crepitus noted. Abdominal:     Palpations: Abdomen is soft. Abdomen is not rigid.     Tenderness: There is no abdominal tenderness. There is no guarding.     Comments: Abdomen is soft, non-distended, non-tender. No rigidity, No guarding. No peritoneal signs.  Musculoskeletal:        General: Normal range of motion.     Cervical back: Full passive range of motion without pain.  Skin:    General: Skin is warm and dry.     Capillary Refill: Capillary refill takes less than 2 seconds.  Neurological:     Mental Status: He is alert.  Psychiatric:        Mood and Affect: Mood and affect normal.        Speech: Speech normal.        Behavior: Behavior normal.     ED Results / Procedures / Treatments   Labs (all labs ordered are listed, but only abnormal results are displayed) Labs Reviewed  COMPREHENSIVE METABOLIC PANEL - Abnormal; Notable for the following components:      Result Value   Sodium 130 (*)    Chloride 93 (*)    Glucose, Bld 279 (*)    BUN 33 (*)    Creatinine, Ser 1.34 (*)    Total Protein 5.3 (*)    Albumin 2.7 (*)    GFR, Estimated 58 (*)    All other components within normal limits  CBC WITH DIFFERENTIAL/PLATELET - Abnormal; Notable for the following components:   RBC 3.91 (*)    Hemoglobin 11.2 (*)    HCT 34.1 (*)    nRBC 0.3 (*)    All other components within normal limits  MAGNESIUM - Abnormal; Notable for the  following components:   Magnesium 1.4 (*)    All other components within normal limits  TROPONIN I (HIGH SENSITIVITY)  TROPONIN I (HIGH SENSITIVITY)    EKG EKG Interpretation  Date/Time:  Saturday June 09 2020 13:49:06 EST Ventricular Rate:  89 PR Interval:    QRS Duration: 88 QT Interval:  349 QTC Calculation: 425 R Axis:   60 Text Interpretation: Sinus rhythm Atrial premature complexes in couplets Low voltage with right axis deviation Confirmed by Kennis Carina [id number]) on 06/09/2020 3:37:51 PM   Radiology DG Chest 2 View  Result Date: 06/09/2020 CLINICAL DATA:  70 year old male with history of intermittent chest pain. EXAM: CHEST - 2 VIEW COMPARISON:  No priors. FINDINGS: Lung volumes are low. No consolidative airspace disease. No pleural effusions. No pneumothorax. No pulmonary nodule or mass noted. Pulmonary vasculature is normal. The patient is rotated to the right on today's exam, resulting in distortion of the mediastinal contours and reduced diagnostic sensitivity and specificity for mediastinal pathology. IMPRESSION: 1. Low lung volumes without radiographic evidence of acute cardiopulmonary disease. Electronically Signed   By: Trudie Reed M.D.   On: 06/09/2020 14:51    Procedures Procedures (including critical care time)  Medications Ordered in ED Medications  magnesium sulfate IVPB 2 g 50 mL (0 g Intravenous Stopped 06/09/20 1850)    ED Course  I have reviewed the triage vital signs and the nursing notes.  Pertinent labs & imaging results that were available during my care of the patient were reviewed by me and considered in my medical decision making (see chart for details).    MDM Rules/Calculators/A&P  70 year old male past med history of dementia, hypertension tension, diabetes who presents for evaluation of chest pain.  History very limited secondary to patient's dementia.  I talked with patient's legal guardian.  Patient just had  an admission for hypomagnesia, hyperglycemia, hypokalemia.  She states that patient is at his normal mental baseline.  We will plan to check labs, chest x-ray, EKG.  We will add on magnesium.  CBC shows no leukocytosis.  Hemoglobin stable 11.2.  Chest x-ray unremarkable for any acute abnormality. Mag is 1.4. CMP shows 130, Glucose is 279. BUN is 33, Cr 1.34.  This is actually improvement from his previous BUN and creatinine function.  Trop is negative.    Repeat troponin is negative.  Given his low magnesium, his magnesium levels were repleted.  Reevaluation.  Patient resting comfortably.  He states he feels somewhat better.  No signs of distress.  I discussed with [name], his legal guardian.  I updated her on plan.  She is agreeable to plan for discharge home. At this time, patient exhibits no emergent life-threatening condition that require further evaluation in ED. Patient had ample opportunity for questions and discussion. All patient's questions were answered with full understanding. Strict return precautions discussed. Patient expresses understanding and agreement to plan.   Portions of this note were generated with [employer]. Dictation errors may occur despite best attempts at proofreading.    Final Clinical Impression(s) / ED Diagnoses Final diagnoses:  Nonspecific chest pain  Hypomagnesemia    Rx / DC Orders ED Discharge Orders    None       Volanda Napoleon, PA-C 06/09/20 2313    Maudie Flakes, MD 06/09/20 2354

## 2020-06-09 NOTE — ED Notes (Signed)
See triage notes. Nad. Color wnl. nondiaphoretic

## 2020-06-09 NOTE — ED Notes (Signed)
Pt unable to sign discharge, instructions reviewed with [facility] home care giver.

## 2020-06-16 ENCOUNTER — Other Ambulatory Visit: Payer: Self-pay

## 2020-06-16 ENCOUNTER — Encounter (HOSPITAL_COMMUNITY): Payer: Self-pay

## 2020-06-16 ENCOUNTER — Emergency Department (HOSPITAL_COMMUNITY)
Admission: EM | Admit: 2020-06-16 | Discharge: 2020-06-16 | Disposition: A | Discharge status: Home / Self Care | Payer: Medicare Other | Attending: Emergency Medicine | Admitting: Emergency Medicine

## 2020-06-16 ENCOUNTER — Emergency Department (HOSPITAL_COMMUNITY): Payer: Medicare Other

## 2020-06-16 DIAGNOSIS — E871 Hypo-osmolality and hyponatremia: Secondary | ICD-10-CM | POA: Diagnosis not present

## 2020-06-16 DIAGNOSIS — Z7982 Long term (current) use of aspirin: Secondary | ICD-10-CM | POA: Insufficient documentation

## 2020-06-16 DIAGNOSIS — I959 Hypotension, unspecified: Secondary | ICD-10-CM

## 2020-06-16 DIAGNOSIS — E119 Type 2 diabetes mellitus without complications: Secondary | ICD-10-CM | POA: Diagnosis not present

## 2020-06-16 DIAGNOSIS — Z7984 Long term (current) use of oral hypoglycemic drugs: Secondary | ICD-10-CM | POA: Insufficient documentation

## 2020-06-16 DIAGNOSIS — F0390 Unspecified dementia without behavioral disturbance: Secondary | ICD-10-CM | POA: Diagnosis not present

## 2020-06-16 DIAGNOSIS — Z79899 Other long term (current) drug therapy: Secondary | ICD-10-CM | POA: Insufficient documentation

## 2020-06-16 DIAGNOSIS — I10 Essential (primary) hypertension: Secondary | ICD-10-CM | POA: Insufficient documentation

## 2020-06-16 LAB — CBC
HCT: 37.2 % — ABNORMAL LOW (ref 39.0–52.0)
Hemoglobin: 12.1 g/dL — ABNORMAL LOW (ref 13.0–17.0)
MCH: 28.6 pg (ref 26.0–34.0)
MCHC: 32.5 g/dL (ref 30.0–36.0)
MCV: 87.9 fL (ref 80.0–100.0)
Platelets: 375 10*3/uL (ref 150–400)
RBC: 4.23 MIL/uL (ref 4.22–5.81)
RDW: 14.4 % (ref 11.5–15.5)
WBC: 13.2 10*3/uL — ABNORMAL HIGH (ref 4.0–10.5)
nRBC: 0 % (ref 0.0–0.2)

## 2020-06-16 LAB — COMPREHENSIVE METABOLIC PANEL
ALT: 31 U/L (ref 0–44)
AST: 12 U/L — ABNORMAL LOW (ref 15–41)
Albumin: 2.4 g/dL — ABNORMAL LOW (ref 3.5–5.0)
Alkaline Phosphatase: 52 U/L (ref 38–126)
Anion gap: 12 (ref 5–15)
BUN: 36 mg/dL — ABNORMAL HIGH (ref 8–23)
CO2: 24 mmol/L (ref 22–32)
Calcium: 9.2 mg/dL (ref 8.9–10.3)
Chloride: 91 mmol/L — ABNORMAL LOW (ref 98–111)
Creatinine, Ser: 1.45 mg/dL — ABNORMAL HIGH (ref 0.61–1.24)
GFR, Estimated: 53 mL/min — ABNORMAL LOW (ref >60)
Glucose, Bld: 269 mg/dL — ABNORMAL HIGH (ref 70–99)
Potassium: 4.8 mmol/L (ref 3.5–5.1)
Sodium: 127 mmol/L — ABNORMAL LOW (ref 135–145)
Total Bilirubin: 0.9 mg/dL (ref 0.3–1.2)
Total Protein: 4.9 g/dL — ABNORMAL LOW (ref 6.5–8.1)

## 2020-06-16 LAB — MAGNESIUM: Magnesium: 0.9 mg/dL — CL (ref 1.7–2.4)

## 2020-06-16 LAB — TROPONIN I (HIGH SENSITIVITY): Troponin I (High Sensitivity): 6 ng/L (ref <18)

## 2020-06-16 IMAGING — DX DG CHEST 1V PORT
1 series · 1 of 1 positions shown · non-contrast
Comparison: [DATE]

CLINICAL DATA: 69-year-old male with hypotension.

EXAM:
PORTABLE CHEST 1 VIEW

[chest ap]
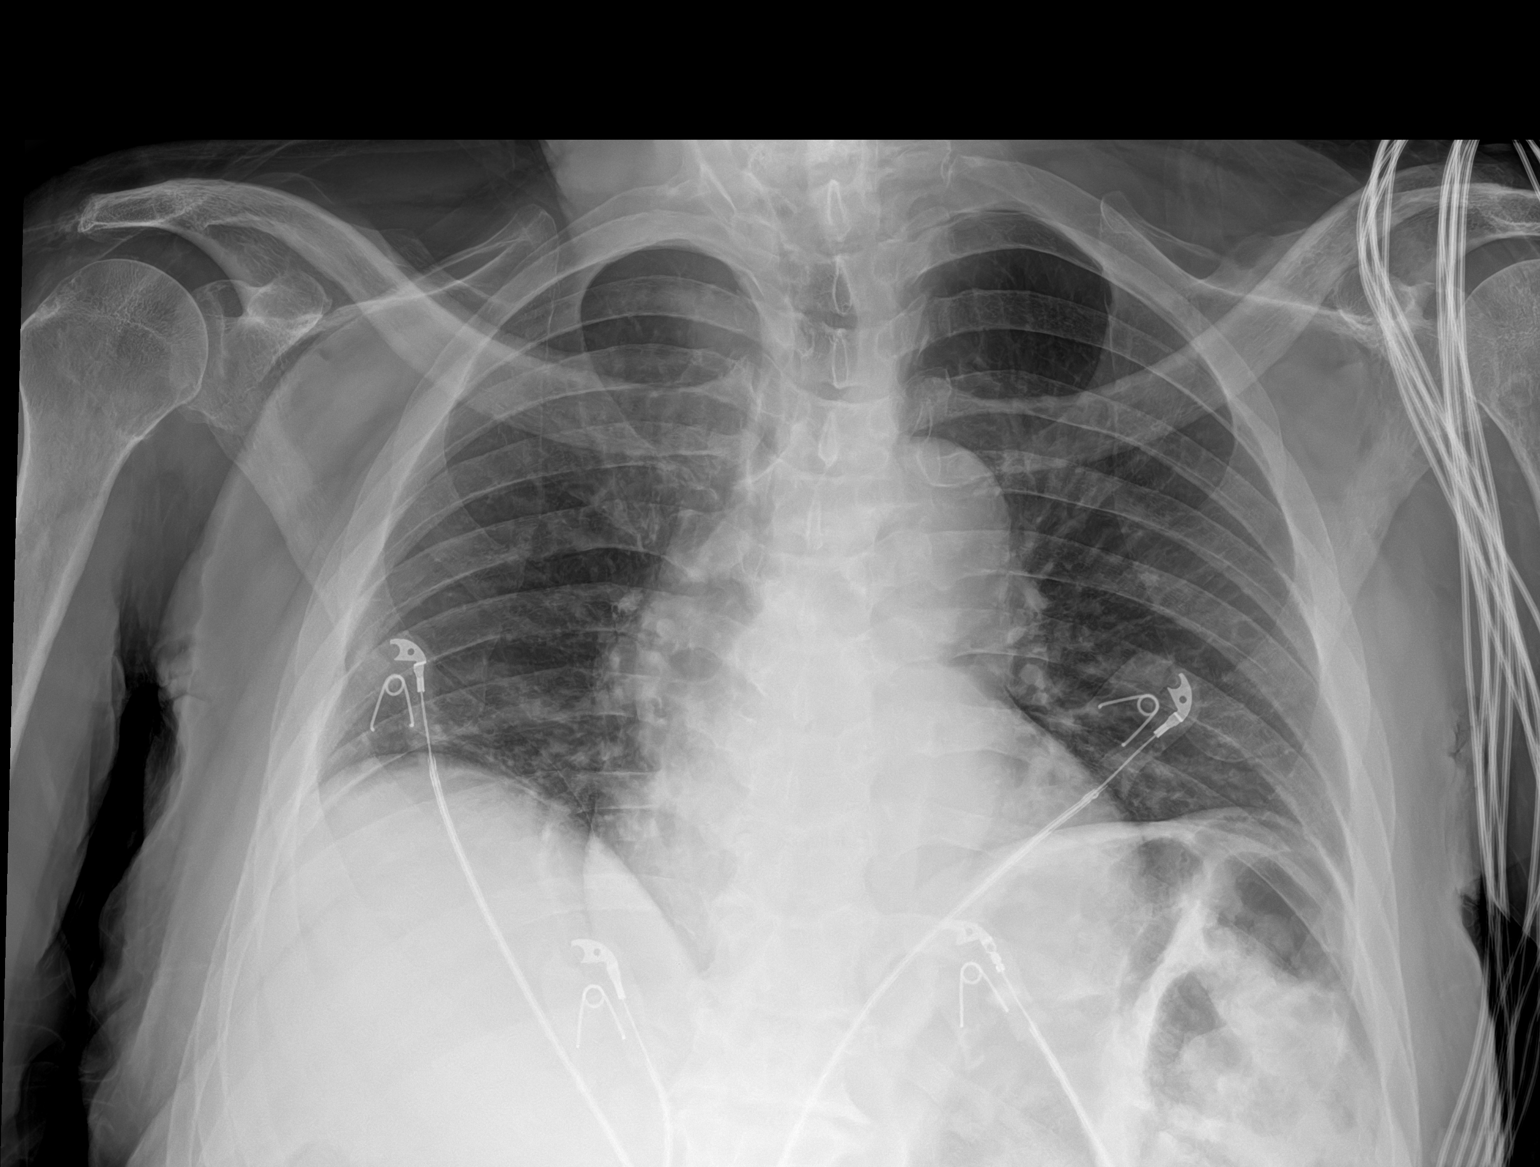

[1 of 1 positions shown; findings below may reference images not displayed]

FINDINGS: The heart size and mediastinal contours are within normal limits.
Similar appearing low lung volumes. Mild bibasilar subsegmental
atelectasis. The visualized skeletal structures are unremarkable.
IMPRESSION: Similar appearing low lung volumes with mild bibasilar subsegmental
atelectasis.

## 2020-06-16 MED ORDER — SODIUM CHLORIDE 0.9 % IV BOLUS
500.0000 mL | Freq: Once | INTRAVENOUS | Status: AC
  Administered 2020-06-16: 500 mL via INTRAVENOUS

## 2020-06-16 MED ORDER — AMLODIPINE BESYLATE 5 MG PO TABS: 5.0000 mg | ORAL_TABLET | Freq: Every day | ORAL | 0 refills | Status: DC

## 2020-06-16 MED ORDER — MAGNESIUM OXIDE 400 240 MG PO PACK: 1.0000 | PACK | Freq: Every day | ORAL | 0 refills | Status: DC

## 2020-06-16 MED ORDER — SODIUM CHLORIDE 0.9 % IV BOLUS
1000.0000 mL | Freq: Once | INTRAVENOUS | Status: AC
  Administered 2020-06-16: 1000 mL via INTRAVENOUS

## 2020-06-16 MED ORDER — MAGNESIUM SULFATE 2 GM/50ML IV SOLN
2.0000 g | Freq: Once | INTRAVENOUS | Status: AC
  Administered 2020-06-16: 2 g via INTRAVENOUS
  Filled 2020-06-16: qty 50

## 2020-06-16 NOTE — ED Triage Notes (Signed)
Pt brought to ED via [facility] for hypotension. Pt released from hospital 5 days ago. Pt from [place]. BP 110/70, facility wants evaluated.

## 2020-06-16 NOTE — ED Notes (Signed)
CRITICAL VALUE ALERT  Critical Value:  Magnesium 0.9  Date & Time Notied:  06/16/2020 1325  Provider Notified: Dr. Gilford Raid  Orders Received/Actions taken: see chart

## 2020-06-16 NOTE — Discharge Instructions (Addendum)
Stop metoprolol.  Stop Altace.  Decrease Amlodipine down to 5 mg daily.  You will need to follow up with your doctor in 1 week to recheck labs.

## 2020-06-16 NOTE — ED Provider Notes (Signed)
[facility] EMERGENCY DEPARTMENT Provider Note   CSN: [id number] Arrival date & time: 06/16/20  1111     History Chief Complaint  Patient presents with  . Hypotension    Daniel [name] is a 70 y.o. male.  Pt presents to the ED today with hypotension.  Pt said he feels fine.  Pt was admitted from 12/28-31 for hyperglycemia and AKI.  He came back on 1/1 for cp.  He denies any pain now.  His hctz and clonidine were d/c'd at d/c.  His MAR shows they have not been giving him those meds.  No f/c.  No n/v/d.  BP 93 at the facility this am.  Here it is 110/70.        Past Medical History:  Diagnosis Date  . Diabetes mellitus without complication ([facility])   . High cholesterol   . Hypertension     Patient Active Problem List   Diagnosis Date Noted  . Hyperglycemia   . History of stroke   . Hypokalemia   . Hypomagnesemia   . AKI (acute kidney injury) ([facility]) 06/06/2020  . Acute CVA (cerebrovascular accident) ([facility]) 01/05/2020  . Right knee pain 01/01/2017  . Osteoarthritis of right knee 01/01/2017  . Mental impairment 09/23/2016  . Hypertension 09/23/2016  . Type 2 diabetes mellitus without complication, without long-term current use of insulin ([facility]) 09/23/2016  . BPH (benign prostatic hyperplasia) 09/23/2016  . Dementia ([facility]) 09/23/2016  . 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 HIP SURGERY     left  . KIDNEY SURGERY         Family History  Family history unknown: Yes    Social History   Tobacco Use  . Smoking status: Never Smoker  . Smokeless tobacco: Never Used  Vaping Use  . Vaping Use: Never used  Substance Use Topics  . Alcohol use: No  . Drug use: No    Home Medications Prior to Admission medications   Medication Sig Start Date End Date Taking? Authorizing Provider  Magnesium Oxide (MAGNESIUM OXIDE 400) 240 MG PACK Take 1 capsule by mouth daily. 06/16/20  Yes Jacalyn Lefevre, MD  acetaminophen  (TYLENOL) 650 MG CR tablet Take 650 mg by mouth 2 (two) times daily.    [provider]  amitriptyline (ELAVIL) 10 MG tablet Take 10 mg by mouth at bedtime. 04/30/20   [provider]  amLODipine (NORVASC) 5 MG tablet Take 1 tablet (5 mg total) by mouth daily. 06/16/20   Jacalyn Lefevre, MD  aspirin EC 325 MG tablet Take 1 tablet (325 mg total) by mouth daily. 02/21/20   Burgess Amor, PA-C  atorvastatin (LIPITOR) 40 MG tablet Take 1 tablet (40 mg total) by mouth daily. 01/07/20 02/06/20  Hughie Closs, MD  Cholecalciferol (VITAMIN D3) 2000 units capsule TAKE 1 CAPSULE BY MOUTH ONCE DAILY. **DO NOT CRUSH** Patient taking differently: Take 2,000 Units by mouth daily. 01/06/18   Kerri Perches, MD  clopidogrel (PLAVIX) 75 MG tablet Take 75 mg by mouth daily.    [provider]  donepezil (ARICEPT) 10 MG tablet TAKE (1) TABLET BY MOUTH ONCE DAILY. Patient taking differently: Take 10 mg by mouth at bedtime. 06/15/17   Eustace Vasseur, MD  FEROSUL 325 (65 Fe) MG tablet TAKE 1 TABLET BY MOUTH TWICE DAILY. Patient taking differently: Take 325 mg by mouth in the morning and at bedtime. 06/15/17   Eustace Brienza, MD  FORA LANCETS MISC by Does not apply route. Test once daily    [provider]  gentian violet 1 % topical solution Apply 1 mL topically See admin instructions. Applied between toes daily    [provider]  glucose blood test strip 1 each by Other route daily. Use as instructed    [provider]  magnesium oxide (MAG-OX) 400 MG tablet Take 1 tablet (400 mg total) by mouth daily. 06/08/20   Barton Dubois, MD  memantine (NAMENDA) 10 MG tablet Take 10 mg by mouth 2 (two) times daily. 06/07/20   [provider]  metFORMIN (GLUCOPHAGE) 1000 MG tablet Take 1 tablet (1,000 mg total) by mouth 2 (two) times daily with a meal. 06/08/20   Barton Dubois, MD  oxybutynin (DITROPAN-XL) 5 MG 24 hr tablet Take 5 mg by mouth daily. 01/04/20    [provider]  potassium chloride (KLOR-CON) 10 MEQ tablet Take 2 tablets (20 mEq total) by mouth daily. 06/08/20   Barton Dubois, MD  Semaglutide,0.25 or 0.5MG /DOS, (OZEMPIC, 0.25 OR 0.5 MG/DOSE,) 2 MG/1.5ML SOPN Inject 0.5 mg into the skin once a week. 06/08/20 09/06/20  Barton Dubois, MD  tamsulosin (FLOMAX) 0.4 MG CAPS capsule TAKE 1 CAPSULE BY MOUTH AT BEDTIME. Patient taking differently: Take 0.4 mg by mouth at bedtime. 06/15/17   Raylene Everts, MD  traZODone (DESYREL) 100 MG tablet Take 100 mg by mouth at bedtime. 01/04/20   [provider]  metoprolol tartrate (LOPRESSOR) 25 MG tablet Take 25 mg by mouth 2 (two) times daily. 05/09/20 06/16/20  [provider]  ramipril (ALTACE) 10 MG capsule Take 2 capsules (20 mg total) by mouth daily. 06/08/20 06/16/20  Barton Dubois, MD    Allergies    Patient has no known allergies.  Review of Systems   Review of Systems  All other systems reviewed and are negative.   Physical Exam Updated Vital Signs BP 97/69   Pulse 85   Temp 98.4 F (36.9 C) (Oral)   Resp 15   Ht 5\' 11"  (1.803 m)   Wt 73.5 kg   SpO2 96%   BMI 22.59 kg/m   Physical Exam Vitals and nursing note reviewed.  Constitutional:      Appearance: Normal appearance.  HENT:     Head: Normocephalic and atraumatic.     Right Ear: External ear normal.     Left Ear: External ear normal.     Nose: Nose normal.     Mouth/Throat:     Mouth: Mucous membranes are moist.     Pharynx: Oropharynx is clear.  Eyes:     Extraocular Movements: Extraocular movements intact.     Conjunctiva/sclera: Conjunctivae normal.     Pupils: Pupils are equal, round, and reactive to light.  Cardiovascular:     Rate and Rhythm: Normal rate and regular rhythm.     Pulses: Normal pulses.     Heart sounds: Normal heart sounds.  Pulmonary:     Effort: Pulmonary effort is normal.     Breath sounds: Normal breath sounds.  Abdominal:     General: Abdomen is flat. Bowel  sounds are normal.     Palpations: Abdomen is soft.  Musculoskeletal:        General: Normal range of motion.     Cervical back: Normal range of motion and neck supple.  Skin:    General: Skin is warm.     Capillary Refill: Capillary refill takes less than 2 seconds.  Neurological:  General: No focal deficit present.     Mental Status: He is alert. Mental status is at baseline.  Psychiatric:        Mood and Affect: Mood normal.        Behavior: Behavior normal.        Thought Content: Thought content normal.        Judgment: Judgment normal.     ED Results / Procedures / Treatments   Labs (all labs ordered are listed, but only abnormal results are displayed) Labs Reviewed  CBC - Abnormal; Notable for the following components:      Result Value   WBC 13.2 (*)    Hemoglobin 12.1 (*)    HCT 37.2 (*)    All other components within normal limits  COMPREHENSIVE METABOLIC PANEL - Abnormal; Notable for the following components:   Sodium 127 (*)    Chloride 91 (*)    Glucose, Bld 269 (*)    BUN 36 (*)    Creatinine, Ser 1.45 (*)    Total Protein 4.9 (*)    Albumin 2.4 (*)    AST 12 (*)    GFR, Estimated 53 (*)    All other components within normal limits  MAGNESIUM - Abnormal; Notable for the following components:   Magnesium 0.9 (*)    All other components within normal limits  TROPONIN I (HIGH SENSITIVITY)  TROPONIN I (HIGH SENSITIVITY)    EKG EKG Interpretation  Date/Time:  Saturday June 16 2020 11:23:58 EST Ventricular Rate:  81 PR Interval:    QRS Duration: 93 QT Interval:  369 QTC Calculation: 415 R Axis:   -2 Text Interpretation: Sinus rhythm Atrial premature complexes Inferior infarct, old Consider anterior infarct No significant change since last tracing Confirmed by Isla Pence [phone]) on 06/16/2020 12:10:34 PM   Radiology DG Chest Port 1 View  Result Date: 06/16/2020 CLINICAL DATA:  70 year old male with hypotension. EXAM: PORTABLE CHEST 1 VIEW  COMPARISON:  06/09/2020 FINDINGS: The heart size and mediastinal contours are within normal limits. Similar appearing low lung volumes. Mild bibasilar subsegmental atelectasis. The visualized skeletal structures are unremarkable. IMPRESSION: Similar appearing low lung volumes with mild bibasilar subsegmental atelectasis. Electronically Signed   By: Ruthann Cancer MD   On: 06/16/2020 11:41    Procedures Procedures (including critical care time)  Medications Ordered in ED Medications  magnesium sulfate IVPB 2 g 50 mL (0 g Intravenous Stopped 06/16/20 1456)  sodium chloride 0.9 % bolus 500 mL (0 mLs Intravenous Stopped 06/16/20 1451)  sodium chloride 0.9 % bolus 1,000 mL (1,000 mLs Intravenous New Bag/Given 06/16/20 1455)    ED Course  I have reviewed the triage vital signs and the nursing notes.  Pertinent labs & imaging results that were available during my care of the patient were reviewed by me and considered in my medical decision making (see chart for details).    MDM Rules/Calculators/A&P                          BP is slightly low.  Pt given IVFs. Pt has no pain.  No signs of sepsis.  He is in no distress.  He may just be on too much for his blood pressure.    Lopressor and Altace is to be stopped.  Amlodipine decreased to 5 mg from 10 mg.  Mg is low.  This is replaced with 2 g IV and he is given oral magnesium for home.  Final  Clinical Impression(s) / ED Diagnoses Final diagnoses:  Hypomagnesemia  Hypotension, unspecified hypotension type  Hyponatremia    Rx / DC Orders ED Discharge Orders         Ordered    Magnesium Oxide (MAGNESIUM OXIDE 400) 240 MG PACK  Daily        06/16/20 1340    amLODipine (NORVASC) 5 MG tablet  Daily        06/16/20 1340           Isla Pence, MD 06/16/20 1500

## 2020-06-19 ENCOUNTER — Emergency Department (HOSPITAL_COMMUNITY): Payer: Medicare Other

## 2020-06-19 ENCOUNTER — Encounter (HOSPITAL_COMMUNITY): Payer: Self-pay | Admitting: Emergency Medicine

## 2020-06-19 ENCOUNTER — Inpatient Hospital Stay (HOSPITAL_COMMUNITY)
Admission: EM | Admit: 2020-06-19 | Discharge: 2020-06-26 | DRG: 378 | Disposition: A | Discharge status: Skilled Nursing Facility | Payer: Medicare Other | Attending: Internal Medicine | Admitting: Internal Medicine

## 2020-06-19 ENCOUNTER — Other Ambulatory Visit: Payer: Self-pay

## 2020-06-19 DIAGNOSIS — D519 Vitamin B12 deficiency anemia, unspecified: Secondary | ICD-10-CM | POA: Diagnosis present

## 2020-06-19 DIAGNOSIS — Z7984 Long term (current) use of oral hypoglycemic drugs: Secondary | ICD-10-CM

## 2020-06-19 DIAGNOSIS — K2970 Gastritis, unspecified, without bleeding: Secondary | ICD-10-CM | POA: Diagnosis present

## 2020-06-19 DIAGNOSIS — R000 Tachycardia, unspecified: Secondary | ICD-10-CM

## 2020-06-19 DIAGNOSIS — K254 Chronic or unspecified gastric ulcer with hemorrhage: Secondary | ICD-10-CM | POA: Diagnosis not present

## 2020-06-19 DIAGNOSIS — R109 Unspecified abdominal pain: Secondary | ICD-10-CM | POA: Diagnosis present

## 2020-06-19 DIAGNOSIS — K5900 Constipation, unspecified: Secondary | ICD-10-CM

## 2020-06-19 DIAGNOSIS — E876 Hypokalemia: Secondary | ICD-10-CM | POA: Diagnosis not present

## 2020-06-19 DIAGNOSIS — Q453 Other congenital malformations of pancreas and pancreatic duct: Secondary | ICD-10-CM

## 2020-06-19 DIAGNOSIS — E7800 Pure hypercholesterolemia, unspecified: Secondary | ICD-10-CM | POA: Diagnosis present

## 2020-06-19 DIAGNOSIS — R1011 Right upper quadrant pain: Secondary | ICD-10-CM

## 2020-06-19 DIAGNOSIS — Z79899 Other long term (current) drug therapy: Secondary | ICD-10-CM

## 2020-06-19 DIAGNOSIS — E785 Hyperlipidemia, unspecified: Secondary | ICD-10-CM | POA: Diagnosis present

## 2020-06-19 DIAGNOSIS — R933 Abnormal findings on diagnostic imaging of other parts of digestive tract: Secondary | ICD-10-CM

## 2020-06-19 DIAGNOSIS — E1165 Type 2 diabetes mellitus with hyperglycemia: Secondary | ICD-10-CM | POA: Diagnosis present

## 2020-06-19 DIAGNOSIS — Z6822 Body mass index (BMI) 22.0-22.9, adult: Secondary | ICD-10-CM

## 2020-06-19 DIAGNOSIS — Z20822 Contact with and (suspected) exposure to covid-19: Secondary | ICD-10-CM | POA: Diagnosis present

## 2020-06-19 DIAGNOSIS — J9811 Atelectasis: Secondary | ICD-10-CM | POA: Diagnosis present

## 2020-06-19 DIAGNOSIS — E872 Acidosis: Secondary | ICD-10-CM | POA: Diagnosis present

## 2020-06-19 DIAGNOSIS — D649 Anemia, unspecified: Secondary | ICD-10-CM

## 2020-06-19 DIAGNOSIS — D638 Anemia in other chronic diseases classified elsewhere: Secondary | ICD-10-CM | POA: Diagnosis present

## 2020-06-19 DIAGNOSIS — I10 Essential (primary) hypertension: Secondary | ICD-10-CM | POA: Diagnosis present

## 2020-06-19 DIAGNOSIS — Z8673 Personal history of transient ischemic attack (TIA), and cerebral infarction without residual deficits: Secondary | ICD-10-CM

## 2020-06-19 DIAGNOSIS — K760 Fatty (change of) liver, not elsewhere classified: Secondary | ICD-10-CM | POA: Diagnosis present

## 2020-06-19 DIAGNOSIS — K8689 Other specified diseases of pancreas: Secondary | ICD-10-CM | POA: Diagnosis present

## 2020-06-19 DIAGNOSIS — E871 Hypo-osmolality and hyponatremia: Secondary | ICD-10-CM | POA: Diagnosis not present

## 2020-06-19 DIAGNOSIS — Z7902 Long term (current) use of antithrombotics/antiplatelets: Secondary | ICD-10-CM

## 2020-06-19 DIAGNOSIS — Z7982 Long term (current) use of aspirin: Secondary | ICD-10-CM

## 2020-06-19 DIAGNOSIS — R1010 Upper abdominal pain, unspecified: Secondary | ICD-10-CM

## 2020-06-19 DIAGNOSIS — K319 Disease of stomach and duodenum, unspecified: Secondary | ICD-10-CM | POA: Diagnosis present

## 2020-06-19 DIAGNOSIS — N179 Acute kidney failure, unspecified: Secondary | ICD-10-CM | POA: Diagnosis present

## 2020-06-19 DIAGNOSIS — R7989 Other specified abnormal findings of blood chemistry: Secondary | ICD-10-CM

## 2020-06-19 DIAGNOSIS — K2100 Gastro-esophageal reflux disease with esophagitis, without bleeding: Secondary | ICD-10-CM | POA: Diagnosis present

## 2020-06-19 DIAGNOSIS — E440 Moderate protein-calorie malnutrition: Secondary | ICD-10-CM | POA: Diagnosis present

## 2020-06-19 LAB — COMPREHENSIVE METABOLIC PANEL
ALT: 22 U/L (ref 0–44)
AST: 13 U/L — ABNORMAL LOW (ref 15–41)
Albumin: 2.5 g/dL — ABNORMAL LOW (ref 3.5–5.0)
Alkaline Phosphatase: 53 U/L (ref 38–126)
Anion gap: 11 (ref 5–15)
BUN: 37 mg/dL — ABNORMAL HIGH (ref 8–23)
CO2: 24 mmol/L (ref 22–32)
Calcium: 10 mg/dL (ref 8.9–10.3)
Chloride: 93 mmol/L — ABNORMAL LOW (ref 98–111)
Creatinine, Ser: 1.62 mg/dL — ABNORMAL HIGH (ref 0.61–1.24)
GFR, Estimated: 46 mL/min — ABNORMAL LOW (ref >60)
Glucose, Bld: 409 mg/dL — ABNORMAL HIGH (ref 70–99)
Potassium: 5 mmol/L (ref 3.5–5.1)
Sodium: 128 mmol/L — ABNORMAL LOW (ref 135–145)
Total Bilirubin: 0.5 mg/dL (ref 0.3–1.2)
Total Protein: 5.1 g/dL — ABNORMAL LOW (ref 6.5–8.1)

## 2020-06-19 LAB — CBC
HCT: 37.7 % — ABNORMAL LOW (ref 39.0–52.0)
Hemoglobin: 12.2 g/dL — ABNORMAL LOW (ref 13.0–17.0)
MCH: 29 pg (ref 26.0–34.0)
MCHC: 32.4 g/dL (ref 30.0–36.0)
MCV: 89.8 fL (ref 80.0–100.0)
Platelets: 406 10*3/uL — ABNORMAL HIGH (ref 150–400)
RBC: 4.2 MIL/uL — ABNORMAL LOW (ref 4.22–5.81)
RDW: 15 % (ref 11.5–15.5)
WBC: 13.3 10*3/uL — ABNORMAL HIGH (ref 4.0–10.5)
nRBC: 0 % (ref 0.0–0.2)

## 2020-06-19 LAB — TROPONIN I (HIGH SENSITIVITY)
Troponin I (High Sensitivity): 8 ng/L (ref <18)
Troponin I (High Sensitivity): 8 ng/L (ref <18)

## 2020-06-19 LAB — LACTIC ACID, PLASMA
Lactic Acid, Venous: 2.5 mmol/L (ref 0.5–1.9)
Lactic Acid, Venous: 2.4 mmol/L (ref 0.5–1.9)

## 2020-06-19 LAB — LIPASE, BLOOD: Lipase: 32 U/L (ref 11–51)

## 2020-06-19 LAB — MAGNESIUM: Magnesium: 1.2 mg/dL — ABNORMAL LOW (ref 1.7–2.4)

## 2020-06-19 IMAGING — DX DG CHEST 1V PORT
1 series · 1 of 1 positions shown · non-contrast
Comparison: [DATE]

CLINICAL DATA: Chest pain

EXAM:
PORTABLE CHEST 1 VIEW

[chest ap]
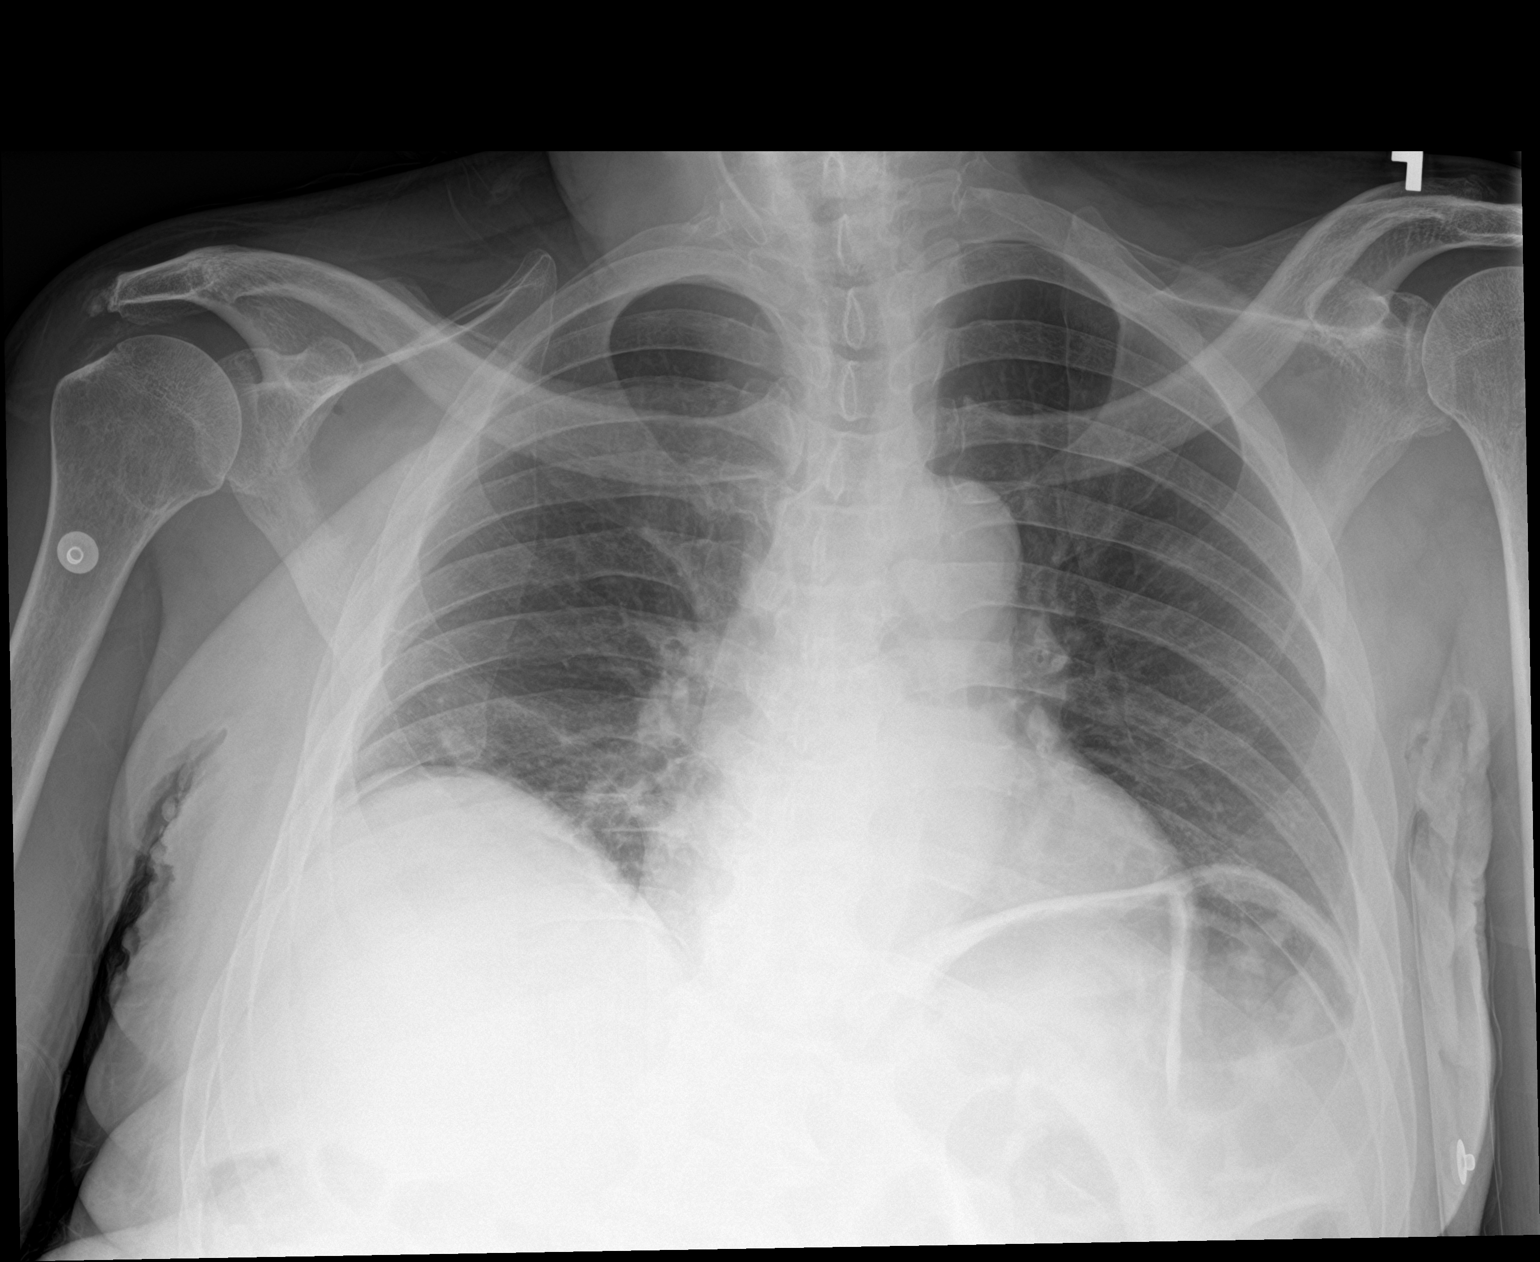

[1 of 1 positions shown; findings below may reference images not displayed]

FINDINGS: Low lung volumes with minimal atelectasis at the right base. No
consolidation or effusion. Stable cardiomediastinal silhouette. No
pneumothorax
IMPRESSION: Low lung volumes with minimal right basilar atelectasis.

## 2020-06-19 IMAGING — CT CT ABD-PELV W/ CM
2 of 5 series · 15 of 46 positions shown, 17 images · IV contrast (Omnipaque or Isovue)
Comparison: None.

CLINICAL DATA: Epigastric pain.

EXAM:
CT ABDOMEN AND PELVIS WITH CONTRAST
TECHNIQUE: Multidetector CT imaging of the abdomen and pelvis was performed
using the standard protocol following bolus administration of
intravenous contrast.
CONTRAST:  100mL OMNIPAQUE IOHEXOL 300 MG/ML  SOLN

[Series 2: axial st · axial · 0.68mm/px · z∈[+830,+1205]mm · 12 of 86 slices shown, 14 images]
[im 6/86  soft-tissue]
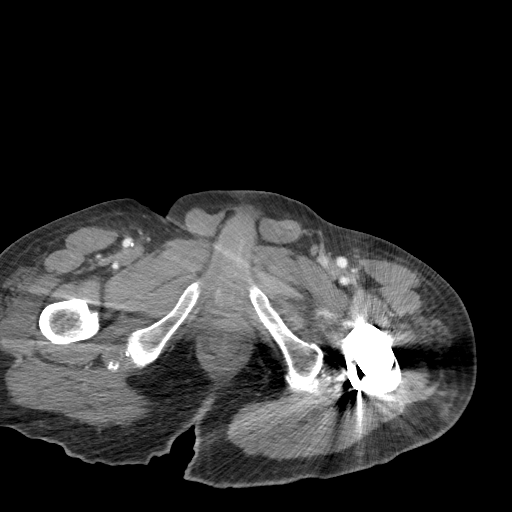
[im 6/86  bone]
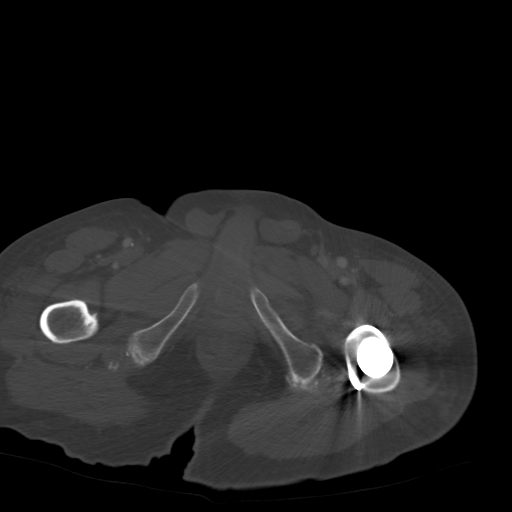
[im 16/86  soft-tissue]
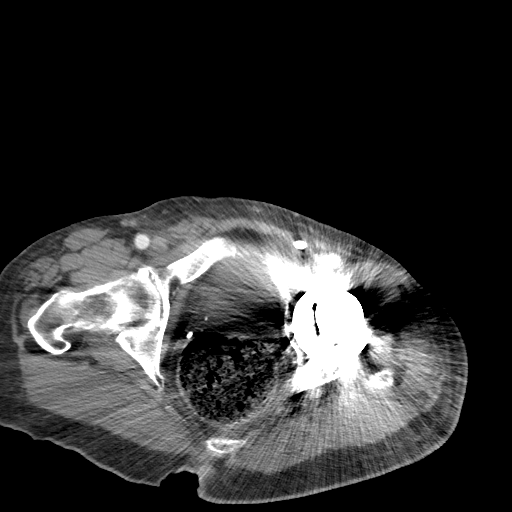
[im 21/86  soft-tissue]
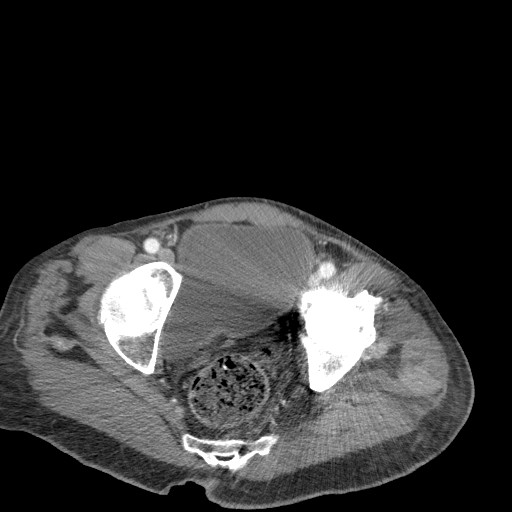
[im 26/86  soft-tissue]
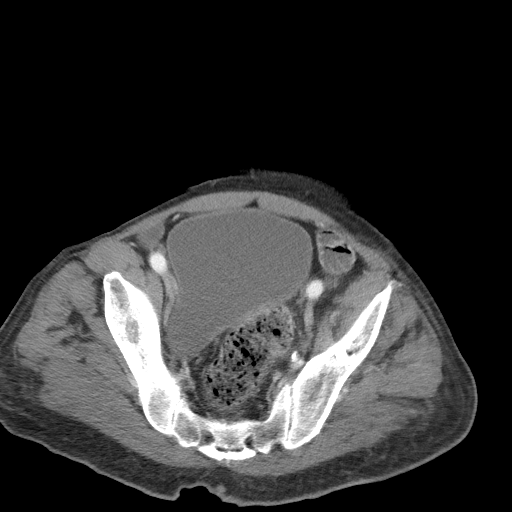
[im 36/86  soft-tissue]
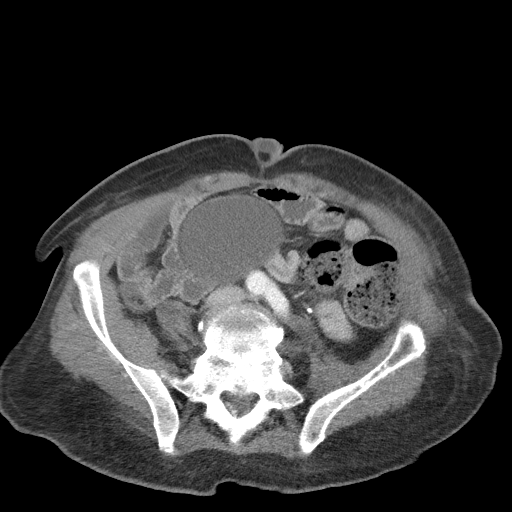
[im 41/86  soft-tissue]
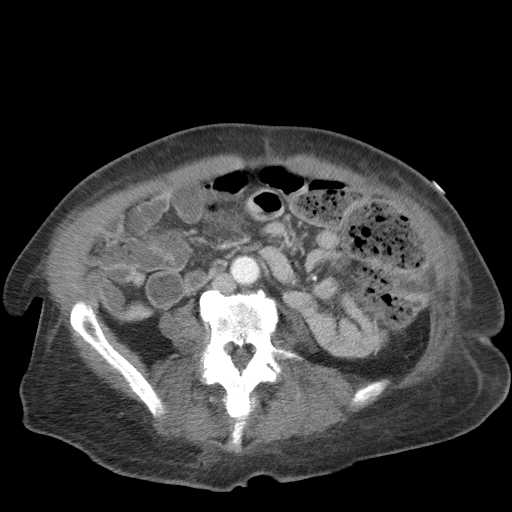
[im 46/86  soft-tissue]
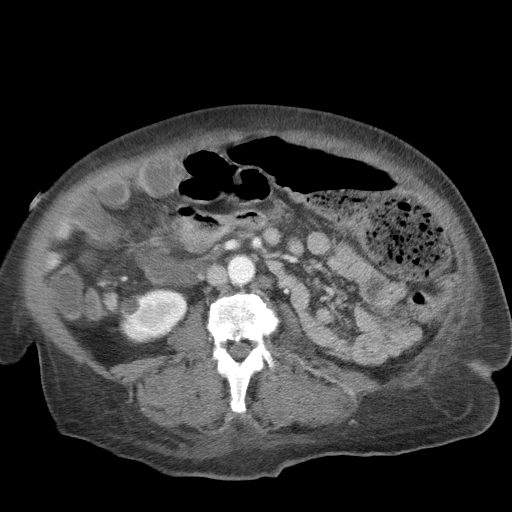
[im 56/86  soft-tissue]
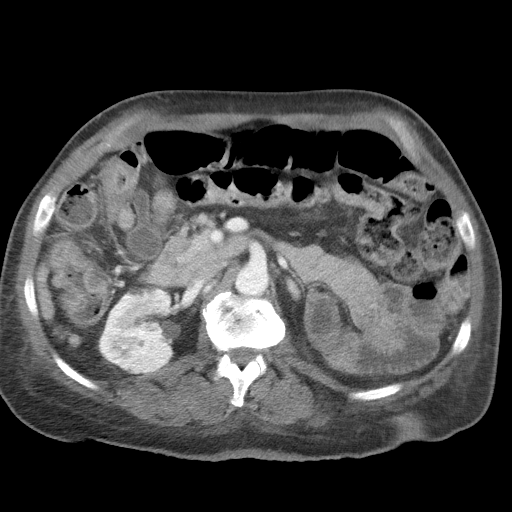
[im 61/86  soft-tissue]
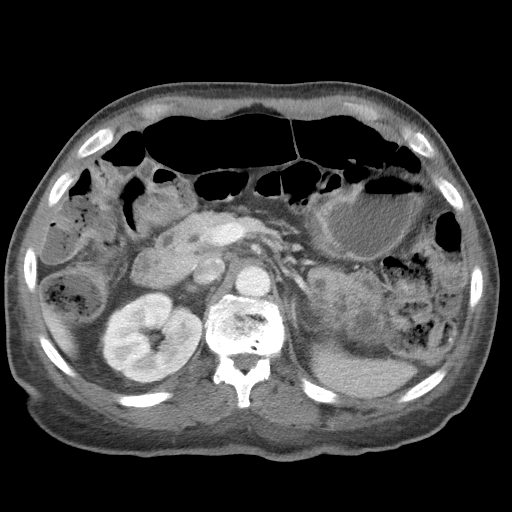
[im 61/86  bone]
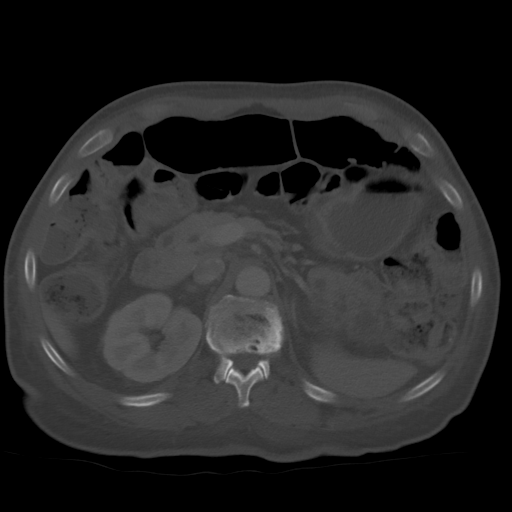
[im 66/86  soft-tissue]
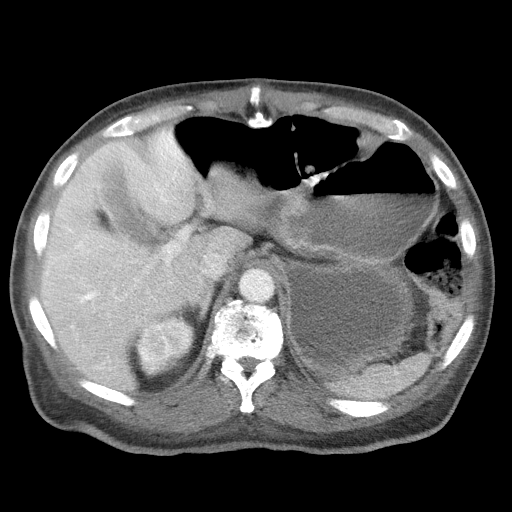
[im 76/86  soft-tissue]
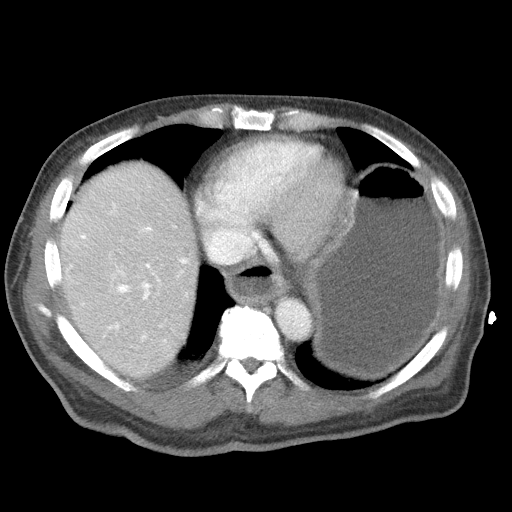
[im 81/86  soft-tissue]
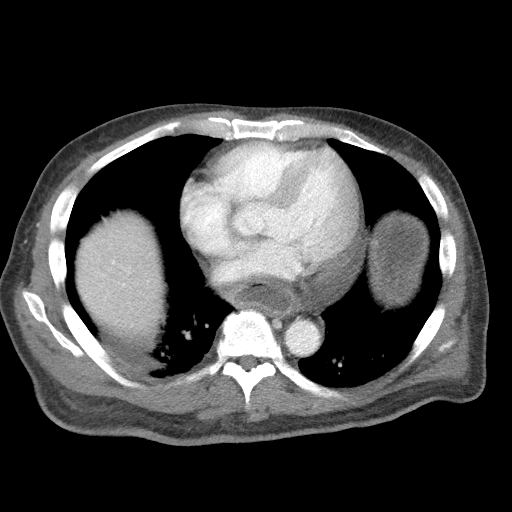

[Series 5: coronal st · coronal · 0.75mm/px · 3 of 85 slices shown]
[im 29/85  soft-tissue]
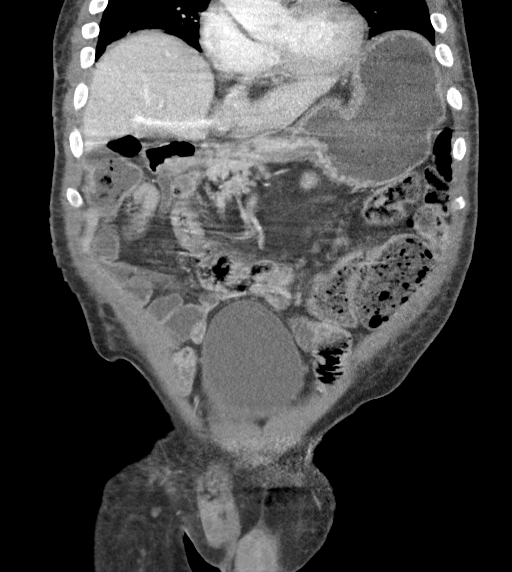
[im 38/85  soft-tissue]
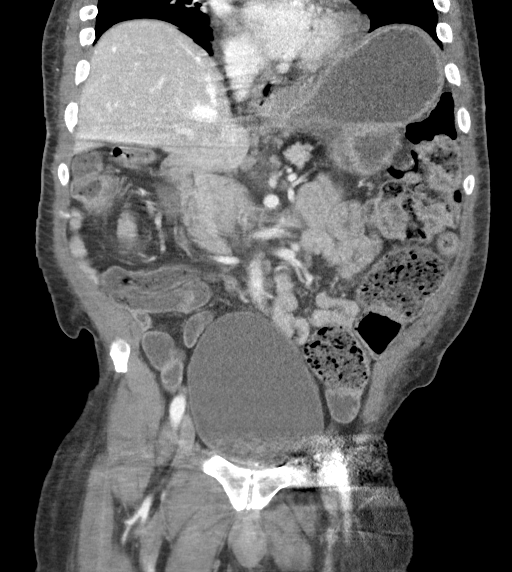
[im 47/85  soft-tissue]
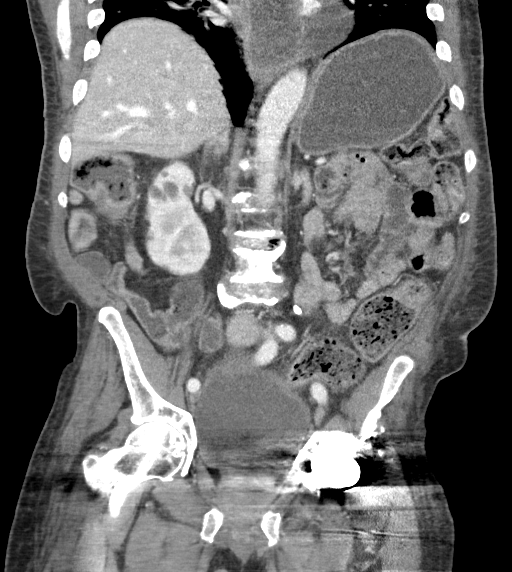

[15 of 46 positions shown; findings below may reference images not displayed]

FINDINGS: Lower chest: Right basilar opacity slightly greater than typically
seen with atelectasis. Small right pleural effusion. Distal
esophagus is dilated, fluid-filled, with diffuse wall thickening.

Hepatobiliary: Motion artifact through the liver and gallbladder.
Allowing for this, no focal liver abnormality is seen. Mild
diffusely decreased hepatic density typical of steatosis. No
abnormal gallbladder distention or calcified gallstone. There is no
biliary dilatation.

Pancreas: Pancreas appears slightly truncated. Slight prominence of
the proximal pancreatic duct at 3 mm. No peripancreatic fat
stranding.

Spleen: Normal in size without focal abnormality.

Adrenals/Urinary Tract: No adrenal nodule. No normal left is seen.
There may be a diminutive markedly atrophic kidney seen in the left
renal fossa, series 2, image 30. There is no right hydronephrosis.
Multiple cysts as well as low-density lesions that are too small to
characterize throughout the right renal parenchyma. There is no
right perinephric edema. No visualized renal calculi. Mild bladder
distension without wall thickening.

Stomach/Bowel: Patulous distal esophagus with wall thickening and
intraluminal fluid. There is mild wall thickening at the
gastroesophageal junction. Stomach is distended with intraluminal
fluid. Equivocal pre pyloric gastric wall thickening. Normal
positioning of the duodenum and ligament of Treitz. There is no
small bowel obstruction or inflammatory change. High-riding cecum in
the right upper quadrant. The appendix is tentatively visualized,
series 2, image 33, likely normal but partially obscured by motion.
No periappendiceal fat stranding. Colonic tortuosity with moderate
volume of colonic stool. Moderate to large stool primarily in the
left colon with stool distending the rectum, rectal distention of 7
cm. No colonic or rectal wall thickening.

Vascular/Lymphatic: Tortuous abdominal aorta with mild
atherosclerosis. Patent portal vein. No acute vascular findings. No
enlarged lymph nodes in the abdomen or pelvis.

Reproductive: Prostate grossly normal, streak artifact from left hip
arthroplasty obscures assessment.

Other: No ascites or free air. Small fat containing umbilical
hernia.

Musculoskeletal: Bilateral L5 pars interarticularis defects with 14
mm anterolisthesis of L5 on S1. Complete L5-S1 disc space loss with
associated facet hypertrophy. There is additional mild multilevel
degenerative change in the spine. Left hip arthroplasty. Right hip
osteoarthritis.
IMPRESSION: 1. Patulous distal esophagus with wall thickening and intraluminal
fluid, suggesting reflux or esophagitis.
2. Fluid-filled stomach with equivocal pre pyloric gastric wall
thickening, can be seen with gastritis or peptic ulcer disease.
3. Moderate to large volume of colonic stool with stool distending
the rectum, suggesting constipation.
4. Solitary right kidney with multiple cysts. There may be a
diminutive markedly atrophic left kidney, but this is questionable.
5. Mild hepatic steatosis.
6. Bilateral L5 pars interarticularis defects with 14 mm
anterolisthesis of L5 on S1.

Aortic Atherosclerosis ([QZ]-[QZ]).

## 2020-06-19 MED ORDER — MAGNESIUM SULFATE 2 GM/50ML IV SOLN
2.0000 g | Freq: Once | INTRAVENOUS | Status: AC
  Administered 2020-06-19: 2 g via INTRAVENOUS
  Filled 2020-06-19: qty 50

## 2020-06-19 MED ORDER — IOHEXOL 300 MG/ML  SOLN
100.0000 mL | Freq: Once | INTRAMUSCULAR | Status: AC | PRN
  Administered 2020-06-19: 100 mL via INTRAVENOUS

## 2020-06-19 MED ORDER — SODIUM CHLORIDE 0.9 % IV BOLUS
1000.0000 mL | Freq: Once | INTRAVENOUS | Status: AC
  Administered 2020-06-19: 1000 mL via INTRAVENOUS

## 2020-06-19 NOTE — ED Notes (Signed)
Patient transported to X-ray 

## 2020-06-19 NOTE — ED Notes (Signed)
Patient transported to CT 

## 2020-06-19 NOTE — ED Notes (Signed)
Date and time results received: 06/19/20 2136 (use smartphrase ".now" to insert current time)  Test: lactic acid Critical Value: 2.5  Name of Provider Notified: Dr Melina Copa  Orders Received? Or Actions Taken?: Actions Taken: no orders received3

## 2020-06-19 NOTE — ED Triage Notes (Signed)
Pt c/o epigastric pain. Pt has MR.

## 2020-06-19 NOTE — ED Provider Notes (Signed)
[facility] EMERGENCY DEPARTMENT Provider Note   CSN: [id number] Arrival date & time: 06/19/20  2011     History Chief Complaint  Patient presents with  . Abdominal Pain    Daniel [name] is a 70 y.o. male.  Patient is a history of mental impairment stroke diabetes.  He is presenting from his facility today with upper abdominal pain and chest pain.  He says it started last night.  It is sometimes associated with some shortness of breath.  He denies any vomiting or diarrhea.  No urinary symptoms.  No reported fever.  He has been here a few times recently with hypotension and AKI, and nonspecific chest pain.  The history is provided by the patient.  Abdominal Pain Pain location:  Epigastric Pain radiates to:  Chest Pain severity:  Unable to specify Onset quality:  Gradual Duration:  2 days Timing:  Unable to specify Progression:  Unchanged Chronicity:  New Context: not trauma   Relieved by:  None tried Worsened by:  Nothing Ineffective treatments:  None tried Associated symptoms: chest pain and shortness of breath   Associated symptoms: no constipation, no diarrhea, no dysuria, no fever, no nausea, no sore throat and no vomiting        Past Medical History:  Diagnosis Date  . Diabetes mellitus without complication ([place])   . High cholesterol   . Hypertension     Patient Active Problem List   Diagnosis Date Noted  . Hyperglycemia   . History of stroke   . Hypokalemia   . Hypomagnesemia   . AKI (acute kidney injury) ([place]) 06/06/2020  . Acute CVA (cerebrovascular accident) ([place]) 01/05/2020  . Right knee pain 01/01/2017  . Osteoarthritis of right knee 01/01/2017  . Mental impairment 09/23/2016  . Hypertension 09/23/2016  . Type 2 diabetes mellitus without complication, without long-term current use of insulin ([place]) 09/23/2016  . BPH (benign prostatic hyperplasia) 09/23/2016  . Dementia ([place]) 09/23/2016  . 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 HIP SURGERY     left  . KIDNEY SURGERY         Family History  Family history unknown: Yes    Social History   Tobacco Use  . Smoking status: Never Smoker  . Smokeless tobacco: Never Used  Vaping Use  . Vaping Use: Never used  Substance Use Topics  . Alcohol use: No  . Drug use: No    Home Medications Prior to Admission medications   Medication Sig Start Date End Date Taking? Authorizing Provider  acetaminophen (TYLENOL) 650 MG CR tablet Take 650 mg by mouth 2 (two) times daily.    [provider]  amitriptyline (ELAVIL) 10 MG tablet Take 10 mg by mouth at bedtime. 04/30/20   [provider]  amLODipine (NORVASC) 5 MG tablet Take 1 tablet (5 mg total) by mouth daily. 06/16/20   Isla Pence, MD  aspirin EC 325 MG tablet Take 1 tablet (325 mg total) by mouth daily. 02/21/20   Evalee Jefferson, PA-C  atorvastatin (LIPITOR) 40 MG tablet Take 1 tablet (40 mg total) by mouth daily. 01/07/20 02/06/20  Darliss Cheney, MD  Cholecalciferol (VITAMIN D3) 2000 units capsule TAKE 1 CAPSULE BY MOUTH ONCE DAILY. **DO NOT CRUSH** Patient taking differently: Take 2,000 Units by mouth daily. 01/06/18   Fayrene Helper, MD  clopidogrel (PLAVIX) 75 MG tablet Take 75 mg by mouth daily.    [provider]  donepezil (ARICEPT) 10 MG tablet TAKE (1) TABLET BY MOUTH ONCE DAILY. Patient taking differently: Take 10 mg by mouth at bedtime. 06/15/17   Raylene Everts, MD  FEROSUL 325 (65 Fe) MG tablet TAKE 1 TABLET BY MOUTH TWICE DAILY. Patient taking differently: Take 325 mg by mouth in the morning and at bedtime. 06/15/17   Raylene Everts, MD  FORA LANCETS MISC by Does not apply route. Test once daily    [provider]  gentian violet 1 % topical solution Apply 1 mL topically See admin instructions. Applied between toes daily    [provider]  glucose blood test strip 1 each by Other route daily. Use as  instructed    [provider]  magnesium oxide (MAG-OX) 400 MG tablet Take 1 tablet (400 mg total) by mouth daily. 06/08/20   Barton Dubois, MD  Magnesium Oxide (MAGNESIUM OXIDE 400) 240 MG PACK Take 1 capsule by mouth daily. 06/16/20   Isla Pence, MD  memantine (NAMENDA) 10 MG tablet Take 10 mg by mouth 2 (two) times daily. 06/07/20   [provider]  metFORMIN (GLUCOPHAGE) 1000 MG tablet Take 1 tablet (1,000 mg total) by mouth 2 (two) times daily with a meal. 06/08/20   Barton Dubois, MD  oxybutynin (DITROPAN-XL) 5 MG 24 hr tablet Take 5 mg by mouth daily. 01/04/20   [provider]  potassium chloride (KLOR-CON) 10 MEQ tablet Take 2 tablets (20 mEq total) by mouth daily. 06/08/20   Barton Dubois, MD  Semaglutide,0.25 or 0.5MG /DOS, (OZEMPIC, 0.25 OR 0.5 MG/DOSE,) 2 MG/1.5ML SOPN Inject 0.5 mg into the skin once a week. 06/08/20 09/06/20  Barton Dubois, MD  tamsulosin (FLOMAX) 0.4 MG CAPS capsule TAKE 1 CAPSULE BY MOUTH AT BEDTIME. Patient taking differently: Take 0.4 mg by mouth at bedtime. 06/15/17   Raylene Everts, MD  traZODone (DESYREL) 100 MG tablet Take 100 mg by mouth at bedtime. 01/04/20   [provider]  metoprolol tartrate (LOPRESSOR) 25 MG tablet Take 25 mg by mouth 2 (two) times daily. 05/09/20 06/16/20  [provider]  ramipril (ALTACE) 10 MG capsule Take 2 capsules (20 mg total) by mouth daily. 06/08/20 06/16/20  Barton Dubois, MD    Allergies    Patient has no known allergies.  Review of Systems   Review of Systems  Constitutional: Negative for fever.  HENT: Negative for sore throat.   Eyes: Negative for pain.  Respiratory: Positive for shortness of breath.   Cardiovascular: Positive for chest pain.  Gastrointestinal: Positive for abdominal pain. Negative for constipation, diarrhea, nausea and vomiting.  Genitourinary: Negative for dysuria.  Musculoskeletal: Negative for neck pain.  Skin: Negative for rash.  Neurological:  Negative for headaches.    Physical Exam Updated Vital Signs BP 121/82   Pulse (!) 125   Temp 97.6 F (36.4 C) (Oral)   Resp 18   SpO2 97%   Physical Exam Vitals and nursing note reviewed.  Constitutional:      Appearance: Normal appearance. He is well-developed and well-nourished.  HENT:     Head: Normocephalic and atraumatic.  Eyes:     Conjunctiva/sclera: Conjunctivae normal.  Cardiovascular:     Rate and Rhythm: Regular rhythm. Tachycardia present.     Heart sounds: No murmur heard.   Pulmonary:     Effort: Pulmonary effort is normal. No respiratory distress.     Breath sounds: Normal breath sounds.  Abdominal:     Palpations: Abdomen is soft.  Tenderness: There is abdominal tenderness in the epigastric area.  Musculoskeletal:        General: No deformity, signs of injury or edema.     Cervical back: Neck supple.  Skin:    General: Skin is warm and dry.     Capillary Refill: Capillary refill takes less than 2 seconds.  Neurological:     Mental Status: He is alert. Mental status is at baseline.  Psychiatric:        Mood and Affect: Mood and affect normal.     ED Results / Procedures / Treatments   Labs (all labs ordered are listed, but only abnormal results are displayed) Labs Reviewed  COMPREHENSIVE METABOLIC PANEL - Abnormal; Notable for the following components:      Result Value   Sodium 128 (*)    Chloride 93 (*)    Glucose, Bld 409 (*)    BUN 37 (*)    Creatinine, Ser 1.62 (*)    Total Protein 5.1 (*)    Albumin 2.5 (*)    AST 13 (*)    GFR, Estimated 46 (*)    All other components within normal limits  CBC - Abnormal; Notable for the following components:   WBC 13.3 (*)    RBC 4.20 (*)    Hemoglobin 12.2 (*)    HCT 37.7 (*)    Platelets 406 (*)    All other components within normal limits  URINALYSIS, ROUTINE W REFLEX MICROSCOPIC - Abnormal; Notable for the following components:   Glucose, UA >=500 (*)    Ketones, ur 5 (*)    All  other components within normal limits  LACTIC ACID, PLASMA - Abnormal; Notable for the following components:   Lactic Acid, Venous 2.5 (*)    All other components within normal limits  LACTIC ACID, PLASMA - Abnormal; Notable for the following components:   Lactic Acid, Venous 2.4 (*)    All other components within normal limits  MAGNESIUM - Abnormal; Notable for the following components:   Magnesium 1.2 (*)    All other components within normal limits  COMPREHENSIVE METABOLIC PANEL - Abnormal; Notable for the following components:   Sodium 128 (*)    Chloride 96 (*)    Glucose, Bld 379 (*)    BUN 33 (*)    Creatinine, Ser 1.41 (*)    Total Protein 4.2 (*)    Albumin 2.0 (*)    AST 10 (*)    GFR, Estimated 55 (*)    All other components within normal limits  MAGNESIUM - Abnormal; Notable for the following components:   Magnesium 1.5 (*)    All other components within normal limits  CBC WITH DIFFERENTIAL/PLATELET - Abnormal; Notable for the following components:   WBC 10.9 (*)    RBC 3.58 (*)    Hemoglobin 10.3 (*)    HCT 31.4 (*)    Neutro Abs 8.9 (*)    Abs Immature Granulocytes 0.16 (*)    All other components within normal limits  TSH - Abnormal; Notable for the following components:   TSH 0.312 (*)    All other components within normal limits  GLUCOSE, CAPILLARY - Abnormal; Notable for the following components:   Glucose-Capillary 332 (*)    All other components within normal limits  CULTURE, BLOOD (ROUTINE X 2)  CULTURE, BLOOD (ROUTINE X 2)  LIPASE, BLOOD  LACTIC ACID, PLASMA  IRON AND TIBC  FERRITIN  VITAMIN B12  FOLATE  TROPONIN I (HIGH SENSITIVITY)  TROPONIN I (HIGH SENSITIVITY)    EKG None  Radiology CT Abdomen Pelvis W Contrast  Result Date: 06/19/2020 CLINICAL DATA:  Epigastric pain. EXAM: CT ABDOMEN AND PELVIS WITH CONTRAST TECHNIQUE: Multidetector CT imaging of the abdomen and pelvis was performed using the standard protocol following bolus  administration of intravenous contrast. CONTRAST:  169mL OMNIPAQUE IOHEXOL 300 MG/ML  SOLN COMPARISON:  None. FINDINGS: Lower chest: Right basilar opacity slightly greater than typically seen with atelectasis. Small right pleural effusion. Distal esophagus is dilated, fluid-filled, with diffuse wall thickening. Hepatobiliary: Motion artifact through the liver and gallbladder. Allowing for this, no focal liver abnormality is seen. Mild diffusely decreased hepatic density typical of steatosis. No abnormal gallbladder distention or calcified gallstone. There is no biliary dilatation. Pancreas: Pancreas appears slightly truncated. Slight prominence of the proximal pancreatic duct at 3 mm. No peripancreatic fat stranding. Spleen: Normal in size without focal abnormality. Adrenals/Urinary Tract: No adrenal nodule. No normal left is seen. There may be a diminutive markedly atrophic kidney seen in the left renal fossa, series 2, image 30. There is no right hydronephrosis. Multiple cysts as well as low-density lesions that are too small to characterize throughout the right renal parenchyma. There is no right perinephric edema. No visualized renal calculi. Mild bladder distension without wall thickening. Stomach/Bowel: Patulous distal esophagus with wall thickening and intraluminal fluid. There is mild wall thickening at the gastroesophageal junction. Stomach is distended with intraluminal fluid. Equivocal pre pyloric gastric wall thickening. Normal positioning of the duodenum and ligament of Treitz. There is no small bowel obstruction or inflammatory change. High-riding cecum in the right upper quadrant. The appendix is tentatively visualized, series 2, image 33, likely normal but partially obscured by motion. No periappendiceal fat stranding. Colonic tortuosity with moderate volume of colonic stool. Moderate to large stool primarily in the left colon with stool distending the rectum, rectal distention of 7 cm. No colonic or  rectal wall thickening. Vascular/Lymphatic: Tortuous abdominal aorta with mild atherosclerosis. Patent portal vein. No acute vascular findings. No enlarged lymph nodes in the abdomen or pelvis. Reproductive: Prostate grossly normal, streak artifact from left hip arthroplasty obscures assessment. Other: No ascites or free air. Small fat containing umbilical hernia. 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 Complete L5-S1 disc space loss with associated facet hypertrophy. There is additional mild multilevel degenerative change in the spine. Left hip arthroplasty. Right hip osteoarthritis. IMPRESSION: 1. Patulous distal esophagus with wall thickening and intraluminal fluid, suggesting reflux or esophagitis. 2. Fluid-filled stomach with equivocal pre pyloric gastric wall thickening, can be seen with gastritis or peptic ulcer disease. 3. Moderate to large volume of colonic stool with stool distending the rectum, suggesting constipation. 4. Solitary right kidney with multiple cysts. There may be a diminutive markedly atrophic left kidney, but this is questionable. 5. Mild hepatic steatosis. 6. 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 Aortic Atherosclerosis (ICD10-I70.0). Electronically Signed   By: Keith Rake M.D.   On: 06/19/2020 23:45   DG Chest Port 1 View  Result Date: 06/19/2020 CLINICAL DATA:  Chest pain EXAM: PORTABLE CHEST 1 VIEW COMPARISON:  06/16/2020 FINDINGS: Low lung volumes with minimal atelectasis at the right base. No consolidation or effusion. Stable cardiomediastinal silhouette. No pneumothorax IMPRESSION: Low lung volumes with minimal right basilar atelectasis. Electronically Signed   By: Donavan Foil M.D.   On: 06/19/2020 22:15    Procedures Procedures (including critical care time)  Medications Ordered  in ED Medications  aspirin EC tablet 325 mg (325 mg Oral Given 06/20/20 0906)   amLODipine (NORVASC) tablet 5 mg (5 mg Oral Given 06/20/20 0904)  atorvastatin (LIPITOR) tablet 40 mg (40 mg Oral Given 06/20/20 0905)  amitriptyline (ELAVIL) tablet 10 mg (has no administration in time range)  donepezil (ARICEPT) tablet 10 mg (has no administration in time range)  memantine (NAMENDA) tablet 10 mg (10 mg Oral Given 06/20/20 0906)  traZODone (DESYREL) tablet 100 mg (has no administration in time range)  oxybutynin (DITROPAN-XL) 24 hr tablet 5 mg (5 mg Oral Given 06/20/20 0904)  tamsulosin (FLOMAX) capsule 0.4 mg (has no administration in time range)  clopidogrel (PLAVIX) tablet 75 mg (75 mg Oral Given 06/20/20 0905)  ferrous sulfate tablet 325 mg (325 mg Oral Given 06/20/20 0905)  heparin injection 5,000 Units (5,000 Units Subcutaneous Given 06/20/20 0559)  0.9 %  sodium chloride infusion ( Intravenous New Bag/Given 06/20/20 0600)  acetaminophen (TYLENOL) tablet 650 mg (has no administration in time range)    Or  acetaminophen (TYLENOL) suppository 650 mg (has no administration in time range)  polyethylene glycol (MIRALAX / GLYCOLAX) packet 17 g (has no administration in time range)  ondansetron (ZOFRAN) tablet 4 mg (has no administration in time range)    Or  ondansetron (ZOFRAN) injection 4 mg (has no administration in time range)  insulin aspart (novoLOG) injection 0-15 Units (11 Units Subcutaneous Given 06/20/20 0907)  insulin aspart (novoLOG) injection 0-5 Units (has no administration in time range)  alum & mag hydroxide-simeth (MAALOX/MYLANTA) 200-200-20 MG/5ML suspension 30 mL (has no administration in time range)  feeding supplement (ENSURE ENLIVE / ENSURE PLUS) liquid 237 mL (237 mLs Oral Not Given 06/20/20 0906)  insulin detemir (LEVEMIR) injection 12 Units (has no administration in time range)  pantoprazole (PROTONIX) injection 40 mg (40 mg Intravenous Given 06/20/20 0942)  polyethylene glycol (MIRALAX / GLYCOLAX) packet 17 g (17 g Oral Given 06/20/20 0942)  sodium chloride  0.9 % bolus 1,000 mL (0 mLs Intravenous Stopping Infusion hung by another clincian 06/19/20 2241)  iohexol (OMNIPAQUE) 300 MG/ML solution 100 mL (100 mLs Intravenous Contrast Given 06/19/20 2316)  magnesium sulfate IVPB 2 g 50 mL (0 g Intravenous Stopped 06/20/20 0031)  lactated ringers bolus 1,000 mL (1,000 mLs Intravenous New Bag/Given 06/20/20 0205)  pantoprazole (PROTONIX) injection 40 mg (40 mg Intravenous Given 06/20/20 0204)  magnesium sulfate IVPB 2 g 50 mL (2 g Intravenous New Bag/Given 06/20/20 [id number])    ED Course  I have reviewed the triage vital signs and the nursing notes.  Pertinent labs & imaging results that were available during my care of the patient were reviewed by me and considered in my medical decision making (see chart for details).  Clinical Course as of 06/20/20 1017  Tue Jun 19, 2020  2136 EKG showing sinus tachycardia rate of 116. Rightward axis. Normal intervals. Nonspecific ST-T wave changes. Otherwise no significant changes from 06/16/2020 [MB]  2219 Chest x-ray with no pneumothorax no infiltrates. [MB]    Clinical Course User Index [MB] Hayden Rasmussen, MD   MDM Rules/Calculators/A&P                         This patient complains of chest and abdominal pain; this involves an extensive number of treatment Options and is a complaint that carries with it a high risk of complications and Morbidity. The differential includes ACS, pneumonia, pneumothorax, PE, gastritis, peptic ulcer disease, cholelithiasis, cholecystitis,  colitis, sepsis    I ordered, reviewed and interpreted labs, which included CBC with elevated white count, stable hemoglobin, chemistries with mild hyponatremia and elevated glucose, creatinine slightly worse than baseline, lactic acid elevated, troponins flat, urinalysis without signs of infection I ordered medication IV fluids I ordered imaging studies which included chest x-ray and CT abdomen and pelvis and I independently    visualized and  interpreted imaging which showed no acute infiltrates Additional history obtained from EMS Previous records obtained and reviewed in epic, 2 recent ED visits for nonspecific symptoms last 10 days  After the interventions stated above, I reevaluated the patient and found patient still to be tachycardic.  He is signed out to oncoming provider Dr. Roxanne Mins to follow-up on CT and trend lactates.  May need admission if any significant findings on CT or if his tachycardia and lactate do not normalize.   Final Clinical Impression(s) / ED Diagnoses Final diagnoses:  Upper abdominal pain  Elevated lactic acid level  Tachycardia  Acute kidney injury (nontraumatic) (HCC)  Hyponatremia  Normochromic normocytic anemia    Rx / DC Orders ED Discharge Orders    None       Hayden Rasmussen, MD 06/20/20 1021

## 2020-06-20 ENCOUNTER — Encounter (HOSPITAL_COMMUNITY): Payer: Self-pay | Admitting: Family Medicine

## 2020-06-20 DIAGNOSIS — K8689 Other specified diseases of pancreas: Secondary | ICD-10-CM

## 2020-06-20 DIAGNOSIS — K317 Polyp of stomach and duodenum: Secondary | ICD-10-CM | POA: Diagnosis not present

## 2020-06-20 DIAGNOSIS — D519 Vitamin B12 deficiency anemia, unspecified: Secondary | ICD-10-CM | POA: Diagnosis present

## 2020-06-20 DIAGNOSIS — E871 Hypo-osmolality and hyponatremia: Secondary | ICD-10-CM | POA: Diagnosis present

## 2020-06-20 DIAGNOSIS — N179 Acute kidney failure, unspecified: Secondary | ICD-10-CM | POA: Diagnosis present

## 2020-06-20 DIAGNOSIS — E440 Moderate protein-calorie malnutrition: Secondary | ICD-10-CM | POA: Diagnosis present

## 2020-06-20 DIAGNOSIS — Q453 Other congenital malformations of pancreas and pancreatic duct: Secondary | ICD-10-CM

## 2020-06-20 DIAGNOSIS — E7800 Pure hypercholesterolemia, unspecified: Secondary | ICD-10-CM | POA: Diagnosis present

## 2020-06-20 DIAGNOSIS — K2100 Gastro-esophageal reflux disease with esophagitis, without bleeding: Secondary | ICD-10-CM | POA: Diagnosis present

## 2020-06-20 DIAGNOSIS — R109 Unspecified abdominal pain: Secondary | ICD-10-CM | POA: Diagnosis present

## 2020-06-20 DIAGNOSIS — E876 Hypokalemia: Secondary | ICD-10-CM | POA: Diagnosis not present

## 2020-06-20 DIAGNOSIS — K5900 Constipation, unspecified: Secondary | ICD-10-CM

## 2020-06-20 DIAGNOSIS — K254 Chronic or unspecified gastric ulcer with hemorrhage: Secondary | ICD-10-CM | POA: Diagnosis present

## 2020-06-20 DIAGNOSIS — E1165 Type 2 diabetes mellitus with hyperglycemia: Secondary | ICD-10-CM | POA: Diagnosis present

## 2020-06-20 DIAGNOSIS — E872 Acidosis: Secondary | ICD-10-CM | POA: Diagnosis present

## 2020-06-20 DIAGNOSIS — R933 Abnormal findings on diagnostic imaging of other parts of digestive tract: Secondary | ICD-10-CM

## 2020-06-20 DIAGNOSIS — K319 Disease of stomach and duodenum, unspecified: Secondary | ICD-10-CM | POA: Diagnosis present

## 2020-06-20 DIAGNOSIS — K760 Fatty (change of) liver, not elsewhere classified: Secondary | ICD-10-CM | POA: Diagnosis present

## 2020-06-20 DIAGNOSIS — D649 Anemia, unspecified: Secondary | ICD-10-CM | POA: Diagnosis not present

## 2020-06-20 DIAGNOSIS — Z6822 Body mass index (BMI) 22.0-22.9, adult: Secondary | ICD-10-CM | POA: Diagnosis not present

## 2020-06-20 DIAGNOSIS — I10 Essential (primary) hypertension: Secondary | ICD-10-CM | POA: Diagnosis present

## 2020-06-20 DIAGNOSIS — D638 Anemia in other chronic diseases classified elsewhere: Secondary | ICD-10-CM | POA: Diagnosis present

## 2020-06-20 DIAGNOSIS — Z20822 Contact with and (suspected) exposure to covid-19: Secondary | ICD-10-CM | POA: Diagnosis present

## 2020-06-20 DIAGNOSIS — E785 Hyperlipidemia, unspecified: Secondary | ICD-10-CM | POA: Diagnosis present

## 2020-06-20 DIAGNOSIS — Z79899 Other long term (current) drug therapy: Secondary | ICD-10-CM | POA: Diagnosis not present

## 2020-06-20 DIAGNOSIS — R1010 Upper abdominal pain, unspecified: Secondary | ICD-10-CM

## 2020-06-20 DIAGNOSIS — J9811 Atelectasis: Secondary | ICD-10-CM | POA: Diagnosis present

## 2020-06-20 DIAGNOSIS — Z8673 Personal history of transient ischemic attack (TIA), and cerebral infarction without residual deficits: Secondary | ICD-10-CM | POA: Diagnosis not present

## 2020-06-20 DIAGNOSIS — K2970 Gastritis, unspecified, without bleeding: Secondary | ICD-10-CM | POA: Diagnosis present

## 2020-06-20 LAB — CBC WITH DIFFERENTIAL/PLATELET
Abs Immature Granulocytes: 0.16 10*3/uL — ABNORMAL HIGH (ref 0.00–0.07)
Basophils Absolute: 0 10*3/uL (ref 0.0–0.1)
Basophils Relative: 0 %
Eosinophils Absolute: 0 10*3/uL (ref 0.0–0.5)
Eosinophils Relative: 0 %
HCT: 31.4 % — ABNORMAL LOW (ref 39.0–52.0)
Hemoglobin: 10.3 g/dL — ABNORMAL LOW (ref 13.0–17.0)
Immature Granulocytes: 2 %
Lymphocytes Relative: 10 %
Lymphs Abs: 1.1 10*3/uL (ref 0.7–4.0)
MCH: 28.8 pg (ref 26.0–34.0)
MCHC: 32.8 g/dL (ref 30.0–36.0)
MCV: 87.7 fL (ref 80.0–100.0)
Monocytes Absolute: 0.7 10*3/uL (ref 0.1–1.0)
Monocytes Relative: 7 %
Neutro Abs: 8.9 10*3/uL — ABNORMAL HIGH (ref 1.7–7.7)
Neutrophils Relative %: 81 %
Platelets: 367 10*3/uL (ref 150–400)
RBC: 3.58 MIL/uL — ABNORMAL LOW (ref 4.22–5.81)
RDW: 14.6 % (ref 11.5–15.5)
WBC: 10.9 10*3/uL — ABNORMAL HIGH (ref 4.0–10.5)
nRBC: 0 % (ref 0.0–0.2)

## 2020-06-20 LAB — COMPREHENSIVE METABOLIC PANEL
ALT: 21 U/L (ref 0–44)
AST: 10 U/L — ABNORMAL LOW (ref 15–41)
Albumin: 2 g/dL — ABNORMAL LOW (ref 3.5–5.0)
Alkaline Phosphatase: 40 U/L (ref 38–126)
Anion gap: 7 (ref 5–15)
BUN: 33 mg/dL — ABNORMAL HIGH (ref 8–23)
CO2: 25 mmol/L (ref 22–32)
Calcium: 9.3 mg/dL (ref 8.9–10.3)
Chloride: 96 mmol/L — ABNORMAL LOW (ref 98–111)
Creatinine, Ser: 1.41 mg/dL — ABNORMAL HIGH (ref 0.61–1.24)
GFR, Estimated: 55 mL/min — ABNORMAL LOW (ref >60)
Glucose, Bld: 379 mg/dL — ABNORMAL HIGH (ref 70–99)
Potassium: 4.8 mmol/L (ref 3.5–5.1)
Sodium: 128 mmol/L — ABNORMAL LOW (ref 135–145)
Total Bilirubin: 0.6 mg/dL (ref 0.3–1.2)
Total Protein: 4.2 g/dL — ABNORMAL LOW (ref 6.5–8.1)

## 2020-06-20 LAB — IRON AND TIBC
Iron: 45 ug/dL (ref 45–182)
Saturation Ratios: 29 % (ref 17.9–39.5)
TIBC: 157 ug/dL — ABNORMAL LOW (ref 250–450)
UIBC: 112 ug/dL

## 2020-06-20 LAB — URINALYSIS, ROUTINE W REFLEX MICROSCOPIC
Bacteria, UA: NONE SEEN
Bilirubin Urine: NEGATIVE
Glucose, UA: >=500 mg/dL — AB
Hgb urine dipstick: NEGATIVE
Ketones, ur: 5 mg/dL — AB
Leukocytes,Ua: NEGATIVE
Nitrite: NEGATIVE
Protein, ur: NEGATIVE mg/dL
Specific Gravity, Urine: 1.03 (ref 1.005–1.030)
pH: 5 (ref 5.0–8.0)

## 2020-06-20 LAB — TSH: TSH: 0.312 u[IU]/mL — ABNORMAL LOW (ref 0.350–4.500)

## 2020-06-20 LAB — GLUCOSE, CAPILLARY
Glucose-Capillary: 332 mg/dL — ABNORMAL HIGH (ref 70–99)
Glucose-Capillary: 288 mg/dL — ABNORMAL HIGH (ref 70–99)
Glucose-Capillary: 236 mg/dL — ABNORMAL HIGH (ref 70–99)
Glucose-Capillary: 147 mg/dL — ABNORMAL HIGH (ref 70–99)

## 2020-06-20 LAB — FOLATE: Folate: 5.9 ng/mL — ABNORMAL LOW (ref >5.9)

## 2020-06-20 LAB — VITAMIN B12: Vitamin B-12: 173 pg/mL — ABNORMAL LOW (ref 180–914)

## 2020-06-20 LAB — MAGNESIUM: Magnesium: 1.5 mg/dL — ABNORMAL LOW (ref 1.7–2.4)

## 2020-06-20 LAB — FERRITIN: Ferritin: 109 ng/mL (ref 24–336)

## 2020-06-20 LAB — LACTIC ACID, PLASMA: Lactic Acid, Venous: 0.9 mmol/L (ref 0.5–1.9)

## 2020-06-20 MED ORDER — ENSURE ENLIVE PO LIQD
237.0000 mL | Freq: Two times a day (BID) | ORAL | Status: DC
  Administered 2020-06-23 – 2020-06-26 (×6): 237 mL via ORAL

## 2020-06-20 MED ORDER — INSULIN ASPART 100 UNIT/ML ~~LOC~~ SOLN
0.0000 [IU] | Freq: Every day | SUBCUTANEOUS | Status: DC
  Administered 2020-06-21: 2 [IU] via SUBCUTANEOUS

## 2020-06-20 MED ORDER — FERROUS SULFATE 325 (65 FE) MG PO TABS
325.0000 mg | ORAL_TABLET | Freq: Every day | ORAL | Status: DC
  Administered 2020-06-20: 325 mg via ORAL
  Filled 2020-06-20: qty 1

## 2020-06-20 MED ORDER — PANTOPRAZOLE SODIUM 40 MG IV SOLR
40.0000 mg | Freq: Once | INTRAVENOUS | Status: AC
  Administered 2020-06-20: 40 mg via INTRAVENOUS
  Filled 2020-06-20: qty 40

## 2020-06-20 MED ORDER — ASPIRIN EC 325 MG PO TBEC
325.0000 mg | DELAYED_RELEASE_TABLET | Freq: Every day | ORAL | Status: DC
  Administered 2020-06-20: 325 mg via ORAL
  Filled 2020-06-20: qty 1

## 2020-06-20 MED ORDER — ACETAMINOPHEN 650 MG RE SUPP: 650.0000 mg | Freq: Four times a day (QID) | RECTAL | Status: DC | PRN

## 2020-06-20 MED ORDER — ONDANSETRON HCL 4 MG PO TABS: 4.0000 mg | ORAL_TABLET | Freq: Four times a day (QID) | ORAL | Status: DC | PRN

## 2020-06-20 MED ORDER — SODIUM CHLORIDE 0.9 % IV SOLN: INTRAVENOUS | Status: DC

## 2020-06-20 MED ORDER — ACETAMINOPHEN 325 MG PO TABS: 650.0000 mg | ORAL_TABLET | Freq: Four times a day (QID) | ORAL | Status: DC | PRN

## 2020-06-20 MED ORDER — DONEPEZIL HCL 5 MG PO TABS
10.0000 mg | ORAL_TABLET | Freq: Every day | ORAL | Status: DC
  Administered 2020-06-20 – 2020-06-25 (×6): 10 mg via ORAL
  Filled 2020-06-20 (×6): qty 2

## 2020-06-20 MED ORDER — TRAZODONE HCL 50 MG PO TABS
100.0000 mg | ORAL_TABLET | Freq: Every day | ORAL | Status: DC
  Administered 2020-06-20 – 2020-06-25 (×6): 100 mg via ORAL
  Filled 2020-06-20 (×6): qty 2

## 2020-06-20 MED ORDER — METOPROLOL TARTRATE 25 MG PO TABS
25.0000 mg | ORAL_TABLET | Freq: Two times a day (BID) | ORAL | Status: DC
  Administered 2020-06-20 – 2020-06-26 (×13): 25 mg via ORAL
  Filled 2020-06-20 (×13): qty 1

## 2020-06-20 MED ORDER — POLYETHYLENE GLYCOL 3350 17 G PO PACK
17.0000 g | PACK | Freq: Every day | ORAL | Status: DC
  Administered 2020-06-20 – 2020-06-26 (×6): 17 g via ORAL
  Filled 2020-06-20 (×6): qty 1

## 2020-06-20 MED ORDER — OXYBUTYNIN CHLORIDE ER 5 MG PO TB24
5.0000 mg | ORAL_TABLET | Freq: Every day | ORAL | Status: DC
  Administered 2020-06-20 – 2020-06-26 (×7): 5 mg via ORAL
  Filled 2020-06-20 (×7): qty 1

## 2020-06-20 MED ORDER — AMITRIPTYLINE HCL 10 MG PO TABS
10.0000 mg | ORAL_TABLET | Freq: Every day | ORAL | Status: DC
  Administered 2020-06-20 – 2020-06-25 (×6): 10 mg via ORAL
  Filled 2020-06-20 (×6): qty 1

## 2020-06-20 MED ORDER — POLYETHYLENE GLYCOL 3350 17 G PO PACK: 17.0000 g | PACK | Freq: Every day | ORAL | Status: DC | PRN

## 2020-06-20 MED ORDER — CLOPIDOGREL BISULFATE 75 MG PO TABS
75.0000 mg | ORAL_TABLET | Freq: Every day | ORAL | Status: DC
  Administered 2020-06-20 – 2020-06-24 (×4): 75 mg via ORAL
  Filled 2020-06-20 (×6): qty 1

## 2020-06-20 MED ORDER — METOPROLOL TARTRATE 5 MG/5ML IV SOLN: 5.0000 mg | Freq: Four times a day (QID) | INTRAVENOUS | Status: DC | PRN

## 2020-06-20 MED ORDER — PANTOPRAZOLE SODIUM 40 MG IV SOLR
40.0000 mg | Freq: Two times a day (BID) | INTRAVENOUS | Status: DC
  Administered 2020-06-20 – 2020-06-26 (×11): 40 mg via INTRAVENOUS
  Filled 2020-06-20 (×13): qty 40

## 2020-06-20 MED ORDER — FOLIC ACID 1 MG PO TABS
1.0000 mg | ORAL_TABLET | Freq: Every day | ORAL | Status: DC
  Administered 2020-06-20 – 2020-06-26 (×7): 1 mg via ORAL
  Filled 2020-06-20 (×8): qty 1

## 2020-06-20 MED ORDER — ONDANSETRON HCL 4 MG/2ML IJ SOLN: 4.0000 mg | Freq: Four times a day (QID) | INTRAMUSCULAR | Status: DC | PRN

## 2020-06-20 MED ORDER — ATORVASTATIN CALCIUM 40 MG PO TABS
40.0000 mg | ORAL_TABLET | Freq: Every day | ORAL | Status: DC
  Administered 2020-06-20 – 2020-06-26 (×7): 40 mg via ORAL
  Filled 2020-06-20 (×7): qty 1

## 2020-06-20 MED ORDER — HEPARIN SODIUM (PORCINE) 5000 UNIT/ML IJ SOLN
5000.0000 [IU] | Freq: Three times a day (TID) | INTRAMUSCULAR | Status: DC
  Administered 2020-06-20 (×3): 5000 [IU] via SUBCUTANEOUS
  Filled 2020-06-20 (×3): qty 1

## 2020-06-20 MED ORDER — INSULIN DETEMIR 100 UNIT/ML ~~LOC~~ SOLN
12.0000 [IU] | Freq: Every day | SUBCUTANEOUS | Status: DC
  Administered 2020-06-20 – 2020-06-26 (×6): 12 [IU] via SUBCUTANEOUS
  Filled 2020-06-20 (×9): qty 0.12

## 2020-06-20 MED ORDER — LACTATED RINGERS IV BOLUS
1000.0000 mL | Freq: Once | INTRAVENOUS | Status: AC
  Administered 2020-06-20: 1000 mL via INTRAVENOUS

## 2020-06-20 MED ORDER — MEMANTINE HCL 10 MG PO TABS
10.0000 mg | ORAL_TABLET | Freq: Two times a day (BID) | ORAL | Status: DC
  Administered 2020-06-20 – 2020-06-26 (×13): 10 mg via ORAL
  Filled 2020-06-20 (×13): qty 1

## 2020-06-20 MED ORDER — INSULIN ASPART 100 UNIT/ML ~~LOC~~ SOLN
0.0000 [IU] | Freq: Three times a day (TID) | SUBCUTANEOUS | Status: DC
  Administered 2020-06-20: 5 [IU] via SUBCUTANEOUS
  Administered 2020-06-20: 11 [IU] via SUBCUTANEOUS
  Administered 2020-06-20: 8 [IU] via SUBCUTANEOUS
  Administered 2020-06-22 – 2020-06-23 (×3): 3 [IU] via SUBCUTANEOUS
  Administered 2020-06-23: 5 [IU] via SUBCUTANEOUS
  Administered 2020-06-23: 1 [IU] via SUBCUTANEOUS
  Administered 2020-06-24 – 2020-06-25 (×3): 2 [IU] via SUBCUTANEOUS
  Administered 2020-06-25: 5 [IU] via SUBCUTANEOUS
  Administered 2020-06-26: 3 [IU] via SUBCUTANEOUS

## 2020-06-20 MED ORDER — ALUM & MAG HYDROXIDE-SIMETH 200-200-20 MG/5ML PO SUSP: 30.0000 mL | Freq: Four times a day (QID) | ORAL | Status: DC | PRN

## 2020-06-20 MED ORDER — TAMSULOSIN HCL 0.4 MG PO CAPS
0.4000 mg | ORAL_CAPSULE | Freq: Every day | ORAL | Status: DC
  Administered 2020-06-20 – 2020-06-25 (×6): 0.4 mg via ORAL
  Filled 2020-06-20 (×6): qty 1

## 2020-06-20 MED ORDER — CYANOCOBALAMIN 1000 MCG/ML IJ SOLN
1000.0000 ug | Freq: Once | INTRAMUSCULAR | Status: AC
  Administered 2020-06-20: 1000 ug via INTRAMUSCULAR
  Filled 2020-06-20: qty 1

## 2020-06-20 MED ORDER — MAGNESIUM SULFATE 2 GM/50ML IV SOLN
2.0000 g | Freq: Once | INTRAVENOUS | Status: AC
  Administered 2020-06-20: 2 g via INTRAVENOUS
  Filled 2020-06-20: qty 50

## 2020-06-20 MED ORDER — PANTOPRAZOLE SODIUM 40 MG IV SOLR: 40.0000 mg | INTRAVENOUS | Status: DC

## 2020-06-20 MED ORDER — AMLODIPINE BESYLATE 5 MG PO TABS
5.0000 mg | ORAL_TABLET | Freq: Every day | ORAL | Status: DC
  Administered 2020-06-20: 5 mg via ORAL
  Filled 2020-06-20: qty 1

## 2020-06-20 NOTE — Consult Note (Addendum)
@LOGO @   Referring Provider: Dr. Manuella Ghazi Primary Care Physician:  Leonie Douglas, MD Primary Gastroenterologist:  Dr. Laural Golden  Date of Admission: 06/19/2020 Date of Consultation: 06/20/2020  Reason for Consultation: Abnormal CT abdomen with gastritis/esophagitis  HPI:  Daniel [name] is a 70 y.o. year old male with history of hypertension, hyperlipidemia, diabetes mellitus, CVA, and cognitive impairment residing at [facility] who presented to the ED with a chief complaint of epigastric pain.    In the ED Temperature 97.6, heart rate 115, respiratory rate 19, blood pressure 120/69, satting at 98% Lactic acid 2.5 and then 2.4 WBC 13.3, hemoglobin 12.2, sodium 128, potassium 5.0, mag 1.2, creatinine 1.62, glucose 409, LFTs and T bili normal, lipase normal, troponins flat.  Blood cultures pending.  UA without evidence of UTI. Chest x-ray showed right basilar atelectasis. CT abdomen/pelvis shows a dilated esophagus that is fluid-filled with diffuse wall thickening, fluid-filled stomach with equivocal prepyloric gastric wall thickening that can be seen with gastritis or PUD, slight prominence of proximal pancreatic duct at 3 mm, colonic tortuosity with moderate volume of colonic stool with stool distending the rectum, hepatic steatosis, and solitary right kidney with multiple cysts.  He received 2 g of magnesium, 1 L of LR, 1 L of NaCl, and Protonix in the ED  He had clinical improvement in the ED of epigastric abdominal pain. Plans to admit for observation. GI consulted due to abdominal pain and abnormal CT findings.  Today: Reports low substernal chest pain that started last week occurring daily and worsened by meals. No relieving factors. Minimal pain at this time. Occasional sensation of foods getting hung in the lower esophagus. States he has vomited once but can't tell me when.  Denies recent vomiting.  No nausea at this time. No other abdominal pain. Denies GERD.  Denies constipation.   Cannot tell me how often he has a bowel movement.  Admits to having to strain at times to have a bowel movement.  No brbpr or melena.   No CP or shortness of breath. Denies other pain for which he has to take any pain medications.   Past Medical History:  Diagnosis Date  . Diabetes mellitus without complication ([place])   . High cholesterol   . Hypertension     Past Surgical History:  Procedure Laterality Date  . HIP SURGERY     left  . KIDNEY SURGERY      Prior to Admission medications   Medication Sig Start Date End Date Taking? Authorizing Provider  acetaminophen (TYLENOL) 650 MG CR tablet Take 650 mg by mouth 2 (two) times daily.    [provider]  amitriptyline (ELAVIL) 10 MG tablet Take 10 mg by mouth at bedtime. 04/30/20   [provider]  amLODipine (NORVASC) 5 MG tablet Take 1 tablet (5 mg total) by mouth daily. 06/16/20   Isla Pence, MD  aspirin EC 325 MG tablet Take 1 tablet (325 mg total) by mouth daily. 02/21/20   Evalee Jefferson, PA-C  atorvastatin (LIPITOR) 40 MG tablet Take 1 tablet (40 mg total) by mouth daily. 01/07/20 02/06/20  Darliss Cheney, MD  Cholecalciferol (VITAMIN D3) 2000 units capsule TAKE 1 CAPSULE BY MOUTH ONCE DAILY. **DO NOT CRUSH** Patient taking differently: Take 2,000 Units by mouth daily. 01/06/18   Fayrene Helper, MD  clopidogrel (PLAVIX) 75 MG tablet Take 75 mg by mouth daily.    [provider]  donepezil (ARICEPT) 10 MG tablet TAKE (1) TABLET BY MOUTH ONCE DAILY.  Patient taking differently: Take 10 mg by mouth at bedtime. 06/15/17   Raylene Everts, MD  FEROSUL 325 (65 Fe) MG tablet TAKE 1 TABLET BY MOUTH TWICE DAILY. Patient taking differently: Take 325 mg by mouth in the morning and at bedtime. 06/15/17   Raylene Everts, MD  FORA LANCETS MISC by Does not apply route. Test once daily    [provider]  gentian violet 1 % topical solution Apply 1 mL topically See admin instructions. Applied between toes  daily    [provider]  glucose blood test strip 1 each by Other route daily. Use as instructed    [provider]  magnesium oxide (MAG-OX) 400 MG tablet Take 1 tablet (400 mg total) by mouth daily. 06/08/20   Barton Dubois, MD  Magnesium Oxide (MAGNESIUM OXIDE 400) 240 MG PACK Take 1 capsule by mouth daily. 06/16/20   Isla Pence, MD  memantine (NAMENDA) 10 MG tablet Take 10 mg by mouth 2 (two) times daily. 06/07/20   [provider]  metFORMIN (GLUCOPHAGE) 1000 MG tablet Take 1 tablet (1,000 mg total) by mouth 2 (two) times daily with a meal. 06/08/20   Barton Dubois, MD  oxybutynin (DITROPAN-XL) 5 MG 24 hr tablet Take 5 mg by mouth daily. 01/04/20   [provider]  potassium chloride (KLOR-CON) 10 MEQ tablet Take 2 tablets (20 mEq total) by mouth daily. 06/08/20   Barton Dubois, MD  Semaglutide,0.25 or 0.5MG /DOS, (OZEMPIC, 0.25 OR 0.5 MG/DOSE,) 2 MG/1.5ML SOPN Inject 0.5 mg into the skin once a week. 06/08/20 09/06/20  Barton Dubois, MD  tamsulosin (FLOMAX) 0.4 MG CAPS capsule TAKE 1 CAPSULE BY MOUTH AT BEDTIME. Patient taking differently: Take 0.4 mg by mouth at bedtime. 06/15/17   Raylene Everts, MD  traZODone (DESYREL) 100 MG tablet Take 100 mg by mouth at bedtime. 01/04/20   [provider]  metoprolol tartrate (LOPRESSOR) 25 MG tablet Take 25 mg by mouth 2 (two) times daily. 05/09/20 06/16/20  [provider]  ramipril (ALTACE) 10 MG capsule Take 2 capsules (20 mg total) by mouth daily. 06/08/20 06/16/20  Barton Dubois, MD    Current Facility-Administered Medications  Medication Dose Route Frequency Provider Last Rate Last Admin  . 0.9 %  sodium chloride infusion   Intravenous Continuous Zierle-Ghosh, Asia B, DO 75 mL/hr at 06/20/20 0600 New Bag at 06/20/20 0600  . acetaminophen (TYLENOL) tablet 650 mg  650 mg Oral Q6H PRN Zierle-Ghosh, Asia B, DO       Or  . acetaminophen (TYLENOL) suppository 650 mg  650 mg Rectal Q6H PRN  Zierle-Ghosh, Asia B, DO      . alum & mag hydroxide-simeth (MAALOX/MYLANTA) 200-200-20 MG/5ML suspension 30 mL  30 mL Oral Q6H PRN Zierle-Ghosh, Asia B, DO      . amitriptyline (ELAVIL) tablet 10 mg  10 mg Oral QHS Zierle-Ghosh, Asia B, DO      . amLODipine (NORVASC) tablet 5 mg  5 mg Oral Daily Zierle-Ghosh, Asia B, DO      . aspirin EC tablet 325 mg  325 mg Oral Daily Zierle-Ghosh, Asia B, DO      . atorvastatin (LIPITOR) tablet 40 mg  40 mg Oral Daily Zierle-Ghosh, Asia B, DO      . clopidogrel (PLAVIX) tablet 75 mg  75 mg Oral Daily Zierle-Ghosh, Asia B, DO      . donepezil (ARICEPT) tablet 10 mg  10 mg Oral QHS Zierle-Ghosh, Asia B, DO      .  feeding supplement (ENSURE ENLIVE / ENSURE PLUS) liquid 237 mL  237 mL Oral BID BM Zierle-Ghosh, Asia B, DO      . ferrous sulfate tablet 325 mg  325 mg Oral Q breakfast Zierle-Ghosh, Asia B, DO      . heparin injection 5,000 Units  5,000 Units Subcutaneous Q8H Zierle-Ghosh, Asia B, DO   5,000 Units at 06/20/20 0559  . insulin aspart (novoLOG) injection 0-15 Units  0-15 Units Subcutaneous TID WC Zierle-Ghosh, Asia B, DO      . insulin aspart (novoLOG) injection 0-5 Units  0-5 Units Subcutaneous QHS Zierle-Ghosh, Asia B, DO      . magnesium sulfate IVPB 2 g 50 mL  2 g Intravenous Once Manuella Ghazi, Pratik D, DO      . memantine (NAMENDA) tablet 10 mg  10 mg Oral BID Zierle-Ghosh, Asia B, DO      . ondansetron (ZOFRAN) tablet 4 mg  4 mg Oral Q6H PRN Zierle-Ghosh, Asia B, DO       Or  . ondansetron (ZOFRAN) injection 4 mg  4 mg Intravenous Q6H PRN Zierle-Ghosh, Asia B, DO      . oxybutynin (DITROPAN-XL) 24 hr tablet 5 mg  5 mg Oral Daily Zierle-Ghosh, Asia B, DO      . pantoprazole (PROTONIX) injection 40 mg  40 mg Intravenous Q24H Shah, Pratik D, DO      . polyethylene glycol (MIRALAX / GLYCOLAX) packet 17 g  17 g Oral Daily PRN Zierle-Ghosh, Asia B, DO      . tamsulosin (FLOMAX) capsule 0.4 mg  0.4 mg Oral QHS Zierle-Ghosh, Asia B, DO      . traZODone  (DESYREL) tablet 100 mg  100 mg Oral QHS Zierle-Ghosh, Asia B, DO        Allergies as of 06/19/2020  . (No Known Allergies)    Family History  Family history unknown: Yes    Social History   Socioeconomic History  . Marital status: Single    Spouse name: Not on file  . Number of children: Not on file  . Years of education: Not on file  . Highest education level: Not on file  Occupational History  . Not on file  Tobacco Use  . Smoking status: Never Smoker  . Smokeless tobacco: Never Used  Vaping Use  . Vaping Use: Never used  Substance and Sexual Activity  . Alcohol use: No  . Drug use: No  . Sexual activity: Never  Other Topics Concern  . Not on file  Social History Narrative  . Not on file   Social Determinants of Health   Financial Resource Strain: Not on file  Food Insecurity: Not on file  Transportation Needs: Not on file  Physical Activity: Not on file  Stress: Not on file  Social Connections: Not on file  Intimate Partner Violence: Not on file    Review of Systems: Gen: Denies fever, chills, cold or flu like symptoms, lightheadedness, or dizziness.  CV: Denies chest pain or palpitations.  Resp: Denies shortness of breath or cough.  GI: See HPI MS: Denies joint pain Heme: Denies bruising or bleeding.   Physical Exam: Vital signs in last 24 hours: Temp:  [97.6 F (36.4 C)-98.7 F (37.1 C)] 98.7 F (37.1 C) (01/12 0449) Pulse Rate:  [99-125] 100 (01/12 0449) Resp:  [18-20] 19 (01/12 0449) BP: (105-126)/(66-82) 112/74 (01/12 0449) SpO2:  [82 %-98 %] 97 % (01/12 0449)   General:   Alert and oriented to self  and situation,  well-developed, well-nourished, pleasant and cooperative in NAD Head:  Normocephalic and atraumatic. Eyes:  Sclera clear, no icterus. Conjunctiva pink. Ears:  Normal auditory acuity. Lungs:  Clear throughout to auscultation.   No wheezes, crackles, or rhonchi. No acute distress. Heart:  Regular rhythm. Tachycardic with HR 115.  No murmurs, clicks, rubs,  or gallops. Abdomen:  Soft, nontender and nondistended. No masses, hepatosplenomegaly or hernias noted. Normal bowel sounds, without guarding, and without rebound.   Rectal:  No external lesions. Internal exam with soft stool in the rectal vault. No appreciable masses. Gloved exam finger with dark green stool, no bright red blood.  Msk: Symmetrical without gross deformities. Normal posture. Extremities:  Without edema. Neurologic:  Alert and  Oriented to self and situation.  Skin:  Intact without significant lesions or rashes. Psych:  Flat affect.   Intake/Output from previous day: 01/11 0701 - 01/12 0700 In: 47.2 [IV Piggyback:47.2] Out: -  Intake/Output this shift: No intake/output data recorded.  Lab Results: Recent Labs    06/19/20 2032 06/20/20 0533  WBC 13.3* 10.9*  HGB 12.2* 10.3*  HCT 37.7* 31.4*  PLT 406* 367   BMET Recent Labs    06/19/20 2032 06/20/20 0533  NA 128* 128*  K 5.0 4.8  CL 93* 96*  CO2 24 25  GLUCOSE 409* 379*  BUN 37* 33*  CREATININE 1.62* 1.41*  CALCIUM 10.0 9.3   LFT Recent Labs    06/19/20 2032 06/20/20 0533  PROT 5.1* 4.2*  ALBUMIN 2.5* 2.0*  AST 13* 10*  ALT 22 21  ALKPHOS 53 40  BILITOT 0.5 0.6   Studies/Results: CT Abdomen Pelvis W Contrast  Result Date: 06/19/2020 CLINICAL DATA:  Epigastric pain. EXAM: CT ABDOMEN AND PELVIS WITH CONTRAST TECHNIQUE: Multidetector CT imaging of the abdomen and pelvis was performed using the standard protocol following bolus administration of intravenous contrast. CONTRAST:  187mL OMNIPAQUE IOHEXOL 300 MG/ML  SOLN COMPARISON:  None. FINDINGS: Lower chest: Right basilar opacity slightly greater than typically seen with atelectasis. Small right pleural effusion. Distal esophagus is dilated, fluid-filled, with diffuse wall thickening. Hepatobiliary: Motion artifact through the liver and gallbladder. Allowing for this, no focal liver abnormality is seen. Mild diffusely  decreased hepatic density typical of steatosis. No abnormal gallbladder distention or calcified gallstone. There is no biliary dilatation. Pancreas: Pancreas appears slightly truncated. Slight prominence of the proximal pancreatic duct at 3 mm. No peripancreatic fat stranding. Spleen: Normal in size without focal abnormality. Adrenals/Urinary Tract: No adrenal nodule. No normal left is seen. There may be a diminutive markedly atrophic kidney seen in the left renal fossa, series 2, image 30. There is no right hydronephrosis. Multiple cysts as well as low-density lesions that are too small to characterize throughout the right renal parenchyma. There is no right perinephric edema. No visualized renal calculi. Mild bladder distension without wall thickening. Stomach/Bowel: Patulous distal esophagus with wall thickening and intraluminal fluid. There is mild wall thickening at the gastroesophageal junction. Stomach is distended with intraluminal fluid. Equivocal pre pyloric gastric wall thickening. Normal positioning of the duodenum and ligament of Treitz. There is no small bowel obstruction or inflammatory change. High-riding cecum in the right upper quadrant. The appendix is tentatively visualized, series 2, image 33, likely normal but partially obscured by motion. No periappendiceal fat stranding. Colonic tortuosity with moderate volume of colonic stool. Moderate to large stool primarily in the left colon with stool distending the rectum, rectal distention of 7 cm. No colonic or rectal wall  thickening. Vascular/Lymphatic: Tortuous abdominal aorta with mild atherosclerosis. Patent portal vein. No acute vascular findings. No enlarged lymph nodes in the abdomen or pelvis. Reproductive: Prostate grossly normal, streak artifact from left hip arthroplasty obscures assessment. Other: No ascites or free air. Small fat containing umbilical hernia. Musculoskeletal: Bilateral L5 pars interarticularis defects with 14 mm  anterolisthesis of L5 on S1. Complete L5-S1 disc space loss with associated facet hypertrophy. There is additional mild multilevel degenerative change in the spine. Left hip arthroplasty. Right hip osteoarthritis. IMPRESSION: 1. Patulous distal esophagus with wall thickening and intraluminal fluid, suggesting reflux or esophagitis. 2. Fluid-filled stomach with equivocal pre pyloric gastric wall thickening, can be seen with gastritis or peptic ulcer disease. 3. Moderate to large volume of colonic stool with stool distending the rectum, suggesting constipation. 4. Solitary right kidney with multiple cysts. There may be a diminutive markedly atrophic left kidney, but this is questionable. 5. Mild hepatic steatosis. 6. 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 Aortic Atherosclerosis (ICD10-I70.0). Electronically Signed   By: Keith Rake M.D.   On: 06/19/2020 23:45   DG Chest Port 1 View  Result Date: 06/19/2020 CLINICAL DATA:  Chest pain EXAM: PORTABLE CHEST 1 VIEW COMPARISON:  06/16/2020 FINDINGS: Low lung volumes with minimal atelectasis at the right base. No consolidation or effusion. Stable cardiomediastinal silhouette. No pneumothorax IMPRESSION: Low lung volumes with minimal right basilar atelectasis. Electronically Signed   By: Donavan Foil M.D.   On: 06/19/2020 22:15    Impression: 70 y.o. year old male with history of hypertension, hyperlipidemia, diabetes mellitus, CVA on plavix and aspirin 325mg , and cognitive impairment residing at [facility] who presented to the ED with a chief complaint of epigastric/substernal pain. Found to have lactic acid 2.5 on admission that has improved with IV fluids, AKI, hyponatremia, hypomagnesemia, hyperglycemia. CT abdomen/pelvis with dilated esophagus that is fluid-filled with diffuse wall thickening, fluid-filled stomach with equivocal prepyloric gastric wall thickening that can be seen with gastritis or PUD,  slight prominence of proximal pancreatic duct at 3 mm, colonic tortuosity with moderate volume of colonic stool with stool distending the rectum, hepatic steatosis, and solitary right kidney with multiple cysts. GI consulted due to CT esophageal and gastric abnormalities on CT.   Patient is a difficult historian. Reports 1 week of substernal CP, worsened with eating. ?possible dysphagia. Denies GERD, but history may not be reliable due to cognitive impairment. Aspirin listed on outpatient medications. No other NSAIDs listed.   Etiology of esophagitis is not clear. May be secondary to underlying GERD, pill induced esophagitis, and can't rule out malignancy. May also have esophageal ring or stricture.  Gastric wall thickening may be secondary to gastritis or PUD in the setting of daily aspirin without PPI, cannot rule out malignancy. He will need EGD +/- dilation for further evaluation. Due to ongoing hyperglycemia and electrolyte abnormalities, will hold off on EGD today. Consider EGD tomorrow. Will start Protonix 40 mg IV BID and clear liquids today.    Anemia: Chronic normocytic anemia with baseline hemoglobin in the 11-12 range.  Hemoglobin 12.2 on admission, down to 10.3 today.  No overt GI bleeding.  Rectal exam today with dark green stool noted.  He is on oral iron chronically.  No colonoscopy or EGD on file. Suspect decline in hemoglobin is likely hydration in the setting of IV fluids and likely dehydration on admission. Cannot rule out occult blood loss from upper GI tract as CT revealed esophageal wall  thickening and gastric wall thickening as per above.  Plans for possible EGD tomorrow. Will check fecal occult blood today.   Constipation: Moderate volume of colonic stool noted on CT. Rectal exam with soft stool in the rectal vault.  Patient denies abdominal pain and abdominal exam is benign.  Start MiraLAX 1 capful daily.   Pancreatic ductal dilation: Appears to be incidental finding on CT. Only  complaint is substernal pain as discussed above. No report of nausea/vomiting/abdominal pain. No LFT abnormalities. Bilirubin wnl. Abdominal exam is benign. Will arrange MRI/MRCP to evaluate further.   Plan: 1.  Clear liquid diet today.  2.  IV Protonix 40 mg BID.  3.  Possible EGD +/- dilation with propofol tomorrow with Dr. Gala Romney.  4.  MirALAX 17 g daily.  5.  Iron panel 6.  Vitamin B12 and Folate 7.  Check Hemoccult today. 8.  Monitor for overt GI bleeding. 9.  Will discontinue oral iron while inpatient.  10.  Follow H/H 11.  MRI/MRCP to evaluate pancreatic duct dilation.  12. Re-evaluate in the am.     LOS: 0 days    06/20/2020, 7:55 AM   Aliene Altes, PA-C [facility]

## 2020-06-20 NOTE — H&P (View-Only) (Signed)
@LOGO @   Referring Provider: Dr. Manuella Ghazi Primary Care Physician:  Leonie Douglas, MD Primary Gastroenterologist:  Dr. Laural Golden  Date of Admission: 06/19/2020 Date of Consultation: 06/20/2020  Reason for Consultation: Abnormal CT abdomen with gastritis/esophagitis  HPI:  Daniel [name] is a 70 y.o. year old male with history of hypertension, hyperlipidemia, diabetes mellitus, CVA, and cognitive impairment residing at [facility] who presented to the ED with a chief complaint of epigastric pain.    In the ED Temperature 97.6, heart rate 115, respiratory rate 19, blood pressure 120/69, satting at 98% Lactic acid 2.5 and then 2.4 WBC 13.3, hemoglobin 12.2, sodium 128, potassium 5.0, mag 1.2, creatinine 1.62, glucose 409, LFTs and T bili normal, lipase normal, troponins flat.  Blood cultures pending.  UA without evidence of UTI. Chest x-ray showed right basilar atelectasis. CT abdomen/pelvis shows a dilated esophagus that is fluid-filled with diffuse wall thickening, fluid-filled stomach with equivocal prepyloric gastric wall thickening that can be seen with gastritis or PUD, slight prominence of proximal pancreatic duct at 3 mm, colonic tortuosity with moderate volume of colonic stool with stool distending the rectum, hepatic steatosis, and solitary right kidney with multiple cysts.  He received 2 g of magnesium, 1 L of LR, 1 L of NaCl, and Protonix in the ED  He had clinical improvement in the ED of epigastric abdominal pain. Plans to admit for observation. GI consulted due to abdominal pain and abnormal CT findings.  Today: Reports low substernal chest pain that started last week occurring daily and worsened by meals. No relieving factors. Minimal pain at this time. Occasional sensation of foods getting hung in the lower esophagus. States he has vomited once but can't tell me when.  Denies recent vomiting.  No nausea at this time. No other abdominal pain. Denies GERD.  Denies constipation.   Cannot tell me how often he has a bowel movement.  Admits to having to strain at times to have a bowel movement.  No brbpr or melena.   No CP or shortness of breath. Denies other pain for which he has to take any pain medications.   Past Medical History:  Diagnosis Date  . Diabetes mellitus without complication ([place])   . High cholesterol   . Hypertension     Past Surgical History:  Procedure Laterality Date  . HIP SURGERY     left  . KIDNEY SURGERY      Prior to Admission medications   Medication Sig Start Date End Date Taking? Authorizing Provider  acetaminophen (TYLENOL) 650 MG CR tablet Take 650 mg by mouth 2 (two) times daily.    [provider]  amitriptyline (ELAVIL) 10 MG tablet Take 10 mg by mouth at bedtime. 04/30/20   [provider]  amLODipine (NORVASC) 5 MG tablet Take 1 tablet (5 mg total) by mouth daily. 06/16/20   Isla Pence, MD  aspirin EC 325 MG tablet Take 1 tablet (325 mg total) by mouth daily. 02/21/20   Evalee Jefferson, PA-C  atorvastatin (LIPITOR) 40 MG tablet Take 1 tablet (40 mg total) by mouth daily. 01/07/20 02/06/20  Darliss Cheney, MD  Cholecalciferol (VITAMIN D3) 2000 units capsule TAKE 1 CAPSULE BY MOUTH ONCE DAILY. **DO NOT CRUSH** Patient taking differently: Take 2,000 Units by mouth daily. 01/06/18   Fayrene Helper, MD  clopidogrel (PLAVIX) 75 MG tablet Take 75 mg by mouth daily.    [provider]  donepezil (ARICEPT) 10 MG tablet TAKE (1) TABLET BY MOUTH ONCE DAILY.  Patient taking differently: Take 10 mg by mouth at bedtime. 06/15/17   Raylene Everts, MD  FEROSUL 325 (65 Fe) MG tablet TAKE 1 TABLET BY MOUTH TWICE DAILY. Patient taking differently: Take 325 mg by mouth in the morning and at bedtime. 06/15/17   Raylene Everts, MD  FORA LANCETS MISC by Does not apply route. Test once daily    [provider]  gentian violet 1 % topical solution Apply 1 mL topically See admin instructions. Applied between toes  daily    [provider]  glucose blood test strip 1 each by Other route daily. Use as instructed    [provider]  magnesium oxide (MAG-OX) 400 MG tablet Take 1 tablet (400 mg total) by mouth daily. 06/08/20   Barton Dubois, MD  Magnesium Oxide (MAGNESIUM OXIDE 400) 240 MG PACK Take 1 capsule by mouth daily. 06/16/20   Isla Pence, MD  memantine (NAMENDA) 10 MG tablet Take 10 mg by mouth 2 (two) times daily. 06/07/20   [provider]  metFORMIN (GLUCOPHAGE) 1000 MG tablet Take 1 tablet (1,000 mg total) by mouth 2 (two) times daily with a meal. 06/08/20   Barton Dubois, MD  oxybutynin (DITROPAN-XL) 5 MG 24 hr tablet Take 5 mg by mouth daily. 01/04/20   [provider]  potassium chloride (KLOR-CON) 10 MEQ tablet Take 2 tablets (20 mEq total) by mouth daily. 06/08/20   Barton Dubois, MD  Semaglutide,0.25 or 0.5MG /DOS, (OZEMPIC, 0.25 OR 0.5 MG/DOSE,) 2 MG/1.5ML SOPN Inject 0.5 mg into the skin once a week. 06/08/20 09/06/20  Barton Dubois, MD  tamsulosin (FLOMAX) 0.4 MG CAPS capsule TAKE 1 CAPSULE BY MOUTH AT BEDTIME. Patient taking differently: Take 0.4 mg by mouth at bedtime. 06/15/17   Raylene Everts, MD  traZODone (DESYREL) 100 MG tablet Take 100 mg by mouth at bedtime. 01/04/20   [provider]  metoprolol tartrate (LOPRESSOR) 25 MG tablet Take 25 mg by mouth 2 (two) times daily. 05/09/20 06/16/20  [provider]  ramipril (ALTACE) 10 MG capsule Take 2 capsules (20 mg total) by mouth daily. 06/08/20 06/16/20  Barton Dubois, MD    Current Facility-Administered Medications  Medication Dose Route Frequency Provider Last Rate Last Admin  . 0.9 %  sodium chloride infusion   Intravenous Continuous Zierle-Ghosh, Asia B, DO 75 mL/hr at 06/20/20 0600 New Bag at 06/20/20 0600  . acetaminophen (TYLENOL) tablet 650 mg  650 mg Oral Q6H PRN Zierle-Ghosh, Asia B, DO       Or  . acetaminophen (TYLENOL) suppository 650 mg  650 mg Rectal Q6H PRN  Zierle-Ghosh, Asia B, DO      . alum & mag hydroxide-simeth (MAALOX/MYLANTA) 200-200-20 MG/5ML suspension 30 mL  30 mL Oral Q6H PRN Zierle-Ghosh, Asia B, DO      . amitriptyline (ELAVIL) tablet 10 mg  10 mg Oral QHS Zierle-Ghosh, Asia B, DO      . amLODipine (NORVASC) tablet 5 mg  5 mg Oral Daily Zierle-Ghosh, Asia B, DO      . aspirin EC tablet 325 mg  325 mg Oral Daily Zierle-Ghosh, Asia B, DO      . atorvastatin (LIPITOR) tablet 40 mg  40 mg Oral Daily Zierle-Ghosh, Asia B, DO      . clopidogrel (PLAVIX) tablet 75 mg  75 mg Oral Daily Zierle-Ghosh, Asia B, DO      . donepezil (ARICEPT) tablet 10 mg  10 mg Oral QHS Zierle-Ghosh, Asia B, DO      .  feeding supplement (ENSURE ENLIVE / ENSURE PLUS) liquid 237 mL  237 mL Oral BID BM Zierle-Ghosh, Asia B, DO      . ferrous sulfate tablet 325 mg  325 mg Oral Q breakfast Zierle-Ghosh, Asia B, DO      . heparin injection 5,000 Units  5,000 Units Subcutaneous Q8H Zierle-Ghosh, Asia B, DO   5,000 Units at 06/20/20 0559  . insulin aspart (novoLOG) injection 0-15 Units  0-15 Units Subcutaneous TID WC Zierle-Ghosh, Asia B, DO      . insulin aspart (novoLOG) injection 0-5 Units  0-5 Units Subcutaneous QHS Zierle-Ghosh, Asia B, DO      . magnesium sulfate IVPB 2 g 50 mL  2 g Intravenous Once Manuella Ghazi, Pratik D, DO      . memantine (NAMENDA) tablet 10 mg  10 mg Oral BID Zierle-Ghosh, Asia B, DO      . ondansetron (ZOFRAN) tablet 4 mg  4 mg Oral Q6H PRN Zierle-Ghosh, Asia B, DO       Or  . ondansetron (ZOFRAN) injection 4 mg  4 mg Intravenous Q6H PRN Zierle-Ghosh, Asia B, DO      . oxybutynin (DITROPAN-XL) 24 hr tablet 5 mg  5 mg Oral Daily Zierle-Ghosh, Asia B, DO      . pantoprazole (PROTONIX) injection 40 mg  40 mg Intravenous Q24H Shah, Pratik D, DO      . polyethylene glycol (MIRALAX / GLYCOLAX) packet 17 g  17 g Oral Daily PRN Zierle-Ghosh, Asia B, DO      . tamsulosin (FLOMAX) capsule 0.4 mg  0.4 mg Oral QHS Zierle-Ghosh, Asia B, DO      . traZODone  (DESYREL) tablet 100 mg  100 mg Oral QHS Zierle-Ghosh, Asia B, DO        Allergies as of 06/19/2020  . (No Known Allergies)    Family History  Family history unknown: Yes    Social History   Socioeconomic History  . Marital status: Single    Spouse name: Not on file  . Number of children: Not on file  . Years of education: Not on file  . Highest education level: Not on file  Occupational History  . Not on file  Tobacco Use  . Smoking status: Never Smoker  . Smokeless tobacco: Never Used  Vaping Use  . Vaping Use: Never used  Substance and Sexual Activity  . Alcohol use: No  . Drug use: No  . Sexual activity: Never  Other Topics Concern  . Not on file  Social History Narrative  . Not on file   Social Determinants of Health   Financial Resource Strain: Not on file  Food Insecurity: Not on file  Transportation Needs: Not on file  Physical Activity: Not on file  Stress: Not on file  Social Connections: Not on file  Intimate Partner Violence: Not on file    Review of Systems: Gen: Denies fever, chills, cold or flu like symptoms, lightheadedness, or dizziness.  CV: Denies chest pain or palpitations.  Resp: Denies shortness of breath or cough.  GI: See HPI MS: Denies joint pain Heme: Denies bruising or bleeding.   Physical Exam: Vital signs in last 24 hours: Temp:  [97.6 F (36.4 C)-98.7 F (37.1 C)] 98.7 F (37.1 C) (01/12 0449) Pulse Rate:  [99-125] 100 (01/12 0449) Resp:  [18-20] 19 (01/12 0449) BP: (105-126)/(66-82) 112/74 (01/12 0449) SpO2:  [82 %-98 %] 97 % (01/12 0449)   General:   Alert and oriented to self  and situation,  well-developed, well-nourished, pleasant and cooperative in NAD Head:  Normocephalic and atraumatic. Eyes:  Sclera clear, no icterus. Conjunctiva pink. Ears:  Normal auditory acuity. Lungs:  Clear throughout to auscultation.   No wheezes, crackles, or rhonchi. No acute distress. Heart:  Regular rhythm. Tachycardic with HR 115.  No murmurs, clicks, rubs,  or gallops. Abdomen:  Soft, nontender and nondistended. No masses, hepatosplenomegaly or hernias noted. Normal bowel sounds, without guarding, and without rebound.   Rectal:  No external lesions. Internal exam with soft stool in the rectal vault. No appreciable masses. Gloved exam finger with dark green stool, no bright red blood.  Msk: Symmetrical without gross deformities. Normal posture. Extremities:  Without edema. Neurologic:  Alert and  Oriented to self and situation.  Skin:  Intact without significant lesions or rashes. Psych:  Flat affect.   Intake/Output from previous day: 01/11 0701 - 01/12 0700 In: 47.2 [IV Piggyback:47.2] Out: -  Intake/Output this shift: No intake/output data recorded.  Lab Results: Recent Labs    06/19/20 2032 06/20/20 0533  WBC 13.3* 10.9*  HGB 12.2* 10.3*  HCT 37.7* 31.4*  PLT 406* 367   BMET Recent Labs    06/19/20 2032 06/20/20 0533  NA 128* 128*  K 5.0 4.8  CL 93* 96*  CO2 24 25  GLUCOSE 409* 379*  BUN 37* 33*  CREATININE 1.62* 1.41*  CALCIUM 10.0 9.3   LFT Recent Labs    06/19/20 2032 06/20/20 0533  PROT 5.1* 4.2*  ALBUMIN 2.5* 2.0*  AST 13* 10*  ALT 22 21  ALKPHOS 53 40  BILITOT 0.5 0.6   Studies/Results: CT Abdomen Pelvis W Contrast  Result Date: 06/19/2020 CLINICAL DATA:  Epigastric pain. EXAM: CT ABDOMEN AND PELVIS WITH CONTRAST TECHNIQUE: Multidetector CT imaging of the abdomen and pelvis was performed using the standard protocol following bolus administration of intravenous contrast. CONTRAST:  119mL OMNIPAQUE IOHEXOL 300 MG/ML  SOLN COMPARISON:  None. FINDINGS: Lower chest: Right basilar opacity slightly greater than typically seen with atelectasis. Small right pleural effusion. Distal esophagus is dilated, fluid-filled, with diffuse wall thickening. Hepatobiliary: Motion artifact through the liver and gallbladder. Allowing for this, no focal liver abnormality is seen. Mild diffusely  decreased hepatic density typical of steatosis. No abnormal gallbladder distention or calcified gallstone. There is no biliary dilatation. Pancreas: Pancreas appears slightly truncated. Slight prominence of the proximal pancreatic duct at 3 mm. No peripancreatic fat stranding. Spleen: Normal in size without focal abnormality. Adrenals/Urinary Tract: No adrenal nodule. No normal left is seen. There may be a diminutive markedly atrophic kidney seen in the left renal fossa, series 2, image 30. There is no right hydronephrosis. Multiple cysts as well as low-density lesions that are too small to characterize throughout the right renal parenchyma. There is no right perinephric edema. No visualized renal calculi. Mild bladder distension without wall thickening. Stomach/Bowel: Patulous distal esophagus with wall thickening and intraluminal fluid. There is mild wall thickening at the gastroesophageal junction. Stomach is distended with intraluminal fluid. Equivocal pre pyloric gastric wall thickening. Normal positioning of the duodenum and ligament of Treitz. There is no small bowel obstruction or inflammatory change. High-riding cecum in the right upper quadrant. The appendix is tentatively visualized, series 2, image 33, likely normal but partially obscured by motion. No periappendiceal fat stranding. Colonic tortuosity with moderate volume of colonic stool. Moderate to large stool primarily in the left colon with stool distending the rectum, rectal distention of 7 cm. No colonic or rectal wall  thickening. Vascular/Lymphatic: Tortuous abdominal aorta with mild atherosclerosis. Patent portal vein. No acute vascular findings. No enlarged lymph nodes in the abdomen or pelvis. Reproductive: Prostate grossly normal, streak artifact from left hip arthroplasty obscures assessment. Other: No ascites or free air. Small fat containing umbilical hernia. Musculoskeletal: Bilateral L5 pars interarticularis defects with 14 mm  anterolisthesis of L5 on S1. Complete L5-S1 disc space loss with associated facet hypertrophy. There is additional mild multilevel degenerative change in the spine. Left hip arthroplasty. Right hip osteoarthritis. IMPRESSION: 1. Patulous distal esophagus with wall thickening and intraluminal fluid, suggesting reflux or esophagitis. 2. Fluid-filled stomach with equivocal pre pyloric gastric wall thickening, can be seen with gastritis or peptic ulcer disease. 3. Moderate to large volume of colonic stool with stool distending the rectum, suggesting constipation. 4. Solitary right kidney with multiple cysts. There may be a diminutive markedly atrophic left kidney, but this is questionable. 5. Mild hepatic steatosis. 6. 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 Aortic Atherosclerosis (ICD10-I70.0). Electronically Signed   By: Keith Rake M.D.   On: 06/19/2020 23:45   DG Chest Port 1 View  Result Date: 06/19/2020 CLINICAL DATA:  Chest pain EXAM: PORTABLE CHEST 1 VIEW COMPARISON:  06/16/2020 FINDINGS: Low lung volumes with minimal atelectasis at the right base. No consolidation or effusion. Stable cardiomediastinal silhouette. No pneumothorax IMPRESSION: Low lung volumes with minimal right basilar atelectasis. Electronically Signed   By: Donavan Foil M.D.   On: 06/19/2020 22:15    Impression: 70 y.o. year old male with history of hypertension, hyperlipidemia, diabetes mellitus, CVA on plavix and aspirin 325mg , and cognitive impairment residing at [facility] who presented to the ED with a chief complaint of epigastric/substernal pain. Found to have lactic acid 2.5 on admission that has improved with IV fluids, AKI, hyponatremia, hypomagnesemia, hyperglycemia. CT abdomen/pelvis with dilated esophagus that is fluid-filled with diffuse wall thickening, fluid-filled stomach with equivocal prepyloric gastric wall thickening that can be seen with gastritis or PUD,  slight prominence of proximal pancreatic duct at 3 mm, colonic tortuosity with moderate volume of colonic stool with stool distending the rectum, hepatic steatosis, and solitary right kidney with multiple cysts. GI consulted due to CT esophageal and gastric abnormalities on CT.   Patient is a difficult historian. Reports 1 week of substernal CP, worsened with eating. ?possible dysphagia. Denies GERD, but history may not be reliable due to cognitive impairment. Aspirin listed on outpatient medications. No other NSAIDs listed.   Etiology of esophagitis is not clear. May be secondary to underlying GERD, pill induced esophagitis, and can't rule out malignancy. May also have esophageal ring or stricture.  Gastric wall thickening may be secondary to gastritis or PUD in the setting of daily aspirin without PPI, cannot rule out malignancy. He will need EGD +/- dilation for further evaluation. Due to ongoing hyperglycemia and electrolyte abnormalities, will hold off on EGD today. Consider EGD tomorrow. Will start Protonix 40 mg IV BID and clear liquids today.    Anemia: Chronic normocytic anemia with baseline hemoglobin in the 11-12 range.  Hemoglobin 12.2 on admission, down to 10.3 today.  No overt GI bleeding.  Rectal exam today with dark green stool noted.  He is on oral iron chronically.  No colonoscopy or EGD on file. Suspect decline in hemoglobin is likely hydration in the setting of IV fluids and likely dehydration on admission. Cannot rule out occult blood loss from upper GI tract as CT revealed esophageal wall  thickening and gastric wall thickening as per above.  Plans for possible EGD tomorrow. Will check fecal occult blood today.   Constipation: Moderate volume of colonic stool noted on CT. Rectal exam with soft stool in the rectal vault.  Patient denies abdominal pain and abdominal exam is benign.  Start MiraLAX 1 capful daily.   Pancreatic ductal dilation: Appears to be incidental finding on CT. Only  complaint is substernal pain as discussed above. No report of nausea/vomiting/abdominal pain. No LFT abnormalities. Bilirubin wnl. Abdominal exam is benign. Will arrange MRI/MRCP to evaluate further.   Plan: 1.  Clear liquid diet today.  2.  IV Protonix 40 mg BID.  3.  Possible EGD +/- dilation with propofol tomorrow with Dr. Gala Romney.  4.  MirALAX 17 g daily.  5.  Iron panel 6.  Vitamin B12 and Folate 7.  Check Hemoccult today. 8.  Monitor for overt GI bleeding. 9.  Will discontinue oral iron while inpatient.  10.  Follow H/H 11.  MRI/MRCP to evaluate pancreatic duct dilation.  12. Re-evaluate in the am.     LOS: 0 days    06/20/2020, 7:55 AM   Aliene Altes, PA-C [facility]

## 2020-06-20 NOTE — Progress Notes (Signed)
   06/20/20 1033  Assess: MEWS Score  Temp 98.4 F (36.9 C)  BP 113/73  Pulse Rate (!) 115  Resp 17  SpO2 96 %  Assess: MEWS Score  MEWS Temp 0  MEWS Systolic 0  MEWS Pulse 2  MEWS RR 0  MEWS LOC 0  MEWS Score 2  MEWS Score Color Yellow  Assess: if the MEWS score is Yellow or Red  Were vital signs taken at a resting state? Yes  Focused Assessment No change from prior assessment  Early Detection of Sepsis Score *See Row Information* Low  MEWS guidelines implemented *See Row Information* Yes  Treat  MEWS Interventions Escalated (See documentation below)  Pain Scale 0-10  Pain Score 0  Take Vital Signs  Increase Vital Sign Frequency  Yellow: Q 2hr X 2 then Q 4hr X 2, if remains yellow, continue Q 4hrs  Escalate  MEWS: Escalate Yellow: discuss with charge nurse/RN and consider discussing with provider and RRT  Notify: Charge Nurse/RN  Name of Charge Nurse/RN Notified Cydney Ok RN  Date Charge Nurse/RN Notified 06/20/20  Time Charge Nurse/RN Notified 1040

## 2020-06-20 NOTE — Progress Notes (Signed)
Daniel [name]  is a 70 y.o. male, with history of hypertension, hyperlipidemia, diabetes mellitus presents to the ED with a chief complaint of epigastric pain.  Patient is able to point on his abdomen to tell me where the pain is.  He has had multiple ED visits and hospital admissions in the last 3 weeks for different issues.  He had a very abnormal abdominal CT scan with dilated esophagus that is fluid-filled with diffuse wall thickening as well as signs of gastritis or peptic ulcer disease noted.  GI consulted with plans to keep on clear liquid diet today and plan for possible EGD in a.m to further evaluate the findings.  Patient seen and evaluated at bedside.  He has been started on PPI twice daily as well as MiraLAX daily.  His amlodipine has been discontinued and he has been started on metoprolol twice daily to help better control his heart rate and manage his blood pressures.  Plan to follow-up on labs.  Discussed with guardian on phone 1/12.  Total care time: 30 minutes.

## 2020-06-20 NOTE — Progress Notes (Signed)
Inpatient Diabetes Program Recommendations  AACE/ADA: New Consensus Statement on Inpatient Glycemic Control (2015)  Target Ranges:  Prepandial:   less than 140 mg/dL      Peak postprandial:   less than 180 mg/dL (1-2 hours)      Critically ill patients:  140 - 180 mg/dL   Lab Results  Component Value Date   GLUCAP 332 (H) 06/20/2020   HGBA1C 9.8 (H) 06/06/2020    Review of Glycemic Control Results for EMAD, BRECHTEL (MRN [id number]) as of 06/20/2020 08:46  Ref. Range 06/20/2020 08:20  Glucose-Capillary Latest Ref Range: 70 - 99 mg/dL 332 (H)   Diabetes history: Type 2 DM Outpatient Diabetes medications: Ozempic 0.5 mg qwk, Metformin 1000 mg BID Current orders for Inpatient glycemic control: Novolog 0-15 units TID, Novolog 0-5 units QHS  Inpatient Diabetes Program Recommendations:    Consider adding Levemir 12 units QD.   Patient could benefit from outpatient insulin, however, limited by cognitive impairment.  Following.   Thanks, Bronson Curb, MSN, RNC-OB Diabetes Coordinator [phone] (8a-5p)

## 2020-06-20 NOTE — ED Provider Notes (Signed)
Care assumed from Dr. Melina Copa, patient with upper abdominal pain/chest pain pending CT abdomen and pelvis, second troponin, and repeat lactic acid.  Concerns were persistent tachycardia and elevated lactic acid level.  Repeat troponin was normal but lactic acid level did not improve with IV hydration.  CT abdomen and pelvis was consistent with esophagitis and possible gastritis.  It is not clear why lactic acid level is elevated, but it is certainly concerning that it is not responding to IV fluids and tachycardia is not responding to IV fluids.  He will be given additional fluids.  Given findings suggestive of gastritis and esophagitis, will start on pantoprazole.  Case is discussed with Dr. Clearence Ped of [facility], who agrees to admit the patient.  Results for orders placed or performed during the hospital encounter of 06/19/20  Culture, blood (routine x 2)   Specimen: BLOOD LEFT ARM  Result Value Ref Range   Specimen Description BLOOD LEFT ARM    Special Requests      BOTTLES DRAWN AEROBIC AND ANAEROBIC Blood Culture adequate volume Performed at [facility], [address], [place], [place] [address]    Culture PENDING    Report Status PENDING   Lipase, blood  Result Value Ref Range   Lipase 32 11 - 51 U/L  Comprehensive metabolic panel  Result Value Ref Range   Sodium 128 (L) 135 - 145 mmol/L   Potassium 5.0 3.5 - 5.1 mmol/L   Chloride 93 (L) 98 - 111 mmol/L   CO2 24 22 - 32 mmol/L   Glucose, Bld 409 (H) 70 - 99 mg/dL   BUN 37 (H) 8 - 23 mg/dL   Creatinine, Ser 1.62 (H) 0.61 - 1.24 mg/dL   Calcium 10.0 8.9 - 10.3 mg/dL   Total Protein 5.1 (L) 6.5 - 8.1 g/dL   Albumin 2.5 (L) 3.5 - 5.0 g/dL   AST 13 (L) 15 - 41 U/L   ALT 22 0 - 44 U/L   Alkaline Phosphatase 53 38 - 126 U/L   Total Bilirubin 0.5 0.3 - 1.2 mg/dL   GFR, Estimated 46 (L) >60 mL/min   Anion gap 11 5 - 15  CBC  Result Value Ref Range   WBC 13.3 (H) 4.0 - 10.5 K/uL   RBC 4.20 (L) 4.22 - 5.81 MIL/uL    Hemoglobin 12.2 (L) 13.0 - 17.0 g/dL   HCT 37.7 (L) 39.0 - 52.0 %   MCV 89.8 80.0 - 100.0 fL   MCH 29.0 26.0 - 34.0 pg   MCHC 32.4 30.0 - 36.0 g/dL   RDW 15.0 11.5 - 15.5 %   Platelets 406 (H) 150 - 400 K/uL   nRBC 0.0 0.0 - 0.2 %  Lactic acid, plasma  Result Value Ref Range   Lactic Acid, Venous 2.5 (HH) 0.5 - 1.9 mmol/L  Lactic acid, plasma  Result Value Ref Range   Lactic Acid, Venous 2.4 (HH) 0.5 - 1.9 mmol/L  Magnesium  Result Value Ref Range   Magnesium 1.2 (L) 1.7 - 2.4 mg/dL  Troponin I (High Sensitivity)  Result Value Ref Range   Troponin I (High Sensitivity) 8 <18 ng/L  Troponin I (High Sensitivity)  Result Value Ref Range   Troponin I (High Sensitivity) 8 <18 ng/L   DG Chest 2 View  Result Date: 06/09/2020 CLINICAL DATA:  70 year old male with history of intermittent chest pain. EXAM: CHEST - 2 VIEW COMPARISON:  No priors. FINDINGS: Lung volumes are low. No consolidative airspace disease. No pleural effusions.  No pneumothorax. No pulmonary nodule or mass noted. Pulmonary vasculature is normal. The patient is rotated to the right on today's exam, resulting in distortion of the mediastinal contours and reduced diagnostic sensitivity and specificity for mediastinal pathology. IMPRESSION: 1. Low lung volumes without radiographic evidence of acute cardiopulmonary disease. Electronically Signed   By: Vinnie Langton M.D.   On: 06/09/2020 14:51   CT Abdomen Pelvis W Contrast  Result Date: 06/19/2020 CLINICAL DATA:  Epigastric pain. EXAM: CT ABDOMEN AND PELVIS WITH CONTRAST TECHNIQUE: Multidetector CT imaging of the abdomen and pelvis was performed using the standard protocol following bolus administration of intravenous contrast. CONTRAST:  124mL OMNIPAQUE IOHEXOL 300 MG/ML  SOLN COMPARISON:  None. FINDINGS: Lower chest: Right basilar opacity slightly greater than typically seen with atelectasis. Small right pleural effusion. Distal esophagus is dilated, fluid-filled, with diffuse  wall thickening. Hepatobiliary: Motion artifact through the liver and gallbladder. Allowing for this, no focal liver abnormality is seen. Mild diffusely decreased hepatic density typical of steatosis. No abnormal gallbladder distention or calcified gallstone. There is no biliary dilatation. Pancreas: Pancreas appears slightly truncated. Slight prominence of the proximal pancreatic duct at 3 mm. No peripancreatic fat stranding. Spleen: Normal in size without focal abnormality. Adrenals/Urinary Tract: No adrenal nodule. No normal left is seen. There may be a diminutive markedly atrophic kidney seen in the left renal fossa, series 2, image 30. There is no right hydronephrosis. Multiple cysts as well as low-density lesions that are too small to characterize throughout the right renal parenchyma. There is no right perinephric edema. No visualized renal calculi. Mild bladder distension without wall thickening. Stomach/Bowel: Patulous distal esophagus with wall thickening and intraluminal fluid. There is mild wall thickening at the gastroesophageal junction. Stomach is distended with intraluminal fluid. Equivocal pre pyloric gastric wall thickening. Normal positioning of the duodenum and ligament of Treitz. There is no small bowel obstruction or inflammatory change. High-riding cecum in the right upper quadrant. The appendix is tentatively visualized, series 2, image 33, likely normal but partially obscured by motion. No periappendiceal fat stranding. Colonic tortuosity with moderate volume of colonic stool. Moderate to large stool primarily in the left colon with stool distending the rectum, rectal distention of 7 cm. No colonic or rectal wall thickening. Vascular/Lymphatic: Tortuous abdominal aorta with mild atherosclerosis. Patent portal vein. No acute vascular findings. No enlarged lymph nodes in the abdomen or pelvis. Reproductive: Prostate grossly normal, streak artifact from left hip arthroplasty obscures  assessment. Other: No ascites or free air. Small fat containing umbilical hernia. 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 Complete L5-S1 disc space loss with associated facet hypertrophy. There is additional mild multilevel degenerative change in the spine. Left hip arthroplasty. Right hip osteoarthritis. IMPRESSION: 1. Patulous distal esophagus with wall thickening and intraluminal fluid, suggesting reflux or esophagitis. 2. Fluid-filled stomach with equivocal pre pyloric gastric wall thickening, can be seen with gastritis or peptic ulcer disease. 3. Moderate to large volume of colonic stool with stool distending the rectum, suggesting constipation. 4. Solitary right kidney with multiple cysts. There may be a diminutive markedly atrophic left kidney, but this is questionable. 5. Mild hepatic steatosis. 6. 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 Aortic Atherosclerosis (ICD10-I70.0). Electronically Signed   By: Keith Rake M.D.   On: 06/19/2020 23:45   DG Chest Port 1 View  Result Date: 06/19/2020 CLINICAL DATA:  Chest pain EXAM: PORTABLE CHEST 1 VIEW COMPARISON:  06/16/2020  FINDINGS: Low lung volumes with minimal atelectasis at the right base. No consolidation or effusion. Stable cardiomediastinal silhouette. No pneumothorax IMPRESSION: Low lung volumes with minimal right basilar atelectasis. Electronically Signed   By: Donavan Foil M.D.   On: 06/19/2020 22:15   DG Chest Port 1 View  Result Date: 06/16/2020 CLINICAL DATA:  70 year old male with hypotension. EXAM: PORTABLE CHEST 1 VIEW COMPARISON:  06/09/2020 FINDINGS: The heart size and mediastinal contours are within normal limits. Similar appearing low lung volumes. Mild bibasilar subsegmental atelectasis. The visualized skeletal structures are unremarkable. IMPRESSION: Similar appearing low lung volumes with mild bibasilar subsegmental atelectasis.  Electronically Signed   By: Ruthann Cancer MD   On: 06/16/2020 11:41   Images viewed by me.    Delora Fuel, MD 83/38/25 [id number]

## 2020-06-20 NOTE — H&P (Signed)
TRH H&P    Patient Demographics:    Daniel [name], is a 70 y.o. male  MRN: [id number]  DOB - Jan 06, 1951  Admit Date - 06/19/2020  Referring MD/NP/PA: glick  Outpatient Primary MD for the patient is Leonie Douglas, MD  Patient coming from: home  Chief complaint-abdominal pain   HPI:    Daniel [name]  is a 70 y.o. male, with history of hypertension, hyperlipidemia, diabetes mellitus presents to the ED with a chief complaint of epigastric pain.  Patient is able to point on his abdomen to tell me where the pain is.  He is not sure how to characterize it but describes it as a hurt.  He is not sure how long its been there.  He does note that is completely better now.  He does not know what made it better. Patient does have a history of cognitive impairment.  Patient has denied any vomiting or diarrhea.  He denies fever and dysuria as well.  Patient denies chest pain at this time.  Unfortunately patient is not able to provide more history than this due to cognitive impairment.  This is patient's third visit to the ER so far this year.  Keeping him from for observation to see if he has an episode of this abdominal pain so we can get a better idea of what it is.  In the ED Temperature 97.6, heart rate 115, respiratory rate 19, blood pressure 120/69, satting at 98% Lactic acid 2.5 and then 2.4 CT abdomen shows esophagitis, gastritis, constipation, solitary right kidney, hepatic steatosis, pars interarticularis. Blood cultures pending Chest x-ray showed right basilar atelectasis Troponins were flat at 8 and 8 Leukocytosis of 13.3 Hyponatremia 128 hypomagnesia at 1.2, AKI with creatinine 1.62, hyperglycemia of 409 2 g of magnesium, 1 L of LR, 1 L of NaCl, Protonix given in the ED   Review of systems:    The accuracy of these review of systems is questionable as patient becomes confused with the questions, but the  answers are listed below:  In addition to the HPI above,  No Fever-chills, No Headache, No changes with Vision or hearing, No problems swallowing food or Liquids, No Chest pain, Cough or Shortness of Breath, Admits to abdominal pain, No Nausea or Vomiting, bowel movements are regular, No Blood in stool or Urine, No new skin rashes or bruises, No new joints pains-aches,  No new weakness, tingling, numbness in any extremity,, No significant Mental Stressors.  All other systems reviewed and are negative.    Past History of the following :    Past Medical History:  Diagnosis Date  . Diabetes mellitus without complication ([place])   . High cholesterol   . Hypertension       Past Surgical History:  Procedure Laterality Date  . HIP SURGERY     left  . KIDNEY SURGERY        Social History:      Social History   Tobacco Use  . Smoking status: Never Smoker  . Smokeless tobacco: Never Used  Substance  Use Topics  . Alcohol use: No       Family History :     Family History  Family history unknown: Yes      Home Medications:   Prior to Admission medications   Medication Sig Start Date End Date Taking? Authorizing Provider  acetaminophen (TYLENOL) 650 MG CR tablet Take 650 mg by mouth 2 (two) times daily.    [provider]  amitriptyline (ELAVIL) 10 MG tablet Take 10 mg by mouth at bedtime. 04/30/20   [provider]  amLODipine (NORVASC) 5 MG tablet Take 1 tablet (5 mg total) by mouth daily. 06/16/20   Isla Pence, MD  aspirin EC 325 MG tablet Take 1 tablet (325 mg total) by mouth daily. 02/21/20   Evalee Jefferson, PA-C  atorvastatin (LIPITOR) 40 MG tablet Take 1 tablet (40 mg total) by mouth daily. 01/07/20 02/06/20  Darliss Cheney, MD  Cholecalciferol (VITAMIN D3) 2000 units capsule TAKE 1 CAPSULE BY MOUTH ONCE DAILY. **DO NOT CRUSH** Patient taking differently: Take 2,000 Units by mouth daily. 01/06/18   Fayrene Helper, MD  clopidogrel (PLAVIX)  75 MG tablet Take 75 mg by mouth daily.    [provider]  donepezil (ARICEPT) 10 MG tablet TAKE (1) TABLET BY MOUTH ONCE DAILY. Patient taking differently: Take 10 mg by mouth at bedtime. 06/15/17   Raylene Everts, MD  FEROSUL 325 (65 Fe) MG tablet TAKE 1 TABLET BY MOUTH TWICE DAILY. Patient taking differently: Take 325 mg by mouth in the morning and at bedtime. 06/15/17   Raylene Everts, MD  FORA LANCETS MISC by Does not apply route. Test once daily    [provider]  gentian violet 1 % topical solution Apply 1 mL topically See admin instructions. Applied between toes daily    [provider]  glucose blood test strip 1 each by Other route daily. Use as instructed    [provider]  magnesium oxide (MAG-OX) 400 MG tablet Take 1 tablet (400 mg total) by mouth daily. 06/08/20   Barton Dubois, MD  Magnesium Oxide (MAGNESIUM OXIDE 400) 240 MG PACK Take 1 capsule by mouth daily. 06/16/20   Isla Pence, MD  memantine (NAMENDA) 10 MG tablet Take 10 mg by mouth 2 (two) times daily. 06/07/20   [provider]  metFORMIN (GLUCOPHAGE) 1000 MG tablet Take 1 tablet (1,000 mg total) by mouth 2 (two) times daily with a meal. 06/08/20   Barton Dubois, MD  oxybutynin (DITROPAN-XL) 5 MG 24 hr tablet Take 5 mg by mouth daily. 01/04/20   [provider]  potassium chloride (KLOR-CON) 10 MEQ tablet Take 2 tablets (20 mEq total) by mouth daily. 06/08/20   Barton Dubois, MD  Semaglutide,0.25 or 0.5MG /DOS, (OZEMPIC, 0.25 OR 0.5 MG/DOSE,) 2 MG/1.5ML SOPN Inject 0.5 mg into the skin once a week. 06/08/20 09/06/20  Barton Dubois, MD  tamsulosin (FLOMAX) 0.4 MG CAPS capsule TAKE 1 CAPSULE BY MOUTH AT BEDTIME. Patient taking differently: Take 0.4 mg by mouth at bedtime. 06/15/17   Raylene Everts, MD  traZODone (DESYREL) 100 MG tablet Take 100 mg by mouth at bedtime. 01/04/20   [provider]  metoprolol tartrate (LOPRESSOR) 25 MG tablet Take 25 mg by  mouth 2 (two) times daily. 05/09/20 06/16/20  [provider]  ramipril (ALTACE) 10 MG capsule Take 2 capsules (20 mg total) by mouth daily. 06/08/20 06/16/20  Barton Dubois, MD     Allergies:    No Known Allergies  Physical Exam:   Vitals  Blood pressure 112/74, pulse 100, temperature 98.7 F (37.1 C), temperature source Oral, resp. rate 19, SpO2 97 %.  1.  General: Lying supine in bed in no acute distress  2. Psychiatric: Flat affect, cooperative, pleasant  3. Neurologic: Moves all 4 extremities voluntarily, equal sensation in the upper and lower extremities bilaterally, oriented to self and place but not to year  4. HEENMT:  Head is atraumatic, normocephalic, pupils reactive to light, neck is supple, trachea is midline, mucous membranes are moist after 2 L of fluid  5. Respiratory : Lungs are clear to auscultation bilaterally  6. Cardiovascular : Heart rate is tachycardic, rhythm is regular, S1-S2 auscultated, no murmur, no peripheral edema  7. Gastrointestinal:  Abdomen is soft, nondistended, nontender to palpation  8. Skin:  No acute lesions on limited skin exam  9.Musculoskeletal:  No calf tenderness, acute deformity, peripheral edema    Data Review:    CBC Recent Labs  Lab 06/16/20 1124 06/19/20 2032  WBC 13.2* 13.3*  HGB 12.1* 12.2*  HCT 37.2* 37.7*  PLT 375 406*  MCV 87.9 89.8  MCH 28.6 29.0  MCHC 32.5 32.4  RDW 14.4 15.0   ------------------------------------------------------------------------------------------------------------------  Results for orders placed or performed during the hospital encounter of 06/19/20 (from the past 48 hour(s))  Lipase, blood     Status: None   Collection Time: 06/19/20  8:32 PM  Result Value Ref Range   Lipase 32 11 - 51 U/L    Comment: Performed at [facility], [address] [place], [place] [address]  Comprehensive metabolic panel     Status: Abnormal   Collection Time: 06/19/20  8:32 PM  Result  Value Ref Range   Sodium 128 (L) 135 - 145 mmol/L   Potassium 5.0 3.5 - 5.1 mmol/L   Chloride 93 (L) 98 - 111 mmol/L   CO2 24 22 - 32 mmol/L   Glucose, Bld 409 (H) 70 - 99 mg/dL    Comment: Glucose reference range applies only to samples taken after fasting for at least 8 hours.   BUN 37 (H) 8 - 23 mg/dL   Creatinine, Ser 1.62 (H) 0.61 - 1.24 mg/dL   Calcium 10.0 8.9 - 10.3 mg/dL   Total Protein 5.1 (L) 6.5 - 8.1 g/dL   Albumin 2.5 (L) 3.5 - 5.0 g/dL   AST 13 (L) 15 - 41 U/L   ALT 22 0 - 44 U/L   Alkaline Phosphatase 53 38 - 126 U/L   Total Bilirubin 0.5 0.3 - 1.2 mg/dL   GFR, Estimated 46 (L) >60 mL/min    Comment: (NOTE) Calculated using the CKD-EPI Creatinine Equation (2021)    Anion gap 11 5 - 15    Comment: Performed at [facility], [address] [place], [place] [address]  CBC     Status: Abnormal   Collection Time: 06/19/20  8:32 PM  Result Value Ref Range   WBC 13.3 (H) 4.0 - 10.5 K/uL   RBC 4.20 (L) 4.22 - 5.81 MIL/uL   Hemoglobin 12.2 (L) 13.0 - 17.0 g/dL   HCT 37.7 (L) 39.0 - 52.0 %   MCV 89.8 80.0 - 100.0 fL   MCH 29.0 26.0 - 34.0 pg   MCHC 32.4 30.0 - 36.0 g/dL   RDW 15.0 11.5 - 15.5 %   Platelets 406 (H) 150 - 400 K/uL   nRBC 0.0 0.0 - 0.2 %    Comment: Performed at [facility], [address], [place], [place] [address]  Magnesium     Status: Abnormal   Collection Time: 06/19/20  8:32 PM  Result Value Ref Range   Magnesium 1.2 (L) 1.7 - 2.4 mg/dL    Comment: Performed at [facility], [address], [place], [place] [address]  Troponin I (High Sensitivity)     Status: None   Collection Time: 06/19/20  8:32 PM  Result Value Ref Range   Troponin I (High Sensitivity) 8 <18 ng/L    Comment: (NOTE) Elevated high sensitivity troponin I (hsTnI) values and significant  changes across serial measurements may suggest ACS but many other  chronic and acute conditions are known to elevate hsTnI results.  Refer to the "Links" section for chest pain algorithms and  additional  guidance. Performed at [facility], [address] [place], [place] [address]   Lactic acid, plasma     Status: Abnormal   Collection Time: 06/19/20  8:43 PM  Result Value Ref Range   Lactic Acid, Venous 2.5 (HH) 0.5 - 1.9 mmol/L    Comment: CRITICAL RESULT CALLED TO, READ BACK BY AND VERIFIED WITH: BELTON,C ON 06/19/20 AT 2135 BY LOY,C Performed at [facility], [address], [place], [place] [address]   Lactic acid, plasma     Status: Abnormal   Collection Time: 06/19/20 10:48 PM  Result Value Ref Range   Lactic Acid, Venous 2.4 (HH) 0.5 - 1.9 mmol/L    Comment: CRITICAL VALUE NOTED.  VALUE IS CONSISTENT WITH PREVIOUSLY REPORTED AND CALLED VALUE. Performed at [facility], [address], [place], [place] [address]   Troponin I (High Sensitivity)     Status: None   Collection Time: 06/19/20 10:48 PM  Result Value Ref Range   Troponin I (High Sensitivity) 8 <18 ng/L    Comment: (NOTE) Elevated high sensitivity troponin I (hsTnI) values and significant  changes across serial measurements may suggest ACS but many other  chronic and acute conditions are known to elevate hsTnI results.  Refer to the "Links" section for chest pain algorithms and additional  guidance. Performed at [facility], [address], [place], [place] [address]   Culture, blood (routine x 2)     Status: None (Preliminary result)   Collection Time: 06/19/20 10:48 PM   Specimen: BLOOD RIGHT HAND  Result Value Ref Range   Specimen Description BLOOD RIGHT HAND    Special Requests      BOTTLES DRAWN AEROBIC AND ANAEROBIC Blood Culture adequate volume   Culture      NO GROWTH < 12 HOURS Performed at [facility], [address], [place], [place] [address]    Report Status PENDING   Culture, blood (routine x 2)     Status: None (Preliminary result)   Collection Time: 06/19/20 10:58 PM   Specimen: BLOOD LEFT ARM  Result Value Ref Range   Specimen Description BLOOD LEFT ARM    Special Requests       BOTTLES DRAWN AEROBIC AND ANAEROBIC Blood Culture adequate volume   Culture      NO GROWTH < 12 HOURS Performed at [facility], [address] [place], [place] [address]    Report Status PENDING   Urinalysis, Routine w reflex microscopic Urine, Catheterized     Status: Abnormal   Collection Time: 06/20/20  2:00 AM  Result Value Ref Range   Color, Urine YELLOW YELLOW   APPearance CLEAR CLEAR   Specific Gravity, Urine 1.030 1.005 - 1.030   pH 5.0 5.0 -  8.0   Glucose, UA >=500 (A) NEGATIVE mg/dL   Hgb urine dipstick NEGATIVE NEGATIVE   Bilirubin Urine NEGATIVE NEGATIVE   Ketones, ur 5 (A) NEGATIVE mg/dL   Protein, ur NEGATIVE NEGATIVE mg/dL   Nitrite NEGATIVE NEGATIVE   Leukocytes,Ua NEGATIVE NEGATIVE   WBC, UA 0-5 0 - 5 WBC/hpf   Bacteria, UA NONE SEEN NONE SEEN    Comment: Performed at [facility], [address], [place], [place] [address]    Chemistries  Recent Labs  Lab 06/16/20 1124 06/19/20 2032  NA 127* 128*  K 4.8 5.0  CL 91* 93*  CO2 24 24  GLUCOSE 269* 409*  BUN 36* 37*  CREATININE 1.45* 1.62*  CALCIUM 9.2 10.0  MG 0.9* 1.2*  AST 12* 13*  ALT 31 22  ALKPHOS 52 53  BILITOT 0.9 0.5   ------------------------------------------------------------------------------------------------------------------  ------------------------------------------------------------------------------------------------------------------ GFR: Estimated Creatinine Clearance: 46 mL/min (A) (by C-G formula based on SCr of 1.62 mg/dL (H)). Liver Function Tests: Recent Labs  Lab 06/16/20 1124 06/19/20 2032  AST 12* 13*  ALT 31 22  ALKPHOS 52 53  BILITOT 0.9 0.5  PROT 4.9* 5.1*  ALBUMIN 2.4* 2.5*   Recent Labs  Lab 06/19/20 2032  LIPASE 32   No results for input(s): AMMONIA in the last 168 hours. Coagulation Profile: No results for input(s): INR, PROTIME in the last 168 hours. Cardiac Enzymes: No results for input(s): CKTOTAL, CKMB, CKMBINDEX, TROPONINI in the last 168  hours. BNP (last 3 results) No results for input(s): PROBNP in the last 8760 hours. HbA1C: No results for input(s): HGBA1C in the last 72 hours. CBG: No results for input(s): GLUCAP in the last 168 hours. Lipid Profile: No results for input(s): CHOL, HDL, LDLCALC, TRIG, CHOLHDL, LDLDIRECT in the last 72 hours. Thyroid Function Tests: No results for input(s): TSH, T4TOTAL, FREET4, T3FREE, THYROIDAB in the last 72 hours. Anemia Panel: No results for input(s): VITAMINB12, FOLATE, FERRITIN, TIBC, IRON, RETICCTPCT in the last 72 hours.  --------------------------------------------------------------------------------------------------------------- Urine analysis:    Component Value Date/Time   COLORURINE YELLOW 06/20/2020 0200   APPEARANCEUR CLEAR 06/20/2020 0200   LABSPEC 1.030 06/20/2020 0200   PHURINE 5.0 06/20/2020 0200   GLUCOSEU >=500 (A) 06/20/2020 0200   HGBUR NEGATIVE 06/20/2020 0200   BILIRUBINUR NEGATIVE 06/20/2020 0200   KETONESUR 5 (A) 06/20/2020 0200   PROTEINUR NEGATIVE 06/20/2020 0200   NITRITE NEGATIVE 06/20/2020 0200   LEUKOCYTESUR NEGATIVE 06/20/2020 0200      Imaging Results:    CT Abdomen Pelvis W Contrast  Result Date: 06/19/2020 CLINICAL DATA:  Epigastric pain. EXAM: CT ABDOMEN AND PELVIS WITH CONTRAST TECHNIQUE: Multidetector CT imaging of the abdomen and pelvis was performed using the standard protocol following bolus administration of intravenous contrast. CONTRAST:  145mL OMNIPAQUE IOHEXOL 300 MG/ML  SOLN COMPARISON:  None. FINDINGS: Lower chest: Right basilar opacity slightly greater than typically seen with atelectasis. Small right pleural effusion. Distal esophagus is dilated, fluid-filled, with diffuse wall thickening. Hepatobiliary: Motion artifact through the liver and gallbladder. Allowing for this, no focal liver abnormality is seen. Mild diffusely decreased hepatic density typical of steatosis. No abnormal gallbladder distention or calcified  gallstone. There is no biliary dilatation. Pancreas: Pancreas appears slightly truncated. Slight prominence of the proximal pancreatic duct at 3 mm. No peripancreatic fat stranding. Spleen: Normal in size without focal abnormality. Adrenals/Urinary Tract: No adrenal nodule. No normal left is seen. There may be a diminutive markedly atrophic kidney seen in the left renal fossa, series 2, image 30.  There is no right hydronephrosis. Multiple cysts as well as low-density lesions that are too small to characterize throughout the right renal parenchyma. There is no right perinephric edema. No visualized renal calculi. Mild bladder distension without wall thickening. Stomach/Bowel: Patulous distal esophagus with wall thickening and intraluminal fluid. There is mild wall thickening at the gastroesophageal junction. Stomach is distended with intraluminal fluid. Equivocal pre pyloric gastric wall thickening. Normal positioning of the duodenum and ligament of Treitz. There is no small bowel obstruction or inflammatory change. High-riding cecum in the right upper quadrant. The appendix is tentatively visualized, series 2, image 33, likely normal but partially obscured by motion. No periappendiceal fat stranding. Colonic tortuosity with moderate volume of colonic stool. Moderate to large stool primarily in the left colon with stool distending the rectum, rectal distention of 7 cm. No colonic or rectal wall thickening. Vascular/Lymphatic: Tortuous abdominal aorta with mild atherosclerosis. Patent portal vein. No acute vascular findings. No enlarged lymph nodes in the abdomen or pelvis. Reproductive: Prostate grossly normal, streak artifact from left hip arthroplasty obscures assessment. Other: No ascites or free air. Small fat containing umbilical hernia. 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 Complete L5-S1 disc space loss with associated facet hypertrophy. There is  additional mild multilevel degenerative change in the spine. Left hip arthroplasty. Right hip osteoarthritis. IMPRESSION: 1. Patulous distal esophagus with wall thickening and intraluminal fluid, suggesting reflux or esophagitis. 2. Fluid-filled stomach with equivocal pre pyloric gastric wall thickening, can be seen with gastritis or peptic ulcer disease. 3. Moderate to large volume of colonic stool with stool distending the rectum, suggesting constipation. 4. Solitary right kidney with multiple cysts. There may be a diminutive markedly atrophic left kidney, but this is questionable. 5. Mild hepatic steatosis. 6. 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 Aortic Atherosclerosis (ICD10-I70.0). Electronically Signed   By: Keith Rake M.D.   On: 06/19/2020 23:45   DG Chest Port 1 View  Result Date: 06/19/2020 CLINICAL DATA:  Chest pain EXAM: PORTABLE CHEST 1 VIEW COMPARISON:  06/16/2020 FINDINGS: Low lung volumes with minimal atelectasis at the right base. No consolidation or effusion. Stable cardiomediastinal silhouette. No pneumothorax IMPRESSION: Low lung volumes with minimal right basilar atelectasis. Electronically Signed   By: Donavan Foil M.D.   On: 06/19/2020 22:15       Assessment & Plan:    Active Problems:   AKI (acute kidney injury) ([place])   1. AKI 1. Creatinine at discharge from last hospitalization was 1.17 today it was 1.62 2. Patient was given 2 L of fluid in the ED 3. Trend in the a.m. 4. UA not indicative of UTI 5. Foley catheter in place 6. Monitor I's and O's 2. Abdominal pain 1. Shows esophagitis/gastritis, constipation, solitary right kidney, hepatic steatosis, pars interarticularis 2. Maalox as needed 3. Troponins flat 4. Slight leukocytosis at 13.3, patient remains afebrile -CT of abdomen does not show an infectious etiology.  Trend in the a.m. 3. Lactic acidosis 1. Initial lactic acid 2.5, decreased to 2.4 2. Continue to  trend 3. Chest x-ray, urine, CT abdomen do not show infectious etiology at this time 4. Hyponatremia/hypomagnesemia 1. 1 L NaCl given in the ED, 2 g of magnesium given in the ED 2. Recheck on a.m. labs 5. Moderate protein calorie malnutrition 1. Continue Ensure 2. Continue to monitor 6. Tachycardia 1. No hypoxia, blood pressure stable, low suspicion for PE 2. More likely dehydration in  the setting of AKI as well and lactic acidosis 3. 2 L of fluid improved heart rate from 120s to 99  continue to monitor on telemetry 7. Diabetes mellitus type 2 with hyperglycemia 1. Glucose in the ED was 409 start sliding scale coverage and carb modified diet 8.    DVT Prophylaxis-   heparin - SCDs AM Labs Ordered, also please review Full Orders  Family Communication: NO family at bedside Code Status:  Full  Admission status: Observation Time spent in minutes : [place] DO

## 2020-06-21 ENCOUNTER — Encounter (HOSPITAL_COMMUNITY)
Admission: EM | Disposition: A | Discharge status: Skilled Nursing Facility | Payer: Self-pay | Source: Home / Self Care | Attending: Internal Medicine

## 2020-06-21 ENCOUNTER — Encounter (HOSPITAL_COMMUNITY): Payer: Self-pay | Admitting: Internal Medicine

## 2020-06-21 ENCOUNTER — Inpatient Hospital Stay (HOSPITAL_COMMUNITY): Payer: Medicare Other

## 2020-06-21 ENCOUNTER — Inpatient Hospital Stay (HOSPITAL_COMMUNITY): Payer: Medicare Other | Admitting: Anesthesiology

## 2020-06-21 ENCOUNTER — Other Ambulatory Visit: Payer: Self-pay

## 2020-06-21 DIAGNOSIS — R109 Unspecified abdominal pain: Secondary | ICD-10-CM

## 2020-06-21 DIAGNOSIS — K317 Polyp of stomach and duodenum: Secondary | ICD-10-CM | POA: Diagnosis not present

## 2020-06-21 HISTORY — PX: ESOPHAGOGASTRODUODENOSCOPY (EGD) WITH PROPOFOL: SHX5813

## 2020-06-21 HISTORY — PX: BIOPSY: SHX5522

## 2020-06-21 LAB — CBC
HCT: 29.7 % — ABNORMAL LOW (ref 39.0–52.0)
Hemoglobin: 9.5 g/dL — ABNORMAL LOW (ref 13.0–17.0)
MCH: 28.6 pg (ref 26.0–34.0)
MCHC: 32 g/dL (ref 30.0–36.0)
MCV: 89.5 fL (ref 80.0–100.0)
Platelets: 339 10*3/uL (ref 150–400)
RBC: 3.32 MIL/uL — ABNORMAL LOW (ref 4.22–5.81)
RDW: 15.2 % (ref 11.5–15.5)
WBC: 11.7 10*3/uL — ABNORMAL HIGH (ref 4.0–10.5)
nRBC: 0.2 % (ref 0.0–0.2)

## 2020-06-21 LAB — GLUCOSE, CAPILLARY
Glucose-Capillary: 79 mg/dL (ref 70–99)
Glucose-Capillary: 86 mg/dL (ref 70–99)
Glucose-Capillary: 87 mg/dL (ref 70–99)
Glucose-Capillary: 99 mg/dL (ref 70–99)
Glucose-Capillary: 99 mg/dL (ref 70–99)
Glucose-Capillary: 110 mg/dL — ABNORMAL HIGH (ref 70–99)
Glucose-Capillary: 209 mg/dL — ABNORMAL HIGH (ref 70–99)

## 2020-06-21 LAB — COMPREHENSIVE METABOLIC PANEL
ALT: 50 U/L — ABNORMAL HIGH (ref 0–44)
AST: 55 U/L — ABNORMAL HIGH (ref 15–41)
Albumin: 1.8 g/dL — ABNORMAL LOW (ref 3.5–5.0)
Alkaline Phosphatase: 70 U/L (ref 38–126)
Anion gap: 4 — ABNORMAL LOW (ref 5–15)
BUN: 27 mg/dL — ABNORMAL HIGH (ref 8–23)
CO2: 28 mmol/L (ref 22–32)
Calcium: 9.9 mg/dL (ref 8.9–10.3)
Chloride: 100 mmol/L (ref 98–111)
Creatinine, Ser: 1.24 mg/dL (ref 0.61–1.24)
GFR, Estimated: >60 mL/min (ref >60)
Glucose, Bld: 79 mg/dL (ref 70–99)
Potassium: 4 mmol/L (ref 3.5–5.1)
Sodium: 132 mmol/L — ABNORMAL LOW (ref 135–145)
Total Bilirubin: 0.5 mg/dL (ref 0.3–1.2)
Total Protein: 4 g/dL — ABNORMAL LOW (ref 6.5–8.1)

## 2020-06-21 LAB — RESP PANEL BY RT-PCR (FLU A&B, COVID) ARPGX2
Influenza A by PCR: NEGATIVE
Influenza B by PCR: NEGATIVE
SARS Coronavirus 2 by RT PCR: NEGATIVE

## 2020-06-21 LAB — MAGNESIUM: Magnesium: 1.4 mg/dL — ABNORMAL LOW (ref 1.7–2.4)

## 2020-06-21 LAB — OCCULT BLOOD X 1 CARD TO LAB, STOOL: Fecal Occult Bld: POSITIVE — AB

## 2020-06-21 SURGERY — ESOPHAGOGASTRODUODENOSCOPY (EGD) WITH PROPOFOL
Anesthesia: General

## 2020-06-21 MED ORDER — LACTATED RINGERS IV SOLN: INTRAVENOUS | Status: DC | PRN

## 2020-06-21 MED ORDER — MAGNESIUM SULFATE 2 GM/50ML IV SOLN
2.0000 g | Freq: Once | INTRAVENOUS | Status: AC
  Administered 2020-06-21: 2 g via INTRAVENOUS
  Filled 2020-06-21: qty 50

## 2020-06-21 MED ORDER — PHENYLEPHRINE 40 MCG/ML (10ML) SYRINGE FOR IV PUSH (FOR BLOOD PRESSURE SUPPORT)
PREFILLED_SYRINGE | INTRAVENOUS | Status: AC
  Filled 2020-06-21: qty 20

## 2020-06-21 MED ORDER — SUCCINYLCHOLINE CHLORIDE 20 MG/ML IJ SOLN
INTRAMUSCULAR | Status: DC | PRN
  Administered 2020-06-21: 200 mg via INTRAVENOUS

## 2020-06-21 MED ORDER — VASOPRESSIN 20 UNIT/ML IV SOLN
INTRAVENOUS | Status: DC | PRN
  Administered 2020-06-21: 1 [IU] via INTRAVENOUS

## 2020-06-21 MED ORDER — PROPOFOL 10 MG/ML IV BOLUS
INTRAVENOUS | Status: DC | PRN
  Administered 2020-06-21: 70 mg via INTRAVENOUS

## 2020-06-21 MED ORDER — LACTATED RINGERS IV SOLN: Freq: Once | INTRAVENOUS | Status: AC

## 2020-06-21 MED ORDER — SODIUM CHLORIDE 0.9 % IV SOLN: INTRAVENOUS | Status: DC

## 2020-06-21 MED ORDER — EPHEDRINE SULFATE 50 MG/ML IJ SOLN
INTRAMUSCULAR | Status: DC | PRN
  Administered 2020-06-21: 10 mg via INTRAVENOUS
  Administered 2020-06-21: 25 mg via INTRAVENOUS

## 2020-06-21 MED ORDER — VASOPRESSIN 20 UNIT/ML IV SOLN
INTRAVENOUS | Status: AC
  Filled 2020-06-21: qty 1

## 2020-06-21 MED ORDER — SUCRALFATE 1 GM/10ML PO SUSP
1.0000 g | Freq: Three times a day (TID) | ORAL | Status: DC
  Administered 2020-06-21 – 2020-06-26 (×19): 1 g via ORAL
  Filled 2020-06-21 (×19): qty 10

## 2020-06-21 MED ORDER — ONDANSETRON HCL 4 MG/2ML IJ SOLN
INTRAMUSCULAR | Status: DC | PRN
  Administered 2020-06-21: 4 mg via INTRAVENOUS

## 2020-06-21 MED ORDER — ONDANSETRON HCL 4 MG/2ML IJ SOLN
INTRAMUSCULAR | Status: AC
  Filled 2020-06-21: qty 2

## 2020-06-21 MED ORDER — EPHEDRINE 5 MG/ML INJ
INTRAVENOUS | Status: AC
  Filled 2020-06-21: qty 10

## 2020-06-21 MED ORDER — PHENYLEPHRINE HCL (PRESSORS) 10 MG/ML IV SOLN
INTRAVENOUS | Status: DC | PRN
  Administered 2020-06-21 (×3): 200 ug via INTRAVENOUS

## 2020-06-21 NOTE — Anesthesia Postprocedure Evaluation (Signed)
Anesthesia Post Note  Patient: Daniel [name]  Procedure(s) Performed: ESOPHAGOGASTRODUODENOSCOPY (EGD) WITH PROPOFOL (N/A ) BIOPSY  Patient location during evaluation: PACU Anesthesia Type: General Level of consciousness: awake, awake and alert, patient cooperative and responds to stimulation Pain management: pain level controlled Vital Signs Assessment: post-procedure vital signs reviewed and stable Respiratory status: spontaneous breathing, respiratory function stable and patient connected to face mask oxygen Cardiovascular status: blood pressure returned to baseline and stable Postop Assessment: no headache and no backache Anesthetic complications: no   No complications documented.   Last Vitals:  Vitals:   06/21/20 0452 06/21/20 1013  BP: 103/68 118/77  Pulse: 83 (!) 103  Resp: 19 19  Temp: 36.8 C 36.7 C  SpO2: 96% 93%    Last Pain:  Vitals:   06/21/20 1140  TempSrc:   PainSc: 0-No pain                 Tacy Learn

## 2020-06-21 NOTE — Anesthesia Procedure Notes (Signed)
Procedure Name: Intubation Performed by: Tacy Learn, CRNA Pre-anesthesia Checklist: Patient identified, Emergency Drugs available, Suction available, Patient being monitored and Timeout performed Patient Re-evaluated:Patient Re-evaluated prior to induction Oxygen Delivery Method: Circle system utilized Preoxygenation: Pre-oxygenation with 100% oxygen Induction Type: IV induction Laryngoscope Size: Miller and 3 Grade View: Grade I Tube size: 7.5 mm Number of attempts: 1 Airway Equipment and Method: Stylet Placement Confirmation: ETT inserted through vocal cords under direct vision,  positive ETCO2,  CO2 detector and breath sounds checked- equal and bilateral Secured at: 22 cm Tube secured with: Tape Dental Injury: Teeth and Oropharynx as per pre-operative assessment

## 2020-06-21 NOTE — TOC Initial Note (Signed)
Transition of Care [facility]) - Initial/Assessment Note   Patient Details  Name: Daniel [name] MRN: [id number] Date of Birth: Nov 21, 1950  Transition of Care [facility]) CM/SW Contact:    Sherie Don, LCSW Phone Number: 06/21/2020, 1:41 PM  Clinical Narrative: Patient is a 70 year old male who was admitted for AKI. Patient has a history of dementia, IDD, hypertension, type 2 diabetes mellitus, BPH, osteoarthritis of right knee, and stroke. Readmission checklist completed due to high readmission score.  CSW completed assessment with patient's legal guardian, Daniel [name]. Per Ms. [name], the patient's food and fluid intake has decreased significantly over the past few weeks as the patient was complaining of "chest pain." Patient is independent at baseline with his ADLs, but has required more verbal prompts for several weeks and he has been more lethargic. Ms. March Rummage reported the patient is normally very social, but this has been recently affected as well. Although patient has IDD, Ms. March Rummage stated the patient did not require a legal guardian until he had a stroke in July 2021. Ms. March Rummage reported no issues with the patient taking his medications. TOC to follow for discharge needs.            Expected Discharge Plan: Group Home Barriers to Discharge: Continued Medical Work up  Patient Goals and CMS Choice Patient states their goals for this hospitalization and ongoing recovery are:: Return to group home CMS Medicare.gov Compare Post Acute Care list provided to:: Patient Represenative (must comment) (Daniel [name] (legal guardian)) Choice offered to / list presented to : [place] / Guardian  Expected Discharge Plan and Services Expected Discharge Plan: Group Home In-house Referral: Clinical Social Work Discharge Planning Services: NA Post Acute Care Choice: NA Living arrangements for the past 2 months: Group Home            DME Arranged: N/A DME Agency: NA HH Arranged: NA [place] Agency: NA  Prior Living  Arrangements/Services Living arrangements for the past 2 months: Group Home Lives with:: Facility Resident Patient language and need for interpreter reviewed:: Yes Do you feel safe going back to the place where you live?: Yes      Need for Family Participation in Patient Care: Yes (Comment) Care giver support system in place?: Yes (comment) Criminal Activity/Legal Involvement Pertinent to Current Situation/Hospitalization: No - Comment as needed  Permission Sought/Granted Permission sought to share information with : Guardian Permission granted to share information with : Yes, Verbal Permission Granted Share Information with NAME: Daniel [name] Permission granted to share info w Relationship: Legal guardian [phone])  Emotional Assessment Appearance:: Appears stated age Orientation: : Oriented to Self Alcohol / Substance Use: Not Applicable Psych Involvement: No (comment)  Admission diagnosis:  Hyponatremia [E87.1] Tachycardia [R00.0] Acute kidney injury (nontraumatic) (HCC) [N17.9] Upper abdominal pain [R10.10] AKI (acute kidney injury) ([place]) [N17.9] Elevated lactic acid level [R79.89] Normochromic normocytic anemia [D64.9] Abdominal pain [R10.9] Patient Active Problem List   Diagnosis Date Noted  . Abdominal pain 06/20/2020  . Normochromic normocytic anemia   . Upper abdominal pain   . Abnormal CT scan, esophagus   . Abnormal CT scan, stomach   . Dilation of pancreatic duct   . Constipation   . Hyperglycemia   . History of stroke   . Hypokalemia   . Hypomagnesemia   . AKI (acute kidney injury) ([place]) 06/06/2020  . Acute CVA (cerebrovascular accident) ([place]) 01/05/2020  . Right knee pain 01/01/2017  . Osteoarthritis of right knee 01/01/2017  . Mental impairment 09/23/2016  .  Hypertension 09/23/2016  . Type 2 diabetes mellitus without complication, without long-term current use of insulin ([place]) 09/23/2016  . BPH (benign prostatic hyperplasia) 09/23/2016  .  Dementia ([place]) 09/23/2016  . 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 [place] [address] Phone: [phone] Fax: [phone]  Readmission Risk Interventions Readmission Risk Prevention Plan 06/21/2020 06/08/2020  Transportation Screening Complete Complete  PCP or Specialist Appt within 5-7 Days - Complete  Home Care Screening - Complete  Medication Review (RN CM) - Complete  HRI or Home Care Consult Complete -  Social Work Consult for [place] Planning/Counseling Complete -  Palliative Care Screening Not Applicable -  Medication Review Press photographer) Complete -  Some recent data might be hidden

## 2020-06-21 NOTE — Anesthesia Preprocedure Evaluation (Addendum)
Anesthesia Evaluation  Patient identified by MRN, date of birth, ID band Patient awake    Reviewed: Allergy & Precautions, NPO status , Patient's Chart, lab work & pertinent test results  History of Anesthesia Complications Negative for: history of anesthetic complications  Airway Mallampati: III  TM Distance: >3 FB Neck ROM: Full    Dental  (+) Dental Advisory Given, Missing   Pulmonary    Pulmonary exam normal breath sounds clear to auscultation       Cardiovascular Exercise Tolerance: Good hypertension, Pt. on medications  Rhythm:Regular Rate:Tachycardia  19-Jun-2020 21:21:55 [facility] System-AP-ED ROUTINE RECORD Sinus tachycardia Rightward axis Cannot rule out Anterior infarct , age undetermined Abnormal ECG No significant change since last tracing Confirmed by Gareth Morgan [phone]) on 06/21/2020 9:12:53 AM   Neuro/Psych PSYCHIATRIC DISORDERS Dementia CVA    GI/Hepatic negative GI ROS, Neg liver ROS,   Endo/Other  diabetes, Well Controlled, Type 2, Oral Hypoglycemic Agents  Renal/GU Renal disease (AKI)     Musculoskeletal  (+) Arthritis ,   Abdominal   Peds  Hematology  (+) anemia ,   Anesthesia Other Findings   Reproductive/Obstetrics                            Anesthesia Physical Anesthesia Plan  ASA: III  Anesthesia Plan: General   Post-op Pain Management:    Induction: Intravenous  PONV Risk Score and Plan: TIVA  Airway Management Planned: Nasal Cannula and Natural Airway  Additional Equipment:   Intra-op Plan:   Post-operative Plan:   Informed Consent: I have reviewed the patients History and Physical, chart, labs and discussed the procedure including the risks, benefits and alternatives for the proposed anesthesia with the patient or authorized representative who has indicated his/her understanding and acceptance.     Dental advisory given and  Consent reviewed with POA  Plan Discussed with: CRNA and Surgeon  Anesthesia Plan Comments:        Anesthesia Quick Evaluation

## 2020-06-21 NOTE — Progress Notes (Signed)
PROGRESS NOTE    [name]  GEZ:[id number] DOB: [birth year] DOA: 06/19/2020 PCP: Leonie Douglas, MD   Brief Narrative:  Daniel [name] y.o.male,with history of hypertension, hyperlipidemia, diabetes mellitus presents to the ED with a chief complaint of epigastric pain. Patient is able to point on his abdomen to tell me where the pain is.  He has had multiple ED visits and hospital admissions in the last 3 weeks for different issues.  He had a very abnormal abdominal CT scan with dilated esophagus that is fluid-filled with diffuse wall thickening as well as signs of gastritis or peptic ulcer disease noted.    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  Resumed on clear liquid diet.  PPI twice daily as well as Carafate.  Assessment & Plan:   Active Problems:   AKI (acute kidney injury) ([place])   Normochromic normocytic anemia   Upper abdominal pain   Abnormal CT scan, esophagus   Abnormal CT scan, stomach   Dilation of pancreatic duct   Constipation   Abdominal pain   Recurrent abdominal symptoms -Status post EGD 1/13 with noted gastric ulcers as well as moderate severe reflux esophagitis -Appreciate GI evaluation -Plan for clear liquid diet and advance as tolerated -PPI twice daily -Carafate  Dilated pancreatic duct -MRCP pending  AKI-resolved -Baseline near 1.2 -Continue to monitor  Hypomagnesemia-persistent -Continue repletion -Recheck in am  Chronic anemia-downtrending -Noted folate and B12 deficiency for which supplementation has been started -No overt bleeding noted -EGD results as noted above -Repeat CBC in a.m. -Avoid further use of aspirin, Plavix, and heparin agents.  Apparently antiplatelets were stopped by his neurologist months ago.  Moderate protein calorie malnutrition -Continue on Ensure  Diabetes type 2 -Hyperglycemia  improved -Started on clear liquid diet -Maintain on SSI  DVT prophylaxis: SCDs Code Status: Full Family Communication: Discussed with legal guardian 1/13 Disposition Plan:  Status is: Inpatient  Remains inpatient appropriate because:IV treatments appropriate due to intensity of illness or inability to take PO and Inpatient level of care appropriate due to severity of illness   Dispo: The patient is from: Home              Anticipated d/c is to: Home              Anticipated d/c date is: 1 day              Patient currently is not medically stable to d/c.  Currently on clear liquid diet with further test pending.  Advance as tolerated and then will be ready for discharge in 1-2 days.   Consultants:   GI  Procedures:   EGD with biopsy 1/13  See below  Antimicrobials:   None  Subjective: Patient seen and evaluated today with no new acute complaints or concerns. No acute concerns or events noted overnight.  Objective: Vitals:   06/21/20 1013 06/21/20 1210 06/21/20 1215 06/21/20 1230  BP: 118/77  109/69 114/76  Pulse: (!) 103  (!) 103   Resp: 19  (!) 33 (!) 23  Temp: 98 F (36.7 C) 97.8 F (36.6 C)    TempSrc: Oral     SpO2: 93% 98% 100%   Weight:        Intake/Output Summary (Last 24 hours) at 06/21/2020 1247 Last data filed at 06/21/2020 1230 Gross per 24 hour  Intake 1579.34 ml  Output 1650 ml  Net -70.66  ml   Filed Weights   06/21/20 0600  Weight: 74.5 kg    Examination:  General exam: Appears calm and comfortable, MR Respiratory system: Clear to auscultation. Respiratory effort normal. Cardiovascular system: S1 & S2 heard, RRR.  Gastrointestinal system: Abdomen is soft Central nervous system: Alert and awake Extremities: No edema Skin: No significant lesions noted Psychiatry: Flat affect.    Data Reviewed: I have personally reviewed following labs and imaging studies  CBC: Recent Labs  Lab 06/16/20 1124 06/19/20 2032 06/20/20 0533  06/21/20 0459  WBC 13.2* 13.3* 10.9* 11.7*  NEUTROABS  --   --  8.9*  --   HGB 12.1* 12.2* 10.3* 9.5*  HCT 37.2* 37.7* 31.4* 29.7*  MCV 87.9 89.8 87.7 89.5  PLT 375 406* 367 591   Basic Metabolic Panel: Recent Labs  Lab 06/16/20 1124 06/19/20 2032 06/20/20 0533 06/21/20 0459  NA 127* 128* 128* 132*  K 4.8 5.0 4.8 4.0  CL 91* 93* 96* 100  CO2 24 24 25 28   GLUCOSE 269* 409* 379* 79  BUN 36* 37* 33* 27*  CREATININE 1.45* 1.62* 1.41* 1.24  CALCIUM 9.2 10.0 9.3 9.9  MG 0.9* 1.2* 1.5* 1.4*   GFR: Estimated Creatinine Clearance: 60.9 mL/min (by C-G formula based on SCr of 1.24 mg/dL). Liver Function Tests: Recent Labs  Lab 06/16/20 1124 06/19/20 2032 06/20/20 0533 06/21/20 0459  AST 12* 13* 10* 55*  ALT 31 22 21  50*  ALKPHOS 52 53 40 70  BILITOT 0.9 0.5 0.6 0.5  PROT 4.9* 5.1* 4.2* 4.0*  ALBUMIN 2.4* 2.5* 2.0* 1.8*   Recent Labs  Lab 06/19/20 2032  LIPASE 32   No results for input(s): AMMONIA in the last 168 hours. Coagulation Profile: No results for input(s): INR, PROTIME in the last 168 hours. Cardiac Enzymes: No results for input(s): CKTOTAL, CKMB, CKMBINDEX, TROPONINI in the last 168 hours. BNP (last 3 results) No results for input(s): PROBNP in the last 8760 hours. HbA1C: No results for input(s): HGBA1C in the last 72 hours. CBG: Recent Labs  Lab 06/20/20 1635 06/20/20 2038 06/21/20 0729 06/21/20 0956 06/21/20 1222  GLUCAP 236* 147* 79 86 87   Lipid Profile: No results for input(s): CHOL, HDL, LDLCALC, TRIG, CHOLHDL, LDLDIRECT in the last 72 hours. Thyroid Function Tests: Recent Labs    06/20/20 0533  TSH 0.312*   Anemia Panel: Recent Labs    06/20/20 0942  VITAMINB12 173*  FOLATE 5.9*  FERRITIN 109  TIBC 157*  IRON 45   Sepsis Labs: Recent Labs  Lab 06/19/20 2043 06/19/20 2248 06/20/20 0731  LATICACIDVEN 2.5* 2.4* 0.9    Recent Results (from the past 240 hour(s))  Culture, blood (routine x 2)     Status: None (Preliminary  result)   Collection Time: 06/19/20 10:48 PM   Specimen: BLOOD RIGHT HAND  Result Value Ref Range Status   Specimen Description BLOOD RIGHT HAND  Final   Special Requests   Final    BOTTLES DRAWN AEROBIC AND ANAEROBIC Blood Culture adequate volume   Culture   Final    NO GROWTH 2 DAYS Performed at [facility], [address], [place], [place] [address]    Report Status PENDING  Incomplete  Culture, blood (routine x 2)     Status: None (Preliminary result)   Collection Time: 06/19/20 10:58 PM   Specimen: BLOOD LEFT ARM  Result Value Ref Range Status   Specimen Description BLOOD LEFT ARM  Final   Special Requests  Final    BOTTLES DRAWN AEROBIC AND ANAEROBIC Blood Culture adequate volume   Culture   Final    NO GROWTH 2 DAYS Performed at [facility], [address], [place], [place] [address]    Report Status PENDING  Incomplete  Resp Panel by RT-PCR (Flu A&B, Covid) Nasopharyngeal Swab     Status: None   Collection Time: 06/21/20  8:00 AM   Specimen: Nasopharyngeal Swab; Nasopharyngeal(NP) swabs in vial transport medium  Result Value Ref Range Status   SARS Coronavirus 2 by RT PCR NEGATIVE NEGATIVE Final    Comment: (NOTE) SARS-CoV-2 target nucleic acids are NOT DETECTED.  The SARS-CoV-2 RNA is generally detectable in upper respiratory specimens during the acute phase of infection. The lowest concentration of SARS-CoV-2 viral copies this assay can detect is 138 copies/mL. A negative result does not preclude SARS-Cov-2 infection and should not be used as the sole basis for treatment or other patient management decisions. A negative result may occur with  improper specimen collection/handling, submission of specimen other than nasopharyngeal swab, presence of viral mutation(s) within the areas targeted by this assay, and inadequate number of viral copies(<138 copies/mL). A negative result must be combined with clinical observations, patient history, and  epidemiological information. The expected result is Negative.  Fact Sheet for Patients:  EntrepreneurPulse.com.au  Fact Sheet for Healthcare Providers:  IncredibleEmployment.be  This test is no t yet approved or cleared by the [place] FDA and  has been authorized for detection and/or diagnosis of SARS-CoV-2 by FDA under an Emergency Use Authorization (EUA). This EUA will remain  in effect (meaning this test can be used) for the duration of the COVID-19 declaration under Section 564(b)(1) of the Act, 21 U.S.C.section 360bbb-3(b)(1), unless the authorization is terminated  or revoked sooner.       Influenza A by PCR NEGATIVE NEGATIVE Final   Influenza B by PCR NEGATIVE NEGATIVE Final    Comment: (NOTE) The Xpert Xpress SARS-CoV-2/FLU/RSV plus assay is intended as an aid in the diagnosis of influenza from Nasopharyngeal swab specimens and should not be used as a sole basis for treatment. Nasal washings and aspirates are unacceptable for Xpert Xpress SARS-CoV-2/FLU/RSV testing.  Fact Sheet for Patients: EntrepreneurPulse.com.au  Fact Sheet for Healthcare Providers: IncredibleEmployment.be  This test is not yet approved or cleared by the [place] FDA and has been authorized for detection and/or diagnosis of SARS-CoV-2 by FDA under an Emergency Use Authorization (EUA). This EUA will remain in effect (meaning this test can be used) for the duration of the COVID-19 declaration under Section 564(b)(1) of the Act, 21 U.S.C. section 360bbb-3(b)(1), unless the authorization is terminated or revoked.  Performed at [facility], [address], [place], [place] [address]          Radiology Studies: CT Abdomen Pelvis W Contrast  Result Date: 06/19/2020 CLINICAL DATA:  Epigastric pain. EXAM: CT ABDOMEN AND PELVIS WITH CONTRAST TECHNIQUE: Multidetector CT imaging of the abdomen and pelvis was performed  using the standard protocol following bolus administration of intravenous contrast. CONTRAST:  117mL OMNIPAQUE IOHEXOL 300 MG/ML  SOLN COMPARISON:  None. FINDINGS: Lower chest: Right basilar opacity slightly greater than typically seen with atelectasis. Small right pleural effusion. Distal esophagus is dilated, fluid-filled, with diffuse wall thickening. Hepatobiliary: Motion artifact through the liver and gallbladder. Allowing for this, no focal liver abnormality is seen. Mild diffusely decreased hepatic density typical of steatosis. No abnormal gallbladder distention or calcified gallstone. There is no biliary dilatation. Pancreas: Pancreas appears  slightly truncated. Slight prominence of the proximal pancreatic duct at 3 mm. No peripancreatic fat stranding. Spleen: Normal in size without focal abnormality. Adrenals/Urinary Tract: No adrenal nodule. No normal left is seen. There may be a diminutive markedly atrophic kidney seen in the left renal fossa, series 2, image 30. There is no right hydronephrosis. Multiple cysts as well as low-density lesions that are too small to characterize throughout the right renal parenchyma. There is no right perinephric edema. No visualized renal calculi. Mild bladder distension without wall thickening. Stomach/Bowel: Patulous distal esophagus with wall thickening and intraluminal fluid. There is mild wall thickening at the gastroesophageal junction. Stomach is distended with intraluminal fluid. Equivocal pre pyloric gastric wall thickening. Normal positioning of the duodenum and ligament of Treitz. There is no small bowel obstruction or inflammatory change. High-riding cecum in the right upper quadrant. The appendix is tentatively visualized, series 2, image 33, likely normal but partially obscured by motion. No periappendiceal fat stranding. Colonic tortuosity with moderate volume of colonic stool. Moderate to large stool primarily in the left colon with stool distending the  rectum, rectal distention of 7 cm. No colonic or rectal wall thickening. Vascular/Lymphatic: Tortuous abdominal aorta with mild atherosclerosis. Patent portal vein. No acute vascular findings. No enlarged lymph nodes in the abdomen or pelvis. Reproductive: Prostate grossly normal, streak artifact from left hip arthroplasty obscures assessment. Other: No ascites or free air. Small fat containing umbilical hernia. 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 Complete L5-S1 disc space loss with associated facet hypertrophy. There is additional mild multilevel degenerative change in the spine. Left hip arthroplasty. Right hip osteoarthritis. IMPRESSION: 1. Patulous distal esophagus with wall thickening and intraluminal fluid, suggesting reflux or esophagitis. 2. Fluid-filled stomach with equivocal pre pyloric gastric wall thickening, can be seen with gastritis or peptic ulcer disease. 3. Moderate to large volume of colonic stool with stool distending the rectum, suggesting constipation. 4. Solitary right kidney with multiple cysts. There may be a diminutive markedly atrophic left kidney, but this is questionable. 5. Mild hepatic steatosis. 6. 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 Aortic Atherosclerosis (ICD10-I70.0). Electronically Signed   By: Keith Rake M.D.   On: 06/19/2020 23:45   DG Chest Port 1 View  Result Date: 06/19/2020 CLINICAL DATA:  Chest pain EXAM: PORTABLE CHEST 1 VIEW COMPARISON:  06/16/2020 FINDINGS: Low lung volumes with minimal atelectasis at the right base. No consolidation or effusion. Stable cardiomediastinal silhouette. No pneumothorax IMPRESSION: Low lung volumes with minimal right basilar atelectasis. Electronically Signed   By: Donavan Foil M.D.   On: 06/19/2020 22:15        Scheduled Meds: . [MAR Hold] amitriptyline  10 mg Oral QHS  . [MAR Hold] atorvastatin  40 mg Oral Daily   . [MAR Hold] clopidogrel  75 mg Oral Daily  . [MAR Hold] donepezil  10 mg Oral QHS  . [MAR Hold] feeding supplement  237 mL Oral BID BM  . [MAR Hold] folic acid  1 mg Oral Daily  . [MAR Hold] insulin aspart  0-15 Units Subcutaneous TID WC  . [MAR Hold] insulin aspart  0-5 Units Subcutaneous QHS  . [MAR Hold] insulin detemir  12 Units Subcutaneous Daily  . [MAR Hold] memantine  10 mg Oral BID  . [MAR Hold] metoprolol tartrate  25 mg Oral BID  . [MAR Hold] oxybutynin  5 mg Oral Daily  . [MAR Hold] pantoprazole ([place]) IV  40 mg Intravenous Q12H  . [MAR Hold] polyethylene glycol  17 g Oral Daily  . sucralfate  1 g Oral TID WC & HS  . [MAR Hold] tamsulosin  0.4 mg Oral QHS  . [MAR Hold] traZODone  100 mg Oral QHS   Continuous Infusions: . sodium chloride 75 mL/hr at 06/20/20 2315  . sodium chloride 20 mL/hr at 06/21/20 1019  . magnesium sulfate bolus IVPB       LOS: 1 day    Time spent: 35 minutes    Pratik Darleen Crocker, DO [facility]  If 7PM-7AM, please contact night-coverage [email or web address] 06/21/2020, 12:47 PM

## 2020-06-21 NOTE — Transfer of Care (Signed)
Immediate Anesthesia Transfer of Care Note  Patient: Daniel [name]  Procedure(s) Performed: ESOPHAGOGASTRODUODENOSCOPY (EGD) WITH PROPOFOL (N/A ) BIOPSY  Patient Location: Endoscopy Unit  Anesthesia Type:General  Level of Consciousness: awake, alert  and patient cooperative  Airway & Oxygen Therapy: Patient Spontanous Breathing and Patient connected to nasal cannula oxygen  Post-op Assessment: Report given to RN, Post -op Vital signs reviewed and stable and Patient moving all extremities  Post vital signs: Reviewed and stable  Last Vitals:  Vitals Value Taken Time  BP 110/61 06/21/20 1208  Temp    Pulse 108 06/21/20 1209  Resp 28 06/21/20 1209  SpO2 90 % 06/21/20 1209  Vitals shown include unvalidated device data.  Last Pain:  Vitals:   06/21/20 1140  TempSrc:   PainSc: 0-No pain      Patients Stated Pain Goal: Other (Comment) (unable to assess due to altered mental status) (58/83/25 4982)  Complications: No complications documented.

## 2020-06-21 NOTE — Interval H&P Note (Signed)
History and Physical Interval Note:  06/21/2020 11:04 AM  Daniel [name]  has presented today for surgery, with the diagnosis of Epigastric pain and abnormal CT.  The various methods of treatment have been discussed with the patient and family. After consideration of risks, benefits and other options for treatment, the patient has consented to  Procedure(s): ESOPHAGOGASTRODUODENOSCOPY (EGD) WITH PROPOFOL (N/A) as a surgical intervention.  The patient's history has been reviewed, patient examined, no change in status, stable for surgery.  I have reviewed the patient's chart and labs.  Questions were answered to the patient's satisfaction.     Daniel [name]  Patient seen and examined in the endoscopy unit this morning hemoglobin down to 9.5.  Discussed with Drs. [place].  Abdomen remains distended.  Given CT findings of fluid-filled stomach recently, would be best practice  to protect his airway via endotracheal intubation for this procedure. Procedure has been reviewed with patient's guardian at length along with the potential risk and benefits by Dr. Laural Golden. Further recommendations to follow once procedures been completed.

## 2020-06-21 NOTE — Op Note (Addendum)
[facility] Patient Name: Daniel [name] Procedure Date: 06/21/2020 10:57 AM MRN: [id number] Date of Birth: 05-09-1951 Attending MD: Norvel Richards , MD CSN: [id number] Age: 70 Admit Type: Outpatient Procedure:                Upper GI endoscopy Indications:              Epigastric abdominal pain, Abnormal CT of the GI                            tract Providers:                Norvel Richards, MD, Gwenlyn Fudge, RN, Rosina Lowenstein, RN Referring MD:             Leonie Douglas Medicines:                General Anesthesia Complications:            No immediate complications. Estimated blood loss:                            None. Estimated Blood Loss:     Estimated blood loss was minimal. Procedure:                Pre-Anesthesia Assessment:                           - Prior to the procedure, a History and Physical                            was performed, and patient medications and                            allergies were reviewed. The patient's tolerance of                            previous anesthesia was also reviewed. The risks                            and benefits of the procedure and the sedation                            options and risks were discussed with the patient.                            All questions were answered, and informed consent                            was obtained. Prior Anticoagulants: The patient                            last took heparin 1 day and Plavix (clopidogrel) 2  days prior to the procedure. ASA Grade Assessment:                            III - A patient with severe systemic disease. After                            reviewing the risks and benefits, the patient was                            deemed in satisfactory condition to undergo the                            procedure.                           After obtaining informed consent, the endoscope was                             passed under direct vision. Throughout the                            procedure, the patient's blood pressure, pulse, and                            oxygen saturations were monitored continuously. The                            [id number] scope was introduced through the                            mouth, and advanced to the second part of duodenum.                            The upper GI endoscopy was accomplished without                            difficulty. The patient tolerated the procedure                            well. The patient tolerated the procedure well. Scope In: 11:50:04 AM Scope Out: 12:00:08 PM Total Procedure Duration: 0 hours 10 minutes 4 seconds  Findings:      Inflammation of the entire tubular esophagus characterized by erosions       and overlying exudate most consistent with severe reflux esophagitis. No       pseudomembranes. Large amount of gastric fluid easily suctioned out.       Patient with (4) gastric ulcers?distal greater curvature/antrum, largest       approximately 1.5 x 2.5 cm in dimensions. No bleeding stigmata.       Surrounding erosions. Patent pylorus. Examination the bulb and second       portion revealed 2.5x3.5 frondy polypoid mass in the second portion -       opposite the ampulla. Please see multiple photographs.      Gastric mucosal biopsies x2 taken to assess for H. pylori.  This was done       without difficulty and with minimal bleeding. Impression:               -Moderately severe reflux esophagitis.                           -Multiple gastric ulcers without bleeding stigmata                            and associated gastric erosions. Patent pylorus.                           -Duodenal mass. Status post gastric mucosal biopsy Moderate Sedation:      Moderate (conscious) sedation was personally administered by an       anesthesia professional. The following parameters were monitored: oxygen       saturation, heart rate, blood  pressure, respiratory rate, EKG, adequacy       of pulmonary ventilation, and response to care. Recommendation:           - Return patient to hospital ward for ongoing care.                           - Clear liquid diet.                           - Continue twice daily PPI therapy. Add Carafate                            suspension 1 g 4 times daily. Agree with stopping                            aspirin, Plavix and heparin indefinitely. Follow-up                            on gastric mucosal biopsies to assess for H.                            pylori. Will need a repeat endoscopy in 12 weeks to                            assess for ulcer healing and EMR of duodenal lesion. Procedure Code(s):        --- Professional ---                           [id number], Esophagogastroduodenoscopy, flexible,                            transoral; diagnostic, including collection of                            specimen(s) by brushing or washing, when performed                            (separate procedure) Diagnosis Code(s):        --- Professional ---  R10.13, Epigastric pain                           R93.3, Abnormal findings on diagnostic imaging of                            other parts of digestive tract CPT copyright 2019 American Medical Association. All rights reserved. The codes documented in this report are preliminary and upon coder review may  be revised to meet current compliance requirements. Cristopher Estimable. Gonsalo Cuthbertson, MD Norvel Richards, MD 06/21/2020 12:39:59 PM This report has been signed electronically. Number of Addenda: 0

## 2020-06-21 NOTE — Progress Notes (Signed)
Patient has had no stools this shift. 

## 2020-06-22 ENCOUNTER — Encounter: Payer: Self-pay | Admitting: Internal Medicine

## 2020-06-22 ENCOUNTER — Telehealth: Payer: Self-pay | Admitting: Nurse Practitioner

## 2020-06-22 ENCOUNTER — Other Ambulatory Visit: Payer: Self-pay

## 2020-06-22 DIAGNOSIS — R109 Unspecified abdominal pain: Secondary | ICD-10-CM | POA: Diagnosis not present

## 2020-06-22 LAB — GLUCOSE, CAPILLARY
Glucose-Capillary: 164 mg/dL — ABNORMAL HIGH (ref 70–99)
Glucose-Capillary: 164 mg/dL — ABNORMAL HIGH (ref 70–99)
Glucose-Capillary: 97 mg/dL (ref 70–99)
Glucose-Capillary: 62 mg/dL — ABNORMAL LOW (ref 70–99)
Glucose-Capillary: 69 mg/dL — ABNORMAL LOW (ref 70–99)
Glucose-Capillary: 62 mg/dL — ABNORMAL LOW (ref 70–99)

## 2020-06-22 LAB — COMPREHENSIVE METABOLIC PANEL
ALT: 58 U/L — ABNORMAL HIGH (ref 0–44)
AST: 35 U/L (ref 15–41)
Albumin: 1.9 g/dL — ABNORMAL LOW (ref 3.5–5.0)
Alkaline Phosphatase: 82 U/L (ref 38–126)
Anion gap: 6 (ref 5–15)
BUN: 22 mg/dL (ref 8–23)
CO2: 27 mmol/L (ref 22–32)
Calcium: 10.2 mg/dL (ref 8.9–10.3)
Chloride: 98 mmol/L (ref 98–111)
Creatinine, Ser: 1.27 mg/dL — ABNORMAL HIGH (ref 0.61–1.24)
GFR, Estimated: >60 mL/min (ref >60)
Glucose, Bld: 170 mg/dL — ABNORMAL HIGH (ref 70–99)
Potassium: 4.1 mmol/L (ref 3.5–5.1)
Sodium: 131 mmol/L — ABNORMAL LOW (ref 135–145)
Total Bilirubin: 0.5 mg/dL (ref 0.3–1.2)
Total Protein: 4.1 g/dL — ABNORMAL LOW (ref 6.5–8.1)

## 2020-06-22 LAB — CBC
HCT: 29.9 % — ABNORMAL LOW (ref 39.0–52.0)
Hemoglobin: 9.4 g/dL — ABNORMAL LOW (ref 13.0–17.0)
MCH: 28.1 pg (ref 26.0–34.0)
MCHC: 31.4 g/dL (ref 30.0–36.0)
MCV: 89.5 fL (ref 80.0–100.0)
Platelets: 303 10*3/uL (ref 150–400)
RBC: 3.34 MIL/uL — ABNORMAL LOW (ref 4.22–5.81)
RDW: 15.5 % (ref 11.5–15.5)
WBC: 7.5 10*3/uL (ref 4.0–10.5)
nRBC: 0.3 % — ABNORMAL HIGH (ref 0.0–0.2)

## 2020-06-22 LAB — MAGNESIUM: Magnesium: 1.3 mg/dL — ABNORMAL LOW (ref 1.7–2.4)

## 2020-06-22 LAB — SURGICAL PATHOLOGY

## 2020-06-22 LAB — T4, FREE: Free T4: 1.02 ng/dL (ref 0.61–1.12)

## 2020-06-22 MED ORDER — SUCRALFATE 1 GM/10ML PO SUSP: 1.0000 g | Freq: Three times a day (TID) | ORAL | 0 refills | Status: DC

## 2020-06-22 MED ORDER — ENSURE ENLIVE PO LIQD: 237.0000 mL | Freq: Two times a day (BID) | ORAL | 12 refills | Status: DC

## 2020-06-22 MED ORDER — ONDANSETRON HCL 4 MG PO TABS: 4.0000 mg | ORAL_TABLET | Freq: Four times a day (QID) | ORAL | 0 refills | Status: AC | PRN

## 2020-06-22 MED ORDER — MAGNESIUM SULFATE 2 GM/50ML IV SOLN
2.0000 g | Freq: Once | INTRAVENOUS | Status: AC
  Administered 2020-06-22: 2 g via INTRAVENOUS
  Filled 2020-06-22: qty 50

## 2020-06-22 MED ORDER — SUCRALFATE 1 G PO TABS: 1.0000 g | ORAL_TABLET | Freq: Four times a day (QID) | ORAL | 1 refills | Status: AC

## 2020-06-22 MED ORDER — METOPROLOL TARTRATE 25 MG PO TABS
25.0000 mg | ORAL_TABLET | Freq: Two times a day (BID) | ORAL | 3 refills | Status: DC
Start: 2020-06-22 — End: 2020-08-30

## 2020-06-22 MED ORDER — CYANOCOBALAMIN 1000 MCG PO TABS: 1000.0000 ug | ORAL_TABLET | Freq: Every day | ORAL | 2 refills | Status: AC

## 2020-06-22 MED ORDER — FOLIC ACID 1 MG PO TABS: 1.0000 mg | ORAL_TABLET | Freq: Every day | ORAL | 2 refills | Status: AC

## 2020-06-22 MED ORDER — PANTOPRAZOLE SODIUM 40 MG PO TBEC: 40.0000 mg | DELAYED_RELEASE_TABLET | Freq: Two times a day (BID) | ORAL | 0 refills | Status: DC

## 2020-06-22 MED ORDER — MAGNESIUM OXIDE 400 MG PO TABS: 400.0000 mg | ORAL_TABLET | Freq: Two times a day (BID) | ORAL | 2 refills | Status: DC

## 2020-06-22 NOTE — Progress Notes (Signed)
Received a message from MRI Tech. The MRI machine is down, not likely to be able to complete MRI Abdomen/MRCP until next week. I do not think the imaging is so urgent as to justify transportation to [place]. Planned d/c soon with GI follow-up. Can follow-up for MRI/MRCP with Dr. Laural Golden as outpatient.   Thank you for allowing [place] to participate in the care of Satira Sark, DNP, AGNP-C Adult & Gerontological Nurse Practitioner [facility]

## 2020-06-22 NOTE — Telephone Encounter (Signed)
Patient likely d/c in the next 24 hours. Will need 4 week hospital follow-up with Dr. Laural Golden (and repeat EGD 12 weeks).  Thanks!

## 2020-06-22 NOTE — TOC Progression Note (Signed)
Transition of Care [facility]) - Progression Note    Patient Details  Name: [name] MRN: [id number] Date of Birth: [birth year]  [facility] CM/SW Contact  Salome Arnt, [place] Phone Number: 06/22/2020, 2:45 PM  Clinical Narrative:  Per MD, anticipate d/c tomorrow. LCSW called group home and was asked to call [name], group home manager [phone]). LCSW left voicemail notifying her of d/c tomorrow. TOC will continue to follow.      Expected Discharge Plan: Group Home Barriers to Discharge: Continued Medical Work up  Expected Discharge Plan and Services Expected Discharge Plan: Group Home In-house Referral: Clinical Social Work Discharge Planning Services: NA Post Acute Care Choice: NA Living arrangements for the past 2 months: Group Home                 DME Arranged: N/A DME Agency: NA       HH Arranged: NA [place] Agency: NA         Social Determinants of Health (SDOH) Interventions    Readmission Risk Interventions Readmission Risk Prevention Plan 06/21/2020 06/08/2020  Transportation Screening Complete Complete  PCP or Specialist Appt within 5-7 Days - Complete  Home Care Screening - Complete  Medication Review (RN CM) - Complete  HRI or Home Care Consult Complete -  Social Work Consult for [place] Planning/Counseling Complete -  Palliative Care Screening Not Applicable -  Medication Review Press photographer) Complete -  Some recent data might be hidden

## 2020-06-22 NOTE — Progress Notes (Signed)
PROGRESS NOTE    Daniel [name]  [id number] DOB: [birth year] DOA: 06/19/2020 PCP: Leonie Douglas, MD   Brief Narrative:  Daniel [name] y.o.male,with history of hypertension, hyperlipidemia, diabetes mellitus presents to the ED with a chief complaint of epigastric pain. Patient is able to point on his abdomen to tell me where the pain is.He has had multiple ED visits and hospital admissions in the last 3 weeks for different issues. He had a very abnormal abdominal CT scan with dilated esophagus that is fluid-filled with diffuse wall thickening as well as signs of gastritis or peptic ulcer disease noted.   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  Resumed on clear liquid diet.  PPI twice daily as well as Carafate.  Assessment & Plan:   Active Problems:   AKI (acute kidney injury) ([place])   Normochromic normocytic anemia   Upper abdominal pain   Abnormal CT scan, esophagus   Abnormal CT scan, stomach   Dilation of pancreatic duct   Constipation   Abdominal pain   Recurrent abdominal symptoms -Status post EGD 1/13 with noted gastric ulcers as well as moderate severe reflux esophagitis -Appreciate GI evaluation -Plan for diet advancement from clears to full liquid today -PPI twice daily -Carafate  Dilated pancreatic duct -MRCP will now be followed up outpatient  AKI-resolved -Baseline near 1.2 -Continue to monitor  Hypomagnesemia-persistent -Continue repletion -Recheck in am -Increase Mag Ox to BID outpatient  Chronic anemia-downtrending -Noted folate and B12 deficiency for which supplementation has been started -No overt bleeding noted -EGD results as noted above -Repeat CBC in a.m. -Avoid further use of aspirin, Plavix, and heparin agents.  Apparently antiplatelets were stopped by his neurologist months ago.  Moderate protein calorie  malnutrition -Continue on Ensure  Diabetes type 2 -Hyperglycemia improved -Started on clear liquid diet -Maintain on SSI  Low TSH -Plan to check Free T4 -Endocrinology referral outpatient  DVT prophylaxis: SCDs Code Status: Full Family Communication: Discussed with legal guardian 1/14 Disposition Plan:  Status is: Inpatient  Remains inpatient appropriate because:IV treatments appropriate due to intensity of illness or inability to take PO and Inpatient level of care appropriate due to severity of illness   Dispo: The patient is from: Home  Anticipated d/c is to: Home  Anticipated d/c date is: 1 day  Patient currently is not medically stable to d/c.  Currently on clear liquid diet with further test pending.  Advance as tolerated and then will be ready for discharge by am.   Consultants:   GI  Procedures:   EGD with biopsy 1/13  See below  Antimicrobials:   None   Subjective: Patient seen and evaluated today with no new acute complaints or concerns. No acute concerns or events noted overnight. Tolerating clear liquid diet.  Objective: Vitals:   06/21/20 2008 06/21/20 2045 06/22/20 0446 06/22/20 1314  BP: 108/69  100/68 116/79  Pulse: 99  92 87  Resp: 19  18 19   Temp: 98.4 F (36.9 C)  98 F (36.7 C) 98.1 F (36.7 C)  TempSrc:    Oral  SpO2: 97% 99% 95% 100%  Weight:        Intake/Output Summary (Last 24 hours) at 06/22/2020 1617 Last data filed at 06/22/2020 1412 Gross per 24 hour  Intake 840 ml  Output 1300 ml  Net -460 ml   Filed Weights   06/21/20 0600  Weight: 74.5 kg  Examination:  General exam: Appears calm and comfortable, MR Respiratory system: Clear to auscultation. Respiratory effort normal. Cardiovascular system: S1 & S2 heard, RRR.  Gastrointestinal system: Abdomen is soft Central nervous system: Alert and awake Extremities: No edema Skin: No significant lesions noted Psychiatry:  Flat affect.    Data Reviewed: I have personally reviewed following labs and imaging studies  CBC: Recent Labs  Lab 06/16/20 1124 06/19/20 2032 06/20/20 0533 06/21/20 0459 06/22/20 0616  WBC 13.2* 13.3* 10.9* 11.7* 7.5  NEUTROABS  --   --  8.9*  --   --   HGB 12.1* 12.2* 10.3* 9.5* 9.4*  HCT 37.2* 37.7* 31.4* 29.7* 29.9*  MCV 87.9 89.8 87.7 89.5 89.5  PLT 375 406* 367 339 [id number]   Basic Metabolic Panel: Recent Labs  Lab 06/16/20 1124 06/19/20 2032 06/20/20 0533 06/21/20 0459 06/22/20 0616  NA 127* 128* 128* 132* 131*  K 4.8 5.0 4.8 4.0 4.1  CL 91* 93* 96* 100 98  CO2 24 24 25 28 27   GLUCOSE 269* 409* 379* 79 170*  BUN 36* 37* 33* 27* 22  CREATININE 1.45* 1.62* 1.41* 1.24 1.27*  CALCIUM 9.2 10.0 9.3 9.9 10.2  MG 0.9* 1.2* 1.5* 1.4* 1.3*   GFR: Estimated Creatinine Clearance: 59.5 mL/min (A) (by C-G formula based on SCr of 1.27 mg/dL (H)). Liver Function Tests: Recent Labs  Lab 06/16/20 1124 06/19/20 2032 06/20/20 0533 06/21/20 0459 06/22/20 0616  AST 12* 13* 10* 55* 35  ALT 31 22 21  50* 58*  ALKPHOS 52 53 40 70 82  BILITOT 0.9 0.5 0.6 0.5 0.5  PROT 4.9* 5.1* 4.2* 4.0* 4.1*  ALBUMIN 2.4* 2.5* 2.0* 1.8* 1.9*   Recent Labs  Lab 06/19/20 2032  LIPASE 32   No results for input(s): AMMONIA in the last 168 hours. Coagulation Profile: No results for input(s): INR, PROTIME in the last 168 hours. Cardiac Enzymes: No results for input(s): CKTOTAL, CKMB, CKMBINDEX, TROPONINI in the last 168 hours. BNP (last 3 results) No results for input(s): PROBNP in the last 8760 hours. HbA1C: No results for input(s): HGBA1C in the last 72 hours. CBG: Recent Labs  Lab 06/21/20 1334 06/21/20 1546 06/21/20 2149 06/22/20 0747 06/22/20 1101  GLUCAP 99 110* 209* 164* 164*   Lipid Profile: No results for input(s): CHOL, HDL, LDLCALC, TRIG, CHOLHDL, LDLDIRECT in the last 72 hours. Thyroid Function Tests: Recent Labs    06/20/20 0533  TSH 0.312*   Anemia  Panel: Recent Labs    06/20/20 0942  VITAMINB12 173*  FOLATE 5.9*  FERRITIN 109  TIBC 157*  IRON 45   Sepsis Labs: Recent Labs  Lab 06/19/20 2043 06/19/20 2248 06/20/20 0731  LATICACIDVEN 2.5* 2.4* 0.9    Recent Results (from the past 240 hour(s))  Culture, blood (routine x 2)     Status: None (Preliminary result)   Collection Time: 06/19/20 10:48 PM   Specimen: BLOOD RIGHT HAND  Result Value Ref Range Status   Specimen Description BLOOD RIGHT HAND  Final   Special Requests   Final    BOTTLES DRAWN AEROBIC AND ANAEROBIC Blood Culture adequate volume   Culture   Final    NO GROWTH 3 DAYS Performed at [facility], [address], [place],  [address]    Report Status PENDING  Incomplete  Culture, blood (routine x 2)     Status: None (Preliminary result)   Collection Time: 06/19/20 10:58 PM   Specimen: BLOOD LEFT ARM  Result Value Ref Range Status  Specimen Description BLOOD LEFT ARM  Final   Special Requests   Final    BOTTLES DRAWN AEROBIC AND ANAEROBIC Blood Culture adequate volume   Culture   Final    NO GROWTH 3 DAYS Performed at [facility], [address], [place], [place] [address]    Report Status PENDING  Incomplete  Resp Panel by RT-PCR (Flu A&B, Covid) Nasopharyngeal Swab     Status: None   Collection Time: 06/21/20  8:00 AM   Specimen: Nasopharyngeal Swab; Nasopharyngeal(NP) swabs in vial transport medium  Result Value Ref Range Status   SARS Coronavirus 2 by RT PCR NEGATIVE NEGATIVE Final    Comment: (NOTE) SARS-CoV-2 target nucleic acids are NOT DETECTED.  The SARS-CoV-2 RNA is generally detectable in upper respiratory specimens during the acute phase of infection. The lowest concentration of SARS-CoV-2 viral copies this assay can detect is 138 copies/mL. A negative result does not preclude SARS-Cov-2 infection and should not be used as the sole basis for treatment or other patient management decisions. A negative result may occur with   improper specimen collection/handling, submission of specimen other than nasopharyngeal swab, presence of viral mutation(s) within the areas targeted by this assay, and inadequate number of viral copies(<138 copies/mL). A negative result must be combined with clinical observations, patient history, and epidemiological information. The expected result is Negative.  Fact Sheet for Patients:  EntrepreneurPulse.com.au  Fact Sheet for Healthcare Providers:  IncredibleEmployment.be  This test is no t yet approved or cleared by the [place] FDA and  has been authorized for detection and/or diagnosis of SARS-CoV-2 by FDA under an Emergency Use Authorization (EUA). This EUA will remain  in effect (meaning this test can be used) for the duration of the COVID-19 declaration under Section 564(b)(1) of the Act, 21 U.S.C.section 360bbb-3(b)(1), unless the authorization is terminated  or revoked sooner.       Influenza A by PCR NEGATIVE NEGATIVE Final   Influenza B by PCR NEGATIVE NEGATIVE Final    Comment: (NOTE) The Xpert Xpress SARS-CoV-2/FLU/RSV plus assay is intended as an aid in the diagnosis of influenza from Nasopharyngeal swab specimens and should not be used as a sole basis for treatment. Nasal washings and aspirates are unacceptable for Xpert Xpress SARS-CoV-2/FLU/RSV testing.  Fact Sheet for Patients: EntrepreneurPulse.com.au  Fact Sheet for Healthcare Providers: IncredibleEmployment.be  This test is not yet approved or cleared by the [place] FDA and has been authorized for detection and/or diagnosis of SARS-CoV-2 by FDA under an Emergency Use Authorization (EUA). This EUA will remain in effect (meaning this test can be used) for the duration of the COVID-19 declaration under Section 564(b)(1) of the Act, 21 U.S.C. section 360bbb-3(b)(1), unless the authorization is terminated  or revoked.  Performed at [facility], [address], [place], [place] [address]          Radiology Studies: No results found.      Scheduled Meds: . amitriptyline  10 mg Oral QHS  . atorvastatin  40 mg Oral Daily  . clopidogrel  75 mg Oral Daily  . donepezil  10 mg Oral QHS  . feeding supplement  237 mL Oral BID BM  . folic acid  1 mg Oral Daily  . insulin aspart  0-15 Units Subcutaneous TID WC  . insulin aspart  0-5 Units Subcutaneous QHS  . insulin detemir  12 Units Subcutaneous Daily  . memantine  10 mg Oral BID  . metoprolol tartrate  25 mg Oral BID  .  oxybutynin  5 mg Oral Daily  . pantoprazole (PROTONIX) IV  40 mg Intravenous Q12H  . polyethylene glycol  17 g Oral Daily  . sucralfate  1 g Oral TID WC & HS  . tamsulosin  0.4 mg Oral QHS  . traZODone  100 mg Oral QHS   Continuous Infusions: . sodium chloride 75 mL/hr at 06/22/20 1103  . magnesium sulfate bolus IVPB       LOS: 2 days    Time spent: 35 minutes    Chirstine Defrain Darleen Crocker, DO [facility]  If 7PM-7AM, please contact night-coverage [email or web address] 06/22/2020, 4:17 PM

## 2020-06-22 NOTE — Care Management Important Message (Signed)
Important Message  Patient Details  Name: Daniel [name] MRN: [id number] Date of Birth: [birth year]   Medicare Important Message Given:  Yes     [name] 06/22/2020, 12:31 PM

## 2020-06-22 NOTE — Progress Notes (Signed)
Reviewed the chart and discussed with Dr. Manuella Ghazi (hospitalist).  Hemoglobin is stable and has been over the past 24 to 48 hours.  No obvious GI bleed.  EGD completed yesterday which revealed a duodenal mass status postbiopsy with pathology results pending.  It appears MRCP was ordered 2 days ago but at some point it was canceled.  I have reordered this today for evaluation of mildly dilated pancreatic duct.  Anticipate patient discharge in the next 24 hours.  We will sign off for now, please contact us if we can be of further assistance.  Final GI recommendations: 1. PPI bid x 3 months 2. Carafate 1g four times daily 3. Hold ASA, Plavix indefinitiely 4. Restart oral iron for anemia 5. Follow-up with Dr. Laural Golden in 4 weeks 6. Will follow for MRCP results; can address as outpatient if nothing significant 7. Plan repeat EGD 3 months for ulcer healing and EMR of duodenal lesion 8.  GI as outpatient if any interval problems   Thank you for allowing [place] to participate in the care of Daniel Sark, DNP, AGNP-C Adult & Gerontological Nurse Practitioner [facility]

## 2020-06-23 DIAGNOSIS — R109 Unspecified abdominal pain: Secondary | ICD-10-CM | POA: Diagnosis not present

## 2020-06-23 LAB — CBC
HCT: 29.7 % — ABNORMAL LOW (ref 39.0–52.0)
Hemoglobin: 9.5 g/dL — ABNORMAL LOW (ref 13.0–17.0)
MCH: 29 pg (ref 26.0–34.0)
MCHC: 32 g/dL (ref 30.0–36.0)
MCV: 90.5 fL (ref 80.0–100.0)
Platelets: 288 10*3/uL (ref 150–400)
RBC: 3.28 MIL/uL — ABNORMAL LOW (ref 4.22–5.81)
RDW: 15.3 % (ref 11.5–15.5)
WBC: 5.8 10*3/uL (ref 4.0–10.5)
nRBC: 0 % (ref 0.0–0.2)

## 2020-06-23 LAB — COMPREHENSIVE METABOLIC PANEL
ALT: 64 U/L — ABNORMAL HIGH (ref 0–44)
AST: 54 U/L — ABNORMAL HIGH (ref 15–41)
Albumin: 1.7 g/dL — ABNORMAL LOW (ref 3.5–5.0)
Alkaline Phosphatase: 90 U/L (ref 38–126)
Anion gap: 5 (ref 5–15)
BUN: 17 mg/dL (ref 8–23)
CO2: 29 mmol/L (ref 22–32)
Calcium: 9.8 mg/dL (ref 8.9–10.3)
Chloride: 97 mmol/L — ABNORMAL LOW (ref 98–111)
Creatinine, Ser: 1.16 mg/dL (ref 0.61–1.24)
GFR, Estimated: >60 mL/min (ref >60)
Glucose, Bld: 147 mg/dL — ABNORMAL HIGH (ref 70–99)
Potassium: 3.5 mmol/L (ref 3.5–5.1)
Sodium: 131 mmol/L — ABNORMAL LOW (ref 135–145)
Total Bilirubin: 0.5 mg/dL (ref 0.3–1.2)
Total Protein: 4.1 g/dL — ABNORMAL LOW (ref 6.5–8.1)

## 2020-06-23 LAB — GLUCOSE, CAPILLARY
Glucose-Capillary: 159 mg/dL — ABNORMAL HIGH (ref 70–99)
Glucose-Capillary: 168 mg/dL — ABNORMAL HIGH (ref 70–99)
Glucose-Capillary: 238 mg/dL — ABNORMAL HIGH (ref 70–99)

## 2020-06-23 LAB — T3, FREE: T3, Free: 1.2 pg/mL — ABNORMAL LOW (ref 2.0–4.4)

## 2020-06-23 LAB — MAGNESIUM: Magnesium: 1.4 mg/dL — ABNORMAL LOW (ref 1.7–2.4)

## 2020-06-23 MED ORDER — MAGNESIUM SULFATE 2 GM/50ML IV SOLN
2.0000 g | Freq: Once | INTRAVENOUS | Status: AC
  Administered 2020-06-23: 2 g via INTRAVENOUS
  Filled 2020-06-23: qty 50

## 2020-06-23 NOTE — TOC Transition Note (Signed)
Transition of Care [facility]) - CM/SW Discharge Note   Patient Details  Name: Daniel [name] MRN: [id number] Date of Birth: [birth year]  Transition of Care [facility]) CM/SW Contact:  [name], LCSW Phone Number: 06/23/2020, 10:26 AM   Clinical Narrative:    CSW spoke with [name] with [facility] to coordinate patient's discharge. [name] agreeable to take patient on 06/23/2020. [name] confirmed discharge medications with CSW and RN. CSW printed d/c summary and FL2 and requested nurse add it to patient's discharge paperwork. Nurse agreeable. CSW notified [name] of patient's discharge. TOC signing off.   Final next level of care: Group Home Barriers to Discharge: Barriers Resolved   Patient Goals and CMS Choice Patient states their goals for this hospitalization and ongoing recovery are:: Return to Group home CMS Medicare.gov Compare Post Acute Care list provided to:: Patient Choice offered to / list presented to : Patient  Discharge Placement                Patient to be transferred to facility by: [facility] Name of family member notified: [name] Patient and family notified of of transfer: 06/23/20  Discharge Plan and Services In-house Referral: Clinical Social Work Discharge Planning Services: NA Post Acute Care Choice: NA          DME Arranged: N/A DME Agency: NA       HH Arranged: NA [place] Agency: NA        Social Determinants of Health (SDOH) Interventions     Readmission Risk Interventions Readmission Risk Prevention Plan 06/21/2020 06/08/2020  Transportation Screening Complete Complete  PCP or Specialist Appt within 5-7 Days - Complete  Home Care Screening - Complete  Medication Review (RN CM) - Complete  HRI or Home Care Consult Complete -  Social Work Consult for [place] Planning/Counseling Complete -  Palliative Care Screening Not Applicable -  Medication Review Press photographer) Complete -  Some recent data might  be hidden

## 2020-06-23 NOTE — Evaluation (Signed)
Physical Therapy Evaluation Patient Details Name: Daniel [name] MRN: [id number] DOB: 08/15/1950 Today's Date: 06/23/2020   History of Present Illness  Daniel [name]  is a 70 y.o. male, with history of hypertension, hyperlipidemia, diabetes mellitus presents to the ED with a chief complaint of epigastric pain.  Patient is able to point on his abdomen to tell me where the pain is.  He is not sure how to characterize it but describes it as a hurt.  He is not sure how long its been there.  He does note that is completely better now.  He does not know what made it better.  Patient does have a history of cognitive impairment.  Patient has denied any vomiting or diarrhea.  He denies fever and dysuria as well.  Patient denies chest pain at this time.  Unfortunately patient is not able to provide more history than this due to cognitive impairment.    Clinical Impression  Patient demonstrates slow labored movement for sitting up at bedside with most difficulty moving legs due to weakness, at high risk for falls and limited to a few steps at bedside before having to sit due to fatigue and poor standing balance.  Patient put back to bed after therapy.  Patient will benefit from continued physical therapy in hospital and recommended venue below to increase strength, balance, endurance for safe ADLs and gait.     Follow Up Recommendations SNF    Equipment Recommendations       Recommendations for Other Services       Precautions / Restrictions Precautions Precautions: Fall Restrictions Weight Bearing Restrictions: No      Mobility  Bed Mobility Overal bed mobility: Needs Assistance Bed Mobility: Supine to Sit;Sit to Supine     Supine to sit: Mod assist Sit to supine: Min assist   General bed mobility comments: increased time, labored movement    Transfers Overall transfer level: Needs assistance Equipment used: Rolling walker (2 wheeled) Transfers: Sit to/from [employer] Sit to Stand: Mod assist Stand pivot transfers: Mod assist       General transfer comment: very unsteady labored movement  Ambulation/Gait Ambulation/Gait assistance: Mod assist;Max assist Gait Distance (Feet): 4 Feet Assistive device: Rolling walker (2 wheeled);None Gait Pattern/deviations: Decreased step length - left;Decreased stance time - right;Decreased stride length Gait velocity: slow   General Gait Details: unable to maintain standing balance when attempting to ambulate without AD, required RW and limited to 4-5 steps before having to sit due to fatigue/poor standing balance  Stairs            Wheelchair Mobility    Modified Rankin (Stroke Patients Only)       Balance Overall balance assessment: Needs assistance Sitting-balance support: Feet supported;No upper extremity supported Sitting balance-Leahy Scale: Fair Sitting balance - Comments: seated at EOB   Standing balance support: During functional activity;No upper extremity supported Standing balance-Leahy Scale: Poor Standing balance comment: fair/poor using RW                             Pertinent Vitals/Pain Pain Assessment: No/denies pain    Home Living Family/patient expects to be discharged to:: Group home                      Prior Function Level of Independence: Needs assistance   Gait / Transfers Assistance Needed: Household ambulator without AD  ADL's / Homemaking Assistance Needed: Staff  assists as needed with ADLs        Hand Dominance   Dominant Hand: Right    Extremity/Trunk Assessment   Upper Extremity Assessment Upper Extremity Assessment: Generalized weakness    Lower Extremity Assessment Lower Extremity Assessment: Generalized weakness    Cervical / Trunk Assessment Cervical / Trunk Assessment: Normal  Communication   Communication: No difficulties  Cognition Arousal/Alertness: Awake/alert Behavior During Therapy: WFL for tasks  assessed/performed Overall Cognitive Status: History of cognitive impairments - at baseline                                        General Comments      Exercises     Assessment/Plan    PT Assessment Patient needs continued PT services  PT Problem List Decreased strength;Decreased activity tolerance;Decreased balance;Decreased mobility       PT Treatment Interventions DME instruction;Gait training;Stair training;Functional mobility training;Therapeutic activities;Therapeutic exercise;Balance training;Patient/family education    PT Goals (Current goals can be found in the Care Plan section)  Acute Rehab PT Goals Patient Stated Goal: return home PT Goal Formulation: With patient Time For Goal Achievement: 07/07/20 Potential to Achieve Goals: Good    Frequency Min 3X/week   Barriers to discharge        Co-evaluation               AM-PAC PT "6 Clicks" Mobility  Outcome Measure Help needed turning from your back to your side while in a flat bed without using bedrails?: A Little Help needed moving from lying on your back to sitting on the side of a flat bed without using bedrails?: A Lot Help needed moving to and from a bed to a chair (including a wheelchair)?: A Lot Help needed standing up from a chair using your arms (e.g., wheelchair or bedside chair)?: A Lot Help needed to walk in hospital room?: A Lot Help needed climbing 3-5 steps with a railing? : Total 6 Click Score: 12    End of Session   Activity Tolerance: Patient tolerated treatment well;Patient limited by fatigue Patient left: in bed;with call bell/phone within reach;with bed alarm set Nurse Communication: Mobility status PT Visit Diagnosis: Unsteadiness on feet (R26.81);Other abnormalities of gait and mobility (R26.89);Muscle weakness (generalized) (M62.81)    Time: 8676-1950 PT Time Calculation (min) (ACUTE ONLY): 20 min   Charges:   PT Evaluation $PT Eval Moderate Complexity: 1  Mod PT Treatments $Therapeutic Activity: 8-22 mins        12:06 PM, 06/23/20 Daniel [name], MPT Physical Therapist with [facility] 336 [phone] office [phone] mobile phone

## 2020-06-23 NOTE — Progress Notes (Signed)
Patient had negative covid test resulted on 06-21-2020 at 0800. Previous covid test negative was well on 12-20 2021

## 2020-06-23 NOTE — Plan of Care (Signed)
  Problem: Acute Rehab PT Goals(only PT should resolve) Goal: Pt Will Go Supine/Side To Sit Outcome: Progressing Flowsheets (Taken 06/23/2020 1207) Pt will go Supine/Side to Sit: with minimal assist Goal: Patient Will Transfer Sit To/From Stand Outcome: Progressing Flowsheets (Taken 06/23/2020 1207) Patient will transfer sit to/from stand: with minimal assist Goal: Pt Will Transfer Bed To Chair/Chair To Bed Outcome: Progressing Flowsheets (Taken 06/23/2020 1207) Pt will Transfer Bed to Chair/Chair to Bed: with min assist Goal: Pt Will Ambulate Outcome: Progressing Flowsheets (Taken 06/23/2020 1207) Pt will Ambulate:  25 feet  with minimal assist  with moderate assist  with rolling walker   12:08 PM, 06/23/20 Lonell Grandchild, MPT Physical Therapist with [facility] 336 [phone] office [phone] mobile phone

## 2020-06-23 NOTE — TOC Progression Note (Signed)
Transition of Care [facility]) - Progression Note    Patient Details  Name: Daniel [name] MRN: [id number] Date of Birth: 08-15-1950  Transition of [facility]) CM/SW Contact  Daniel Bence, LCSW Phone Number: 06/23/2020, 4:28 PM  Clinical Narrative:    Nurse reported that when patient was brought down to the ED for [facility] to pick up patient, it required 3 people to assist him. [facility] reported that they would not be able to take patient back if he was not able to ambulate or complete ADL's such as bathing and dressing independently as he was able to do prior to his admission. MD notified [name], patient's guardian, of [facility]'s decision. [name] agreeable to SNF placement. CSW completed FL2 with udpated PT needs, submitted for PASSR, and faxed out referrals for SNF. PASSR pending. TOC to follow.   Expected Discharge Plan: Group Home Barriers to Discharge: Barriers Resolved  Expected Discharge Plan and Services Expected Discharge Plan: Group Home In-house Referral: Clinical Social Work Discharge Planning Services: NA Post Acute Care Choice: NA Living arrangements for the past 2 months: Group Home Expected Discharge Date: 06/23/20               DME Arranged: N/A DME Agency: NA       HH Arranged: NA [place] Agency: NA         Social Determinants of Health (SDOH) Interventions    Readmission Risk Interventions Readmission Risk Prevention Plan 06/21/2020 06/08/2020  Transportation Screening Complete Complete  PCP or Specialist Appt within 5-7 Days - Complete  Home Care Screening - Complete  Medication Review (RN CM) - Complete  HRI or Home Care Consult Complete -  Social Work Consult for [place] Planning/Counseling Complete -  Palliative Care Screening Not Applicable -  Medication Review [employer]) Complete -  Some recent data might be hidden

## 2020-06-23 NOTE — NC FL2 (Signed)
[place] LEVEL OF CARE SCREENING TOOL     IDENTIFICATION  Patient Name: Daniel [name] Birthdate: 12-08-1950 Sex: male Admission Date (Current Location): 06/19/2020  [place] and [place] Number:  [place] [facility] [id number] [place] and Address:  [place] [address] [place]      Provider Number: [id number]  Attending Physician Name and Address:  Rodena Goldmann, DO  Relative Name and Phone Number:  [name] (Legal Guardian) Ph: [phone]    Current Level of Care: Hospital Recommended Level of Care: [place] ([facility]) Prior Approval Number:    Date Approved/Denied:   PASRR Number:    Discharge Plan: Domiciliary (Rest home)    Current Diagnoses: Patient Active Problem List   Diagnosis Date Noted  . Abdominal pain 06/20/2020  . Normochromic normocytic anemia   . Upper abdominal pain   . Abnormal CT scan, esophagus   . Abnormal CT scan, stomach   . Dilation of pancreatic duct   . Constipation   . Hyperglycemia   . History of stroke   . Hypokalemia   . Hypomagnesemia   . AKI (acute kidney injury) ([place]) 06/06/2020  . Acute CVA (cerebrovascular accident) ([place]) 01/05/2020  . Right knee pain 01/01/2017  . Osteoarthritis of right knee 01/01/2017  . Mental impairment 09/23/2016  . Hypertension 09/23/2016  . Type 2 diabetes mellitus without complication, without long-term current use of insulin ([place]) 09/23/2016  . BPH (benign prostatic hyperplasia) 09/23/2016  . Dementia ([place]) 09/23/2016  . 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 Yes; Fluid consistency: Thin)  AMBULATORY STATUS COMMUNICATION OF NEEDS Skin   Limited Assist Verbally Normal                        Personal Care Assistance Level of Assistance  Bathing,Feeding,Dressing Bathing Assistance: Limited assistance Feeding assistance: Independent Dressing Assistance: Limited assistance     Functional Limitations Info  Sight,Hearing,Speech Sight Info: Adequate Hearing Info: Adequate Speech Info: Adequate    SPECIAL CARE FACTORS FREQUENCY                       Contractures Contractures Info: Not present    Additional Factors Info  Code Status,Allergies,Psychotropic,Insulin Sliding Scale Code Status Info: Full Allergies Info: NKA Psychotropic Info: Elavil (amitriptyline); Desyrel (trazadone) Insulin Sliding Scale Info: See discharge summary       Current Medications (06/23/2020):  This is the current hospital active medication list Current Facility-Administered Medications  Medication Dose Route Frequency Provider Last Rate Last Admin  . acetaminophen (TYLENOL) tablet 650 mg  650 mg Oral Q6H PRN Zierle-Ghosh, Asia B, DO       Or  . acetaminophen (TYLENOL) suppository 650 mg  650 mg Rectal Q6H PRN Zierle-Ghosh, Asia B, DO      . alum & mag hydroxide-simeth (MAALOX/MYLANTA) 200-200-20 MG/5ML suspension 30 mL  30 mL Oral Q6H PRN Zierle-Ghosh, Asia B, DO      . amitriptyline (ELAVIL) tablet 10 mg  10 mg Oral QHS Zierle-Ghosh, Asia B, DO   10 mg at 06/22/20 2236  . atorvastatin (LIPITOR) tablet 40 mg  40 mg Oral Daily Zierle-Ghosh, Asia B, DO  40 mg at 06/22/20 1059  . clopidogrel (PLAVIX) tablet 75 mg  75 mg Oral Daily Zierle-Ghosh, Asia B, DO   75 mg at 06/22/20 1059  . donepezil (ARICEPT) tablet 10 mg  10 mg Oral QHS Zierle-Ghosh, Asia B, DO   10 mg at 06/22/20 2235  . feeding supplement (ENSURE ENLIVE / ENSURE PLUS) liquid 237 mL  237 mL Oral BID BM Zierle-Ghosh, Asia B, DO      . folic acid (FOLVITE) tablet 1 mg  1 mg Oral Daily Shah, Pratik D, DO   1 mg at 06/22/20 1059  . insulin aspart (novoLOG) injection 0-15 Units  0-15 Units Subcutaneous TID WC  Zierle-Ghosh, Asia B, DO   3 Units at 06/22/20 1133  . insulin aspart (novoLOG) injection 0-5 Units  0-5 Units Subcutaneous QHS Zierle-Ghosh, Asia B, DO   2 Units at 06/21/20 2237  . insulin detemir (LEVEMIR) injection 12 Units  12 Units Subcutaneous Daily Heath Lark D, DO   12 Units at 06/22/20 1132  . magnesium sulfate IVPB 2 g 50 mL  2 g Intravenous Once Manuella Ghazi, Pratik D, DO      . memantine [facility]) tablet 10 mg  10 mg Oral BID Zierle-Ghosh, Asia B, DO   10 mg at 06/22/20 2236  . metoprolol tartrate (LOPRESSOR) injection 5 mg  5 mg Intravenous Q6H PRN Manuella Ghazi, Pratik D, DO      . metoprolol tartrate (LOPRESSOR) tablet 25 mg  25 mg Oral BID Manuella Ghazi, Pratik D, DO   25 mg at 06/22/20 2236  . ondansetron (ZOFRAN) tablet 4 mg  4 mg Oral Q6H PRN Zierle-Ghosh, Asia B, DO       Or  . ondansetron (ZOFRAN) injection 4 mg  4 mg Intravenous Q6H PRN Zierle-Ghosh, Asia B, DO      . oxybutynin (DITROPAN-XL) 24 hr tablet 5 mg  5 mg Oral Daily Zierle-Ghosh, Asia B, DO   5 mg at 06/22/20 1059  . pantoprazole (PROTONIX) injection 40 mg  40 mg Intravenous Q12H Aliene Altes S, PA-C   40 mg at 06/22/20 1059  . polyethylene glycol (MIRALAX / GLYCOLAX) packet 17 g  17 g Oral Daily PRN Zierle-Ghosh, Asia B, DO      . polyethylene glycol (MIRALAX / GLYCOLAX) packet 17 g  17 g Oral Daily Aliene Altes S, PA-C   17 g at 06/22/20 1059  . sucralfate (CARAFATE) 1 GM/10ML suspension 1 g  1 g Oral TID WC & HS Daneil Dolin, MD   1 g at 06/22/20 2235  . tamsulosin (FLOMAX) capsule 0.4 mg  0.4 mg Oral QHS Zierle-Ghosh, Asia B, DO   0.4 mg at 06/22/20 2236  . traZODone (DESYREL) tablet 100 mg  100 mg Oral QHS Zierle-Ghosh, Asia B, DO   100 mg at 06/22/20 2236     Discharge Medications: STOP taking these medications   amLODipine 5 MG tablet Commonly known as: NORVASC   aspirin EC 325 MG tablet   clopidogrel 75 MG tablet Commonly known as: PLAVIX     TAKE these medications   acetaminophen 650 MG CR  tablet Commonly known as: TYLENOL Take 650 mg by mouth 2 (two) times daily.   amitriptyline 10 MG tablet Commonly known as: ELAVIL Take 10 mg by mouth at bedtime.   atorvastatin 40 MG tablet Commonly known as: LIPITOR Take 1 tablet (40 mg total) by mouth daily.   cyanocobalamin 1000 MCG tablet Take 1 tablet (1,000 mcg total) by mouth daily.  donepezil 10 MG tablet Commonly known as: ARICEPT TAKE (1) TABLET BY MOUTH ONCE DAILY. What changed: See the new instructions.   feeding supplement Liqd Take 237 mLs by mouth 2 (two) times daily between meals.   FeroSul 325 (65 FE) MG tablet Generic drug: ferrous sulfate TAKE 1 TABLET BY MOUTH TWICE DAILY. What changed:   how much to take  when to take this   folic acid 1 MG tablet Commonly known as: FOLVITE Take 1 tablet (1 mg total) by mouth daily.   gentian violet 1 % topical solution Apply 1 mL topically See admin instructions. Applied between toes daily   magnesium oxide 400 MG tablet Commonly known as: MAG-OX Take 1 tablet (400 mg total) by mouth 2 (two) times daily. What changed: when to take this   memantine 10 MG tablet Commonly known as: NAMENDA Take 10 mg by mouth 2 (two) times daily.   metFORMIN 1000 MG tablet Commonly known as: GLUCOPHAGE Take 1 tablet (1,000 mg total) by mouth 2 (two) times daily with a meal.   metoprolol tartrate 25 MG tablet Commonly known as: LOPRESSOR Take 1 tablet (25 mg total) by mouth 2 (two) times daily.   ondansetron 4 MG tablet Commonly known as: ZOFRAN Take 1 tablet (4 mg total) by mouth every 6 (six) hours as needed for nausea.   oxybutynin 5 MG 24 hr tablet Commonly known as: DITROPAN-XL Take 5 mg by mouth daily.   Ozempic (0.25 or 0.5 MG/DOSE) 2 MG/1.5ML Sopn Generic drug: Semaglutide(0.25 or 0.5MG /DOS) Inject 0.5 mg into the skin once a week.   pantoprazole 40 MG tablet Commonly known as: Protonix Take 1 tablet (40 mg total) by mouth 2 (two) times  daily.   potassium chloride 10 MEQ tablet Commonly known as: KLOR-CON Take 2 tablets (20 mEq total) by mouth daily. What changed: additional instructions   sucralfate 1 g tablet Commonly known as: Carafate Take 1 tablet (1 g total) by mouth 4 (four) times daily.   tamsulosin 0.4 MG Caps capsule Commonly known as: FLOMAX TAKE 1 CAPSULE BY MOUTH AT BEDTIME.   traZODone 100 MG tablet Commonly known as: DESYREL Take 100 mg by mouth at bedtime.   Vitamin D3 50 MCG (2000 UT) capsule TAKE 1 CAPSULE BY MOUTH ONCE DAILY. **DO NOT CRUSH** What changed: See the new instructions.       Relevant Imaging Results:  Relevant Lab Results:   Additional Information Pt SSN: [id number]  [name], LCSW

## 2020-06-23 NOTE — NC FL2 (Signed)
[place] LEVEL OF CARE SCREENING TOOL     IDENTIFICATION  Patient Name: Daniel [name] Birthdate: 18-Jun-1950 Sex: male Admission Date (Current Location): 06/19/2020  [place] and [place] Number:  [facility] [id number] [place] and Address:  [place] [address] [place]      Provider Number: [id number]  Attending Physician Name and Address:  Rodena Goldmann, DO  Relative Name and Phone Number:  [name] (Legal Guardian) Ph: [phone]    Current Level of Care: Hospital Recommended Level of Care: [place] ([facility]) Prior Approval Number: Pending  Date Approved/Denied:   PASRR Number:    Discharge Plan: Domiciliary (Rest home)    Current Diagnoses: Patient Active Problem List   Diagnosis Date Noted  . Abdominal pain 06/20/2020  . Normochromic normocytic anemia   . Upper abdominal pain   . Abnormal CT scan, esophagus   . Abnormal CT scan, stomach   . Dilation of pancreatic duct   . Constipation   . Hyperglycemia   . History of stroke   . Hypokalemia   . Hypomagnesemia   . AKI (acute kidney injury) ([place]) 06/06/2020  . Acute CVA (cerebrovascular accident) ([employer]) 01/05/2020  . Right knee pain 01/01/2017  . Osteoarthritis of right knee 01/01/2017  . Mental impairment 09/23/2016  . Hypertension 09/23/2016  . Type 2 diabetes mellitus without complication, without long-term current use of insulin ([place]) 09/23/2016  . BPH (benign prostatic hyperplasia) 09/23/2016  . Dementia ([place]) 09/23/2016  . 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 Yes; Fluid consistency: Thin)  AMBULATORY STATUS COMMUNICATION OF NEEDS Skin   Limited Assist Verbally Normal                        Personal Care Assistance Level of Assistance  Bathing,Feeding,Dressing Bathing Assistance: Limited assistance Feeding assistance: Independent Dressing Assistance: Limited assistance     Functional Limitations Info  Sight,Hearing,Speech Sight Info: Adequate Hearing Info: Adequate Speech Info: Adequate    SPECIAL CARE FACTORS FREQUENCY  PT (By licensed PT)     PT Frequency: 5x              Contractures Contractures Info: Not present    Additional Factors Info  Code Status,Allergies,Psychotropic,Insulin Sliding Scale Code Status Info: Full Allergies Info: NKA Psychotropic Info: Elavil (amitriptyline); Desyrel (trazadone) Insulin Sliding Scale Info: See discharge summary       Current Medications (06/23/2020):  This is the current hospital active medication list Current Facility-Administered Medications  Medication Dose Route Frequency Provider Last Rate Last Admin  . acetaminophen (TYLENOL) tablet 650 mg  650 mg Oral Q6H PRN Zierle-Ghosh, Asia B, DO       Or  . acetaminophen (TYLENOL) suppository 650 mg  650 mg Rectal Q6H PRN Zierle-Ghosh, Asia B, DO      . alum & mag hydroxide-simeth (MAALOX/MYLANTA) 200-200-20 MG/5ML suspension 30 mL  30 mL Oral Q6H PRN Zierle-Ghosh, Asia B, DO      . amitriptyline (ELAVIL) tablet 10 mg  10 mg Oral QHS Zierle-Ghosh, Asia B, DO   10 mg at 06/22/20 2236  . atorvastatin (LIPITOR) tablet 40 mg  40 mg Oral Daily Zierle-Ghosh, [place] B,  DO   40 mg at 06/23/20 0928  . clopidogrel (PLAVIX) tablet 75 mg  75 mg Oral Daily Zierle-Ghosh, Asia B, DO   75 mg at 06/23/20 0928  . donepezil (ARICEPT) tablet 10 mg  10 mg Oral QHS Zierle-Ghosh, Asia B, DO   10 mg at 06/22/20 2235  . feeding supplement (ENSURE ENLIVE / ENSURE PLUS) liquid 237 mL  237 mL Oral BID BM Zierle-Ghosh, Asia B, DO   237 mL at 06/23/20 1256  . folic acid (FOLVITE) tablet 1 mg  1 mg Oral Daily Manuella Ghazi, Pratik D, DO   1 mg at 06/23/20 1308  . insulin aspart (novoLOG)  injection 0-15 Units  0-15 Units Subcutaneous TID WC Zierle-Ghosh, Asia B, DO   5 Units at 06/23/20 1255  . insulin aspart (novoLOG) injection 0-5 Units  0-5 Units Subcutaneous QHS Zierle-Ghosh, Asia B, DO   2 Units at 06/21/20 2237  . insulin detemir (LEVEMIR) injection 12 Units  12 Units Subcutaneous Daily Heath Lark D, DO   12 Units at 06/23/20 6578  . memantine (NAMENDA) tablet 10 mg  10 mg Oral BID Zierle-Ghosh, Asia B, DO   10 mg at 06/23/20 0927  . metoprolol tartrate (LOPRESSOR) injection 5 mg  5 mg Intravenous Q6H PRN Manuella Ghazi, Pratik D, DO      . metoprolol tartrate (LOPRESSOR) tablet 25 mg  25 mg Oral BID Manuella Ghazi, Pratik D, DO   25 mg at 06/23/20 4696  . ondansetron (ZOFRAN) tablet 4 mg  4 mg Oral Q6H PRN Zierle-Ghosh, Asia B, DO       Or  . ondansetron (ZOFRAN) injection 4 mg  4 mg Intravenous Q6H PRN Zierle-Ghosh, Asia B, DO      . oxybutynin (DITROPAN-XL) 24 hr tablet 5 mg  5 mg Oral Daily Zierle-Ghosh, Asia B, DO   5 mg at 06/23/20 0927  . pantoprazole (PROTONIX) injection 40 mg  40 mg Intravenous Q12H Erenest Rasher, PA-C   40 mg at 06/23/20 2952  . polyethylene glycol (MIRALAX / GLYCOLAX) packet 17 g  17 g Oral Daily PRN Zierle-Ghosh, Asia B, DO      . polyethylene glycol (MIRALAX / GLYCOLAX) packet 17 g  17 g Oral Daily Erenest Rasher, PA-C   17 g at 06/23/20 0926  . sucralfate (CARAFATE) 1 GM/10ML suspension 1 g  1 g Oral TID WC & HS Rourk, Cristopher Estimable, MD   1 g at 06/23/20 1255  . tamsulosin (FLOMAX) capsule 0.4 mg  0.4 mg Oral QHS Zierle-Ghosh, Asia B, DO   0.4 mg at 06/22/20 2236  . traZODone (DESYREL) tablet 100 mg  100 mg Oral QHS Zierle-Ghosh, Asia B, DO   100 mg at 06/22/20 2236     Discharge Medications: Please see discharge summary for a list of discharge medications.  Relevant Imaging Results:  Relevant Lab Results:   Additional Information Pt SSN: [id number]  [name], LCSW

## 2020-06-23 NOTE — Discharge Summary (Addendum)
Physician Discharge Summary  Daniel [name] [id number] DOB: 05-14-1951 DOA: 06/19/2020  PCP: Leonie Douglas, MD  Admit date: 06/19/2020  Discharge date: 06/26/2020  Admitted From:Group home  Disposition:  Group home  Recommendations for Outpatient Follow-up:  1. Follow up with PCP in 1-2 weeks 2. Follow up with GI Dr. Laural Golden in 4 weeks for follow up and rescheduling of MRCP as needed 3. Follow up with Dr. Dorris Fetch regarding thyroid management and blood glucose management 4. Continue on Carafate as prescribed along with PPI twice daily for the next 3 months 5. Continue magnesium oxide now twice a day 6. Remain off all antiplatelet agents to include Plavix and aspirin  Home Health:None  Equipment/Devices:None  Discharge Condition:Stable  CODE STATUS: Full  Diet recommendation: Heart Healthy/soft  Brief/Interim Summary: JamesMooreis a69 y.o.male,with history of hypertension, hyperlipidemia, diabetes mellitus presents to the ED with a chief complaint of epigastric pain. Patient is able to point on his abdomen to tell me where the pain is.He has had multiple ED visits and hospital admissions in the last 3 weeks for different issues. 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 Resumed on clear liquid diet. PPI twice daily as well as Carafate.  Patient was also noted to have an incidental finding of a dilated pancreatic duct and was supposed to have MRCP during this inpatient stay, however the MRI machine was nonfunctional.  Plan is now to follow-up in the outpatient setting as noted above and consider further imaging as needed.  He is to continue on PPI twice daily for the next 3 months as well as  Carafate to encourage healing of his gastric ulcers and esophagitis.  His magnesium continues to remain low and he will need to remain on magnesium supplementation now twice daily. Additionally, he has some abnormal thyroid function tests and difficulty with managing his DM2 for which he will now be referred to endocrinology for follow up. No other acute events noted during this stay and he is stable for discharge.  -Patient was kept a few extra days due to placement issues he did have 1 bloody bowel movement.  His hemoglobin levels have remained stable and this was attributed to the fact that he was on Plavix for several days after the endoscopy.  This will now be discontinued indefinitely along with aspirin.  Patient continues to require magnesium supplementation which has been increased in dose prior to discharge.   Discharge Diagnoses:  Active Problems:   AKI (acute kidney injury) ([place])   Normochromic normocytic anemia   Upper abdominal pain   Abnormal CT scan, esophagus   Abnormal CT scan, stomach   Dilation of pancreatic duct   Constipation   Abdominal pain  Principal discharge diagnosis: Recurrent abdominal symptoms related to gastric ulcers and moderate to severe reflux esophagitis.  Discharge Instructions  Discharge Instructions    Ambulatory referral to Endocrinology   Complete by: As directed    Diet - low sodium heart healthy   Complete by: As directed    Diet - low sodium heart healthy   Complete by: As directed    Increase activity slowly   Complete by: As directed    Increase activity slowly   Complete by: As directed  Allergies as of 06/26/2020   No Known Allergies     Medication List    STOP taking these medications   amLODipine 5 MG tablet Commonly known as: NORVASC   aspirin EC 325 MG tablet   clopidogrel 75 MG tablet Commonly known as: PLAVIX     TAKE these medications   acetaminophen 650 MG CR tablet Commonly known as: TYLENOL Take 650 mg by  mouth 2 (two) times daily.   amitriptyline 10 MG tablet Commonly known as: ELAVIL Take 10 mg by mouth at bedtime.   atorvastatin 40 MG tablet Commonly known as: LIPITOR Take 1 tablet (40 mg total) by mouth daily.   cyanocobalamin 1000 MCG tablet Take 1 tablet (1,000 mcg total) by mouth daily.   donepezil 10 MG tablet Commonly known as: ARICEPT TAKE (1) TABLET BY MOUTH ONCE DAILY. What changed: See the new instructions.   feeding supplement Liqd Take 237 mLs by mouth 2 (two) times daily between meals.   FeroSul 325 (65 FE) MG tablet Generic drug: ferrous sulfate TAKE 1 TABLET BY MOUTH TWICE DAILY. What changed:   how much to take  when to take this   folic acid 1 MG tablet Commonly known as: FOLVITE Take 1 tablet (1 mg total) by mouth daily.   gentian violet 1 % topical solution Apply 1 mL topically See admin instructions. Applied between toes daily   magnesium oxide 400 MG tablet Commonly known as: MAG-OX Take 2 tablets (800 mg total) by mouth 2 (two) times daily. What changed:   how much to take  when to take this   memantine 10 MG tablet Commonly known as: NAMENDA Take 10 mg by mouth 2 (two) times daily.   metFORMIN 1000 MG tablet Commonly known as: GLUCOPHAGE Take 1 tablet (1,000 mg total) by mouth 2 (two) times daily with a meal.   metoprolol tartrate 25 MG tablet Commonly known as: LOPRESSOR Take 1 tablet (25 mg total) by mouth 2 (two) times daily.   ondansetron 4 MG tablet Commonly known as: ZOFRAN Take 1 tablet (4 mg total) by mouth every 6 (six) hours as needed for nausea.   oxybutynin 5 MG 24 hr tablet Commonly known as: DITROPAN-XL Take 5 mg by mouth daily.   Ozempic (0.25 or 0.5 MG/DOSE) 2 MG/1.5ML Sopn Generic drug: Semaglutide(0.25 or 0.5MG /DOS) Inject 0.5 mg into the skin once a week.   pantoprazole 40 MG tablet Commonly known as: Protonix Take 1 tablet (40 mg total) by mouth 2 (two) times daily.   potassium chloride 10 MEQ  tablet Commonly known as: KLOR-CON Take 2 tablets (20 mEq total) by mouth daily. What changed: additional instructions   sucralfate 1 g tablet Commonly known as: Carafate Take 1 tablet (1 g total) by mouth 4 (four) times daily.   tamsulosin 0.4 MG Caps capsule Commonly known as: FLOMAX TAKE 1 CAPSULE BY MOUTH AT BEDTIME.   traZODone 100 MG tablet Commonly known as: DESYREL Take 100 mg by mouth at bedtime.   Vitamin D3 50 MCG (2000 UT) capsule TAKE 1 CAPSULE BY MOUTH ONCE DAILY. **DO NOT CRUSH** What changed: See the new instructions.       Contact information for follow-up providers    Leonie Douglas, MD Follow up in 1 week(s).   Specialties: Family Medicine, Sports Medicine Contact information: [address] [place] [address] [phone]        Rogene Houston, MD Follow up in 4 week(s).   Specialty: Gastroenterology Contact information:  [address], [address] [place] [address] [phone]        Cassandria Anger, MD Follow up in 2 week(s).   Specialty: Endocrinology Contact information: [place] [address] [phone]            Contact information for after-discharge care    [facility] .   Service: Skilled Nursing Contact information: [address] [place] [phone]                 No Known Allergies  Consultations:  GI   Procedures/Studies: DG Chest 2 View  Result Date: 06/09/2020 CLINICAL DATA:  70 year old male with history of intermittent chest pain. EXAM: CHEST - 2 VIEW COMPARISON:  No priors. FINDINGS: Lung volumes are low. No consolidative airspace disease. No pleural effusions. No pneumothorax. No pulmonary nodule or mass noted. Pulmonary vasculature is normal. The patient is rotated to the right on today's exam, resulting in distortion of the mediastinal contours and reduced diagnostic sensitivity and specificity  for mediastinal pathology. IMPRESSION: 1. Low lung volumes without radiographic evidence of acute cardiopulmonary disease. Electronically Signed   By: Vinnie Langton M.D.   On: 06/09/2020 14:51   CT Abdomen Pelvis W Contrast  Result Date: 06/19/2020 CLINICAL DATA:  Epigastric pain. EXAM: CT ABDOMEN AND PELVIS WITH CONTRAST TECHNIQUE: Multidetector CT imaging of the abdomen and pelvis was performed using the standard protocol following bolus administration of intravenous contrast. CONTRAST:  150mL OMNIPAQUE IOHEXOL 300 MG/ML  SOLN COMPARISON:  None. FINDINGS: Lower chest: Right basilar opacity slightly greater than typically seen with atelectasis. Small right pleural effusion. Distal esophagus is dilated, fluid-filled, with diffuse wall thickening. Hepatobiliary: Motion artifact through the liver and gallbladder. Allowing for this, no focal liver abnormality is seen. Mild diffusely decreased hepatic density typical of steatosis. No abnormal gallbladder distention or calcified gallstone. There is no biliary dilatation. Pancreas: Pancreas appears slightly truncated. Slight prominence of the proximal pancreatic duct at 3 mm. No peripancreatic fat stranding. Spleen: Normal in size without focal abnormality. Adrenals/Urinary Tract: No adrenal nodule. No normal left is seen. There may be a diminutive markedly atrophic kidney seen in the left renal fossa, series 2, image 30. There is no right hydronephrosis. Multiple cysts as well as low-density lesions that are too small to characterize throughout the right renal parenchyma. There is no right perinephric edema. No visualized renal calculi. Mild bladder distension without wall thickening. Stomach/Bowel: Patulous distal esophagus with wall thickening and intraluminal fluid. There is mild wall thickening at the gastroesophageal junction. Stomach is distended with intraluminal fluid. Equivocal pre pyloric gastric wall thickening. Normal positioning of the duodenum and  ligament of Treitz. There is no small bowel obstruction or inflammatory change. High-riding cecum in the right upper quadrant. The appendix is tentatively visualized, series 2, image 33, likely normal but partially obscured by motion. No periappendiceal fat stranding. Colonic tortuosity with moderate volume of colonic stool. Moderate to large stool primarily in the left colon with stool distending the rectum, rectal distention of 7 cm. No colonic or rectal wall thickening. Vascular/Lymphatic: Tortuous abdominal aorta with mild atherosclerosis. Patent portal vein. No acute vascular findings. No enlarged lymph nodes in the abdomen or pelvis. Reproductive: Prostate grossly normal, streak artifact from left hip arthroplasty obscures assessment. Other: No ascites or free air. Small fat containing umbilical hernia. 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 Complete L5-S1  disc space loss with associated facet hypertrophy. There is additional mild multilevel degenerative change in the spine. Left hip arthroplasty. Right hip osteoarthritis. IMPRESSION: 1. Patulous distal esophagus with wall thickening and intraluminal fluid, suggesting reflux or esophagitis. 2. Fluid-filled stomach with equivocal pre pyloric gastric wall thickening, can be seen with gastritis or peptic ulcer disease. 3. Moderate to large volume of colonic stool with stool distending the rectum, suggesting constipation. 4. Solitary right kidney with multiple cysts. There may be a diminutive markedly atrophic left kidney, but this is questionable. 5. Mild hepatic steatosis. 6. 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 Aortic Atherosclerosis (ICD10-I70.0). Electronically Signed   By: Keith Rake M.D.   On: 06/19/2020 23:45   MR ABDOMEN MRCP WO CONTRAST  Result Date: 06/25/2020 CLINICAL DATA:  upper abdominal pain. Abnormal CT with prominent pancreatic  duct. EXAM: MRI ABDOMEN WITHOUT CONTRAST  (INCLUDING MRCP) TECHNIQUE: Multiplanar multisequence MR imaging of the abdomen was performed. Heavily T2-weighted images of the biliary and pancreatic ducts were obtained, and three-dimensional MRCP images were rendered by post processing. COMPARISON:  06/19/2020 CT FINDINGS: Exam is moderately degraded. Postcontrast imaging and repeat series could not be performed. Per technologist comments, the patient cannot communicate. Exam is nearly nondiagnostic. Lower chest: Small bilateral pleural effusions. Mild cardiomegaly. Bilateral gynecomastia Hepatobiliary: Normal noncontrast appearance of the liver. Grossly normal gallbladder. No biliary duct dilatation or choledocholithiasis. Pancreas: Suboptimally evaluated. Developmental absence or atrophy of the pancreatic tail is more apparent on the recent CT. No pancreatic duct dilatation. No gross peripancreatic edema. Spleen:  Grossly normal spleen. Adrenals/Urinary Tract: Left kidney not identified, developmentally or surgically absent. Multiple T2 hyperintense right renal lesions are likely cysts. No hydronephrosis. Stomach/Bowel: stomach not well evaluated. No gross residual wall thickening within the distal gastric wall thickening. Normal caliber of abdominal bowel loops. Vascular/Lymphatic: Normal aortic caliber. No abdominal adenopathy. Other:  Trace abdominal ascites. Musculoskeletal: No acute osseous abnormality. IMPRESSION: 1. Moderate to severely degraded, nearly nondiagnostic exam secondary to multiple factors as detailed above. 2. Pancreas not well evaluated. Likely developmental absence or atrophy of the pancreatic tail. 3. Grossly normal gallbladder and biliary tract. 4. Small bilateral pleural effusions and trace abdominal ascites. 5. Left kidney not identified. 6. Bilateral gynecomastia. Electronically Signed   By: Abigail Miyamoto M.D.   On: 06/25/2020 15:06   MR 3D Recon At Scanner  Result Date:  06/25/2020 CLINICAL DATA:  upper abdominal pain. Abnormal CT with prominent pancreatic duct. EXAM: MRI ABDOMEN WITHOUT CONTRAST  (INCLUDING MRCP) TECHNIQUE: Multiplanar multisequence MR imaging of the abdomen was performed. Heavily T2-weighted images of the biliary and pancreatic ducts were obtained, and three-dimensional MRCP images were rendered by post processing. COMPARISON:  06/19/2020 CT FINDINGS: Exam is moderately degraded. Postcontrast imaging and repeat series could not be performed. Per technologist comments, the patient cannot communicate. Exam is nearly nondiagnostic. Lower chest: Small bilateral pleural effusions. Mild cardiomegaly. Bilateral gynecomastia Hepatobiliary: Normal noncontrast appearance of the liver. Grossly normal gallbladder. No biliary duct dilatation or choledocholithiasis. Pancreas: Suboptimally evaluated. Developmental absence or atrophy of the pancreatic tail is more apparent on the recent CT. No pancreatic duct dilatation. No gross peripancreatic edema. Spleen:  Grossly normal spleen. Adrenals/Urinary Tract: Left kidney not identified, developmentally or surgically absent. Multiple T2 hyperintense right renal lesions are likely cysts. No hydronephrosis. Stomach/Bowel: stomach not well evaluated. No gross residual wall thickening within the distal gastric wall thickening. Normal caliber of abdominal bowel loops. Vascular/Lymphatic: Normal aortic caliber. No abdominal  adenopathy. Other:  Trace abdominal ascites. Musculoskeletal: No acute osseous abnormality. IMPRESSION: 1. Moderate to severely degraded, nearly nondiagnostic exam secondary to multiple factors as detailed above. 2. Pancreas not well evaluated. Likely developmental absence or atrophy of the pancreatic tail. 3. Grossly normal gallbladder and biliary tract. 4. Small bilateral pleural effusions and trace abdominal ascites. 5. Left kidney not identified. 6. Bilateral gynecomastia. Electronically Signed   By: Abigail Miyamoto  M.D.   On: 06/25/2020 15:06   DG Chest Port 1 View  Result Date: 06/19/2020 CLINICAL DATA:  Chest pain EXAM: PORTABLE CHEST 1 VIEW COMPARISON:  06/16/2020 FINDINGS: Low lung volumes with minimal atelectasis at the right base. No consolidation or effusion. Stable cardiomediastinal silhouette. No pneumothorax IMPRESSION: Low lung volumes with minimal right basilar atelectasis. Electronically Signed   By: Donavan Foil M.D.   On: 06/19/2020 22:15   DG Chest Port 1 View  Result Date: 06/16/2020 CLINICAL DATA:  70 year old male with hypotension. EXAM: PORTABLE CHEST 1 VIEW COMPARISON:  06/09/2020 FINDINGS: The heart size and mediastinal contours are within normal limits. Similar appearing low lung volumes. Mild bibasilar subsegmental atelectasis. The visualized skeletal structures are unremarkable. IMPRESSION: Similar appearing low lung volumes with mild bibasilar subsegmental atelectasis. Electronically Signed   By: Ruthann Cancer MD   On: 06/16/2020 11:41     Discharge Exam: Vitals:   06/25/20 2101 06/26/20 0550  BP: 110/72 101/66  Pulse: 97 83  Resp: 18 18  Temp: 98.8 F (37.1 C) 98.4 F (36.9 C)  SpO2: 99% 93%   Vitals:   06/25/20 1438 06/25/20 1957 06/25/20 2101 06/26/20 0550  BP: 108/71  110/72 101/66  Pulse: 83  97 83  Resp: 18  18 18   Temp: 98.2 F (36.8 C)  98.8 F (37.1 C) 98.4 F (36.9 C)  TempSrc: Oral     SpO2: 97% 92% 99% 93%  Weight:    78 kg    General: Pt is alert, awake, not in acute distress Cardiovascular: RRR, S1/S2 +, no rubs, no gallops Respiratory: CTA bilaterally, no wheezing, no rhonchi Abdominal: Soft, NT, ND, bowel sounds + Extremities: no edema, no cyanosis    The results of significant diagnostics from this hospitalization (including imaging, microbiology, ancillary and laboratory) are listed below for reference.     Microbiology: Recent Results (from the past 240 hour(s))  Culture, blood (routine x 2)     Status: None   Collection Time:  06/19/20 10:48 PM   Specimen: BLOOD RIGHT HAND  Result Value Ref Range Status   Specimen Description BLOOD RIGHT HAND  Final   Special Requests   Final    BOTTLES DRAWN AEROBIC AND ANAEROBIC Blood Culture adequate volume   Culture   Final    NO GROWTH 5 DAYS Performed at [facility], [address], [place], [place] [address]    Report Status 06/24/2020 FINAL  Final  Culture, blood (routine x 2)     Status: None   Collection Time: 06/19/20 10:58 PM   Specimen: BLOOD LEFT ARM  Result Value Ref Range Status   Specimen Description BLOOD LEFT ARM  Final   Special Requests   Final    BOTTLES DRAWN AEROBIC AND ANAEROBIC Blood Culture adequate volume   Culture   Final    NO GROWTH 5 DAYS Performed at [facility], [address] [place], [place] [address]    Report Status 06/24/2020 FINAL  Final  Resp Panel by RT-PCR (Flu A&B, Covid) Nasopharyngeal Swab  Status: None   Collection Time: 06/21/20  8:00 AM   Specimen: Nasopharyngeal Swab; Nasopharyngeal(NP) swabs in vial transport medium  Result Value Ref Range Status   SARS Coronavirus 2 by RT PCR NEGATIVE NEGATIVE Final    Comment: (NOTE) SARS-CoV-2 target nucleic acids are NOT DETECTED.  The SARS-CoV-2 RNA is generally detectable in upper respiratory specimens during the acute phase of infection. The lowest concentration of SARS-CoV-2 viral copies this assay can detect is 138 copies/mL. A negative result does not preclude SARS-Cov-2 infection and should not be used as the sole basis for treatment or other patient management decisions. A negative result may occur with  improper specimen collection/handling, submission of specimen other than nasopharyngeal swab, presence of viral mutation(s) within the areas targeted by this assay, and inadequate number of viral copies(<138 copies/mL). A negative result must be combined with clinical observations, patient history, and epidemiological information. The expected result is  Negative.  Fact Sheet for Patients:  EntrepreneurPulse.com.au  Fact Sheet for Healthcare Providers:  IncredibleEmployment.be  This test is no t yet approved or cleared by the [place] FDA and  has been authorized for detection and/or diagnosis of SARS-CoV-2 by FDA under an Emergency Use Authorization (EUA). This EUA will remain  in effect (meaning this test can be used) for the duration of the COVID-19 declaration under Section 564(b)(1) of the Act, 21 U.S.C.section 360bbb-3(b)(1), unless the authorization is terminated  or revoked sooner.       Influenza A by PCR NEGATIVE NEGATIVE Final   Influenza B by PCR NEGATIVE NEGATIVE Final    Comment: (NOTE) The Xpert Xpress SARS-CoV-2/FLU/RSV plus assay is intended as an aid in the diagnosis of influenza from Nasopharyngeal swab specimens and should not be used as a sole basis for treatment. Nasal washings and aspirates are unacceptable for Xpert Xpress SARS-CoV-2/FLU/RSV testing.  Fact Sheet for Patients: EntrepreneurPulse.com.au  Fact Sheet for Healthcare Providers: IncredibleEmployment.be  This test is not yet approved or cleared by the [place] FDA and has been authorized for detection and/or diagnosis of SARS-CoV-2 by FDA under an Emergency Use Authorization (EUA). This EUA will remain in effect (meaning this test can be used) for the duration of the COVID-19 declaration under Section 564(b)(1) of the Act, 21 U.S.C. section 360bbb-3(b)(1), unless the authorization is terminated or revoked.  Performed at [facility], [address], [place], [place] [address]   SARS CORONAVIRUS 2 (TAT 6-24 HRS) Nasopharyngeal Nasopharyngeal Swab     Status: None   Collection Time: 06/25/20 10:24 AM   Specimen: Nasopharyngeal Swab  Result Value Ref Range Status   SARS Coronavirus 2 NEGATIVE NEGATIVE Final    Comment: (NOTE) SARS-CoV-2 target nucleic acids are  NOT DETECTED.  The SARS-CoV-2 RNA is generally detectable in upper and lower respiratory specimens during the acute phase of infection. Negative results do not preclude SARS-CoV-2 infection, do not rule out co-infections with other pathogens, and should not be used as the sole basis for treatment or other patient management decisions. Negative results must be combined with clinical observations, patient history, and epidemiological information. The expected result is Negative.  Fact Sheet for Patients: SugarRoll.be  Fact Sheet for Healthcare Providers: https://www.woods-mathews.com/  This test is not yet approved or cleared by the [place] FDA and  has been authorized for detection and/or diagnosis of SARS-CoV-2 by FDA under an Emergency Use Authorization (EUA). This EUA will remain  in effect (meaning this test can be used) for the duration of the COVID-19 declaration under  Se ction 564(b)(1) of the Act, 21 U.S.C. section 360bbb-3(b)(1), unless the authorization is terminated or revoked sooner.  Performed at [facility], [place] [address] [place], [place] [address]      Labs: BNP (last 3 results) No results for input(s): BNP in the last 8760 hours. Basic Metabolic Panel: Recent Labs  Lab 06/22/20 0616 06/23/20 0615 06/24/20 0500 06/25/20 0500 06/26/20 0600  NA 131* 131* 130* 131* 130*  K 4.1 3.5 3.4* 3.6 3.7  CL 98 97* 93* 96* 94*  CO2 27 29 31 29 30   GLUCOSE 170* 147* 82 94 98  BUN 22 17 13 17 14   CREATININE 1.27* 1.16 1.05 1.03 1.03  CALCIUM 10.2 9.8 10.1 9.3 9.9  MG 1.3* 1.4* 1.1* 1.4* 1.4*   Liver Function Tests: Recent Labs  Lab 06/19/20 2032 06/20/20 0533 06/21/20 0459 06/22/20 0616 06/23/20 0615  AST 13* 10* 55* 35 54*  ALT 22 21 50* 58* 64*  ALKPHOS 53 40 70 82 90  BILITOT 0.5 0.6 0.5 0.5 0.5  PROT 5.1* 4.2* 4.0* 4.1* 4.1*  ALBUMIN 2.5* 2.0* 1.8* 1.9* 1.7*   Recent Labs  Lab 06/19/20 2032   LIPASE 32   No results for input(s): AMMONIA in the last 168 hours. CBC: Recent Labs  Lab 06/20/20 0533 06/21/20 0459 06/22/20 0616 06/23/20 0615 06/25/20 0500 06/26/20 0600  WBC 10.9* 11.7* 7.5 5.8 4.8 5.2  NEUTROABS 8.9*  --   --   --   --   --   HGB 10.3* 9.5* 9.4* 9.5* 8.9* 9.4*  HCT 31.4* 29.7* 29.9* 29.7* 27.3* 28.7*  MCV 87.7 89.5 89.5 90.5 88.6 88.6  PLT 367 339 303 288 268 279   Cardiac Enzymes: No results for input(s): CKTOTAL, CKMB, CKMBINDEX, TROPONINI in the last 168 hours. BNP: Invalid input(s): POCBNP CBG: Recent Labs  Lab 06/24/20 1645 06/24/20 2154 06/25/20 0745 06/25/20 1117 06/26/20 0727  GLUCAP 134* 99 80 136* 93   D-Dimer No results for input(s): DDIMER in the last 72 hours. Hgb A1c No results for input(s): HGBA1C in the last 72 hours. Lipid Profile No results for input(s): CHOL, HDL, LDLCALC, TRIG, CHOLHDL, LDLDIRECT in the last 72 hours. Thyroid function studies No results for input(s): TSH, T4TOTAL, T3FREE, THYROIDAB in the last 72 hours.  Invalid input(s): FREET3 Anemia work up No results for input(s): VITAMINB12, FOLATE, FERRITIN, TIBC, IRON, RETICCTPCT in the last 72 hours. Urinalysis    Component Value Date/Time   COLORURINE YELLOW 06/20/2020 0200   APPEARANCEUR CLEAR 06/20/2020 0200   LABSPEC 1.030 06/20/2020 0200   PHURINE 5.0 06/20/2020 0200   GLUCOSEU >=500 (A) 06/20/2020 0200   HGBUR NEGATIVE 06/20/2020 0200   BILIRUBINUR NEGATIVE 06/20/2020 0200   KETONESUR 5 (A) 06/20/2020 0200   PROTEINUR NEGATIVE 06/20/2020 0200   NITRITE NEGATIVE 06/20/2020 0200   LEUKOCYTESUR NEGATIVE 06/20/2020 0200   Sepsis Labs Invalid input(s): PROCALCITONIN,  WBC,  LACTICIDVEN Microbiology Recent Results (from the past 240 hour(s))  Culture, blood (routine x 2)     Status: None   Collection Time: 06/19/20 10:48 PM   Specimen: BLOOD RIGHT HAND  Result Value Ref Range Status   Specimen Description BLOOD RIGHT HAND  Final   Special  Requests   Final    BOTTLES DRAWN AEROBIC AND ANAEROBIC Blood Culture adequate volume   Culture   Final    NO GROWTH 5 DAYS Performed at [facility], [address], [place], [place] [address]    Report Status 06/24/2020 FINAL  Final  Culture, blood (routine x 2)     Status: None   Collection Time: 06/19/20 10:58 PM   Specimen: BLOOD LEFT ARM  Result Value Ref Range Status   Specimen Description BLOOD LEFT ARM  Final   Special Requests   Final    BOTTLES DRAWN AEROBIC AND ANAEROBIC Blood Culture adequate volume   Culture   Final    NO GROWTH 5 DAYS Performed at [facility], [address] [place], [place] [address]    Report Status 06/24/2020 FINAL  Final  Resp Panel by RT-PCR (Flu A&B, Covid) Nasopharyngeal Swab     Status: None   Collection Time: 06/21/20  8:00 AM   Specimen: Nasopharyngeal Swab; Nasopharyngeal(NP) swabs in vial transport medium  Result Value Ref Range Status   SARS Coronavirus 2 by RT PCR NEGATIVE NEGATIVE Final    Comment: (NOTE) SARS-CoV-2 target nucleic acids are NOT DETECTED.  The SARS-CoV-2 RNA is generally detectable in upper respiratory specimens during the acute phase of infection. The lowest concentration of SARS-CoV-2 viral copies this assay can detect is 138 copies/mL. A negative result does not preclude SARS-Cov-2 infection and should not be used as the sole basis for treatment or other patient management decisions. A negative result may occur with  improper specimen collection/handling, submission of specimen other than nasopharyngeal swab, presence of viral mutation(s) within the areas targeted by this assay, and inadequate number of viral copies(<138 copies/mL). A negative result must be combined with clinical observations, patient history, and epidemiological information. The expected result is Negative.  Fact Sheet for Patients:  BloggerCourse.comhttps://www.fda.gov/media/152166/download  Fact Sheet for Healthcare Providers:   SeriousBroker.ithttps://www.fda.gov/media/152162/download  This test is no t yet approved or cleared by the [place] FDA and  has been authorized for detection and/or diagnosis of SARS-CoV-2 by FDA under an Emergency Use Authorization (EUA). This EUA will remain  in effect (meaning this test can be used) for the duration of the COVID-19 declaration under Section 564(b)(1) of the Act, 21 U.S.C.section 360bbb-3(b)(1), unless the authorization is terminated  or revoked sooner.       Influenza A by PCR NEGATIVE NEGATIVE Final   Influenza B by PCR NEGATIVE NEGATIVE Final    Comment: (NOTE) The Xpert Xpress SARS-CoV-2/FLU/RSV plus assay is intended as an aid in the diagnosis of influenza from Nasopharyngeal swab specimens and should not be used as a sole basis for treatment. Nasal washings and aspirates are unacceptable for Xpert Xpress SARS-CoV-2/FLU/RSV testing.  Fact Sheet for Patients: BloggerCourse.comhttps://www.fda.gov/media/152166/download  Fact Sheet for Healthcare Providers: SeriousBroker.ithttps://www.fda.gov/media/152162/download  This test is not yet approved or cleared by the [place] FDA and has been authorized for detection and/or diagnosis of SARS-CoV-2 by FDA under an Emergency Use Authorization (EUA). This EUA will remain in effect (meaning this test can be used) for the duration of the COVID-19 declaration under Section 564(b)(1) of the Act, 21 U.S.C. section 360bbb-3(b)(1), unless the authorization is terminated or revoked.  Performed at [facility], [address] [place], [place] [address]   SARS CORONAVIRUS 2 (TAT 6-24 HRS) Nasopharyngeal Nasopharyngeal Swab     Status: None   Collection Time: 06/25/20 10:24 AM   Specimen: Nasopharyngeal Swab  Result Value Ref Range Status   SARS Coronavirus 2 NEGATIVE NEGATIVE Final    Comment: (NOTE) SARS-CoV-2 target nucleic acids are NOT DETECTED.  The SARS-CoV-2 RNA is generally detectable in upper and lower respiratory specimens during the acute  phase of infection. Negative results do not preclude SARS-CoV-2 infection, do not  rule out co-infections with other pathogens, and should not be used as the sole basis for treatment or other patient management decisions. Negative results must be combined with clinical observations, patient history, and epidemiological information. The expected result is Negative.  Fact Sheet for Patients: SugarRoll.be  Fact Sheet for Healthcare Providers: https://www.woods-mathews.com/  This test is not yet approved or cleared by the [place] FDA and  has been authorized for detection and/or diagnosis of SARS-CoV-2 by FDA under an Emergency Use Authorization (EUA). This EUA will remain  in effect (meaning this test can be used) for the duration of the COVID-19 declaration under Se ction 564(b)(1) of the Act, 21 U.S.C. section 360bbb-3(b)(1), unless the authorization is terminated or revoked sooner.  Performed at [facility], [place] [address] [place], [place] [address]      Time coordinating discharge: 35 minutes  SIGNED:   Rodena Goldmann, DO [facility] 06/26/2020, 10:40 AM  If 7PM-7AM, please contact night-coverage [email or web address]

## 2020-06-23 NOTE — NC FL2 (Deleted)
[place] LEVEL OF CARE SCREENING TOOL     IDENTIFICATION  Patient Name: Daniel [name] Birthdate: [birth year] Sex: male Admission Date (Current Location): 06/19/2020  [place] and [place] Number:  [place] [id number] [place] and Address:  [place] [address], [place]      Provider Number: [id number]  Attending Physician Name and Address:  Rodena Goldmann, DO  Relative Name and Phone Number:  [name] (Legal Guardian) Ph: [phone]    Current Level of Care: Hospital Recommended Level of Care: [place] ([facility]) Prior Approval Number:    Date Approved/Denied:   PASRR Number:    Discharge Plan: Domiciliary (Rest home)    Current Diagnoses: Patient Active Problem List   Diagnosis Date Noted  . Abdominal pain 06/20/2020  . Normochromic normocytic anemia   . Upper abdominal pain   . Abnormal CT scan, esophagus   . Abnormal CT scan, stomach   . Dilation of pancreatic duct   . Constipation   . Hyperglycemia   . History of stroke   . Hypokalemia   . Hypomagnesemia   . AKI (acute kidney injury) ([place]) 06/06/2020  . Acute CVA (cerebrovascular accident) ([place]) 01/05/2020  . Right knee pain 01/01/2017  . Osteoarthritis of right knee 01/01/2017  . Mental impairment 09/23/2016  . Hypertension 09/23/2016  . Type 2 diabetes mellitus without complication, without long-term current use of insulin ([place]) 09/23/2016  . BPH (benign prostatic hyperplasia) 09/23/2016  . Dementia ([place]) 09/23/2016  . 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 Yes; Fluid consistency: Thin)  AMBULATORY STATUS COMMUNICATION OF NEEDS Skin   Limited Assist Verbally Normal                        Personal Care Assistance Level of Assistance  Bathing,Feeding,Dressing Bathing Assistance: Limited assistance Feeding assistance: Independent Dressing Assistance: Limited assistance     Functional Limitations Info  Sight,Hearing,Speech Sight Info: Adequate Hearing Info: Adequate Speech Info: Adequate    SPECIAL CARE FACTORS FREQUENCY                       Contractures Contractures Info: Not present    Additional Factors Info  Code Status,Allergies,Psychotropic,Insulin Sliding Scale Code Status Info: Full Allergies Info: NKA Psychotropic Info: Elavil (amitriptyline); Desyrel (trazadone) Insulin Sliding Scale Info: See discharge summary       Current Medications (06/23/2020):  This is the current hospital active medication list Current Facility-Administered Medications  Medication Dose Route Frequency Provider Last Rate Last Admin  . acetaminophen (TYLENOL) tablet 650 mg  650 mg Oral Q6H PRN Zierle-Ghosh, Asia B, DO       Or  . acetaminophen (TYLENOL) suppository 650 mg  650 mg Rectal Q6H PRN Zierle-Ghosh, Asia B, DO      . alum & mag hydroxide-simeth (MAALOX/MYLANTA) 200-200-20 MG/5ML suspension 30 mL  30 mL Oral Q6H PRN Zierle-Ghosh, Asia B, DO      . amitriptyline (ELAVIL) tablet 10 mg  10 mg Oral QHS Zierle-Ghosh, Asia B, DO   10 mg at 06/22/20 2236  . atorvastatin (LIPITOR) tablet 40 mg  40 mg Oral Daily Zierle-Ghosh, Asia B, DO  40 mg at 06/22/20 1059  . clopidogrel (PLAVIX) tablet 75 mg  75 mg Oral Daily Zierle-Ghosh, Asia B, DO   75 mg at 06/22/20 1059  . donepezil (ARICEPT) tablet 10 mg  10 mg Oral QHS Zierle-Ghosh, Asia B, DO   10 mg at 06/22/20 2235  . feeding supplement (ENSURE ENLIVE / ENSURE PLUS) liquid 237 mL  237 mL Oral BID BM Zierle-Ghosh, Asia B, DO      . folic acid (FOLVITE) tablet 1 mg  1 mg Oral Daily Shah, Pratik D, DO   1 mg at 06/22/20 1059  . insulin aspart (novoLOG) injection 0-15 Units  0-15 Units Subcutaneous TID WC  Zierle-Ghosh, Asia B, DO   3 Units at 06/22/20 1133  . insulin aspart (novoLOG) injection 0-5 Units  0-5 Units Subcutaneous QHS Zierle-Ghosh, Asia B, DO   2 Units at 06/21/20 2237  . insulin detemir (LEVEMIR) injection 12 Units  12 Units Subcutaneous Daily Heath Lark D, DO   12 Units at 06/22/20 1132  . magnesium sulfate IVPB 2 g 50 mL  2 g Intravenous Once Manuella Ghazi, Pratik D, DO      . memantine [facility]) tablet 10 mg  10 mg Oral BID Zierle-Ghosh, Asia B, DO   10 mg at 06/22/20 2236  . metoprolol tartrate (LOPRESSOR) injection 5 mg  5 mg Intravenous Q6H PRN Manuella Ghazi, Pratik D, DO      . metoprolol tartrate (LOPRESSOR) tablet 25 mg  25 mg Oral BID Manuella Ghazi, Pratik D, DO   25 mg at 06/22/20 2236  . ondansetron (ZOFRAN) tablet 4 mg  4 mg Oral Q6H PRN Zierle-Ghosh, Asia B, DO       Or  . ondansetron (ZOFRAN) injection 4 mg  4 mg Intravenous Q6H PRN Zierle-Ghosh, Asia B, DO      . oxybutynin (DITROPAN-XL) 24 hr tablet 5 mg  5 mg Oral Daily Zierle-Ghosh, Asia B, DO   5 mg at 06/22/20 1059  . pantoprazole (PROTONIX) injection 40 mg  40 mg Intravenous Q12H Aliene Altes S, PA-C   40 mg at 06/22/20 1059  . polyethylene glycol (MIRALAX / GLYCOLAX) packet 17 g  17 g Oral Daily PRN Zierle-Ghosh, Asia B, DO      . polyethylene glycol (MIRALAX / GLYCOLAX) packet 17 g  17 g Oral Daily Aliene Altes S, PA-C   17 g at 06/22/20 1059  . sucralfate (CARAFATE) 1 GM/10ML suspension 1 g  1 g Oral TID WC & HS Daneil Dolin, MD   1 g at 06/22/20 2235  . tamsulosin (FLOMAX) capsule 0.4 mg  0.4 mg Oral QHS Zierle-Ghosh, Asia B, DO   0.4 mg at 06/22/20 2236  . traZODone (DESYREL) tablet 100 mg  100 mg Oral QHS Zierle-Ghosh, Asia B, DO   100 mg at 06/22/20 2236     Discharge Medications: Please see discharge summary for a list of discharge medications.  Relevant Imaging Results:  Relevant Lab Results:   Additional Information Pt SSN: [id number]  [name], LCSW

## 2020-06-24 DIAGNOSIS — R109 Unspecified abdominal pain: Secondary | ICD-10-CM | POA: Diagnosis not present

## 2020-06-24 LAB — CULTURE, BLOOD (ROUTINE X 2)
Culture: NO GROWTH
Culture: NO GROWTH
Special Requests: ADEQUATE
Special Requests: ADEQUATE

## 2020-06-24 LAB — GLUCOSE, CAPILLARY
Glucose-Capillary: 76 mg/dL (ref 70–99)
Glucose-Capillary: 120 mg/dL — ABNORMAL HIGH (ref 70–99)
Glucose-Capillary: 136 mg/dL — ABNORMAL HIGH (ref 70–99)
Glucose-Capillary: 134 mg/dL — ABNORMAL HIGH (ref 70–99)
Glucose-Capillary: 99 mg/dL (ref 70–99)

## 2020-06-24 LAB — BASIC METABOLIC PANEL
Anion gap: 6 (ref 5–15)
BUN: 13 mg/dL (ref 8–23)
CO2: 31 mmol/L (ref 22–32)
Calcium: 10.1 mg/dL (ref 8.9–10.3)
Chloride: 93 mmol/L — ABNORMAL LOW (ref 98–111)
Creatinine, Ser: 1.05 mg/dL (ref 0.61–1.24)
GFR, Estimated: >60 mL/min (ref >60)
Glucose, Bld: 82 mg/dL (ref 70–99)
Potassium: 3.4 mmol/L — ABNORMAL LOW (ref 3.5–5.1)
Sodium: 130 mmol/L — ABNORMAL LOW (ref 135–145)

## 2020-06-24 LAB — MAGNESIUM: Magnesium: 1.1 mg/dL — ABNORMAL LOW (ref 1.7–2.4)

## 2020-06-24 LAB — OCCULT BLOOD X 1 CARD TO LAB, STOOL: Fecal Occult Bld: POSITIVE — AB

## 2020-06-24 MED ORDER — POTASSIUM CHLORIDE CRYS ER 20 MEQ PO TBCR
40.0000 meq | EXTENDED_RELEASE_TABLET | Freq: Once | ORAL | Status: AC
  Administered 2020-06-24: 40 meq via ORAL
  Filled 2020-06-24: qty 2

## 2020-06-24 MED ORDER — MAGNESIUM SULFATE 4 GM/100ML IV SOLN
4.0000 g | Freq: Once | INTRAVENOUS | Status: AC
  Administered 2020-06-24: 4 g via INTRAVENOUS
  Filled 2020-06-24: qty 100

## 2020-06-24 MED ORDER — MAGNESIUM OXIDE 400 (241.3 MG) MG PO TABS
800.0000 mg | ORAL_TABLET | Freq: Two times a day (BID) | ORAL | Status: DC
  Administered 2020-06-24 – 2020-06-26 (×5): 800 mg via ORAL
  Filled 2020-06-24 (×5): qty 2

## 2020-06-24 NOTE — Progress Notes (Signed)
Patient seen and evaluated this morning with no acute overnight events noted.  He denies any obvious complaints or concerns.  Please refer to discharge summary dictated 06/23/2020 for full details.  He remains in the hospital due to significant weakness which was assessed by PT with recommendations for SNF/rehab at discharge.  This was discussed with his legal guardian on 1/15 who agrees with the plan.  Currently awaiting facility placement.  He continues to have persistent hypomagnesemia and some hypokalemia today.  This appears to be attributed to his poor absorption due to upper GI tract pathology as well as poorly controlled type 2 diabetes.  Continue electrolyte supplementation today and monitor repeat labs.  Total care time: 20 minutes.

## 2020-06-24 NOTE — TOC Progression Note (Signed)
Transition of Care [facility]) - Progression Note    Patient Details  Name: Daniel [name] MRN: [id number] Date of Birth: 1951-04-11  [facility]) CM/SW Contact  [name], LCSW Phone Number: 06/24/2020, 8:40 AM  Clinical Narrative:     To Whom it may Concern,  Please be advised that the above patient will require a short term nursing home stay anticipated 30 days or less rehabilitation and strengthening. The plan is for return home.   Expected Discharge Plan: Group Home Barriers to Discharge: Barriers Resolved  Expected Discharge Plan and Services Expected Discharge Plan: Group Home In-house Referral: Clinical Social Work Discharge Planning Services: NA Post Acute Care Choice: NA Living arrangements for the past 2 months: Group Home Expected Discharge Date: 06/23/20               DME Arranged: N/A DME Agency: NA       HH Arranged: NA [place] Agency: NA         Social Determinants of Health (SDOH) Interventions    Readmission Risk Interventions Readmission Risk Prevention Plan 06/21/2020 06/08/2020  Transportation Screening Complete Complete  PCP or Specialist Appt within 5-7 Days - Complete  Home Care Screening - Complete  Medication Review (RN CM) - Complete  HRI or Home Care Consult Complete -  Social Work Consult for [place] Planning/Counseling Complete -  Palliative Care Screening Not Applicable -  Medication Review Press photographer) Complete -  Some recent data might be hidden

## 2020-06-24 NOTE — TOC Progression Note (Signed)
Transition of Care [facility]) - Progression Note    Patient Details  Name: Daniel [name] MRN: [id number] Date of Birth: [birth year]  Transition of Care [facility]) CM/SW Contact  [name], LCSW Phone Number: 06/24/2020, 8:44 AM  Clinical Narrative:     To Whom it may concern,   Please be advised that the above named patient has a primary diagnosis of dementia which supercedes any psychiatric diagnosis.   Expected Discharge Plan: Group Home Barriers to Discharge: Barriers Resolved  Expected Discharge Plan and Services Expected Discharge Plan: Group Home In-house Referral: Clinical Social Work Discharge Planning Services: NA Post Acute Care Choice: NA Living arrangements for the past 2 months: Group Home Expected Discharge Date: 06/23/20               DME Arranged: N/A DME Agency: NA       HH Arranged: NA [place] Agency: NA         Social Determinants of Health (SDOH) Interventions    Readmission Risk Interventions Readmission Risk Prevention Plan 06/21/2020 06/08/2020  Transportation Screening Complete Complete  PCP or Specialist Appt within 5-7 Days - Complete  Home Care Screening - Complete  Medication Review (RN CM) - Complete  HRI or Home Care Consult Complete -  Social Work Consult for [place] Planning/Counseling Complete -  Palliative Care Screening Not Applicable -  Medication Review Press photographer) Complete -  Some recent data might be hidden

## 2020-06-25 ENCOUNTER — Inpatient Hospital Stay (HOSPITAL_COMMUNITY): Payer: Medicare Other

## 2020-06-25 DIAGNOSIS — R109 Unspecified abdominal pain: Secondary | ICD-10-CM | POA: Diagnosis not present

## 2020-06-25 LAB — CBC
HCT: 27.3 % — ABNORMAL LOW (ref 39.0–52.0)
Hemoglobin: 8.9 g/dL — ABNORMAL LOW (ref 13.0–17.0)
MCH: 28.9 pg (ref 26.0–34.0)
MCHC: 32.6 g/dL (ref 30.0–36.0)
MCV: 88.6 fL (ref 80.0–100.0)
Platelets: 268 10*3/uL (ref 150–400)
RBC: 3.08 MIL/uL — ABNORMAL LOW (ref 4.22–5.81)
RDW: 14.8 % (ref 11.5–15.5)
WBC: 4.8 10*3/uL (ref 4.0–10.5)
nRBC: 0 % (ref 0.0–0.2)

## 2020-06-25 LAB — BASIC METABOLIC PANEL
Anion gap: 6 (ref 5–15)
BUN: 17 mg/dL (ref 8–23)
CO2: 29 mmol/L (ref 22–32)
Calcium: 9.3 mg/dL (ref 8.9–10.3)
Chloride: 96 mmol/L — ABNORMAL LOW (ref 98–111)
Creatinine, Ser: 1.03 mg/dL (ref 0.61–1.24)
GFR, Estimated: >60 mL/min (ref >60)
Glucose, Bld: 94 mg/dL (ref 70–99)
Potassium: 3.6 mmol/L (ref 3.5–5.1)
Sodium: 131 mmol/L — ABNORMAL LOW (ref 135–145)

## 2020-06-25 LAB — GLUCOSE, CAPILLARY
Glucose-Capillary: 80 mg/dL (ref 70–99)
Glucose-Capillary: 136 mg/dL — ABNORMAL HIGH (ref 70–99)

## 2020-06-25 LAB — MAGNESIUM: Magnesium: 1.4 mg/dL — ABNORMAL LOW (ref 1.7–2.4)

## 2020-06-25 LAB — SARS CORONAVIRUS 2 (TAT 6-24 HRS): SARS Coronavirus 2: NEGATIVE

## 2020-06-25 IMAGING — MR MR MRCP
11 of 15 series · 34 of 48 positions shown · non-contrast
Comparison: [DATE] CT

CLINICAL DATA: upper abdominal pain. Abnormal CT with prominent
pancreatic duct.

EXAM:
MRI ABDOMEN WITHOUT CONTRAST  (INCLUDING MRCP)
TECHNIQUE: Multiplanar multisequence MR imaging of the abdomen was performed.
Heavily T2-weighted images of the biliary and pancreatic ducts were
obtained, and three-dimensional MRCP images were rendered by post
processing.

[Series 4: ax haste · axial · 6.0mm · 1.19mm/px · z∈[-89,+120]mm · 3 of 30 slices shown]
[im 1/30]
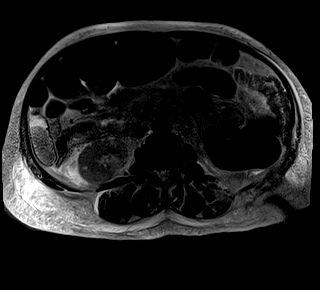
[im 15/30]
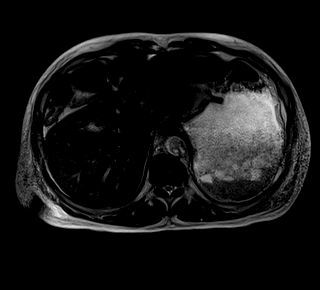
[im 30/30]
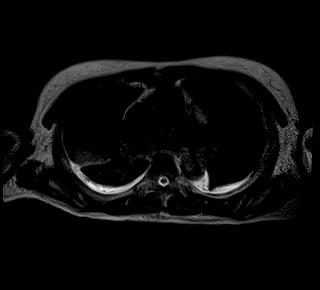

[Series 5: cor haste · coronal · 6.0mm · 1.25mm/px · 3 of 34 slices shown]
[im 1/34]
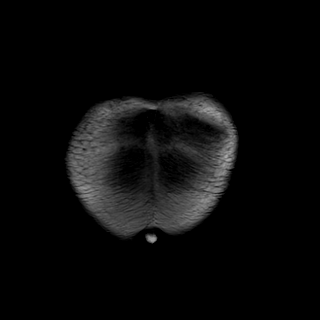
[im 17/34]
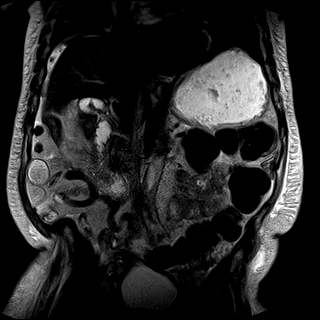
[im 34/34]
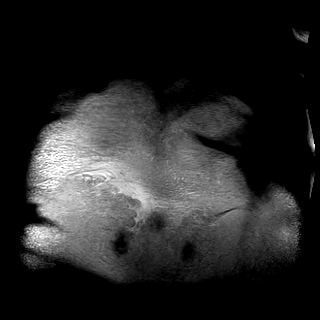

[Series 7: T2 fat-sat · axial · 6.0mm · 1.19mm/px · 1 of 12 slices shown (1 of 2)]
[im 1/12]
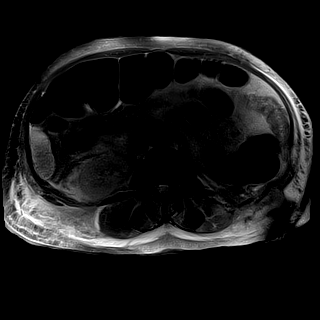

[Series 8: T2 fat-sat · axial · 6.0mm · 1.19mm/px · z∈[-89,+120]mm · 2 of 30 slices shown (2 of 2)]
[im 1/30]
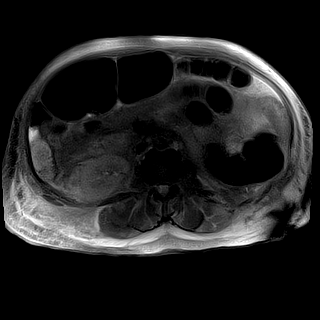
[im 30/30]
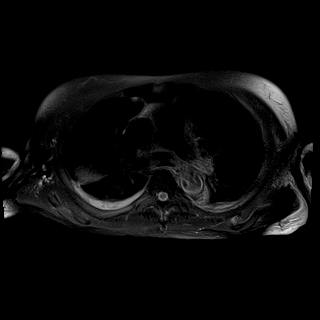

[Series 9: DWI · axial · 6.0mm · 1.42mm/px · z∈[-89,+120]mm · 2 of 30 slices shown (1 of 4)]
[im 1/30]
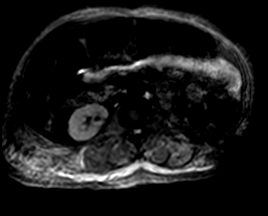
[im 30/30]
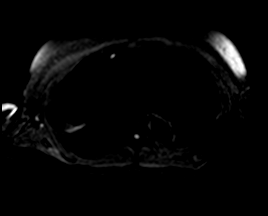

[Series 9: DWI · axial · 6.0mm · 1.42mm/px · z∈[-89,+120]mm · 2 of 30 slices shown (2 of 4)]
[im 1/30]
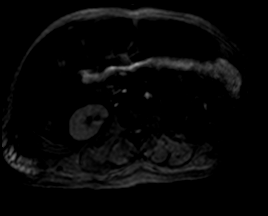
[im 30/30]
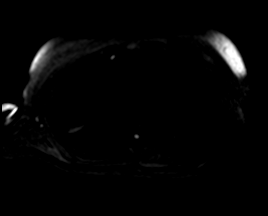

[Series 9: DWI · axial · 6.0mm · 1.42mm/px · z∈[-89,+120]mm · 2 of 30 slices shown (3 of 4)]
[im 1/30]
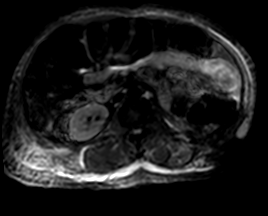
[im 30/30]
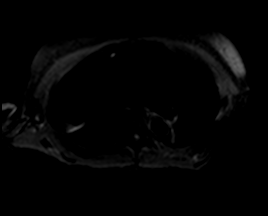

[Series 10: DWI · axial · 6.0mm · 1.42mm/px · z∈[-89,+120]mm · 2 of 30 slices shown (4 of 4)]
[im 1/30]
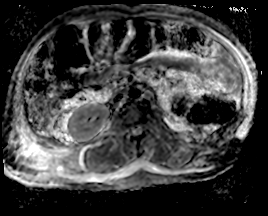
[im 30/30]
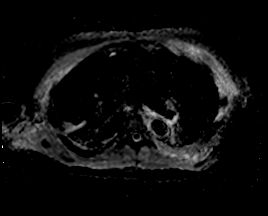

[Series 11: ax in and · axial · 3.0mm · 1.25mm/px · z∈[-94,+119]mm · 6 of 72 slices shown (1 of 2)]
[im 1/72]
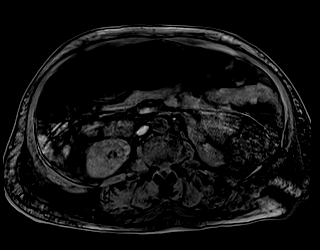
[im 15/72]
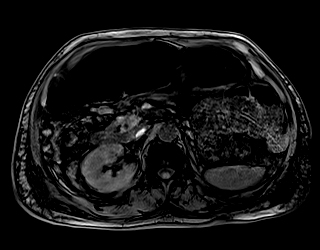
[im 29/72]
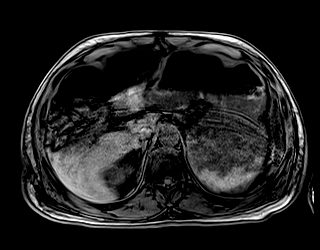
[im 43/72]
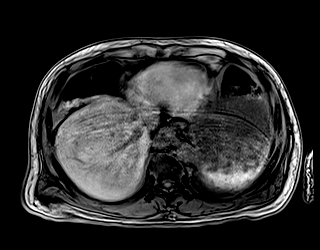
[im 57/72]
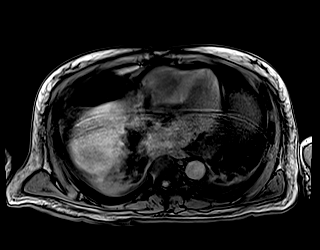
[im 72/72]
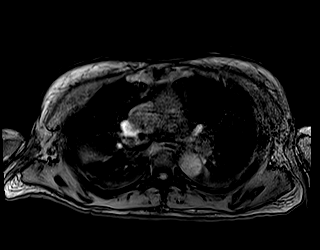

[Series 12: ax in and · axial · 3.0mm · 1.25mm/px · z∈[-94,+119]mm · 6 of 72 slices shown (2 of 2)]
[im 1/72]
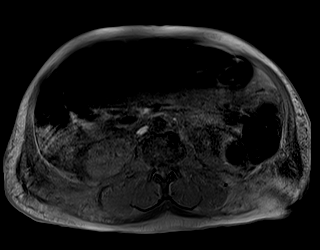
[im 15/72]
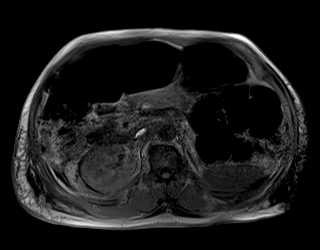
[im 29/72]
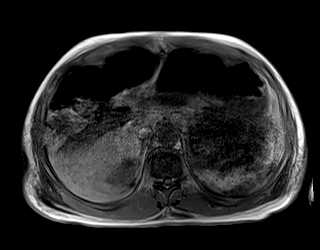
[im 43/72]
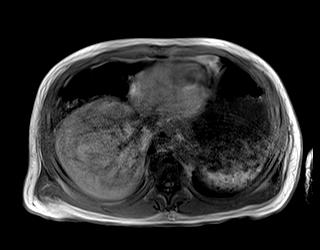
[im 57/72]
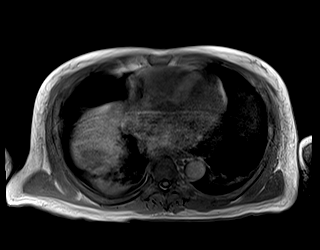
[im 72/72]
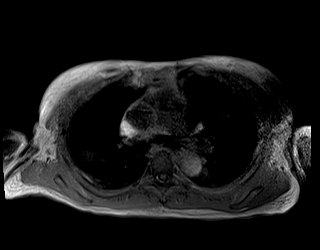

[Series 15: [id number] · coronal · 1.2mm · 0.49mm/px · 5 of 64 slices shown]
[im 1/64]
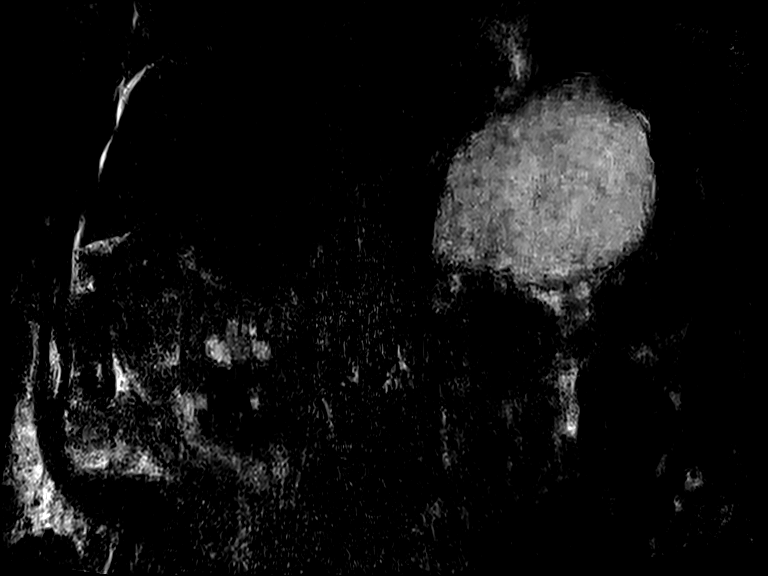
[im 16/64]
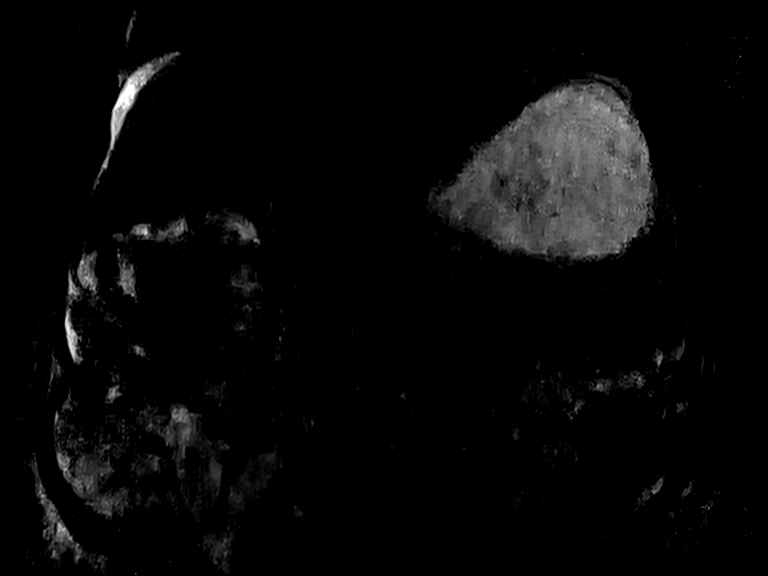
[im 32/64]
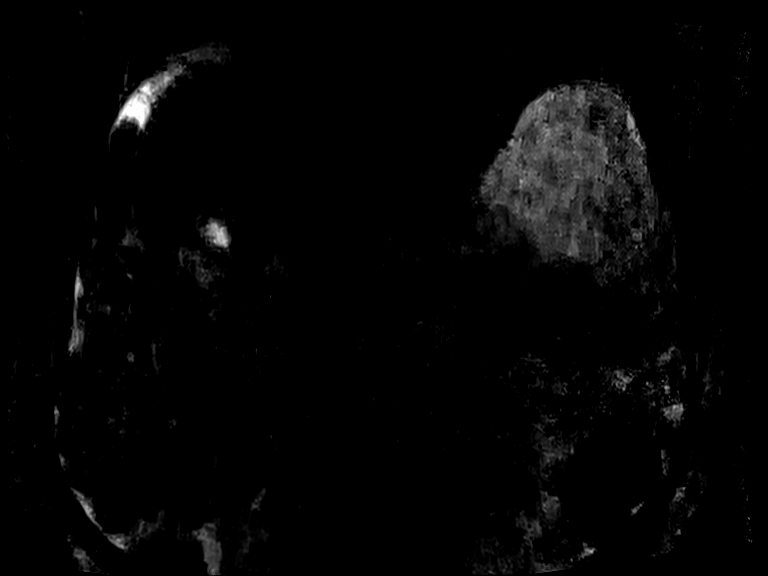
[im 48/64]
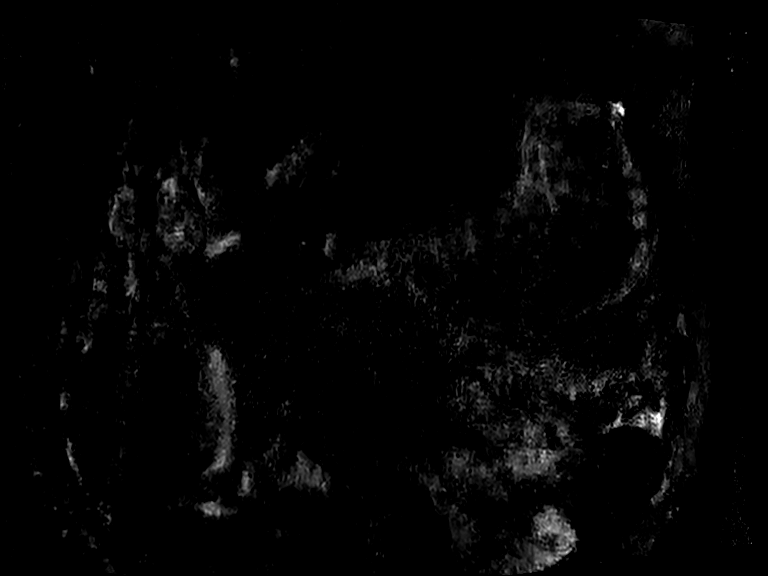
[im 64/64]
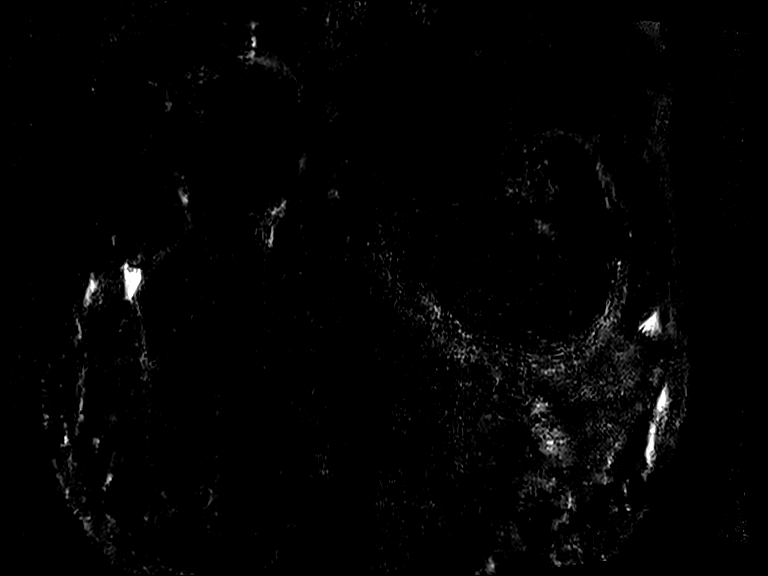

[34 of 48 positions shown; findings below may reference images not displayed]

FINDINGS: Exam is moderately degraded. Postcontrast imaging and repeat series
could not be performed. Per technologist comments, the patient
cannot communicate. Exam is nearly nondiagnostic.

Lower chest: Small bilateral pleural effusions. Mild cardiomegaly.
Bilateral gynecomastia

Hepatobiliary: Normal noncontrast appearance of the liver. Grossly
normal gallbladder. No biliary duct dilatation or
choledocholithiasis.

Pancreas: Suboptimally evaluated. Developmental absence or atrophy
of the pancreatic tail is more apparent on the recent CT. No
pancreatic duct dilatation. No gross peripancreatic edema.

Spleen:  Grossly normal spleen.

Adrenals/Urinary Tract: Left kidney not identified, developmentally
or surgically absent. Multiple T2 hyperintense right renal lesions
are likely cysts. No hydronephrosis.

Stomach/Bowel: stomach not well evaluated. No gross residual wall
thickening within the distal gastric wall thickening. Normal caliber
of abdominal bowel loops.

Vascular/Lymphatic: Normal aortic caliber. No abdominal adenopathy.

Other:  Trace abdominal ascites.

Musculoskeletal: No acute osseous abnormality.
IMPRESSION: 1. Moderate to severely degraded, nearly nondiagnostic exam
secondary to multiple factors as detailed above.
2. Pancreas not well evaluated. Likely developmental absence or
atrophy of the pancreatic tail.
3. Grossly normal gallbladder and biliary tract.
4. Small bilateral pleural effusions and trace abdominal ascites.
5. Left kidney not identified.
6. Bilateral gynecomastia.

## 2020-06-25 MED ORDER — MAGNESIUM SULFATE 4 GM/100ML IV SOLN
4.0000 g | Freq: Once | INTRAVENOUS | Status: AC
  Administered 2020-06-25: 4 g via INTRAVENOUS
  Filled 2020-06-25: qty 100

## 2020-06-25 NOTE — Progress Notes (Addendum)
Patient seen and evaluated this morning.  He was noted to have some scant fresh blood in his stool yesterday and is noted to have a slight downward trend in his hemoglobin levels.  Stool occult is positive.  Discussed case with Dr. Jenetta Downer with GI.  Patient appears to have been on Plavix since his endoscopy and this has been discontinued.  Likely, this should stabilize on its own and we will follow repeat CBC in a.m.  Continue magnesium supplementation as ordered.  Anticipate discharge in a.m. if hemoglobin trend remained stable without any further overt bleeding, otherwise may need to consider colonoscopy.  Please refer to discharge summary dictated 06/23/2020 for full details.  Spoke with guardian Stephannie Peters on 1/17 at [phone].  Total care time: 25 minutes.

## 2020-06-25 NOTE — TOC Progression Note (Addendum)
Transition of Care [facility]) - Progression Note    Patient Details  Name: Daniel [name] MRN: [id number] Date of Birth: Jun 26, 1950  Transition [facility] Care [facility]) CM/SW Contact  [name], LCSW Phone Number: 06/25/2020, 10:03 AM  Clinical Narrative:    CSW notified MD of needed Covid test if discharging to SNF on 01/18. CSW LVM with  [name] to inquire about patient's Covid 19 vaccination status. TOC to follow.  Addendum [name] Price's supervisor contacted CSW. Supervisor agreeable to [facility] and reported that [facility] possessed the copy of the vaccination card. CSW was able to obtain copy of vaccination card from [place] home and provided it to [facility]. Bryson Ha with [facility] agreeable to take patient on 06/26/2020   Expected Discharge Plan: Group Home Barriers to Discharge: Barriers Resolved  Expected Discharge Plan and Services Expected Discharge Plan: Group Home In-house Referral: Clinical Social Work Discharge Planning Services: NA Post Acute Care Choice: NA Living arrangements for the past 2 months: Group Home Expected Discharge Date: 06/23/20               DME Arranged: N/A DME Agency: NA       HH Arranged: NA [place] Agency: NA         Social Determinants of Health (SDOH) Interventions    Readmission Risk Interventions Readmission Risk Prevention Plan 06/21/2020 06/08/2020  Transportation Screening Complete Complete  PCP or Specialist Appt within 5-7 Days - Complete  Home Care Screening - Complete  Medication Review (RN CM) - Complete  HRI or Home Care Consult Complete -  Social Work Consult for [place] Planning/Counseling Complete -  Palliative Care Screening Not Applicable -  Medication Review Press photographer) Complete -  Some recent data might be hidden

## 2020-06-25 NOTE — Progress Notes (Signed)
Physical Therapy Treatment Patient Details Name: Daniel [name] MRN: [id number] DOB: Dec 28, 1950 Today's Date: 06/25/2020    History of Present Illness Daniel [name]  is a 70 y.o. male, with history of hypertension, hyperlipidemia, diabetes mellitus presents to the ED with a chief complaint of epigastric pain.  Patient is able to point on his abdomen to tell me where the pain is.  He is not sure how to characterize it but describes it as a hurt.  He is not sure how long its been there.  He does note that is completely better now.  He does not know what made it better.  Patient does have a history of cognitive impairment.  Patient has denied any vomiting or diarrhea.  He denies fever and dysuria as well.  Patient denies chest pain at this time.  Unfortunately patient is not able to provide more history than this due to cognitive impairment.    PT Comments    Patient requires mod assist for bed mobility requiring assist for LE movement and pull to sitting to upright trunk. Patient showing fair/poor sitting balance initially requiring cueing for UE support and assist. Patient able to maintain slouched sitting posture without physical assist after several minutes. Patient with c/o dizziness upon transitioning to seated which does not subside with time. Patient completes sitting exercises with cueing and demonstration for exercise. Patient requests to return to bed as he is limited with fatigue and not feeling well with sitting today. Patient will benefit from continued physical therapy in hospital and recommended venue below to increase strength, balance, endurance for safe ADLs and gait.    Follow Up Recommendations  SNF     Equipment Recommendations       Recommendations for Other Services       Precautions / Restrictions Precautions Precautions: Fall Restrictions Weight Bearing Restrictions: No    Mobility  Bed Mobility Overal bed mobility: Needs Assistance Bed Mobility: Supine to Sit;Sit  to Supine     Supine to sit: Mod assist Sit to supine: Min assist   General bed mobility comments: increased time, labored movement  Transfers                    Ambulation/Gait                 Stairs             Wheelchair Mobility    Modified Rankin (Stroke Patients Only)       Balance Overall balance assessment: Needs assistance Sitting-balance support: Feet supported;No upper extremity supported Sitting balance-Leahy Scale: Fair Sitting balance - Comments: seated at EOB                                    Cognition Arousal/Alertness: Awake/alert Behavior During Therapy: WFL for tasks assessed/performed Overall Cognitive Status: History of cognitive impairments - at baseline                                        Exercises General Exercises - Lower Extremity Short Arc Quad: AROM;Both;10 reps;Seated Toe Raises: AROM;10 reps;Seated;Both Heel Raises: AROM;Both;10 reps;Seated    General Comments        Pertinent Vitals/Pain Pain Assessment: No/denies pain    Home Living  Prior Function            PT Goals (current goals can now be found in the care plan section) Acute Rehab PT Goals Patient Stated Goal: return home PT Goal Formulation: With patient Time For Goal Achievement: 07/07/20 Potential to Achieve Goals: Good Progress towards PT goals: Progressing toward goals    Frequency    Min 3X/week      PT Plan Current plan remains appropriate    Co-evaluation              AM-PAC PT "6 Clicks" Mobility   Outcome Measure  Help needed turning from your back to your side while in a flat bed without using bedrails?: A Little Help needed moving from lying on your back to sitting on the side of a flat bed without using bedrails?: A Lot Help needed moving to and from a bed to a chair (including a wheelchair)?: A Lot Help needed standing up from a chair using your  arms (e.g., wheelchair or bedside chair)?: A Lot Help needed to walk in hospital room?: A Lot Help needed climbing 3-5 steps with a railing? : Total 6 Click Score: 12    End of Session   Activity Tolerance: Patient tolerated treatment well;Patient limited by fatigue Patient left: in bed;with call bell/phone within reach;with bed alarm set Nurse Communication: Mobility status PT Visit Diagnosis: Unsteadiness on feet (R26.81);Other abnormalities of gait and mobility (R26.89);Muscle weakness (generalized) (M62.81)     Time: 9629-5284 PT Time Calculation (min) (ACUTE ONLY): 12 min  Charges:  $Therapeutic Exercise: 8-22 mins                     8:41 AM, 06/25/20 Mearl Latin PT, DPT Physical Therapist at [facility]

## 2020-06-25 NOTE — Plan of Care (Signed)
Cardiac monitoring discontinued. Patient awaiting SNF placement. No questions overnight.

## 2020-06-26 DIAGNOSIS — R109 Unspecified abdominal pain: Secondary | ICD-10-CM | POA: Diagnosis not present

## 2020-06-26 LAB — GLUCOSE, CAPILLARY
Glucose-Capillary: 144 mg/dL — ABNORMAL HIGH (ref 70–99)
Glucose-Capillary: 110 mg/dL — ABNORMAL HIGH (ref 70–99)
Glucose-Capillary: 84 mg/dL (ref 70–99)
Glucose-Capillary: 253 mg/dL — ABNORMAL HIGH (ref 70–99)
Glucose-Capillary: 133 mg/dL — ABNORMAL HIGH (ref 70–99)
Glucose-Capillary: 93 mg/dL (ref 70–99)
Glucose-Capillary: 160 mg/dL — ABNORMAL HIGH (ref 70–99)

## 2020-06-26 LAB — CBC
HCT: 28.7 % — ABNORMAL LOW (ref 39.0–52.0)
Hemoglobin: 9.4 g/dL — ABNORMAL LOW (ref 13.0–17.0)
MCH: 29 pg (ref 26.0–34.0)
MCHC: 32.8 g/dL (ref 30.0–36.0)
MCV: 88.6 fL (ref 80.0–100.0)
Platelets: 279 10*3/uL (ref 150–400)
RBC: 3.24 MIL/uL — ABNORMAL LOW (ref 4.22–5.81)
RDW: 15 % (ref 11.5–15.5)
WBC: 5.2 10*3/uL (ref 4.0–10.5)
nRBC: 0 % (ref 0.0–0.2)

## 2020-06-26 LAB — BASIC METABOLIC PANEL
Anion gap: 6 (ref 5–15)
BUN: 14 mg/dL (ref 8–23)
CO2: 30 mmol/L (ref 22–32)
Calcium: 9.9 mg/dL (ref 8.9–10.3)
Chloride: 94 mmol/L — ABNORMAL LOW (ref 98–111)
Creatinine, Ser: 1.03 mg/dL (ref 0.61–1.24)
GFR, Estimated: >60 mL/min (ref >60)
Glucose, Bld: 98 mg/dL (ref 70–99)
Potassium: 3.7 mmol/L (ref 3.5–5.1)
Sodium: 130 mmol/L — ABNORMAL LOW (ref 135–145)

## 2020-06-26 LAB — MAGNESIUM: Magnesium: 1.4 mg/dL — ABNORMAL LOW (ref 1.7–2.4)

## 2020-06-26 MED ORDER — MAGNESIUM SULFATE 4 GM/100ML IV SOLN
4.0000 g | Freq: Once | INTRAVENOUS | Status: AC
  Administered 2020-06-26: 4 g via INTRAVENOUS
  Filled 2020-06-26: qty 100

## 2020-06-26 MED ORDER — MAGNESIUM OXIDE 400 MG PO TABS: 800.0000 mg | ORAL_TABLET | Freq: Two times a day (BID) | ORAL | 2 refills | Status: DC

## 2020-06-26 NOTE — Progress Notes (Signed)
Patient seen and evaluated this morning. No further bowel movements or overt bleeding noted. Plan to keep off all antiplatelet agents on discharge. His hemoglobin levels have remained stable and he may discharge to SNF. Please refer to discharge summary 06/23/2020 for full details. Discussed with guardian.  Total care time: 20 minutes.

## 2020-06-26 NOTE — TOC Transition Note (Signed)
Transition of Care [facility]) - CM/SW Discharge Note   Patient Details  Name: Daniel [name] MRN: [id number] Date of Birth: [birth year]  Transition of [facility]) CM/SW Contact:  Boneta Lucks, RN Phone Number: 06/26/2020, 3:00 PM   Clinical Narrative:   Patient discharging to [facility]. Clinical sent in the hub. EMS called, RN called report.     Final next level of care: Skilled Nursing Facility Barriers to Discharge: Barriers Resolved   Patient Goals and CMS Choice Patient states their goals for this hospitalization and ongoing recovery are:: to go to SNF. CMS Medicare.gov Compare Post Acute Care list provided to:: Patient Choice offered to / list presented to : Patient  Discharge Placement              Patient chooses bed at:  [facility]) Patient to be transferred to facility by: EMS Name of family member notified: [name] Patient and family notified of of transfer: 06/26/20  Discharge Plan and Services In-house Referral: Clinical Social Work Discharge Planning Services: NA Post Acute Care Choice: NA          DME Arranged: N/A DME Agency: NA       HH Arranged: NA [place] Agency: NA      Readmission Risk Interventions Readmission Risk Prevention Plan 06/21/2020 06/08/2020  Transportation Screening Complete Complete  PCP or Specialist Appt within 5-7 Days - Complete  Home Care Screening - Complete  Medication Review (RN CM) - Complete  HRI or Home Care Consult Complete -  Social Work Consult for [place] Planning/Counseling Complete -  Palliative Care Screening Not Applicable -  Medication Review Press photographer) Complete -  Some recent data might be hidden

## 2020-06-27 NOTE — Telephone Encounter (Signed)
Forwarded to Mitzie and Leigh Kyron Schlitt. 

## 2020-06-28 ENCOUNTER — Encounter (HOSPITAL_COMMUNITY): Payer: Self-pay | Admitting: Internal Medicine

## 2020-07-06 NOTE — Telephone Encounter (Signed)
I will schedule his 12 wk f/u EGD at his 4 wk office visit

## 2020-07-18 ENCOUNTER — Ambulatory Visit (INDEPENDENT_AMBULATORY_CARE_PROVIDER_SITE_OTHER): Payer: Medicare Other | Admitting: Internal Medicine

## 2020-07-19 ENCOUNTER — Ambulatory Visit (INDEPENDENT_AMBULATORY_CARE_PROVIDER_SITE_OTHER): Payer: Medicare Other | Admitting: Internal Medicine

## 2020-07-24 ENCOUNTER — Other Ambulatory Visit: Payer: Self-pay

## 2020-07-24 ENCOUNTER — Ambulatory Visit (INDEPENDENT_AMBULATORY_CARE_PROVIDER_SITE_OTHER): Payer: Medicare Other | Admitting: Gastroenterology

## 2020-07-24 ENCOUNTER — Encounter (INDEPENDENT_AMBULATORY_CARE_PROVIDER_SITE_OTHER): Payer: Self-pay | Admitting: Gastroenterology

## 2020-07-24 VITALS — BP 129/86 | HR 106 | Temp 98.2°F | Ht 71.0 in | Wt 150.0 lb

## 2020-07-24 DIAGNOSIS — Q453 Other congenital malformations of pancreas and pancreatic duct: Secondary | ICD-10-CM | POA: Diagnosis not present

## 2020-07-24 DIAGNOSIS — K209 Esophagitis, unspecified without bleeding: Secondary | ICD-10-CM | POA: Diagnosis not present

## 2020-07-24 DIAGNOSIS — K259 Gastric ulcer, unspecified as acute or chronic, without hemorrhage or perforation: Secondary | ICD-10-CM | POA: Insufficient documentation

## 2020-07-24 DIAGNOSIS — K3189 Other diseases of stomach and duodenum: Secondary | ICD-10-CM | POA: Diagnosis not present

## 2020-07-24 MED ORDER — PANTOPRAZOLE SODIUM 40 MG PO TBEC
40.0000 mg | DELAYED_RELEASE_TABLET | Freq: Two times a day (BID) | ORAL | 0 refills | Status: DC
Start: 2020-07-24 — End: 2020-08-30

## 2020-07-24 NOTE — Progress Notes (Signed)
Daniel [name], M.D. Gastroenterology & Hepatology [facility] [address] [place], [place] [address]  Primary Care Physician: Leonie Douglas, MD [address] [place] [address]  I will communicate my assessment and recommendations to the referring MD via EMR.  Problems: 1. Gastric ulcers with upper gastrointestinal bleeding 2. Severe esophagitis 3. Duodenal mass  History of Present Illness: Daniel [name] is a 70 y.o. male with past medical history of diabetes, hyperlipidemia, hypertension, cognitive impairment and CVA, who presents for follow up after hospitalization for gastrointestinal bleeding.  The patient was hospitalized at [facility] on 06/20/2020 after he had episodes of substernal chest pain.  Upon admission, the patient was found to have an increased lactic acid of 2.5 with a hemoglobin of 12.2 but subsequently decreased to 10.3.  Due to severity of the pain and drop in his hemoglobin gastroenterology was consulted.  EGD was performed which showed presence of a tubular esophagus with presence of severe esophagitis.  There was also presence of 4 gastric ulcers in the stomach with largest 2.5 cm.  These ulcers were biopsied and were negative for H. pylori or dysplasia.  Also was found to have gastric erosions.  Notably, there was presence of 2.5x3.5 polypoid mass in the second portion of the duodenum opposite the ampulla but given the fact the patient was taking Plavix, no further biopsies were performed.  Notably, the patient underwent a CT of the abdomen and pelvis with IV contrast that showed slight prominence of the proximal pancreatic duct up to 3 mm, there was also presence of colonic tortuosity and presence of stool, there was also presence of esophageal thickening.  Patient underwent MRCP that did not show any gross masses but the quality of the study was limited due to motion.  The patient was discharged from  the hospital on oral iron supplementation and pantoprazole 40 mg twice a day, his Plavix was held until further recommendations were performed.  The patient states feeling well and denies have any complaint at the moment.  He comes with a health aide who provides part of the information.  I called his legal guardian Mrs. [name] Price who also provided some of the information.  The patient denies having any nausea, vomiting, fever, chills, hematochezia, melena, hematemesis, abdominal distention, abdominal pain, diarrhea, jaundice, pruritus or weight loss.  He has been eating more for the last week.  Most recent available labs were performed at his rehab facility, with most recent labs on 07/06/2020 showing a hemoglobin of 9.0, MCV 87, although cell count 4.2, platelets 233 BMP with normal creatinine 1.01 and BUN 11 rest within normal limits.  Past Medical History: Past Medical History:  Diagnosis Date  . Diabetes mellitus without complication ([place])   . High cholesterol   . Hypertension     Past Surgical History: Past Surgical History:  Procedure Laterality Date  . BIOPSY  06/21/2020   Procedure: BIOPSY;  Surgeon: Daneil Dolin, MD;  Location[facility];  Service: Endoscopy;;  gastric  . ESOPHAGOGASTRODUODENOSCOPY (EGD) WITH PROPOFOL N/A 06/21/2020   Procedure: ESOPHAGOGASTRODUODENOSCOPY (EGD) WITH PROPOFOL;  Surgeon: Daneil Dolin, MD;  Location: [facility];  Service: Endoscopy;  Laterality: N/A;  . HIP SURGERY     left  . KIDNEY SURGERY      Family History: Family History  Family history unknown: Yes    Social History: Social History   Tobacco Use  Smoking Status Never Smoker  Smokeless Tobacco Never Used   Social History   Substance and Sexual Activity  Alcohol Use No   Social History   Substance and Sexual Activity  Drug Use No    Allergies: No Known Allergies  Medications: Current Outpatient Medications  Medication Sig Dispense Refill  .  acetaminophen (TYLENOL) 650 MG CR tablet Take 650 mg by mouth 2 (two) times daily.    Marland Kitchen amitriptyline (ELAVIL) 10 MG tablet Take 10 mg by mouth at bedtime.    Marland Kitchen atorvastatin (LIPITOR) 40 MG tablet Take 1 tablet (40 mg total) by mouth daily. 30 tablet 0  . Cholecalciferol (VITAMIN D3) 2000 units capsule TAKE 1 CAPSULE BY MOUTH ONCE DAILY. **DO NOT CRUSH** (Patient taking differently: Take 2,000 Units by mouth daily.) 30 capsule 5  . donepezil (ARICEPT) 10 MG tablet TAKE (1) TABLET BY MOUTH ONCE DAILY. (Patient taking differently: Take 10 mg by mouth daily.) 30 tablet PRN  . feeding supplement (ENSURE ENLIVE / ENSURE PLUS) LIQD Take 237 mLs by mouth 2 (two) times daily between meals. 237 mL 12  . FEROSUL 325 (65 Fe) MG tablet TAKE 1 TABLET BY MOUTH TWICE DAILY. (Patient taking differently: Take 325 mg by mouth in the morning and at bedtime.) 180 tablet 3  . gentian violet 1 % topical solution Apply 1 mL topically See admin instructions. Applied between toes daily    . magnesium oxide (MAG-OX) 400 MG tablet Take 2 tablets (800 mg total) by mouth 2 (two) times daily. 120 tablet 2  . memantine (NAMENDA) 10 MG tablet Take 10 mg by mouth 2 (two) times daily.    . metFORMIN (GLUCOPHAGE) 1000 MG tablet Take 1 tablet (1,000 mg total) by mouth 2 (two) times daily with a meal. 60 tablet 3  . metoprolol tartrate (LOPRESSOR) 25 MG tablet Take 1 tablet (25 mg total) by mouth 2 (two) times daily. 60 tablet 3  . ondansetron (ZOFRAN) 4 MG tablet Take 1 tablet (4 mg total) by mouth every 6 (six) hours as needed for nausea. 20 tablet 0  . oxybutynin (DITROPAN-XL) 5 MG 24 hr tablet Take 5 mg by mouth daily.    . pantoprazole (PROTONIX) 40 MG tablet Take 1 tablet (40 mg total) by mouth 2 (two) times daily. 180 tablet 0  . potassium chloride (KLOR-CON) 10 MEQ tablet Take 2 tablets (20 mEq total) by mouth daily. (Patient taking differently: Take 20 mEq by mouth daily. For 15 days Course started on 1.1.21) 30 tablet 0  .  Semaglutide,0.25 or 0.5MG /DOS, (OZEMPIC, 0.25 OR 0.5 MG/DOSE,) 2 MG/1.5ML SOPN Inject 0.5 mg into the skin once a week. 1.5 mL 2  . sucralfate (CARAFATE) 1 g tablet Take 1 tablet (1 g total) by mouth 4 (four) times daily. 120 tablet 1  . tamsulosin (FLOMAX) 0.4 MG CAPS capsule TAKE 1 CAPSULE BY MOUTH AT BEDTIME. (Patient taking differently: Take 0.4 mg by mouth at bedtime.) 90 capsule 1  . traZODone (DESYREL) 100 MG tablet Take 100 mg by mouth at bedtime.     No current facility-administered medications for this visit.    Review of Systems: GENERAL: negative for malaise, night sweats HEENT: No changes in hearing or vision, no nose bleeds or other nasal problems. NECK: Negative for lumps, goiter, pain and significant neck swelling RESPIRATORY: Negative for cough, wheezing CARDIOVASCULAR: Negative for chest pain, leg swelling, palpitations, orthopnea GI: SEE HPI MUSCULOSKELETAL: Negative for joint pain or swelling, back pain, and muscle pain. SKIN: Negative for lesions, rash PSYCH: Negative  for sleep disturbance, mood disorder and recent psychosocial stressors. HEMATOLOGY Negative for prolonged bleeding, bruising easily, and swollen nodes. ENDOCRINE: Negative for cold or heat intolerance, polyuria, polydipsia and goiter. NEURO: negative for tremor, gait imbalance, syncope and seizures. The remainder of the review of systems is noncontributory.   Physical Exam: BP 129/86 (BP Location: Left Arm, Patient Position: Sitting, Cuff Size: Normal)   Pulse (!) 106   Temp 98.2 F (36.8 C) (Oral)   Ht 5\' 11"  (1.803 m)   Wt 150 lb (68 kg)   BMI 20.92 kg/m  GENERAL: The patient is awake, unclear if oriented, in no acute distress. Sitting in wheelchair. Conversant. HEENT: Head is normocephalic and atraumatic. EOMI are intact. Mouth is well hydrated and without lesions. NECK: Supple. No masses LUNGS: Clear to auscultation. No presence of rhonchi/wheezing/rales. Adequate chest expansion HEART: RRR,  normal s1 and s2. ABDOMEN: Soft, nontender, no guarding, no peritoneal signs, and nondistended. BS +. No masses. EXTREMITIES: Without any cyanosis, clubbing, rash, lesions or edema. NEUROLOGIC:  no focal motor deficit. SKIN: no jaundice, no rashes  Imaging/Labs: as above  I personally reviewed and interpreted the available labs, imaging and endoscopic files.  Impression and Plan: Daniel [name] is a 70 y.o. male with past medical history of diabetes, hyperlipidemia, hypertension, cognitive impairment and CVA, who presents for follow up after hospitalization for gastrointestinal bleeding.  Had a significant drop in his hemoglobin during his most recent hospitalization, was found to have nonbleeding gastric ulcers in his stomach with negative biopsies for H. pylori.  Also was found to have severe esophagitis that could aggravate his gastrointestinal bleeding.  Since then, he has not presented any more episodes of overt gastrointestinal bleeding while on PPI twice daily.  He should continue taking the medication for 2 more months, and then he should be on pantoprazole 40 mg indefinitely afterwards due to the presence of esophagitis.  At this point, we will check for H. pylori serology as he was on a PPI when the biopsies were taken and this could lead to false negatives.  We will also check a repeat CBC and iron profile to assess if he has responded to the oral iron supplementation.  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  I recommended him to be evaluated by one of our advanced endoscopist at [facility] for potential resection of the mass.  She agreed with this.  I will reach the [place] team at [facility] to discuss this further and will notify the guardian about the decision.  If they were to perform the resection, they can also assess the esophagitis and gastric ulcer healing at that time.  Finally, the patient had subtle changes in his pancreas  showing borderline dilated pancreatic duct.  Further characterization of this alteration with an MRCP was limited due to motion artifact.  I will proceed with a CT pancreas protocol to evaluate this alteration further.  -Schedule CT pancreas with IV contrast -Check CBC, iron stores and H. Pylori serology -Will discuss case with advanced endoscopist at [facility] for potential referral for duondenal mass resection -Continue pantoprazole 40 mg twice a day for 2 months, then continue indefinitely -Continue oral iron  All questions were answered.      Harvel Quale, MD Gastroenterology and Hepatology [facility]

## 2020-07-24 NOTE — Patient Instructions (Signed)
Schedule CT pancreas with IV contrast Perform blood workup Will discuss case with advanced endoscopist at [facility] for potential referral for duondenal mass resection Continue pantoprazole 40 mg twice a day for 2 months, then continue indefinitely Continue oral iron

## 2020-07-25 ENCOUNTER — Telehealth: Payer: Self-pay

## 2020-07-25 NOTE — Telephone Encounter (Signed)
----- Message from Irving Copas., MD sent at 07/25/2020 10:28 AM EST ----- Regarding: RE: Duodenal polyp [name], Thanks for update. Please move forward with clinic visit and we will schedule EGD with EMR follow-up after the clinic visit.  We will ensure that the social worker/guardian is present. Thanks. GM ----- Message ----- From: Harvel Quale, MD Sent: 07/25/2020   7:41 AM EST To: Timothy Lasso, RN, Irving Copas., MD Subject: RE: Duodenal polyp                             Thanks so much for your prompt reply. Yes, I discussed with them about the EGD and possible resection yesterday, as well as to assess the esophagitis and ulcer healing and both were in agreement. The social worker is his legal guardian and she was interested in pursuing this.  Mayeli Bornhorst, can you please schedule him for a clinic visit withb Dr. Rush Landmark?  Thanks,  [name], MD Gastroenterology and Hepatology [facility]   ----- Message ----- From: Irving Copas., MD Sent: 07/24/2020   5:10 PM EST To: Timothy Lasso, RN, # Subject: RE: Duodenal polyp                             [name], Happy to be of assistance in meeting the patient and then talking with him about follow-up/repeat EGD and potential resection of that lesion if they so desire.  Please let us know if they are interested and if they are then we can have the patient and his guardian meet me in clinic and then we can move forward with getting him scheduled in the coming months.  Certainly will want time for the gastric ulcers which were likely the etiology of his issues to heal up so 3 to 4 months sounds reasonable for follow-up. Just let [name] and I know by replying once you have an opportunity to see what they want. Thanks. GM ----- Message ----- From: Harvel Quale, MD Sent: 07/24/2020   5:04 PM EST To: Irving Copas., MD Subject: Duodenal polyp                                  Hi Gabe, I hope this finds you well. Wanted to get your opinion regarding this case.  Is a patient that was admitted to the hospital after presenting upper gastrointestinal bleeding due to gastric ulcers while taking aspirin and Plavix due to a prior history of stroke.  During his endoscopy he was found to have what was described as a mass in the second portion of the duodenum in front of the ampulla.  I saw the pictures and it looks he has adenomatous features but I cannot tell for sure if it has any depression.  He saw me in the clinic for the first time today.  I spoke with his guardian who is interested in having this resected.  I explained to them that the option will be performed in endoscopic mucosal resection.  They are interested in having you and your team involved in this procedure and are open to see you in the clinic to discuss this further.  Please let me know what your thoughts behind it so we can go ahead with the referral.  Thanks,   [name]

## 2020-07-25 NOTE — Telephone Encounter (Signed)
-----   Message from Harvel Quale, MD sent at 07/25/2020  7:38 AM EST ----- Regarding: RE: Duodenal polyp Thanks so much for your prompt reply. Yes, I discussed with them about the EGD and possible resection yesterday, as well as to assess the esophagitis and ulcer healing and both were in agreement. The social worker is his legal guardian and she was interested in pursuing this.  Latarsha Zani, can you please schedule him for a clinic visit withb Dr. Rush Landmark?  Thanks,  [name], MD Gastroenterology and Hepatology [facility]   ----- Message ----- From: Irving Copas., MD Sent: 07/24/2020   5:10 PM EST To: Timothy Lasso, RN, # Subject: RE: Duodenal polyp                             [name], Happy to be of assistance in meeting the patient and then talking with him about follow-up/repeat EGD and potential resection of that lesion if they so desire.  Please let us know if they are interested and if they are then we can have the patient and his guardian meet me in clinic and then we can move forward with getting him scheduled in the coming months.  Certainly will want time for the gastric ulcers which were likely the etiology of his issues to heal up so 3 to 4 months sounds reasonable for follow-up. Just let Shota Kohrs and I know by replying once you have an opportunity to see what they want. Thanks. GM ----- Message ----- From: Harvel Quale, MD Sent: 07/24/2020   5:04 PM EST To: Irving Copas., MD Subject: Duodenal polyp                                 Hi Gabe, I hope this finds you well. Wanted to get your opinion regarding this case.  Is a patient that was admitted to the hospital after presenting upper gastrointestinal bleeding due to gastric ulcers while taking aspirin and Plavix due to a prior history of stroke.  During his endoscopy he was found to have what was described as a mass in the second portion of the  duodenum in front of the ampulla.  I saw the pictures and it looks he has adenomatous features but I cannot tell for sure if it has any depression.  He saw me in the clinic for the first time today.  I spoke with his guardian who is interested in having this resected.  I explained to them that the option will be performed in endoscopic mucosal resection.  They are interested in having you and your team involved in this procedure and are open to see you in the clinic to discuss this further.  Please let me know what your thoughts behind it so we can go ahead with the referral.  Thanks,   [name]

## 2020-07-26 ENCOUNTER — Encounter (INDEPENDENT_AMBULATORY_CARE_PROVIDER_SITE_OTHER): Payer: Self-pay

## 2020-07-26 NOTE — Telephone Encounter (Signed)
Appt made for 09/04/20 at 950 am with Dr Rush Landmark.  Call placed to [place] at Social Service. Left message on machine to call back   Pt is currently residing at the [facility] in [place].

## 2020-07-27 NOTE — Telephone Encounter (Signed)
Notified [name] at the group home where the pt is currently residing of  the appt information with Dr Rush Landmark.  She will also pass this on to [facility].

## 2020-08-24 ENCOUNTER — Ambulatory Visit (HOSPITAL_COMMUNITY)
Admission: RE | Admit: 2020-08-24 | Discharge: 2020-08-24 | Disposition: A | Discharge status: Home / Self Care | Payer: Medicare Other | Source: Ambulatory Visit | Attending: Gastroenterology | Admitting: Gastroenterology

## 2020-08-24 ENCOUNTER — Other Ambulatory Visit: Payer: Self-pay

## 2020-08-24 DIAGNOSIS — Q453 Other congenital malformations of pancreas and pancreatic duct: Secondary | ICD-10-CM | POA: Insufficient documentation

## 2020-08-24 LAB — POCT I-STAT CREATININE: Creatinine, Ser: 0.7 mg/dL (ref 0.61–1.24)

## 2020-08-24 IMAGING — CT CT ABDOMEN WO/W CM
3 of 12 series · 11 of 46 positions shown, 17 images · IV contrast (Omnipaque or Isovue)
Comparison: CT the abdomen and pelvis [DATE]. MRI of the
abdomen with MRCP [DATE].

CLINICAL DATA: 69-year-old male with history of pancreatic ductal
dilatation noted on prior CT examination. Follow-up study.

EXAM:
CT ABDOMEN WITHOUT AND WITH CONTRAST
TECHNIQUE: Multidetector CT imaging of the abdomen was performed following the
standard protocol before and following the bolus administration of
intravenous contrast.
CONTRAST:  100mL OMNIPAQUE IOHEXOL 300 MG/ML  SOLN

[Series 6: axial arterial · axial · arterial · 0.86mm/px · z∈[+1047,+1182]mm · 3 of 91 slices shown]
[im 23/91  soft-tissue]
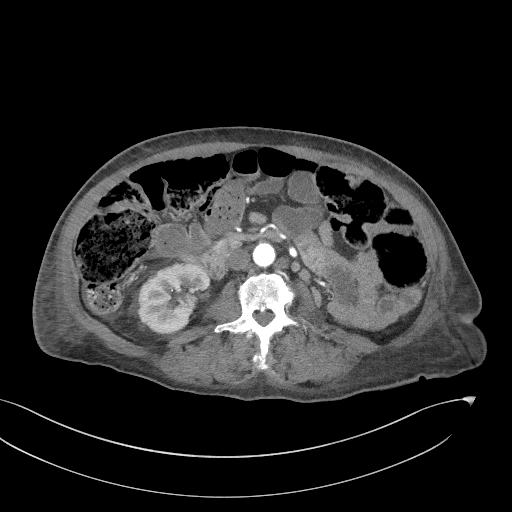
[im 46/91  soft-tissue]
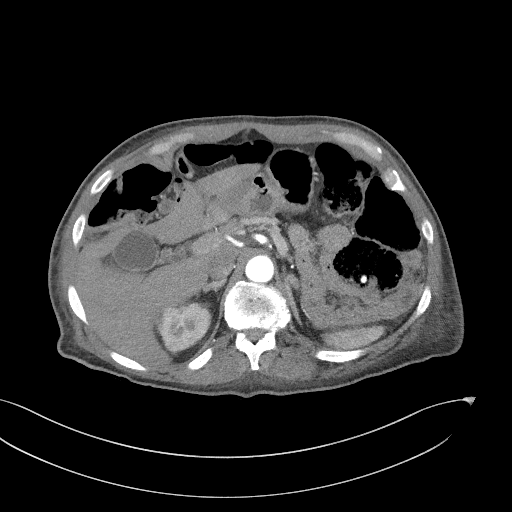
[im 68/91  soft-tissue]
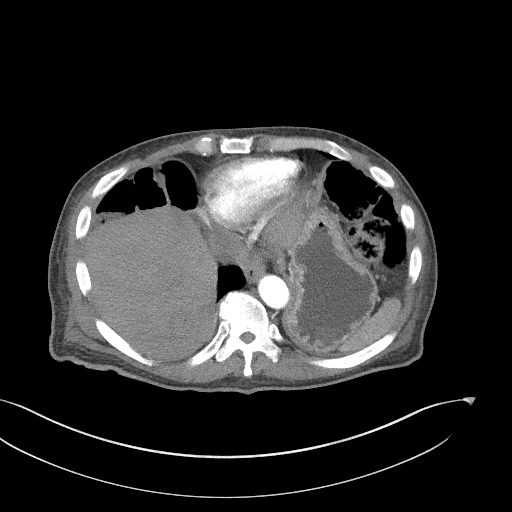

[Series 9: coronal arterial · coronal · arterial · 0.58mm/px · 3 of 89 slices shown, 4 images]
[im 23/89  soft-tissue]
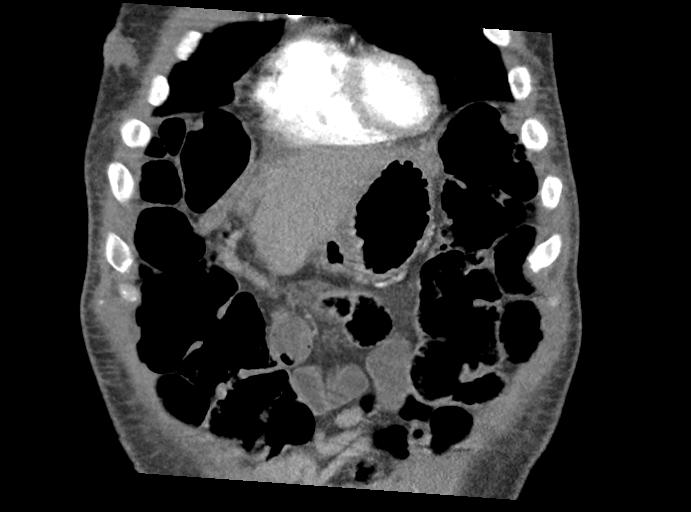
[im 45/89  soft-tissue]
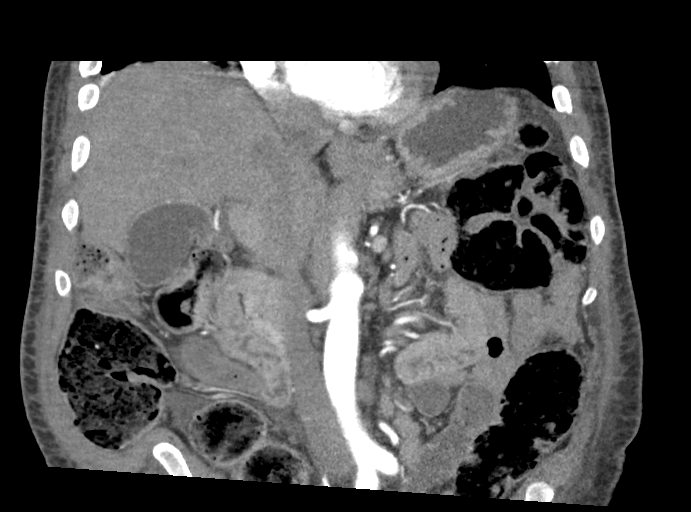
[im 45/89  bone]
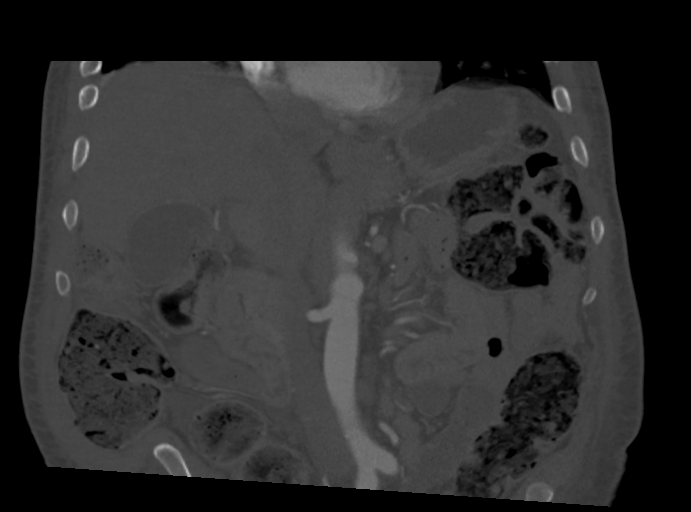
[im 67/89  soft-tissue]
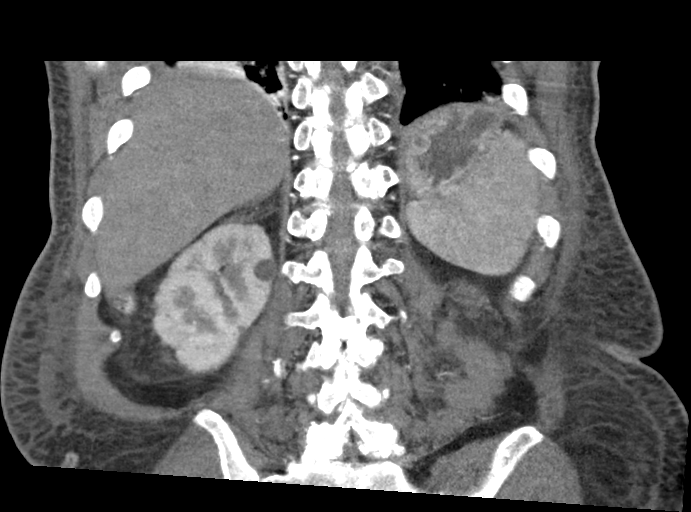

[Series 13: pancreas pv 2.0 · axial · portal-venous · 0.86mm/px · z∈[+1025,+1205]mm · 5 of 136 slices shown, 10 images]
[im 23/136  soft-tissue]
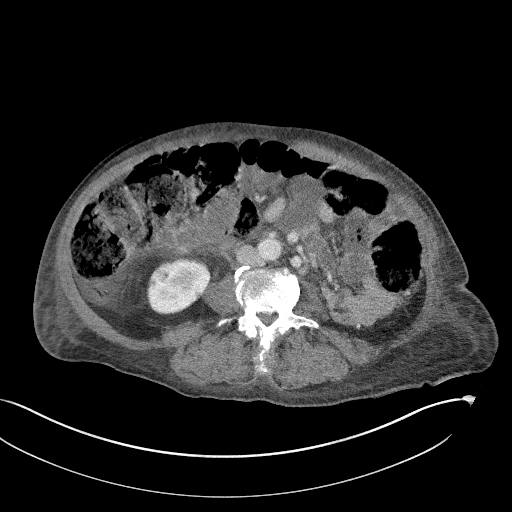
[im 23/136  bone]
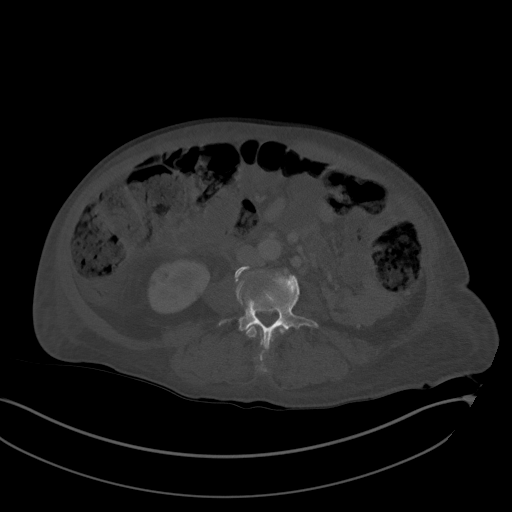
[im 46/136  soft-tissue]
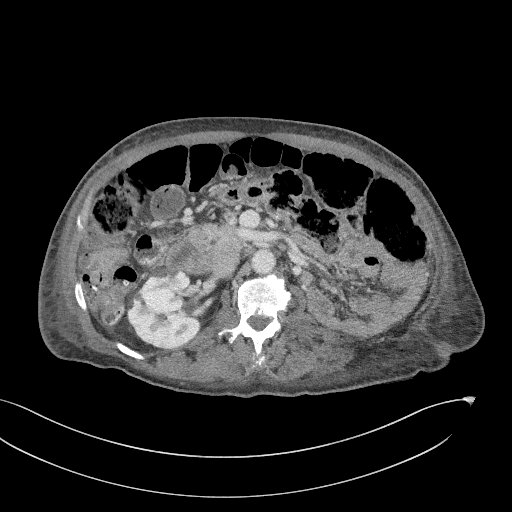
[im 46/136  lung]
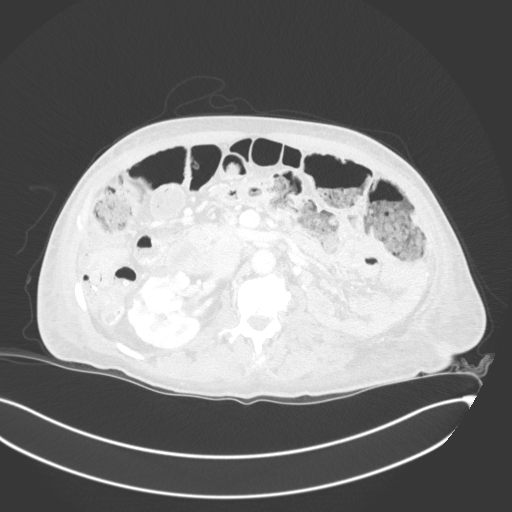
[im 68/136  soft-tissue]
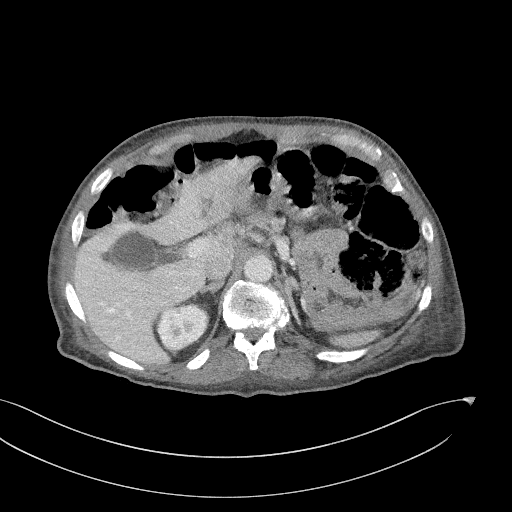
[im 68/136  lung]
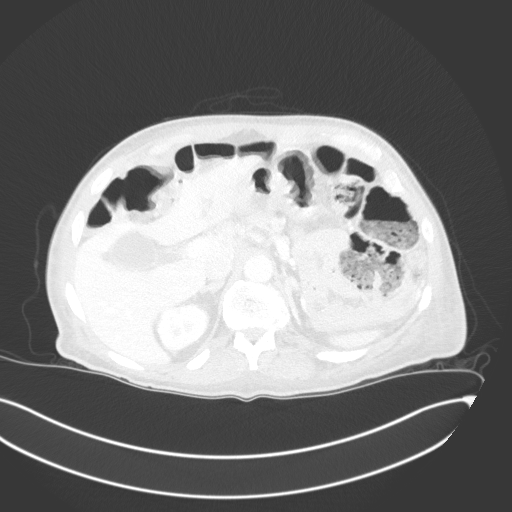
[im 91/136  soft-tissue]
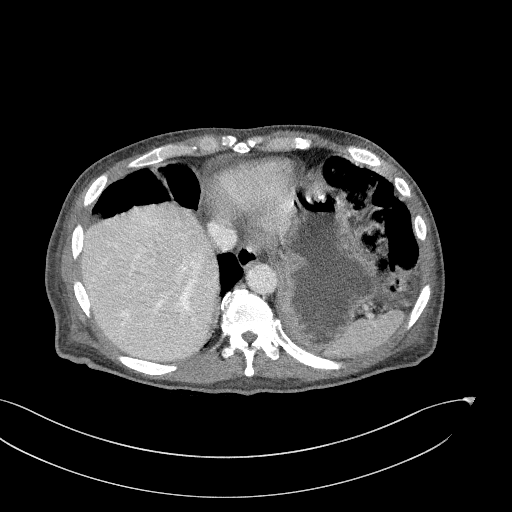
[im 91/136  lung]
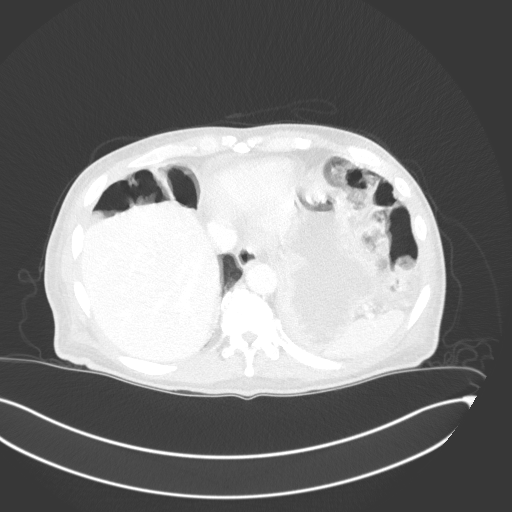
[im 113/136  soft-tissue]
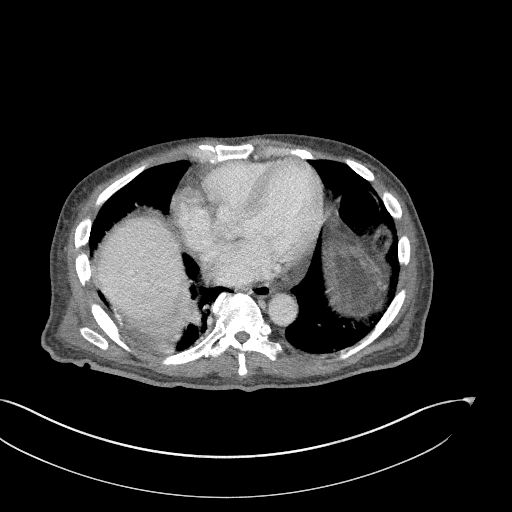
[im 113/136  lung]
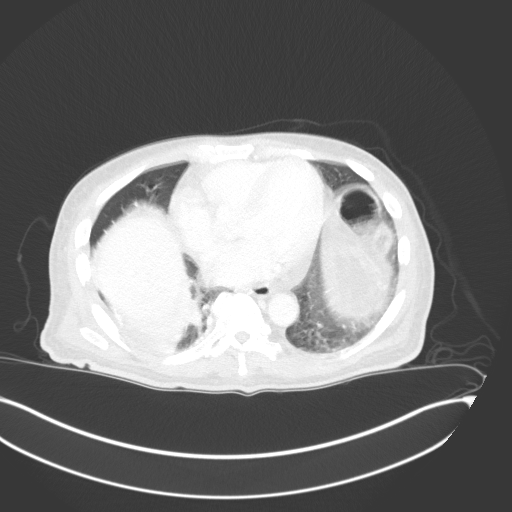

[11 of 46 positions shown; findings below may reference images not displayed]

FINDINGS: Lower chest: Areas of subsegmental atelectasis or scarring are noted
in the right lung base.

Hepatobiliary: No suspicious cystic or solid hepatic lesions. No
intra or extrahepatic biliary ductal dilatation. Gallbladder is
normal in appearance.

Pancreas: No pancreatic mass. No pancreatic ductal dilatation. No
pancreatic or peripancreatic fluid collections or inflammatory
changes. Atrophy or congenital absence of the tail of the pancreas
incidentally noted.

Spleen: Unremarkable.

Adrenals/Urinary Tract: Left kidney is not visualized, either
congenitally or surgically absent. No surgical clips are noted in
the left retroperitoneum. Multiple subcentimeter low-attenuation
lesions in the right kidney, too small to definitively characterize,
but likely to represent cysts based on their appearance on prior
abdominal MRI. Small nonobstructive calculi are present within the
right renal collecting system measuring up to 5 mm in the interpolar
region. No suspicious right renal lesions. No right
hydroureteronephrosis in the visualized portions of the abdomen.
Bilateral adrenal glands are normal in appearance.

Stomach/Bowel: Normal appearance of the stomach. No pathologic
dilatation of the visualized portions of small bowel or colon.

Vascular/Lymphatic: No aneurysm identified in the visualized
abdominal vasculature. No lymphadenopathy noted in the abdomen.

Other: Small umbilical hernia containing only omental fat. No
significant volume of ascites and no pneumoperitoneum noted in the
visualized portions of the peritoneal cavity.

Musculoskeletal: No aggressive appearing osseous lesions are noted
in the visualized portions of the skeleton. Status post left hip
arthroplasty.
IMPRESSION: 1. No pancreatic ductal dilatation.
2. No acute findings in the abdomen.
3. Incidental findings, as above.

## 2020-08-24 MED ORDER — IOHEXOL 300 MG/ML  SOLN
100.0000 mL | Freq: Once | INTRAMUSCULAR | Status: AC | PRN
  Administered 2020-08-24: 100 mL via INTRAVENOUS

## 2020-08-27 ENCOUNTER — Encounter (HOSPITAL_COMMUNITY): Payer: Self-pay

## 2020-08-27 NOTE — Progress Notes (Signed)
Unable to reach the patient directly for introductory phone call as he is a resident at the [facility]. Will plan to meet with the patient during initial visit with Dr. Delton Coombes

## 2020-08-28 ENCOUNTER — Inpatient Hospital Stay (HOSPITAL_COMMUNITY)
Admission: EM | Admit: 2020-08-28 | Discharge: 2020-08-30 | DRG: 641 | Disposition: A | Discharge status: Skilled Nursing Facility | Payer: Medicare Other | Attending: Family Medicine | Admitting: Family Medicine

## 2020-08-28 ENCOUNTER — Other Ambulatory Visit: Payer: Self-pay

## 2020-08-28 DIAGNOSIS — K209 Esophagitis, unspecified without bleeding: Secondary | ICD-10-CM | POA: Diagnosis present

## 2020-08-28 DIAGNOSIS — Z7401 Bed confinement status: Secondary | ICD-10-CM

## 2020-08-28 DIAGNOSIS — Z79899 Other long term (current) drug therapy: Secondary | ICD-10-CM

## 2020-08-28 DIAGNOSIS — J9611 Chronic respiratory failure with hypoxia: Secondary | ICD-10-CM | POA: Diagnosis present

## 2020-08-28 DIAGNOSIS — K259 Gastric ulcer, unspecified as acute or chronic, without hemorrhage or perforation: Secondary | ICD-10-CM | POA: Diagnosis present

## 2020-08-28 DIAGNOSIS — Z8711 Personal history of peptic ulcer disease: Secondary | ICD-10-CM

## 2020-08-28 DIAGNOSIS — I69391 Dysphagia following cerebral infarction: Secondary | ICD-10-CM

## 2020-08-28 DIAGNOSIS — E7800 Pure hypercholesterolemia, unspecified: Secondary | ICD-10-CM | POA: Diagnosis present

## 2020-08-28 DIAGNOSIS — K3189 Other diseases of stomach and duodenum: Secondary | ICD-10-CM | POA: Diagnosis present

## 2020-08-28 DIAGNOSIS — I159 Secondary hypertension, unspecified: Secondary | ICD-10-CM | POA: Diagnosis not present

## 2020-08-28 DIAGNOSIS — Z7984 Long term (current) use of oral hypoglycemic drugs: Secondary | ICD-10-CM

## 2020-08-28 DIAGNOSIS — F0390 Unspecified dementia without behavioral disturbance: Secondary | ICD-10-CM | POA: Diagnosis present

## 2020-08-28 DIAGNOSIS — R338 Other retention of urine: Secondary | ICD-10-CM | POA: Diagnosis present

## 2020-08-28 DIAGNOSIS — N401 Enlarged prostate with lower urinary tract symptoms: Secondary | ICD-10-CM | POA: Diagnosis present

## 2020-08-28 DIAGNOSIS — Z7982 Long term (current) use of aspirin: Secondary | ICD-10-CM

## 2020-08-28 DIAGNOSIS — D649 Anemia, unspecified: Secondary | ICD-10-CM | POA: Diagnosis present

## 2020-08-28 DIAGNOSIS — Z794 Long term (current) use of insulin: Secondary | ICD-10-CM

## 2020-08-28 DIAGNOSIS — Z7902 Long term (current) use of antithrombotics/antiplatelets: Secondary | ICD-10-CM

## 2020-08-28 DIAGNOSIS — E785 Hyperlipidemia, unspecified: Secondary | ICD-10-CM | POA: Diagnosis present

## 2020-08-28 DIAGNOSIS — R627 Adult failure to thrive: Secondary | ICD-10-CM | POA: Diagnosis present

## 2020-08-28 DIAGNOSIS — R1310 Dysphagia, unspecified: Secondary | ICD-10-CM | POA: Diagnosis present

## 2020-08-28 DIAGNOSIS — I10 Essential (primary) hypertension: Secondary | ICD-10-CM | POA: Diagnosis present

## 2020-08-28 DIAGNOSIS — E119 Type 2 diabetes mellitus without complications: Secondary | ICD-10-CM

## 2020-08-28 DIAGNOSIS — Z20822 Contact with and (suspected) exposure to covid-19: Secondary | ICD-10-CM | POA: Diagnosis present

## 2020-08-28 DIAGNOSIS — Z87891 Personal history of nicotine dependence: Secondary | ICD-10-CM

## 2020-08-28 DIAGNOSIS — E1165 Type 2 diabetes mellitus with hyperglycemia: Secondary | ICD-10-CM | POA: Diagnosis present

## 2020-08-28 LAB — CBC WITH DIFFERENTIAL/PLATELET
Abs Immature Granulocytes: 0.03 10*3/uL (ref 0.00–0.07)
Basophils Absolute: 0 10*3/uL (ref 0.0–0.1)
Basophils Relative: 0 %
Eosinophils Absolute: 0.1 10*3/uL (ref 0.0–0.5)
Eosinophils Relative: 2 %
HCT: 31.6 % — ABNORMAL LOW (ref 39.0–52.0)
Hemoglobin: 9.9 g/dL — ABNORMAL LOW (ref 13.0–17.0)
Immature Granulocytes: 1 %
Lymphocytes Relative: 16 %
Lymphs Abs: 1 10*3/uL (ref 0.7–4.0)
MCH: 27.8 pg (ref 26.0–34.0)
MCHC: 31.3 g/dL (ref 30.0–36.0)
MCV: 88.8 fL (ref 80.0–100.0)
Monocytes Absolute: 0.6 10*3/uL (ref 0.1–1.0)
Monocytes Relative: 9 %
Neutro Abs: 4.2 10*3/uL (ref 1.7–7.7)
Neutrophils Relative %: 72 %
Platelets: 209 10*3/uL (ref 150–400)
RBC: 3.56 MIL/uL — ABNORMAL LOW (ref 4.22–5.81)
RDW: 16 % — ABNORMAL HIGH (ref 11.5–15.5)
WBC: 5.9 10*3/uL (ref 4.0–10.5)
nRBC: 0 % (ref 0.0–0.2)

## 2020-08-28 LAB — COMPREHENSIVE METABOLIC PANEL
ALT: 17 U/L (ref 0–44)
AST: 14 U/L — ABNORMAL LOW (ref 15–41)
Albumin: 2.5 g/dL — ABNORMAL LOW (ref 3.5–5.0)
Alkaline Phosphatase: 97 U/L (ref 38–126)
Anion gap: 13 (ref 5–15)
BUN: 16 mg/dL (ref 8–23)
CO2: 26 mmol/L (ref 22–32)
Calcium: 9.6 mg/dL (ref 8.9–10.3)
Chloride: 96 mmol/L — ABNORMAL LOW (ref 98–111)
Creatinine, Ser: 0.9 mg/dL (ref 0.61–1.24)
GFR, Estimated: >60 mL/min (ref >60)
Glucose, Bld: 162 mg/dL — ABNORMAL HIGH (ref 70–99)
Potassium: 3.5 mmol/L (ref 3.5–5.1)
Sodium: 135 mmol/L (ref 135–145)
Total Bilirubin: 0.2 mg/dL — ABNORMAL LOW (ref 0.3–1.2)
Total Protein: 5.7 g/dL — ABNORMAL LOW (ref 6.5–8.1)

## 2020-08-28 LAB — MAGNESIUM: Magnesium: 0.7 mg/dL — CL (ref 1.7–2.4)

## 2020-08-28 MED ORDER — MAGNESIUM SULFATE 2 GM/50ML IV SOLN: 2.0000 g | Freq: Once | INTRAVENOUS | Status: DC

## 2020-08-28 MED ORDER — MAGNESIUM SULFATE 2 GM/50ML IV SOLN
2.0000 g | Freq: Once | INTRAVENOUS | Status: AC
  Administered 2020-08-28: 2 g via INTRAVENOUS
  Filled 2020-08-28: qty 50

## 2020-08-28 MED ORDER — POTASSIUM CHLORIDE 20 MEQ PO PACK
40.0000 meq | PACK | Freq: Once | ORAL | Status: AC
  Administered 2020-08-28: 40 meq via ORAL
  Filled 2020-08-28: qty 2

## 2020-08-28 NOTE — ED Notes (Signed)
Pt is a hard stick 

## 2020-08-28 NOTE — ED Provider Notes (Signed)
  Face-to-face evaluation   History: He presents for evaluation of low magnesium, found during routine testing.  This is a recurrent problem.  Level 5 caveat-poor historian/dementia  Physical exam: Elderly male, alert and cooperative.  Responsive and cooperative.  No dysarthria or aphasia.  Abdomen soft nontender palpation.  No respiratory distress.  Medical screening examination/treatment/procedure(s) were conducted as a shared visit with non-physician practitioner(s) and myself.  I personally evaluated the patient during the encounter    Daleen Bo, MD 08/30/20 [phone]

## 2020-08-28 NOTE — ED Notes (Signed)
Able to establish IV access. Pt mag running at this time.

## 2020-08-28 NOTE — ED Triage Notes (Signed)
[facility] sent pt over stating mag level was low

## 2020-08-28 NOTE — H&P (Signed)
History and Physical    Daniel [name] BTD:[id number] DOB: 1951/01/25 DOA: 08/28/2020  PCP: Leonie Douglas, MD   Patient coming from: Home  I have personally briefly reviewed patient's old medical records in [place]  Chief Complaint: Abnormal Labs  HPI: Daniel [name] is a 70 y.o. male with medical history significant for HTN, DM, dementia, esophagitis, gastric ulcers and duodenal mass.  History is limited from patient due to dementia and speech difficult to understand. Patient is able to answer simple questions, tell me his name and ask how long he will be in the hospital.  He denies vomiting, no loose stools.  He denies pain.  Recent hospitalization 1/11 - 1 18 -for abdominal pain, EGD showed severe esophagitis, 4 gastric ulcers with erosions, largest ulcer 2.5 cm, biopsies were negative for H. pylori or dysplasia.  Also duodenal mass seen, not biopsied as patient was on Plavix.  Incidental finding of dilated pancreatic duct, MRCP limited by motion, to follow-up as outpatient.  Patient was started on twice daily PPI and Carafate.  ED Course: Stable vitals.  Magnesium 0.7, potassium 3.5.  Calcium 9.6. Supplementation started with 2 g of magnesium sulfate.  Hospitalist to admit.  Review of Systems: As per HPI all other systems reviewed and negative.  Past Medical History:  Diagnosis Date  . Diabetes mellitus without complication ([place])   . High cholesterol   . Hypertension     Past Surgical History:  Procedure Laterality Date  . BIOPSY  06/21/2020   Procedure: BIOPSY;  Surgeon: Daneil Dolin, MD;  [facility];  Service: Endoscopy;;  gastric  . ESOPHAGOGASTRODUODENOSCOPY (EGD) WITH PROPOFOL N/A 06/21/2020   Procedure: ESOPHAGOGASTRODUODENOSCOPY (EGD) WITH PROPOFOL;  Surgeon: Daneil Dolin, MD;  [facility]: [facility];  Service: Endoscopy;  Laterality: N/A;  . HIP SURGERY     left  . KIDNEY SURGERY       reports that he has never smoked. He has never used  smokeless tobacco. He reports that he does not drink alcohol and does not use drugs.  No Known Allergies  Family History  Family history unknown: Yes    Prior to Admission medications   Medication Sig Start Date End Date Taking? Authorizing Provider  amitriptyline (ELAVIL) 10 MG tablet Take 10 mg by mouth at bedtime. 04/30/20  Yes [provider]  atorvastatin (LIPITOR) 40 MG tablet Take 1 tablet (40 mg total) by mouth daily. 01/07/20 02/06/20 Yes Pahwani, Einar Grad, MD  Cholecalciferol (VITAMIN D3) 2000 units capsule TAKE 1 CAPSULE BY MOUTH ONCE DAILY. **DO NOT CRUSH** Patient taking differently: Take 2,000 Units by mouth daily. 01/06/18  Yes Fayrene Helper, MD  donepezil (ARICEPT) 10 MG tablet TAKE (1) TABLET BY MOUTH ONCE DAILY. Patient taking differently: Take 10 mg by mouth daily. 06/15/17  Yes Raylene Everts, MD  dronabinol (MARINOL) 2.5 MG capsule Take 2.5 mg by mouth 2 (two) times daily before lunch and supper.   Yes [provider]  feeding supplement (ENSURE ENLIVE / ENSURE PLUS) LIQD Take 237 mLs by mouth 2 (two) times daily between meals. 06/22/20  Yes Shah, Pratik D, DO  FEROSUL 325 (65 Fe) MG tablet TAKE 1 TABLET BY MOUTH TWICE DAILY. Patient taking differently: Take 325 mg by mouth in the morning and at bedtime. 06/15/17  Yes Raylene Everts, MD  folic acid (FOLVITE) 106 MCG tablet Take 800 mcg by mouth daily.   Yes [provider]  magnesium oxide (MAG-OX) 400 MG  tablet Take 2 tablets (800 mg total) by mouth 2 (two) times daily. 06/26/20  Yes Shah, Pratik D, DO  memantine (NAMENDA) 10 MG tablet Take 10 mg by mouth 2 (two) times daily. 06/07/20  Yes [provider]  metFORMIN (GLUCOPHAGE) 1000 MG tablet Take 1 tablet (1,000 mg total) by mouth 2 (two) times daily with a meal. 06/08/20  Yes Barton Dubois, MD  metoprolol tartrate (LOPRESSOR) 25 MG tablet Take 1 tablet (25 mg total) by mouth 2 (two) times daily. 06/22/20 07/22/20 Yes Shah, Pratik D,  DO  ondansetron (ZOFRAN) 4 MG tablet Take 1 tablet (4 mg total) by mouth every 6 (six) hours as needed for nausea. 06/22/20  Yes Shah, Pratik D, DO  oxybutynin (DITROPAN-XL) 5 MG 24 hr tablet Take 5 mg by mouth daily. 01/04/20  Yes [provider]  OXYGEN Inhale 2 L into the lungs daily.   Yes [provider]  pantoprazole (PROTONIX) 40 MG tablet Take 1 tablet (40 mg total) by mouth 2 (two) times daily. 07/24/20 10/22/20 Yes Harvel Quale, MD  potassium chloride (KLOR-CON) 10 MEQ tablet Take 2 tablets (20 mEq total) by mouth daily. Patient taking differently: Take 20 mEq by mouth daily. For 15 days Course started on 1.1.21 06/08/20  Yes Barton Dubois, MD  Semaglutide,0.25 or 0.5MG /DOS, (OZEMPIC, 0.25 OR 0.5 MG/DOSE,) 2 MG/1.5ML SOPN Inject 0.5 mg into the skin once a week. 06/08/20 09/06/20 Yes Barton Dubois, MD  sucralfate (CARAFATE) 1 g tablet Take 1 tablet (1 g total) by mouth 4 (four) times daily. 06/22/20 06/22/21 Yes Shah, Pratik D, DO  tamsulosin (FLOMAX) 0.4 MG CAPS capsule TAKE 1 CAPSULE BY MOUTH AT BEDTIME. Patient taking differently: Take 0.4 mg by mouth at bedtime. 06/15/17  Yes Raylene Everts, MD  traZODone (DESYREL) 100 MG tablet Take 100 mg by mouth at bedtime. 01/04/20  Yes [provider]  vitamin B-12 (CYANOCOBALAMIN) 500 MCG tablet Take 1,000 mcg by mouth daily.   Yes [provider]  acetaminophen (TYLENOL) 650 MG CR tablet Take 650 mg by mouth 2 (two) times daily. Patient not taking: No sig reported    [provider]  ASPIRIN LOW DOSE 81 MG EC tablet Take 81 mg by mouth daily. Patient not taking: No sig reported 08/10/20   [provider]  clopidogrel (PLAVIX) 75 MG tablet Take 75 mg by mouth daily. Patient not taking: No sig reported 08/10/20   [provider]  gentian violet 1 % topical solution Apply 1 mL topically See admin instructions. Applied between toes daily Patient not taking: No sig reported     [provider]  insulin NPH-regular Human (NOVOLIN 70/30) (70-30) 100 UNIT/ML injection 4 units if blood sugar over 200; 6 units if blood sugar  over 240; 8 units if blood sugar over 280; 10 units if blood sugar over 320; 12 units if blood sugar over 360; Patient not taking: No sig reported 06/20/20   [provider]  ramipril (ALTACE) 10 MG capsule Take 2 capsules (20 mg total) by mouth daily. 06/08/20 06/16/20  Barton Dubois, MD    Physical Exam: Vitals:   08/28/20 1730 08/28/20 1802 08/28/20 1900 08/28/20 2000  BP: (!) 151/78  (!) 146/81 (!) 152/83  Pulse: 83  89 85  Resp: (!) 24  20 (!) 25  Temp:      SpO2: 100% 100% 100% 100%  Weight:      Height:        Constitutional: NAD, calm,  comfortable, chronically ill-appearing Vitals:   08/28/20 1730 08/28/20 1802 08/28/20 1900 08/28/20 2000  BP: (!) 151/78  (!) 146/81 (!) 152/83  Pulse: 83  89 85  Resp: (!) 24  20 (!) 25  Temp:      SpO2: 100% 100% 100% 100%  Weight:      Height:       Eyes: PERRL, lids and conjunctivae normal ENMT: Mucous membranes are moist.  Neck: normal, supple, no masses, no thyromegaly Respiratory: clear to auscultation bilaterally, no wheezing, no crackles. Normal respiratory effort. No accessory muscle use.  Cardiovascular: Regular rate and rhythm,. No extremity edema. 2+ pedal pulses.  Abdomen: no tenderness, no masses palpated. No hepatosplenomegaly. Bowel sounds positive.  Musculoskeletal: no clubbing / cyanosis. No joint deformity upper and lower extremities. Good ROM, no contractures. Normal muscle tone.  Skin: Dry scaly skin to lower extremities, no  lesions, ulcers. No induration Neurologic: hx of stroke, weakness to right upper and lower extremity,  Psychiatric: Alert and oriented to person.  Speech difficult to understand, limiting assessment.   Labs on Admission: I have personally reviewed following labs and imaging studies  CBC: Recent Labs  Lab 08/28/20 1854  WBC 5.9   NEUTROABS 4.2  HGB 9.9*  HCT 31.6*  MCV 88.8  PLT 160   Basic Metabolic Panel: Recent Labs  Lab 08/24/20 1120 08/28/20 1854  NA  --  135  K  --  3.5  CL  --  96*  CO2  --  26  GLUCOSE  --  162*  BUN  --  16  CREATININE 0.70 0.90  CALCIUM  --  9.6  MG  --  0.7*   GFR: Estimated Creatinine Clearance: 76.6 mL/min (by C-G formula based on SCr of 0.9 mg/dL). Liver Function Tests: Recent Labs  Lab 08/28/20 1854  AST 14*  ALT 17  ALKPHOS 97  BILITOT 0.2*  PROT 5.7*  ALBUMIN 2.5*    Radiological Exams on Admission: No results found.  EKG: Independently reviewed.   Assessment/Plan Principal Problem:   Hypomagnesemia Active Problems:   Hypertension   Dementia (HCC)   Multiple gastric ulcers   Duodenal mass  Hypomagnesemia-recurrent issue, magnesium 0.7 today.  Down to 0.6 and 0.9 this past December January respectively.  Was started on BID PPI during last hospitalization, but episodes of hypomagnesemia predates initiation of PPI.  On daily oral supplementation.  Calcium normal at 9.6.  Potassium 3.5.   - Replete magnesium - Recheck in the morning - Check Urine magnesium and urine mag, may help determine etiology if GI or renal losses. -Consider nephrology evaluation -  Resume home oral magnesium supplementation, consider increasing dose on d/c - N/s + 40 KCL 75cc/hr x 15hrs  Severe esophagitis, gastric ulcers, duodenal mass-as seen on EGD 06/21/2020.  Biopsy not done on duodenal biopsy as patient was on Plavix.  Hemoglobin stable 9.9. -Continue Protonix for now 40 twice daily, doubt H2 blockers as an alternative to PPI will be effective. -Resume Carafate  Uncontrolled DM- random glucose 162, Hgba1c - 9.1.  - SSI-  M - Hold Metformin and semaglutide for now  BPH -Continue Flomax - also on oxybutynin will continue  Dementia - Resume Aricept and Namenda, nightly trazodone   CVA- Plavix was held during recent hospitalization pending further  recommendations.  hyperlipidemia -Continue Lipitor.  Hypertension - Resume metoprolol  Incidental dilated pancreatic duct, MRI limited by motion, no mass seen. -Follow-up as outpatient  Chronic respiratory failure on 2 L.  DVT  prophylaxis: Lovenox Code Status: Full code Family Communication: None at bedside Disposition Plan: ~ 1 day Consults called: None Admission status: Obs, tele   Bethena Roys MD [facility]  08/28/2020, 8:43 PM

## 2020-08-28 NOTE — ED Provider Notes (Signed)
[facility] EMERGENCY DEPARTMENT Provider Note   CSN: [id number] Arrival date & time: 08/28/20  1626     History Chief Complaint  Patient presents with  . Abnormal Lab    Mag    Daniel [name] is a 70 y.o. male.  The history is provided by medical records and the patient. History limited by: dementia.  Abnormal Lab Time since result:  Drawn early this am Patient referred by:  Nursing home Result type: chemistry   Chemistry:    Other abnormal chemistry result:  Magnesium low at 0.6  Pt with history of DM, HTN, hypercholesterolemia, dysphagia and weakness secondary to CVA and dementia  presenting from [facility] in [place] for treatment of hypomagnesemia.  Review of chart indicates he has had this problem in the past, most recently was seen at [facility] on 08/07/20 for a magnesium level of 0.5 mg/dL.  He has no complaint of chest pain or palpitations.  No history or documentation of seizure like symptoms, but hx limited by dementia.     Past Medical History:  Diagnosis Date  . Diabetes mellitus without complication (Daniel [name])   . High cholesterol   . Hypertension     Patient Active Problem List   Diagnosis Date Noted  . Multiple gastric ulcers 07/24/2020  . Esophagitis 07/24/2020  . 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 Abdominal pain 06/20/2020  . Normochromic normocytic anemia   . Upper abdominal pain   . Abnormal CT scan, esophagus   . Abnormal CT scan, stomach   . Pancreatic abnormality   . Constipation   . Hyperglycemia   . History of stroke   . Hypokalemia   . Hypomagnesemia   . AKI (acute kidney injury) ([place]) 06/06/2020  . Acute CVA (cerebrovascular accident) ([place]) 01/05/2020  . Right knee pain 01/01/2017  . Osteoarthritis of right knee 01/01/2017  . Mental impairment 09/23/2016  . Hypertension 09/23/2016  . Type 2 diabetes mellitus without complication, without long-term current use of insulin ([name]) 09/23/2016  . BPH (benign prostatic hyperplasia) 09/23/2016  .  Dementia (Daniel [name]) 09/23/2016  . 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 BIOPSY  06/21/2020   Procedure: BIOPSY;  Surgeon: Daneil Dolin, MD;  [facility];  Service: Endoscopy;;  gastric  . ESOPHAGOGASTRODUODENOSCOPY (EGD) WITH PROPOFOL N/A 06/21/2020   Procedure: ESOPHAGOGASTRODUODENOSCOPY (EGD) WITH PROPOFOL;  Surgeon: Daneil Dolin, MD;  [facility];  Service: Endoscopy;  Laterality: N/A;  . HIP SURGERY     left  . KIDNEY SURGERY         Family History  Family history unknown: Yes    Social History   Tobacco Use  . Smoking status: Never Smoker  . Smokeless tobacco: Never Used  Vaping Use  . Vaping Use: Never used  Substance Use Topics  . Alcohol use: No  . Drug use: No    Home Medications Prior to Admission medications   Medication Sig Start Date End Date Taking? Authorizing Provider  acetaminophen (TYLENOL) 650 MG CR tablet Take 650 mg by mouth 2 (two) times daily.    [provider]  amitriptyline (ELAVIL) 10 MG tablet Take 10 mg by mouth at bedtime. 04/30/20   [provider]  atorvastatin (LIPITOR) 40 MG tablet Take 1 tablet (40 mg total) by mouth daily. 01/07/20 02/06/20  Darliss Cheney, MD  Cholecalciferol (VITAMIN D3) 2000 units capsule TAKE 1 CAPSULE BY MOUTH ONCE DAILY. **DO  NOT CRUSH** Patient taking differently: Take 2,000 Units by mouth daily. 01/06/18   Fayrene Helper, MD  donepezil (ARICEPT) 10 MG tablet TAKE (1) TABLET BY MOUTH ONCE DAILY. Patient taking differently: Take 10 mg by mouth daily. 06/15/17   Raylene Everts, MD  dronabinol (MARINOL) 2.5 MG capsule Take 2.5 mg by mouth 2 (two) times daily before lunch and supper.    [provider]  feeding supplement (ENSURE ENLIVE / ENSURE PLUS) LIQD Take 237 mLs by mouth 2 (two) times daily between meals. 06/22/20   Manuella Ghazi, Pratik D, DO  FEROSUL 325 (65 Fe) MG tablet TAKE 1 TABLET BY MOUTH TWICE  DAILY. Patient taking differently: Take 325 mg by mouth in the morning and at bedtime. 06/15/17   Raylene Everts, MD  folic acid (FOLVITE) 253 MCG tablet Take 800 mcg by mouth daily.    [provider]  gentian violet 1 % topical solution Apply 1 mL topically See admin instructions. Applied between toes daily Patient not taking: Reported on 07/24/2020    [provider]  magnesium oxide (MAG-OX) 400 MG tablet Take 2 tablets (800 mg total) by mouth 2 (two) times daily. 06/26/20   Manuella Ghazi, Pratik D, DO  memantine (NAMENDA) 10 MG tablet Take 10 mg by mouth 2 (two) times daily. 06/07/20   [provider]  metFORMIN (GLUCOPHAGE) 1000 MG tablet Take 1 tablet (1,000 mg total) by mouth 2 (two) times daily with a meal. 06/08/20   Barton Dubois, MD  metoprolol tartrate (LOPRESSOR) 25 MG tablet Take 1 tablet (25 mg total) by mouth 2 (two) times daily. 06/22/20 07/22/20  Manuella Ghazi, Pratik D, DO  ondansetron (ZOFRAN) 4 MG tablet Take 1 tablet (4 mg total) by mouth every 6 (six) hours as needed for nausea. Patient not taking: Reported on 07/24/2020 06/22/20   Heath Lark D, DO  oxybutynin (DITROPAN-XL) 5 MG 24 hr tablet Take 5 mg by mouth daily. 01/04/20   [provider]  pantoprazole (PROTONIX) 40 MG tablet Take 1 tablet (40 mg total) by mouth 2 (two) times daily. 07/24/20 10/22/20  Harvel Quale, MD  potassium chloride (KLOR-CON) 10 MEQ tablet Take 2 tablets (20 mEq total) by mouth daily. Patient taking differently: Take 20 mEq by mouth daily. For 15 days Course started on 1.1.21 06/08/20   Barton Dubois, MD  Semaglutide,0.25 or 0.5MG /DOS, (OZEMPIC, 0.25 OR 0.5 MG/DOSE,) 2 MG/1.5ML SOPN Inject 0.5 mg into the skin once a week. 06/08/20 09/06/20  Barton Dubois, MD  sucralfate (CARAFATE) 1 g tablet Take 1 tablet (1 g total) by mouth 4 (four) times daily. Patient not taking: Reported on 07/24/2020 06/22/20 06/22/21  Heath Lark D, DO  tamsulosin (FLOMAX) 0.4 MG CAPS capsule  TAKE 1 CAPSULE BY MOUTH AT BEDTIME. Patient taking differently: Take 0.4 mg by mouth at bedtime. 06/15/17   Raylene Everts, MD  traZODone (DESYREL) 100 MG tablet Take 100 mg by mouth at bedtime. 01/04/20   [provider]  vitamin B-12 (CYANOCOBALAMIN) 500 MCG tablet Take 1,000 mcg by mouth daily.    [provider]  ramipril (ALTACE) 10 MG capsule Take 2 capsules (20 mg total) by mouth daily. 06/08/20 06/16/20  Barton Dubois, MD    Allergies    Patient has no known allergies.  Review of Systems   Review of Systems  Unable to perform ROS: Dementia    Physical Exam Updated Vital Signs BP (!) 151/78   Pulse 83   Temp 98.2 F (36.8 C)  Resp (!) 24   Ht 5\' 11"  (1.803 m)   Wt 68 kg   SpO2 100%   BMI 20.92 kg/m   Physical Exam Vitals and nursing note reviewed.  Constitutional:      Appearance: He is well-developed.  HENT:     Head: Normocephalic and atraumatic.  Eyes:     Conjunctiva/sclera: Conjunctivae normal.  Cardiovascular:     Rate and Rhythm: Normal rate and regular rhythm.     Pulses: Normal pulses.     Heart sounds: Normal heart sounds.  Pulmonary:     Effort: Pulmonary effort is normal.     Breath sounds: Normal breath sounds. No wheezing.  Abdominal:     General: Bowel sounds are normal.     Palpations: Abdomen is soft.     Tenderness: There is no abdominal tenderness. There is no guarding.  Musculoskeletal:        General: Normal range of motion.     Cervical back: Normal range of motion.  Skin:    General: Skin is warm and dry.     Comments: Reduced skin turgor.  Dry skin.   Neurological:     Mental Status: He is alert.     ED Results / Procedures / Treatments   Labs (all labs ordered are listed, but only abnormal results are displayed) Labs Reviewed  SARS CORONAVIRUS 2 (TAT 6-24 HRS)  CBC WITH DIFFERENTIAL/PLATELET  COMPREHENSIVE METABOLIC PANEL  MAGNESIUM    EKG EKG Interpretation  Date/Time:  Tuesday August 28 2020  17:55:24 EDT Ventricular Rate:  96 PR Interval:    QRS Duration: 104 QT Interval:  365 QTC Calculation: 447 R Axis:   46 Text Interpretation: Sinus rhythm Since last tracing rate slower Otherwise no significant change Confirmed by Daleen Bo [phone]) on 08/28/2020 7:00:05 PM   Radiology No results found.  Procedures Procedures   Medications Ordered in ED Medications  magnesium sulfate IVPB 2 g 50 mL (has no administration in time range)    ED Course  I have reviewed the triage vital signs and the nursing notes.  Pertinent labs & imaging results that were available during my care of the patient were reviewed by me and considered in my medical decision making (see chart for details).    MDM Rules/Calculators/A&P                          Confirmatory labs pending, but labs from this morning draw at bedside confirming 0.6 mg/dL.  Pt will require admission for  IV magnesium which has been ordered.  Signed out to PA Caccavale who will call for admission once labs resulted. Final Clinical Impression(s) / ED Diagnoses Final diagnoses:  Hypomagnesemia    Rx / DC Orders ED Discharge Orders    None       Landis Martins 08/28/20 1901    Daleen Bo, MD 08/30/20 [phone]

## 2020-08-28 NOTE — ED Provider Notes (Signed)
  Physical Exam  BP (!) 151/78   Pulse 83   Temp 98.2 F (36.8 C)   Resp (!) 24   Ht 5\' 11"  (1.803 m)   Wt 68 kg   SpO2 100%   BMI 20.92 kg/m   Physical Exam  ED Course/Procedures     Procedures  MDM    Pt signed out to me by Olga Millers, PA.C. Please see previous notes for further history.  In brief, pt sent from [place] center due to hypomagnesemia. Pt with h/o dementia, but no reported sxs. H/o same, unknown cause. Per EMS, pt's Mg was 0.6.    In the ED, pt's mag is 0.7. given 2 g mag. Will call for admission.   Discussed with Dr. Denton Brick from [facility], pt to be admitted.         Franchot Heidelberg, PA-C 08/28/20 2044    Daleen Bo, MD 08/30/20 [phone]

## 2020-08-28 NOTE — ED Notes (Signed)
Date and time results received: 08/28/20 733 (use smartphrase ".now" to insert current time)  Test: Magneisum Critical Value: 0.7  Name of Provider Notified: Eulis Foster  Orders Received? Or Actions Taken?: NA

## 2020-08-29 ENCOUNTER — Encounter (HOSPITAL_COMMUNITY): Payer: Self-pay | Admitting: Internal Medicine

## 2020-08-29 DIAGNOSIS — Z7984 Long term (current) use of oral hypoglycemic drugs: Secondary | ICD-10-CM | POA: Diagnosis not present

## 2020-08-29 DIAGNOSIS — Z7982 Long term (current) use of aspirin: Secondary | ICD-10-CM | POA: Diagnosis not present

## 2020-08-29 DIAGNOSIS — R1310 Dysphagia, unspecified: Secondary | ICD-10-CM | POA: Diagnosis present

## 2020-08-29 DIAGNOSIS — J9611 Chronic respiratory failure with hypoxia: Secondary | ICD-10-CM | POA: Diagnosis present

## 2020-08-29 DIAGNOSIS — F0390 Unspecified dementia without behavioral disturbance: Secondary | ICD-10-CM | POA: Diagnosis present

## 2020-08-29 DIAGNOSIS — Z8711 Personal history of peptic ulcer disease: Secondary | ICD-10-CM | POA: Diagnosis not present

## 2020-08-29 DIAGNOSIS — Z20822 Contact with and (suspected) exposure to covid-19: Secondary | ICD-10-CM | POA: Diagnosis present

## 2020-08-29 DIAGNOSIS — N401 Enlarged prostate with lower urinary tract symptoms: Secondary | ICD-10-CM | POA: Diagnosis present

## 2020-08-29 DIAGNOSIS — E7800 Pure hypercholesterolemia, unspecified: Secondary | ICD-10-CM | POA: Diagnosis present

## 2020-08-29 DIAGNOSIS — I69391 Dysphagia following cerebral infarction: Secondary | ICD-10-CM | POA: Diagnosis not present

## 2020-08-29 DIAGNOSIS — D649 Anemia, unspecified: Secondary | ICD-10-CM | POA: Diagnosis present

## 2020-08-29 DIAGNOSIS — K259 Gastric ulcer, unspecified as acute or chronic, without hemorrhage or perforation: Secondary | ICD-10-CM | POA: Diagnosis present

## 2020-08-29 DIAGNOSIS — I10 Essential (primary) hypertension: Secondary | ICD-10-CM | POA: Diagnosis present

## 2020-08-29 DIAGNOSIS — R338 Other retention of urine: Secondary | ICD-10-CM | POA: Diagnosis present

## 2020-08-29 DIAGNOSIS — Z79899 Other long term (current) drug therapy: Secondary | ICD-10-CM | POA: Diagnosis not present

## 2020-08-29 DIAGNOSIS — K209 Esophagitis, unspecified without bleeding: Secondary | ICD-10-CM | POA: Diagnosis present

## 2020-08-29 DIAGNOSIS — Z794 Long term (current) use of insulin: Secondary | ICD-10-CM | POA: Diagnosis not present

## 2020-08-29 DIAGNOSIS — Z7401 Bed confinement status: Secondary | ICD-10-CM | POA: Diagnosis not present

## 2020-08-29 DIAGNOSIS — G300 Alzheimer's disease with early onset: Secondary | ICD-10-CM | POA: Diagnosis not present

## 2020-08-29 DIAGNOSIS — Z87891 Personal history of nicotine dependence: Secondary | ICD-10-CM | POA: Diagnosis not present

## 2020-08-29 DIAGNOSIS — E785 Hyperlipidemia, unspecified: Secondary | ICD-10-CM | POA: Diagnosis present

## 2020-08-29 DIAGNOSIS — R627 Adult failure to thrive: Secondary | ICD-10-CM | POA: Diagnosis present

## 2020-08-29 DIAGNOSIS — E1165 Type 2 diabetes mellitus with hyperglycemia: Secondary | ICD-10-CM | POA: Diagnosis present

## 2020-08-29 DIAGNOSIS — K3189 Other diseases of stomach and duodenum: Secondary | ICD-10-CM | POA: Diagnosis not present

## 2020-08-29 DIAGNOSIS — Z7902 Long term (current) use of antithrombotics/antiplatelets: Secondary | ICD-10-CM | POA: Diagnosis not present

## 2020-08-29 LAB — GLUCOSE, CAPILLARY
Glucose-Capillary: 138 mg/dL — ABNORMAL HIGH (ref 70–99)
Glucose-Capillary: 140 mg/dL — ABNORMAL HIGH (ref 70–99)
Glucose-Capillary: 149 mg/dL — ABNORMAL HIGH (ref 70–99)
Glucose-Capillary: 164 mg/dL — ABNORMAL HIGH (ref 70–99)
Glucose-Capillary: 196 mg/dL — ABNORMAL HIGH (ref 70–99)

## 2020-08-29 LAB — MAGNESIUM: Magnesium: 1.2 mg/dL — ABNORMAL LOW (ref 1.7–2.4)

## 2020-08-29 LAB — SARS CORONAVIRUS 2 (TAT 6-24 HRS): SARS Coronavirus 2: NEGATIVE

## 2020-08-29 LAB — CREATININE, URINE, RANDOM: Creatinine, Urine: 46.68 mg/dL

## 2020-08-29 MED ORDER — DONEPEZIL HCL 5 MG PO TABS
10.0000 mg | ORAL_TABLET | Freq: Every day | ORAL | Status: DC
  Administered 2020-08-29 – 2020-08-30 (×2): 10 mg via ORAL
  Filled 2020-08-29 (×2): qty 2

## 2020-08-29 MED ORDER — SUCRALFATE 1 G PO TABS
1.0000 g | ORAL_TABLET | Freq: Four times a day (QID) | ORAL | Status: DC
  Administered 2020-08-29 – 2020-08-30 (×7): 1 g via ORAL
  Filled 2020-08-29 (×7): qty 1

## 2020-08-29 MED ORDER — DRONABINOL 2.5 MG PO CAPS
2.5000 mg | ORAL_CAPSULE | Freq: Two times a day (BID) | ORAL | Status: DC
  Administered 2020-08-29 – 2020-08-30 (×4): 2.5 mg via ORAL
  Filled 2020-08-29 (×4): qty 1

## 2020-08-29 MED ORDER — POTASSIUM CHLORIDE IN NACL 40-0.9 MEQ/L-% IV SOLN: INTRAVENOUS | Status: AC

## 2020-08-29 MED ORDER — OXYBUTYNIN CHLORIDE ER 5 MG PO TB24
5.0000 mg | ORAL_TABLET | Freq: Every day | ORAL | Status: DC
  Administered 2020-08-29 – 2020-08-30 (×2): 5 mg via ORAL
  Filled 2020-08-29 (×2): qty 1

## 2020-08-29 MED ORDER — TAMSULOSIN HCL 0.4 MG PO CAPS
0.4000 mg | ORAL_CAPSULE | Freq: Every day | ORAL | Status: DC
  Administered 2020-08-29 – 2020-08-30 (×3): 0.4 mg via ORAL
  Filled 2020-08-29 (×3): qty 1

## 2020-08-29 MED ORDER — METOPROLOL TARTRATE 25 MG PO TABS
25.0000 mg | ORAL_TABLET | Freq: Two times a day (BID) | ORAL | Status: DC
  Administered 2020-08-29 – 2020-08-30 (×5): 25 mg via ORAL
  Filled 2020-08-29 (×5): qty 1

## 2020-08-29 MED ORDER — MAGNESIUM SULFATE 4 GM/100ML IV SOLN
4.0000 g | Freq: Once | INTRAVENOUS | Status: AC
  Administered 2020-08-29: 4 g via INTRAVENOUS
  Filled 2020-08-29: qty 100

## 2020-08-29 MED ORDER — INSULIN ASPART 100 UNIT/ML ~~LOC~~ SOLN
0.0000 [IU] | Freq: Three times a day (TID) | SUBCUTANEOUS | Status: DC
  Administered 2020-08-29: 3 [IU] via SUBCUTANEOUS
  Administered 2020-08-29 (×2): 2 [IU] via SUBCUTANEOUS
  Administered 2020-08-30: 5 [IU] via SUBCUTANEOUS
  Administered 2020-08-30: 2 [IU] via SUBCUTANEOUS
  Administered 2020-08-30: 3 [IU] via SUBCUTANEOUS

## 2020-08-29 MED ORDER — PANTOPRAZOLE SODIUM 40 MG PO TBEC
40.0000 mg | DELAYED_RELEASE_TABLET | Freq: Two times a day (BID) | ORAL | Status: DC
  Administered 2020-08-29 – 2020-08-30 (×5): 40 mg via ORAL
  Filled 2020-08-29 (×5): qty 1

## 2020-08-29 MED ORDER — MAGNESIUM SULFATE 2 GM/50ML IV SOLN: 2.0000 g | Freq: Once | INTRAVENOUS | Status: DC

## 2020-08-29 MED ORDER — ONDANSETRON HCL 4 MG/2ML IJ SOLN: 4.0000 mg | Freq: Four times a day (QID) | INTRAMUSCULAR | Status: DC | PRN

## 2020-08-29 MED ORDER — ENSURE ENLIVE PO LIQD
237.0000 mL | Freq: Two times a day (BID) | ORAL | Status: DC
  Administered 2020-08-29 – 2020-08-30 (×3): 237 mL via ORAL

## 2020-08-29 MED ORDER — TRAZODONE HCL 50 MG PO TABS
100.0000 mg | ORAL_TABLET | Freq: Every day | ORAL | Status: DC
  Administered 2020-08-29 – 2020-08-30 (×3): 100 mg via ORAL
  Filled 2020-08-29 (×3): qty 2

## 2020-08-29 MED ORDER — MEMANTINE HCL 10 MG PO TABS
10.0000 mg | ORAL_TABLET | Freq: Two times a day (BID) | ORAL | Status: DC
  Administered 2020-08-29 – 2020-08-30 (×5): 10 mg via ORAL
  Filled 2020-08-29 (×5): qty 1

## 2020-08-29 MED ORDER — ENOXAPARIN SODIUM 40 MG/0.4ML ~~LOC~~ SOLN
40.0000 mg | SUBCUTANEOUS | Status: DC
  Administered 2020-08-29 – 2020-08-30 (×2): 40 mg via SUBCUTANEOUS
  Filled 2020-08-29 (×2): qty 0.4

## 2020-08-29 MED ORDER — ACETAMINOPHEN 650 MG RE SUPP: 650.0000 mg | Freq: Four times a day (QID) | RECTAL | Status: DC | PRN

## 2020-08-29 MED ORDER — ONDANSETRON HCL 4 MG PO TABS: 4.0000 mg | ORAL_TABLET | Freq: Four times a day (QID) | ORAL | Status: DC | PRN

## 2020-08-29 MED ORDER — AMITRIPTYLINE HCL 10 MG PO TABS
10.0000 mg | ORAL_TABLET | Freq: Every day | ORAL | Status: DC
  Administered 2020-08-29 – 2020-08-30 (×3): 10 mg via ORAL
  Filled 2020-08-29 (×3): qty 1

## 2020-08-29 MED ORDER — ATORVASTATIN CALCIUM 40 MG PO TABS
40.0000 mg | ORAL_TABLET | Freq: Every day | ORAL | Status: DC
Start: 2020-08-29 — End: 2020-08-31
  Administered 2020-08-29 – 2020-08-30 (×2): 40 mg via ORAL
  Filled 2020-08-29 (×2): qty 1

## 2020-08-29 MED ORDER — MAGNESIUM OXIDE 400 (241.3 MG) MG PO TABS
800.0000 mg | ORAL_TABLET | Freq: Two times a day (BID) | ORAL | Status: DC
  Administered 2020-08-29 – 2020-08-30 (×4): 800 mg via ORAL
  Filled 2020-08-29 (×10): qty 2

## 2020-08-29 MED ORDER — ACETAMINOPHEN 325 MG PO TABS: 650.0000 mg | ORAL_TABLET | Freq: Four times a day (QID) | ORAL | Status: DC | PRN

## 2020-08-29 MED ORDER — INSULIN ASPART 100 UNIT/ML ~~LOC~~ SOLN: 0.0000 [IU] | Freq: Every day | SUBCUTANEOUS | Status: DC

## 2020-08-29 MED ORDER — AMILORIDE HCL 5 MG PO TABS
5.0000 mg | ORAL_TABLET | Freq: Every day | ORAL | Status: DC
  Administered 2020-08-29 – 2020-08-30 (×2): 5 mg via ORAL
  Filled 2020-08-29 (×3): qty 1

## 2020-08-29 MED ORDER — POLYETHYLENE GLYCOL 3350 17 G PO PACK: 17.0000 g | PACK | Freq: Every day | ORAL | Status: DC | PRN

## 2020-08-29 NOTE — Progress Notes (Signed)
Patient Demographics:    Daniel [name], is a 70 y.o. male, DOB - [birth year], TIR:[id number]  Admit date - 08/28/2020   Admitting Physician Bethena Roys, MD  Outpatient Primary MD for the patient is Leonie Douglas, MD  LOS - 0   Chief Complaint  Patient presents with  . Abnormal Lab    Mag        Subjective:    Daniel [name] today has no fevers, no emesis,  No chest pain,   No new complaints -poor historian -Denies pain No fevers no emesis  Assessment  & Plan :    Principal Problem:   Hypomagnesemia Active Problems:   Hypertension   Dementia (HCC)   Multiple gastric ulcers   Duodenal mass  Brief Summary  70 y.o. male with medical history significant for HTN, DM, dementia, CVA severe esophagitis, gastric ulcers and duodenal mass , as well as history of dysphagia and weakness secondary to prior CVA admitted on 08/29/2018 from [facility] in [place] with persistent hypomagnesemia  A/p   1)Persistent Hypomagnesemia--- review of records shows that patient magnesium was in the 1.1-1.4 range previously prior to initiation of PPI for esophagitis and peptic ulcer disease -After initiation of PPI patient magnesium appears to be less than 1 -On 08/07/2020 magnesium was 0.5, repeat magnesium this week at [facility] was 0.6 -Magnesium in the ED here at [facility] on 08/28/20 was  0.7 -PTA patient was on magnesium oxide 800 twice daily -Patient received 4 g of IV magnesium overnight as well as p.o. magnesium -Repeat magnesium today is 1.2, okay to give additional  4 g IV of magnesium and continue oral magnesium oxide -Potassium is low normal -No significant GI losses -Discussed with on-call nephrologist Dr. Royce Macadamia -She advised to get discharged today, -We will give amiloride 5 mg daily -Upon discharge we will increase magnesium oxide to 800 mg 3 times daily (so far patient appears not to have significant  diarrhea from mag oxide) -Patient will need weekly magnesium recheck post discharge -No current EtOH use, no diuretics and please note that patient's hypomagnesemia predates initiation of PPI  2)Severe Esophagitis and Peptic ulcer disease--EGD from 06/21/2020 noted -Patient with severe esophagitis, as well as gastric erosions and 4 gastric ulcers largest one being 1.5 x 2.5 cm--H. 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 Rush Landmark in [place]  4)Social/Ethics--patient is a full code, decision maker for patient is  Mrs. [name]   5)H/o Prior CVA--given history of recent GI bleed DAPT remains on hold pending repeat EGD -Continue Lipitor 40 mg daily for secondary stroke prophylaxis  6)HTN--started on amiloride mostly for hypomagnesemia rather than BP or diuretic effect -Continue metoprolol 25 mg twice daily  7)Chronic hypoxic respiratory failure--PT at baseline  8)BPH--- stable, continue Flomax  9)DM2-A1c was 9.8 in December 2021, reflecting uncontrolled DM with hyperglycemia -PTA patient was on 70/30 insulin and Metformin -Use Novolog/Humalog Sliding scale insulin with Accu-Cheks/Fingersticks as ordered   10)Dementia--stable, continue Namenda and Aricept  11) chronic normocytic and normochromic anemia--- hemoglobin stable at 9.9, monitor closely -  Anticipate  drop in H&H with hemodilution/IV fluid  12)FTT--- continue Marinol for appetite stimulation, continue nutritional supplements  13)BPH/urinary retention----continue Flomax and oxybutynin  Disposition/Need for in-Hospital Stay- patient unable to be discharged at this time due to --persistent, rather severe hypomagnesemia with risk for arrhythmias--- requires IV replacement*  Status  is: Inpatient  Remains inpatient appropriate because:Please see above   Disposition: The patient is from: home              Anticipated d/c is to: SNF              Anticipated d/c date is: 1 day              Patient currently is not medically stable to d/c. Barriers: Not Clinically Stable- persistent, rather severe hypomagnesemia with risk for arrhythmias--- requires IV replacement*  Code Status :  -  Code Status: Full Code   Family Communication:   None present  Consults  : Curbside consultation with nephrologist Dr. Royce Macadamia  DVT Prophylaxis  :   - SCDs  enoxaparin (LOVENOX) injection 40 mg Start: 08/29/20 0600    Lab Results  Component Value Date   PLT 209 08/28/2020    Inpatient Medications  Scheduled Meds: . amitriptyline  10 mg Oral QHS  . atorvastatin  40 mg Oral Daily  . donepezil  10 mg Oral Daily  . dronabinol  2.5 mg Oral BID AC  . enoxaparin (LOVENOX) injection  40 mg Subcutaneous Q24H  . feeding supplement  237 mL Oral BID BM  . insulin aspart  0-15 Units Subcutaneous TID WC  . insulin aspart  0-5 Units Subcutaneous QHS  . magnesium oxide  800 mg Oral BID  . memantine  10 mg Oral BID  . metoprolol tartrate  25 mg Oral BID  . oxybutynin  5 mg Oral Daily  . pantoprazole  40 mg Oral BID  . sucralfate  1 g Oral QID  . tamsulosin  0.4 mg Oral QHS  . traZODone  100 mg Oral QHS   Continuous Infusions: . 0.9 % NaCl with KCl 40 mEq / L 75 mL/hr at 08/29/20 0106  . magnesium sulfate bolus IVPB 4 g (08/29/20 1152)   PRN Meds:.acetaminophen **OR** acetaminophen, ondansetron **OR** ondansetron (ZOFRAN) IV, polyethylene glycol    Anti-infectives (From admission, onward)   None        Objective:   Vitals:   08/28/20 2213 08/29/20 0150 08/29/20 0613 08/29/20 1121  BP: (!) 164/97 138/87 126/72   Pulse: 82 88 76 80  Resp: 19 20 18    Temp: 98 F (36.7 C) 97.7 F (36.5 C) (!) 97.2 F (36.2 C)   TempSrc:  Oral Oral   SpO2: 97% 100% 100% 100%  Weight:       Height:        Wt Readings from Last 3 Encounters:  08/28/20 68 kg  07/24/20 68 kg  06/26/20 78 kg     Intake/Output Summary (Last 24 hours) at 08/29/2020 1221 Last data filed at 08/29/2020 0900 Gross per 24 hour  Intake 534.47 ml  Output 600 ml  Net -65.53 ml     Physical Exam  Gen:- Awake Alert,  In no apparent distress  HEENT:- [place].AT, No sclera icterus Neck-Supple Neck,No JVD,.  Lungs-  CTAB , fair symmetrical air movement CV- S1, S2 normal, regular  Abd-  +ve B.Sounds, Abd Soft, No tenderness,    Extremity/Skin:- No  edema, pedal pulses present  Psych-baseline cognitive or memory concerns  neuro-generalized weakness, no new focal deficits, no tremors   Data Review:   Micro Results Recent Results (from the past 240 hour(s))  SARS CORONAVIRUS 2 (TAT 6-24 HRS) Nasopharyngeal Nasopharyngeal Swab     Status: None   Collection Time: 08/28/20  5:56 PM   Specimen: Nasopharyngeal Swab  Result Value Ref Range Status   SARS Coronavirus 2 NEGATIVE NEGATIVE Final    Comment: (NOTE) SARS-CoV-2 target nucleic acids are NOT DETECTED.  The SARS-CoV-2 RNA is generally detectable in upper and lower respiratory specimens during the acute phase of infection. Negative results do not preclude SARS-CoV-2 infection, do not rule out co-infections with other pathogens, and should not be used as the sole basis for treatment or other patient management decisions. Negative results must be combined with clinical observations, patient history, and epidemiological information. The expected result is Negative.  Fact Sheet for Patients: SugarRoll.be  Fact Sheet for Healthcare Providers: https://www.woods-mathews.com/  This test is not yet approved or cleared by the [place] FDA and  has been authorized for detection and/or diagnosis of SARS-CoV-2 by FDA under an Emergency Use Authorization (EUA). This EUA will remain  in effect (meaning this  test can be used) for the duration of the COVID-19 declaration under Se ction 564(b)(1) of the Act, 21 U.S.C. section 360bbb-3(b)(1), unless the authorization is terminated or revoked sooner.  Performed at [facility], [place] [address], [place], [place] [address]     Radiology Reports CT PANCREAS ABD W/WO  Result Date: 08/25/2020 CLINICAL DATA:  70 year old male with history of pancreatic ductal dilatation noted on prior CT examination. Follow-up study. EXAM: CT ABDOMEN WITHOUT AND WITH CONTRAST TECHNIQUE: Multidetector CT imaging of the abdomen was performed following the standard protocol before and following the bolus administration of intravenous contrast. CONTRAST:  136mL OMNIPAQUE IOHEXOL 300 MG/ML  SOLN COMPARISON:  CT the abdomen and pelvis 06/19/2020. MRI of the abdomen with MRCP 06/25/2020. FINDINGS: Lower chest: Areas of subsegmental atelectasis or scarring are noted in the right lung base. Hepatobiliary: No suspicious cystic or solid hepatic lesions. No intra or extrahepatic biliary ductal dilatation. Gallbladder is normal in appearance. Pancreas: No pancreatic mass. No pancreatic ductal dilatation. No pancreatic or peripancreatic fluid collections or inflammatory changes. 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 Spleen: Unremarkable. 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 No surgical clips are noted in the left retroperitoneum. Multiple subcentimeter low-attenuation lesions in the right kidney, too small to definitively characterize, but likely to represent cysts based on their appearance on prior abdominal MRI. Small nonobstructive calculi are present within the right renal collecting system measuring up to 5 mm in the interpolar region. No suspicious right renal lesions. No right hydroureteronephrosis in the visualized portions of the abdomen. Bilateral adrenal glands are normal in  appearance. Stomach/Bowel: Normal appearance of the stomach. No pathologic dilatation of the visualized portions of small bowel or colon. Vascular/Lymphatic: No aneurysm identified in the visualized abdominal vasculature. No lymphadenopathy noted in the abdomen. Other: Small umbilical hernia containing only omental fat. No significant volume of ascites and no pneumoperitoneum noted in the visualized portions of the peritoneal cavity. Musculoskeletal: No aggressive appearing osseous lesions are noted in the visualized portions of the skeleton. Status post left hip arthroplasty. IMPRESSION: 1. No pancreatic ductal dilatation. 2. No acute findings in the abdomen. 3. Incidental findings, as above. Electronically Signed   By: Vinnie Langton M.D.   On: 08/25/2020 06:34  CBC Recent Labs  Lab 08/28/20 1854  WBC 5.9  HGB 9.9*  HCT 31.6*  PLT 209  MCV 88.8  MCH 27.8  MCHC 31.3  RDW 16.0*  LYMPHSABS 1.0  MONOABS 0.6  EOSABS 0.1  BASOSABS 0.0    Chemistries  Recent Labs  Lab 08/24/20 1120 08/28/20 1854 08/29/20 0618  NA  --  135  --   K  --  3.5  --   CL  --  96*  --   CO2  --  26  --   GLUCOSE  --  162*  --   BUN  --  16  --   CREATININE 0.70 0.90  --   CALCIUM  --  9.6  --   MG  --  0.7* 1.2*  AST  --  14*  --   ALT  --  17  --   ALKPHOS  --  97  --   BILITOT  --  0.2*  --    ------------------------------------------------------------------------------------------------------------------ No results for input(s): CHOL, HDL, LDLCALC, TRIG, CHOLHDL, LDLDIRECT in the last 72 hours.  Lab Results  Component Value Date   HGBA1C 9.8 (H) 06/06/2020   ------------------------------------------------------------------------------------------------------------------ No results for input(s): TSH, T4TOTAL, T3FREE, THYROIDAB in the last 72 hours.  Invalid input(s):  FREET3 ------------------------------------------------------------------------------------------------------------------ No results for input(s): VITAMINB12, FOLATE, FERRITIN, TIBC, IRON, RETICCTPCT in the last 72 hours.  Coagulation profile No results for input(s): INR, PROTIME in the last 168 hours.  No results for input(s): DDIMER in the last 72 hours.  Cardiac Enzymes No results for input(s): CKMB, TROPONINI, MYOGLOBIN in the last 168 hours.  Invalid input(s): CK ------------------------------------------------------------------------------------------------------------------ No results found for: BNP   Roxan Hockey M.D on 08/29/2020 at 12:21 PM  Go to [email or web address] - for contact info  [facility] - Office  [phone]

## 2020-08-30 ENCOUNTER — Ambulatory Visit (HOSPITAL_COMMUNITY): Payer: Medicare Other | Admitting: Hematology

## 2020-08-30 DIAGNOSIS — G300 Alzheimer's disease with early onset: Secondary | ICD-10-CM

## 2020-08-30 DIAGNOSIS — K209 Esophagitis, unspecified without bleeding: Secondary | ICD-10-CM

## 2020-08-30 DIAGNOSIS — K3189 Other diseases of stomach and duodenum: Secondary | ICD-10-CM | POA: Diagnosis not present

## 2020-08-30 DIAGNOSIS — K259 Gastric ulcer, unspecified as acute or chronic, without hemorrhage or perforation: Secondary | ICD-10-CM | POA: Diagnosis not present

## 2020-08-30 DIAGNOSIS — F0280 Dementia in other diseases classified elsewhere without behavioral disturbance: Secondary | ICD-10-CM

## 2020-08-30 LAB — RENAL FUNCTION PANEL
Albumin: 2.3 g/dL — ABNORMAL LOW (ref 3.5–5.0)
Anion gap: 10 (ref 5–15)
BUN: 14 mg/dL (ref 8–23)
CO2: 28 mmol/L (ref 22–32)
Calcium: 10.3 mg/dL (ref 8.9–10.3)
Chloride: 97 mmol/L — ABNORMAL LOW (ref 98–111)
Creatinine, Ser: 0.89 mg/dL (ref 0.61–1.24)
GFR, Estimated: >60 mL/min (ref >60)
Glucose, Bld: 144 mg/dL — ABNORMAL HIGH (ref 70–99)
Phosphorus: 2.8 mg/dL (ref 2.5–4.6)
Potassium: 3.9 mmol/L (ref 3.5–5.1)
Sodium: 135 mmol/L (ref 135–145)

## 2020-08-30 LAB — GLUCOSE, CAPILLARY
Glucose-Capillary: 141 mg/dL — ABNORMAL HIGH (ref 70–99)
Glucose-Capillary: 222 mg/dL — ABNORMAL HIGH (ref 70–99)
Glucose-Capillary: 166 mg/dL — ABNORMAL HIGH (ref 70–99)
Glucose-Capillary: 127 mg/dL — ABNORMAL HIGH (ref 70–99)

## 2020-08-30 LAB — MAGNESIUM: Magnesium: 1.2 mg/dL — ABNORMAL LOW (ref 1.7–2.4)

## 2020-08-30 LAB — MAGNESIUM, URINE: Magnesium, urine: 14.4 mg/dL

## 2020-08-30 MED ORDER — INSULIN ASPART 100 UNIT/ML FLEXPEN: 0.0000 [IU] | PEN_INJECTOR | Freq: Three times a day (TID) | SUBCUTANEOUS | 11 refills | Status: DC

## 2020-08-30 MED ORDER — ATORVASTATIN CALCIUM 40 MG PO TABS: 40.0000 mg | ORAL_TABLET | Freq: Every day | ORAL | 4 refills | Status: DC

## 2020-08-30 MED ORDER — ACETAMINOPHEN 325 MG PO TABS: 650.0000 mg | ORAL_TABLET | Freq: Four times a day (QID) | ORAL | 0 refills | Status: DC | PRN

## 2020-08-30 MED ORDER — METOPROLOL TARTRATE 25 MG PO TABS: 25.0000 mg | ORAL_TABLET | Freq: Two times a day (BID) | ORAL | 3 refills | Status: AC

## 2020-08-30 MED ORDER — AMILORIDE HCL 5 MG PO TABS: 5.0000 mg | ORAL_TABLET | Freq: Every day | ORAL | 5 refills | Status: DC

## 2020-08-30 MED ORDER — PANTOPRAZOLE SODIUM 40 MG PO TBEC: 40.0000 mg | DELAYED_RELEASE_TABLET | Freq: Every day | ORAL | 3 refills | Status: DC

## 2020-08-30 MED ORDER — MAGNESIUM OXIDE 400 MG PO TABS: 800.0000 mg | ORAL_TABLET | Freq: Three times a day (TID) | ORAL | 5 refills | Status: DC

## 2020-08-30 MED ORDER — MAGNESIUM SULFATE 4 GM/100ML IV SOLN
4.0000 g | Freq: Once | INTRAVENOUS | Status: AC
  Administered 2020-08-30: 4 g via INTRAVENOUS
  Filled 2020-08-30: qty 100

## 2020-08-30 NOTE — NC FL2 (Signed)
[place] LEVEL OF CARE SCREENING TOOL     IDENTIFICATION  Patient Name: Daniel [name] Birthdate: 1951-03-30 Sex: male Admission Date (Current Location): 08/28/2020  [place] and [place] Number:  [place] [phone] [place] and Address:  [place] [address], [place]      Provider Number: [id number]  Attending Physician Name and Address:  Roxan Hockey, MD  Relative Name and Phone Number:  [name] (Daisy)   [phone]    Current Level of Care: Hospital Recommended Level of Care: [place] Prior Approval Number:    Date Approved/Denied:   PASRR Number:    Discharge Plan: SNF    Current Diagnoses: Patient Active Problem List   Diagnosis Date Noted  . Multiple gastric ulcers 07/24/2020  . Esophagitis 07/24/2020  . 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 Abdominal pain 06/20/2020  . Normochromic normocytic anemia   . Upper abdominal pain   . Abnormal CT scan, esophagus   . Abnormal CT scan, stomach   . Pancreatic abnormality   . Constipation   . Hyperglycemia   . History of stroke   . Hypokalemia   . Hypomagnesemia   . AKI (acute kidney injury) ([place]) 06/06/2020  . Acute CVA (cerebrovascular accident) ([place]) 01/05/2020  . Right knee pain 01/01/2017  . Osteoarthritis of right knee 01/01/2017  . Mental impairment 09/23/2016  . Hypertension 09/23/2016  . Type 2 diabetes mellitus without complication, without long-term current use of insulin ([place]) 09/23/2016  . BPH (benign prostatic hyperplasia) 09/23/2016  . Dementia ([place]) 09/23/2016  . 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    Orientation RESPIRATION BLADDER Height & Weight     Self,Place  Normal Incontinent Weight: 150 lb (68 kg) Height:  5\' 11"  (180.3 cm)  BEHAVIORAL SYMPTOMS/MOOD NEUROLOGICAL BOWEL NUTRITION STATUS      Incontinent Diet (heart healthy/carb modified)  AMBULATORY STATUS COMMUNICATION OF NEEDS Skin   Limited Assist  Verbally PU Stage and Appropriate Care   PU Stage 2 Dressing:  (Stage 2 buttocks, left)                   Personal Care Assistance Level of Assistance  Bathing,Feeding,Dressing Bathing Assistance: Limited assistance Feeding assistance: Independent Dressing Assistance: Limited assistance     Functional Limitations Info             SPECIAL CARE FACTORS FREQUENCY                       Contractures Contractures Info: Not present    Additional Factors Info  Code Status,Allergies Code Status Info: Full Code Allergies Info: NKA           Current Medications (08/30/2020):  This is the current hospital active medication list Current Facility-Administered Medications  Medication Dose Route Frequency Provider Last Rate Last Admin  . acetaminophen (TYLENOL) tablet 650 mg  650 mg Oral Q6H PRN Emokpae, Ejiroghene E, MD       Or  . acetaminophen (TYLENOL) suppository 650 mg  650 mg Rectal Q6H PRN Emokpae, Ejiroghene E, MD      . aMILoride (MIDAMOR) tablet 5 mg  5 mg Oral Daily Emokpae, Courage, MD   5 mg at 08/30/20 0949  . amitriptyline (ELAVIL) tablet 10 mg  10 mg Oral QHS Emokpae, Ejiroghene E, MD   10 mg at 08/29/20 2159  . atorvastatin (LIPITOR) tablet 40 mg  40 mg Oral Daily Emokpae, Ejiroghene E, MD   40 mg  at 08/30/20 0811  . donepezil (ARICEPT) tablet 10 mg  10 mg Oral Daily Emokpae, Ejiroghene E, MD   10 mg at 08/30/20 0809  . dronabinol (MARINOL) capsule 2.5 mg  2.5 mg Oral BID AC Emokpae, Ejiroghene E, MD   2.5 mg at 08/30/20 1305  . enoxaparin (LOVENOX) injection 40 mg  40 mg Subcutaneous Q24H Emokpae, Ejiroghene E, MD   40 mg at 08/30/20 0629  . feeding supplement (ENSURE ENLIVE / ENSURE PLUS) liquid 237 mL  237 mL Oral BID BM Emokpae, Ejiroghene E, MD   237 mL at 08/30/20 0952  . insulin aspart (novoLOG) injection 0-15 Units  0-15 Units Subcutaneous TID WC Emokpae, Ejiroghene E, MD   5 Units at 08/30/20 1305  . insulin aspart (novoLOG) injection 0-5 Units  0-5  Units Subcutaneous QHS Emokpae, Ejiroghene E, MD      . magnesium oxide (MAG-OX) tablet 800 mg  800 mg Oral BID Emokpae, Ejiroghene E, MD   800 mg at 08/30/20 0811  . memantine (NAMENDA) tablet 10 mg  10 mg Oral BID Emokpae, Ejiroghene E, MD   10 mg at 08/30/20 0809  . metoprolol tartrate (LOPRESSOR) tablet 25 mg  25 mg Oral BID Emokpae, Ejiroghene E, MD   25 mg at 08/30/20 0810  . ondansetron (ZOFRAN) tablet 4 mg  4 mg Oral Q6H PRN Emokpae, Ejiroghene E, MD       Or  . ondansetron (ZOFRAN) injection 4 mg  4 mg Intravenous Q6H PRN Emokpae, Ejiroghene E, MD      . oxybutynin (DITROPAN-XL) 24 hr tablet 5 mg  5 mg Oral Daily Emokpae, Ejiroghene E, MD   5 mg at 08/30/20 0811  . pantoprazole (PROTONIX) EC tablet 40 mg  40 mg Oral BID Emokpae, Ejiroghene E, MD   40 mg at 08/30/20 0810  . polyethylene glycol (MIRALAX / GLYCOLAX) packet 17 g  17 g Oral Daily PRN Emokpae, Ejiroghene E, MD      . sucralfate (CARAFATE) tablet 1 g  1 g Oral QID Emokpae, Ejiroghene E, MD   1 g at 08/30/20 0810  . tamsulosin (FLOMAX) capsule 0.4 mg  0.4 mg Oral QHS Emokpae, Ejiroghene E, MD   0.4 mg at 08/29/20 2201  . traZODone (DESYREL) tablet 100 mg  100 mg Oral QHS Emokpae, Ejiroghene E, MD   100 mg at 08/29/20 2159     Discharge Medications: Please see discharge summary for a list of discharge medications.  Relevant Imaging Results:  Relevant Lab Results:   Additional Information Pt SSN: [id number]  Ihor Gully, LCSW

## 2020-08-30 NOTE — Discharge Summary (Signed)
Daniel Daniel [name], is a 70 y.o. Daniel [name]  DOB [birth year]  MRN [id number].  Admission date:  08/28/2020  Admitting Physician  Daniel Hockey, MD  Discharge Date:  08/30/2020   Primary MD  Daniel Douglas, MD  Recommendations for primary care physician for things to follow:   1)Weekly CBC, BMP and Serum Magnesium Blood test every Monday starting September 03, 2020 2)Avoid ibuprofen/Advil/Aleve/Motrin/Goody Powders/Naproxen/BC powders/Meloxicam/Diclofenac/Indomethacin and other Nonsteroidal anti-inflammatory medications as these will make you more likely to bleed and can cause stomach ulcers, can also cause Kidney problems.  3)Follow-up with GI physician Dr. Rush Daniel [name] as previously planned for endoscopy and biopsies 4)Magnesium Oxide has been changed to 800 mg 3 times daily 5)-Sliding scale insulin coverage as follows --insulin aspart (novoLOG) injection 0-10 Units 0-10 Units Subcutaneous, 3 times daily with meals CBG < 70: Implement Hypoglycemia Standing Orders and refer to Hypoglycemia Standing Orders sidebar report  CBG 70 - 120: 0 unit CBG 121 - 150: 0 unit  CBG 151 - 200: 1 unit CBG 201 - 250: 2 units CBG 251 - 300: 4 units CBG 301 - 350: 6 units  CBG 351 - 400: 8 units  CBG > 400: 10 units  Admission Diagnosis  Hypomagnesemia [E83.42]   Discharge Diagnosis  Hypomagnesemia [E83.42]    Principal Problem:   Hypomagnesemia Active Problems:   Hypertension   Dementia (HCC)   Multiple gastric ulcers   Duodenal mass     Past Medical History:  Diagnosis Date  . Diabetes mellitus without complication ([place])   . High cholesterol   . Hypertension     Past Surgical History:  Procedure Laterality Date  . BIOPSY  06/21/2020   Procedure: BIOPSY;  Surgeon: Daniel Dolin, MD;  Location: [facility];  Service: Endoscopy;;  gastric  . ESOPHAGOGASTRODUODENOSCOPY (EGD) WITH PROPOFOL N/A 06/21/2020   Procedure:  ESOPHAGOGASTRODUODENOSCOPY (EGD) WITH PROPOFOL;  Surgeon: Daniel Dolin, MD;  Location: [facility];  Service: Endoscopy;  Laterality: N/A;  . HIP SURGERY     left  . KIDNEY SURGERY       HPI  from the history and physical done on the day of admission:     Chief Complaint: Abnormal Labs  HPI: Daniel Daniel [name] is a 70 y.o. Daniel [name] with medical history significant for HTN, DM, dementia, esophagitis, gastric ulcers and duodenal mass.  History is limited from patient due to dementia and speech difficult to understand. Patient is able to answer simple questions, tell me his name and ask how long he will be in the hospital.  He denies vomiting, no loose stools.  He denies pain.  Recent hospitalization 1/11 - 1 18 -for abdominal pain, EGD showed severe esophagitis, 4 gastric ulcers with erosions, largest ulcer 2.5 cm, biopsies were negative for H. pylori or dysplasia.  Also duodenal mass seen, not biopsied as patient was on Plavix.  Incidental finding of dilated pancreatic duct, MRCP limited by motion, to follow-up as outpatient.  Patient was started on twice daily PPI and Carafate.  ED  Course: Stable vitals.  Magnesium 0.7, potassium 3.5.  Calcium 9.6. Supplementation started with 2 g of magnesium sulfate.  Hospitalist to admit.  Review of Systems: As per HPI all other systems reviewed and negative.    Hospital Course:    Brief Summary 70 y.o.malewith medical history significant forHTN, DM,dementia, CVA severe esophagitis, gastric ulcers and duodenal mass , as well as history of dysphagia and weakness secondary to prior CVA admitted on 08/29/2018 from [facility] in [place] with persistent hypomagnesemia  A/p  1)Persistent Hypomagnesemia--- review of records shows that patient magnesium was in the 1.1-1.4 range previously prior to initiation of PPI for esophagitis and peptic ulcer disease -After initiation of PPI patient magnesium appears to be less than 1 -On 08/07/2020 magnesium was 0.5,  repeat magnesium this week at [facility] was 0.6 -Magnesium in the ED here at [facility] on 08/28/20 was  0.7 -PTA patient was on magnesium oxide 800 twice daily -Patient received 8 g of IV magnesium this admission and also received  p.o. magnesium -Repeat magnesium today is 1.2, okay to give additional  4 g IV of magnesium for a total of 12 gm of iv Magnesium this admission --Increase oral magnesium oxide to 800 mg tid -Potassium is not low  -No significant GI losses (no emesis and no diarrhea) -Discussed with on-call nephrologist Dr. Royce Daniel [name] -discharge to SNF on Amiloride 5 mg daily -so far patient appears not to have significant diarrhea from mag oxide -Patient will need weekly magnesium recheck every Monday post discharge -No current EtOH use, no diuretics and please note that patient's hypomagnesemia predates initiation of PPI Protonix (PPI) is contributing to his persistent Hypomagnesemia--- Will decrease Protonix to 40 mg daily (from bid)   2)Severe Esophagitis and Peptic Ulcer Disease--EGD from 06/21/2020 noted -Patient with severe esophagitis, as well as gastric erosions and 4 gastric ulcers largest one being 1.5 x 2.5 cm--H. 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  Daniel Daniel [name] recommends tissue diagnosis/biopsy in order to guide prognosis and to Help formulate treatment plan --Awaiting EGD with resection/biopsy with Dr. Rush Daniel [name] in [place]  4)Social/Ethics--patient is a full code, decision maker for patient is  Mrs. Daniel Daniel [name]   5)H/o Prior CVA--given history of recent GI bleed DAPT remains on hold pending repeat EGD -Continue Lipitor 40 mg daily for secondary stroke prophylaxis  6)HTN--started  on amiloride mostly for hypomagnesemia rather than BP or diuretic effect -Continue metoprolol 25 mg twice daily  7)Chronic hypoxic respiratory failure-- uses oxygen via [place] at 2L/min at baseline  8)BPH--- stable, continue Flomax  9)DM2-A1c was 9.8 in December 2021, reflecting uncontrolled DM with hyperglycemia C/n Metformin -Use Novolog/Humalog Sliding scale insulin with Accu-Cheks/Fingersticks as ordered   10)Dementia-----Overall patient is a poor historian due to dementia and underlying cognitive and memory deficits -- continue Namenda and Aricept  11)Chronic normocytic and normochromic anemia--- hemoglobin stable at 9.9, monitor closely Check CBC weekly every Monday   12)FTT--- continue Marinol for appetite stimulation, continue nutritional supplements  13)BPH/urinary retention----continue Flomax and oxybutynin  Disposition--SNF   Disposition: The patient is from: SNF  Anticipated d/c is to: SNF   Code Status :  -  Code Status: Full Code   Family Communication:   None present  Consults  : Curbside consultation  with nephrologist Dr. Royce Daniel [name]  Discharge Condition: stable  Follow UP   Contact information for follow-up providers    Daniel Douglas, MD. Schedule an appointment as soon as possible for a visit in 1 week(s).   Specialties: Family Medicine, Sports Medicine Why: Repeat CBC, BMP and Serum Magnesium Contact information: [address] [place] [address] [phone]            Contact information for after-discharge care    [facility] .   Service: Skilled Nursing Contact information: [address] [place] [address] [phone]                 Diet and Activity recommendation:  As advised  Discharge Instructions    Discharge Instructions    Call MD for:  difficulty breathing, headache or visual disturbances   Complete by: As directed    Call  MD for:  persistant dizziness or light-headedness   Complete by: As directed    Call MD for:  persistant nausea and vomiting   Complete by: As directed    Call MD for:  temperature >100.4   Complete by: As directed    Diet - low sodium heart healthy   Complete by: As directed    Diet Carb Modified   Complete by: As directed    Discharge instructions   Complete by: As directed    1) weekly CBC, BMP and Serum Magnesium Blood test every Monday starting September 03, 2020 2)Avoid ibuprofen/Advil/Aleve/Motrin/Goody Powders/Naproxen/BC powders/Meloxicam/Diclofenac/Indomethacin and other Nonsteroidal anti-inflammatory medications as these will make you more likely to bleed and can cause stomach ulcers, can also cause Kidney problems.  3)Follow-up with GI physician Dr. Rush Daniel [name] as previously planned for endoscopy and biopsies 4)Magnesium Oxide has been changed to 800 mg 3 times daily 5)-sliding scale insulin coverage as follows --insulin aspart (novoLOG) injection 0-10 Units 0-10 Units Subcutaneous, 3 times daily with meals CBG < 70: Implement Hypoglycemia Standing Orders and refer to Hypoglycemia Standing Orders sidebar report  CBG 70 - 120: 0 unit CBG 121 - 150: 0 unit  CBG 151 - 200: 1 unit CBG 201 - 250: 2 units CBG 251 - 300: 4 units CBG 301 - 350: 6 units  CBG 351 - 400: 8 units  CBG > 400: 10 units   Increase activity slowly   Complete by: As directed        Discharge Medications     Allergies as of 08/30/2020   No Known Allergies     Medication List    STOP taking these medications   acetaminophen 650 MG CR tablet Commonly known as: TYLENOL Replaced by: acetaminophen 325 MG tablet   Aspirin Low Dose 81 MG EC tablet Generic drug: aspirin   clopidogrel 75 MG tablet Commonly known as: PLAVIX   NovoLIN 70/30 (70-30) 100 UNIT/ML injection Generic drug: insulin NPH-regular Human   potassium chloride 10 MEQ tablet Commonly known as: KLOR-CON     TAKE these medications    acetaminophen 325 MG tablet Commonly known as: TYLENOL Take 2 tablets (650 mg total) by mouth every 6 (six) hours as needed for mild pain, fever or headache (or Fever >/= 101). Replaces: acetaminophen 650 MG CR tablet   aMILoride 5 MG tablet Commonly known as: MIDAMOR Take 1 tablet (5 mg total) by mouth daily. Start taking on: August 31, 2020   amitriptyline 10 MG tablet Commonly known as: ELAVIL Take 10 mg by mouth at  bedtime.   atorvastatin 40 MG tablet Commonly known as: LIPITOR Take 1 tablet (40 mg total) by mouth daily.   donepezil 10 MG tablet Commonly known as: ARICEPT TAKE (1) TABLET BY MOUTH ONCE DAILY. What changed: See the new instructions.   dronabinol 2.5 MG capsule Commonly known as: MARINOL Take 2.5 mg by mouth 2 (two) times daily before lunch and supper.   feeding supplement Liqd Take 237 mLs by mouth 2 (two) times daily between meals.   FeroSul 325 (65 FE) MG tablet Generic drug: ferrous sulfate TAKE 1 TABLET BY MOUTH TWICE DAILY. What changed:   how much to take  when to take this   folic acid 191 MCG tablet Commonly known as: FOLVITE Take 800 mcg by mouth daily.   gentian violet 1 % topical solution Apply 1 mL topically See admin instructions. Applied between toes daily   insulin aspart 100 UNIT/ML FlexPen Commonly known as: NOVOLOG Inject 0-10 Units into the skin 3 (three) times daily with meals. insulin aspart (novoLOG) injection 0-10 Units 0-10 Units Subcutaneous, 3 times daily with meals CBG < 70: Implement Hypoglycemia Standing Orders and refer to Hypoglycemia Standing Orders sidebar report  CBG 70 - 120: 0 unit CBG 121 - 150: 0 unit  CBG 151 - 200: 1 unit CBG 201 - 250: 2 units CBG 251 - 300: 4 units CBG 301 - 350: 6 units  CBG 351 - 400: 8 units  CBG > 400: 10 units   magnesium oxide 400 MG tablet Commonly known as: MAG-OX Take 2 tablets (800 mg total) by mouth 3 (three) times daily. What changed: when to take this   memantine 10 MG  tablet Commonly known as: NAMENDA Take 10 mg by mouth 2 (two) times daily.   metFORMIN 1000 MG tablet Commonly known as: GLUCOPHAGE Take 1 tablet (1,000 mg total) by mouth 2 (two) times daily with a meal.   metoprolol tartrate 25 MG tablet Commonly known as: LOPRESSOR Take 1 tablet (25 mg total) by mouth 2 (two) times daily.   ondansetron 4 MG tablet Commonly known as: ZOFRAN Take 1 tablet (4 mg total) by mouth every 6 (six) hours as needed for nausea.   oxybutynin 5 MG 24 hr tablet Commonly known as: DITROPAN-XL Take 5 mg by mouth daily.   OXYGEN Inhale 2 L into the lungs daily.   Ozempic (0.25 or 0.5 MG/DOSE) 2 MG/1.5ML Sopn Generic drug: Semaglutide(0.25 or 0.5MG /DOS) Inject 0.5 mg into the skin once a week.   pantoprazole 40 MG tablet Commonly known as: Protonix Take 1 tablet (40 mg total) by mouth daily. What changed: when to take this   sucralfate 1 g tablet Commonly known as: Carafate Take 1 tablet (1 g total) by mouth 4 (four) times daily.   tamsulosin 0.4 MG Caps capsule Commonly known as: FLOMAX TAKE 1 CAPSULE BY MOUTH AT BEDTIME.   traZODone 100 MG tablet Commonly known as: DESYREL Take 100 mg by mouth at bedtime.   vitamin B-12 500 MCG tablet Commonly known as: CYANOCOBALAMIN Take 1,000 mcg by mouth daily.   Vitamin D3 50 MCG (2000 UT) capsule TAKE 1 CAPSULE BY MOUTH ONCE DAILY. **DO NOT CRUSH** What changed: See the new instructions.      Major procedures and Radiology Reports - PLEASE review detailed and final reports for all details, in brief -   CT PANCREAS ABD W/WO  Result Date: 08/25/2020 CLINICAL DATA:  70 year old Daniel [name] with history of pancreatic ductal dilatation noted on prior CT  examination. Follow-up study. EXAM: CT ABDOMEN WITHOUT AND WITH CONTRAST TECHNIQUE: Multidetector CT imaging of the abdomen was performed following the standard protocol before and following the bolus administration of intravenous contrast. CONTRAST:  188mL  OMNIPAQUE IOHEXOL 300 MG/ML  SOLN COMPARISON:  CT the abdomen and pelvis 06/19/2020. MRI of the abdomen with MRCP 06/25/2020. FINDINGS: Lower chest: Areas of subsegmental atelectasis or scarring are noted in the right lung base. Hepatobiliary: No suspicious cystic or solid hepatic lesions. No intra or extrahepatic biliary ductal dilatation. Gallbladder is normal in appearance. Pancreas: No pancreatic mass. No pancreatic ductal dilatation. No pancreatic or peripancreatic fluid collections or inflammatory changes. 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 Spleen: Unremarkable. 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 No surgical clips are noted in the left retroperitoneum. Multiple subcentimeter low-attenuation lesions in the right kidney, too small to definitively characterize, but likely to represent cysts based on their appearance on prior abdominal MRI. Small nonobstructive calculi are present within the right renal collecting system measuring up to 5 mm in the interpolar region. No suspicious right renal lesions. No right hydroureteronephrosis in the visualized portions of the abdomen. Bilateral adrenal glands are normal in appearance. Stomach/Bowel: Normal appearance of the stomach. No pathologic dilatation of the visualized portions of small bowel or colon. Vascular/Lymphatic: No aneurysm identified in the visualized abdominal vasculature. No lymphadenopathy noted in the abdomen. Other: Small umbilical hernia containing only omental fat. No significant volume of ascites and no pneumoperitoneum noted in the visualized portions of the peritoneal cavity. Musculoskeletal: No aggressive appearing osseous lesions are noted in the visualized portions of the skeleton. Status post left hip arthroplasty. IMPRESSION: 1. No pancreatic ductal dilatation. 2. No acute findings in the abdomen. 3. Incidental findings, as above.  Electronically Signed   By: Vinnie Langton M.D.   On: 08/25/2020 06:34   Micro Results   Recent Results (from the past 240 hour(s))  SARS CORONAVIRUS 2 (TAT 6-24 HRS) Nasopharyngeal Nasopharyngeal Swab     Status: None   Collection Time: 08/28/20  5:56 PM   Specimen: Nasopharyngeal Swab  Result Value Ref Range Status   SARS Coronavirus 2 NEGATIVE NEGATIVE Final    Comment: (NOTE) SARS-CoV-2 target nucleic acids are NOT DETECTED.  The SARS-CoV-2 RNA is generally detectable in upper and lower respiratory specimens during the acute phase of infection. Negative results do not preclude SARS-CoV-2 infection, do not rule out co-infections with other pathogens, and should not be used as the sole basis for treatment or other patient management decisions. Negative results must be combined with clinical observations, patient history, and epidemiological information. The expected result is Negative.  Fact Sheet for Patients: SugarRoll.be  Fact Sheet for Healthcare Providers: https://www.woods-mathews.com/  This test is not yet approved or cleared by the [place] FDA and  has been authorized for detection and/or diagnosis of SARS-CoV-2 by FDA under an Emergency Use Authorization (EUA). This EUA will remain  in effect (meaning this test can be used) for the duration of the COVID-19 declaration under Se ction 564(b)(1) of the Act, 21 U.S.C. section 360bbb-3(b)(1), unless the authorization is terminated or revoked sooner.  Performed at [facility], [place] [address] [place], [place] [address]    Today   Subjective    Daniel Daniel [name] today has no new concerns No fever  Or chills  No chest pain, no dizziness     --Overall patient is a poor historian due to dementia  and underlying cognitive and memory deficit   Patient has been seen and examined prior to discharge   Objective   Blood pressure 122/64, pulse 82, temperature 98.4 F  (36.9 C), temperature source Oral, resp. rate 17, height 5\' 11"  (1.803 m), weight 68 kg, SpO2 98 %.   Intake/Output Summary (Last 24 hours) at 08/30/2020 1440 Last data filed at 08/30/2020 1300 Gross per 24 hour  Intake 610 ml  Output 2800 ml  Net -2190 ml   Exam Gen:- Awake Alert, no acute distress HEENT:- [place].AT, No sclera icterus Nose- [place] 2L/min Neck-Supple Neck,No JVD,.  Lungs-  CTAB , good air movement bilaterally  CV- S1, S2 normal, regular Abd-  +ve B.Sounds, Abd Soft, No tenderness,   Extremity/Skin:- No  edema,   good pulses Psych--underlying cognitive and memory deficits Neuro-generalized weakness , no new focal deficits, no tremors    Data Review   CBC w Diff:  Lab Results  Component Value Date   WBC 5.9 08/28/2020   HGB 9.9 (L) 08/28/2020   HCT 31.6 (L) 08/28/2020   PLT 209 08/28/2020   LYMPHOPCT 16 08/28/2020   MONOPCT 9 08/28/2020   EOSPCT 2 08/28/2020   BASOPCT 0 08/28/2020    CMP:  Lab Results  Component Value Date   NA 135 08/30/2020   K 3.9 08/30/2020   CL 97 (L) 08/30/2020   CO2 28 08/30/2020   BUN 14 08/30/2020   CREATININE 0.89 08/30/2020   CREATININE 1.29 (H) 12/31/2016   PROT 5.7 (L) 08/28/2020   ALBUMIN 2.3 (L) 08/30/2020   BILITOT 0.2 (L) 08/28/2020   ALKPHOS 97 08/28/2020   AST 14 (L) 08/28/2020   ALT 17 08/28/2020  . Total Discharge time is about 33 minutes  Daniel Daniel [name] M.D on 08/30/2020 at 2:40 PM  Go to [email or web address] -  for contact info  [facility] - Office  [phone]

## 2020-08-30 NOTE — TOC Transition Note (Signed)
Transition of Care [facility]) - CM/SW Discharge Note   Patient Details  Name: Daniel [name] MRN: [id number] Date of Birth: 02-Jun-1951  Transition of Care [facility]) CM/SW Contact:  Ihor Gully, LCSW Phone Number: 08/30/2020, 4:22 PM   Clinical Narrative:    Discharge clinicals sent to facility. Legal guardian, [name], notified. Ebony Hail at [facility] agreeable to d/c.    Final next level of care: Skilled Nursing Facility Barriers to Discharge: No Barriers Identified   Patient Goals and CMS Choice        Discharge Placement                  Name of family member notified: [name], RCDSS Patient and family notified of of transfer: 08/30/20  Discharge Plan and Services                                     Social Determinants of Health (SDOH) Interventions     Readmission Risk Interventions Readmission Risk Prevention Plan 06/21/2020 06/08/2020  Transportation Screening Complete Complete  PCP or Specialist Appt within 5-7 Days - Complete  Home Care Screening - Complete  Medication Review (RN CM) - Complete  HRI or Home Care Consult Complete -  Social Work Consult for [place] Planning/Counseling Complete -  Palliative Care Screening Not Applicable -  Medication Review Press photographer) Complete -  Some recent data might be hidden

## 2020-08-30 NOTE — Consult Note (Signed)
[facility] Consultation Oncology  Name: Daniel [name]      MRN: [id number]    Location: A304/A304-01  Date: 08/30/2020 Time:7:00 PM   REFERRING PHYSICIAN: Dr. Joesph Fillers  REASON FOR CONSULT: Mass in the duodenum    HISTORY OF PRESENT ILLNESS: Daniel [name] is a 70 year old African-American male who is seen in consultation today at the request of Dr. Joesph Fillers for further work-up and management of duodenal mass.  He was recently hospitalized from 06/19/2020 through 06/26/2020 for abdominal pain.  EGD on 06/21/2020 showed 2.5 x 3.4 cm polypoid mass in the second portion of the duodenum and 4 gastric ulcers, largest 1.5 x 2.5 cm.  Biopsy of the stomach consistent with benign gastric mucosa, negative for H. pylori.  Duodenal mass was not biopsied as he was on Plavix.  Incidental finding of dilated pancreatic duct.  MRCP was limited by motion on 06/25/2020.  He again presented to the ER on 08/28/2020 and was found to have severe hypomagnesemia around 0.7.  He has a dementia and resides at a long-term facility.  He used to work in [employer].  He is a prior smoker.  Not able to provide any family history because of dementia.   PAST MEDICAL HISTORY:   Past Medical History:  Diagnosis Date  . Diabetes mellitus without complication ([place])   . High cholesterol   . Hypertension     ALLERGIES: No Known Allergies    MEDICATIONS: I have reviewed the patient's current medications.     PAST SURGICAL HISTORY Past Surgical History:  Procedure Laterality Date  . BIOPSY  06/21/2020   Procedure: BIOPSY;  Surgeon: Daneil Dolin, MD;  [facility]: [facility];  Service: Endoscopy;;  gastric  . ESOPHAGOGASTRODUODENOSCOPY (EGD) WITH PROPOFOL N/A 06/21/2020   Procedure: ESOPHAGOGASTRODUODENOSCOPY (EGD) WITH PROPOFOL;  Surgeon: Daneil Dolin, MD;  Location: [facility];  Service: Endoscopy;  Laterality: N/A;  . HIP SURGERY     left  . KIDNEY SURGERY      FAMILY HISTORY: Family History  Family history  unknown: Yes    SOCIAL HISTORY:  reports that he has never smoked. He has never used smokeless tobacco. He reports that he does not drink alcohol and does not use drugs.  PERFORMANCE STATUS: The patient's performance status is 3 - Symptomatic, >50% confined to bed  PHYSICAL EXAM: Most Recent Vital Signs: Blood pressure 122/64, pulse 82, temperature 98.4 F (36.9 C), temperature source Oral, resp. rate 17, height 5\' 11"  (1.803 m), weight 150 lb (68 kg), SpO2 98 %. BP 122/64 (BP Location: Left Arm)   Pulse 82   Temp 98.4 F (36.9 C) (Oral)   Resp 17   Ht 5\' 11"  (1.803 m)   Wt 150 lb (68 kg)   SpO2 98%   BMI 20.92 kg/m  General appearance: alert, cooperative and appears stated age Neck: no adenopathy Lungs: clear to auscultation bilaterally Heart: regular rate and rhythm Abdomen: soft, non-tender; bowel sounds normal; no masses,  no organomegaly Extremities: Right leg swollen than the left leg.  No edema in the left leg. Skin: Skin color, texture, turgor normal. No rashes or lesions Lymph nodes: Cervical, supraclavicular, and axillary nodes normal. Neurologic: Mental status: Not oriented to the place, person or time.  LABORATORY DATA:  Results for orders placed or performed during the hospital encounter of 08/28/20 (from the past 48 hour(s))  Glucose, capillary     Status: Abnormal   Collection Time: 08/29/20 12:52 AM  Result Value Ref  Range   Glucose-Capillary 149 (H) 70 - 99 mg/dL    Comment: Glucose reference range applies only to samples taken after fasting for at least 8 hours.  Magnesium     Status: Abnormal   Collection Time: 08/29/20  6:18 AM  Result Value Ref Range   Magnesium 1.2 (L) 1.7 - 2.4 mg/dL    Comment: Performed at [facility], [address], [place], [place] [address]  Glucose, capillary     Status: Abnormal   Collection Time: 08/29/20  7:28 AM  Result Value Ref Range   Glucose-Capillary 140 (H) 70 - 99 mg/dL    Comment: Glucose reference range  applies only to samples taken after fasting for at least 8 hours.  Glucose, capillary     Status: Abnormal   Collection Time: 08/29/20 11:02 AM  Result Value Ref Range   Glucose-Capillary 138 (H) 70 - 99 mg/dL    Comment: Glucose reference range applies only to samples taken after fasting for at least 8 hours.  Magnesium, urine     Status: None   Collection Time: 08/29/20  3:29 PM  Result Value Ref Range   Magnesium, urine 14.4 Not Estab. mg/dL    Comment: (NOTE) Performed At: [facility] [place], [place] [id number] Rush Farmer MD [id number]   Creatinine, urine, random     Status: None   Collection Time: 08/29/20  3:29 PM  Result Value Ref Range   Creatinine, Urine 46.68 mg/dL    Comment: Performed at [facility], [address] [place], [place] [address]  Glucose, capillary     Status: Abnormal   Collection Time: 08/29/20  4:13 PM  Result Value Ref Range   Glucose-Capillary 196 (H) 70 - 99 mg/dL    Comment: Glucose reference range applies only to samples taken after fasting for at least 8 hours.  Glucose, capillary     Status: Abnormal   Collection Time: 08/29/20  8:15 PM  Result Value Ref Range   Glucose-Capillary 164 (H) 70 - 99 mg/dL    Comment: Glucose reference range applies only to samples taken after fasting for at least 8 hours.  Renal function panel     Status: Abnormal   Collection Time: 08/30/20  6:10 AM  Result Value Ref Range   Sodium 135 135 - 145 mmol/L   Potassium 3.9 3.5 - 5.1 mmol/L   Chloride 97 (L) 98 - 111 mmol/L   CO2 28 22 - 32 mmol/L   Glucose, Bld 144 (H) 70 - 99 mg/dL    Comment: Glucose reference range applies only to samples taken after fasting for at least 8 hours.   BUN 14 8 - 23 mg/dL   Creatinine, Ser 0.89 0.61 - 1.24 mg/dL   Calcium 10.3 8.9 - 10.3 mg/dL   Phosphorus 2.8 2.5 - 4.6 mg/dL   Albumin 2.3 (L) 3.5 - 5.0 g/dL   GFR, Estimated >60 >60 mL/min    Comment: (NOTE) Calculated using the CKD-EPI  Creatinine Equation (2021)    Anion gap 10 5 - 15    Comment: Performed at [facility], [address], [place], [place] [address]  Magnesium     Status: Abnormal   Collection Time: 08/30/20  6:10 AM  Result Value Ref Range   Magnesium 1.2 (L) 1.7 - 2.4 mg/dL    Comment: Performed at [facility], [address] [place], [place] [address]  Glucose, capillary     Status: Abnormal   Collection Time: 08/30/20  7:46 AM  Result Value Ref Range   Glucose-Capillary 141 (H) 70 - 99 mg/dL    Comment: Glucose reference range applies only to samples taken after fasting for at least 8 hours.  Glucose, capillary     Status: Abnormal   Collection Time: 08/30/20 11:10 AM  Result Value Ref Range   Glucose-Capillary 222 (H) 70 - 99 mg/dL    Comment: Glucose reference range applies only to samples taken after fasting for at least 8 hours.  Glucose, capillary     Status: Abnormal   Collection Time: 08/30/20  4:13 PM  Result Value Ref Range   Glucose-Capillary 166 (H) 70 - 99 mg/dL    Comment: Glucose reference range applies only to samples taken after fasting for at least 8 hours.   Comment 1 Notify RN    Comment 2 Document in Chart       RADIOGRAPHY: No results found.       ASSESSMENT and PLAN:  1.  Duodenal mass: -CTAP on 06/19/2020 for epigastric pain showed patulous distal esophagus with wall thickening, fluid-filled stomach with equivocal prepyloric gastric wall thickening.  Moderate to large volume colonic stool.  Solitary right kidney with multiple cysts. -EGD on 06/21/2020 showed a 2.5 x 3.5 cm polypoid mass in the second portion of the duodenum opposite the ampulla.  4 gastric ulcers, largest approximately 1.5 x 2.5 cm. -Biopsy of the stomach consistent with gastric oxyntic mucosa with parietal cell hyperplasia, stains negative for H. pylori. -MRI/MRCP on 06/25/2020 shows degraded exam.  Normal noncontrast appearance of the liver.  No biliary ductal dilatation.  Pancreas is suboptimally  evaluated.  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  No gross peripancreatic edema.  Spleen is grossly within normal limits.  Left kidney not identified. -He has an appointment to see Dr. Rush Landmark and talk about possible biopsy. -I do not believe he is a candidate for any active chemotherapy if he needs it in the future.  But I agree with obtaining a biopsy of the mass. -I have tried to contact [name] (legal guardian) and left a voicemail on her phone.  2.  Right leg swelling: -Not sure if it is acute or chronic.  Consider Doppler of right leg to evaluate for DVT.  3.  CVA/dementia: -He is on Plavix.  He is also on Aricept and Namenda.  4.  Severe esophagitis, gastric ulcers: -Continue Protonix and Carafate.  All questions were answered. The patient knows to call the clinic with any problems, questions or concerns. We can certainly see the patient much sooner if necessary.   Derek Jack

## 2020-08-30 NOTE — Progress Notes (Signed)
Called at 330 and again at [phone] to give report not one would answer. EMS transport called.

## 2020-08-30 NOTE — Discharge Instructions (Signed)
1) weekly CBC, BMP and Serum Magnesium Blood test every Monday starting September 03, 2020 2)Avoid ibuprofen/Advil/Aleve/Motrin/Goody Powders/Naproxen/BC powders/Meloxicam/Diclofenac/Indomethacin and other Nonsteroidal anti-inflammatory medications as these will make you more likely to bleed and can cause stomach ulcers, can also cause Kidney problems.  3)Follow-up with GI physician Dr. Rush Landmark as previously planned for endoscopy and biopsies 4)Magnesium Oxide has been changed to 800 mg 3 times daily 5)-sliding scale insulin coverage as follows --insulin aspart (novoLOG) injection 0-10 Units 0-10 Units Subcutaneous, 3 times daily with meals CBG < 70: Implement Hypoglycemia Standing Orders and refer to Hypoglycemia Standing Orders sidebar report  CBG 70 - 120: 0 unit CBG 121 - 150: 0 unit  CBG 151 - 200: 1 unit CBG 201 - 250: 2 units CBG 251 - 300: 4 units CBG 301 - 350: 6 units  CBG 351 - 400: 8 units  CBG > 400: 10 units

## 2020-09-04 ENCOUNTER — Telehealth: Payer: Self-pay | Admitting: Gastroenterology

## 2020-09-04 ENCOUNTER — Ambulatory Visit: Payer: Medicare Other | Admitting: Gastroenterology

## 2020-09-04 NOTE — Telephone Encounter (Signed)
Patient's guardian called [name] to go over when the patient had an appointment advised her that it was scheduled for today and she said she was not notified nor the new center the patient is in which is [facility]. I advised her that a call was placed to her and [name] from group home. She stated the patient is no longer in the group home all appointments should be placed with just the [place] center so they can coordinate transportation. [name] is requesting to speak with a nurse regarding the patients needs for treatment and the schedule.

## 2020-09-04 NOTE — Telephone Encounter (Signed)
Thanks  Maymie Brunke Castaneda, MD Gastroenterology and Hepatology [facility]  

## 2020-09-04 NOTE — Telephone Encounter (Signed)
Dr Rush Landmark the pt guardian called to say that she and the [name] from the group home had some miscommunication regarding the appt for today.  I have rescheduled him for 09/14/20 at 930 am.

## 2020-09-04 NOTE — Telephone Encounter (Signed)
Thank you for the update. FYI Dr. Jenetta Downer. GM

## 2020-09-07 ENCOUNTER — Inpatient Hospital Stay (HOSPITAL_COMMUNITY)
Admission: EM | Admit: 2020-09-07 | Discharge: 2020-09-12 | DRG: 388 | Disposition: A | Discharge status: Skilled Nursing Facility | Payer: Medicare Other | Attending: Family Medicine | Admitting: Family Medicine

## 2020-09-07 ENCOUNTER — Emergency Department (HOSPITAL_COMMUNITY): Payer: Medicare Other

## 2020-09-07 ENCOUNTER — Other Ambulatory Visit: Payer: Self-pay

## 2020-09-07 ENCOUNTER — Encounter (HOSPITAL_COMMUNITY): Payer: Self-pay

## 2020-09-07 DIAGNOSIS — E785 Hyperlipidemia, unspecified: Secondary | ICD-10-CM | POA: Diagnosis present

## 2020-09-07 DIAGNOSIS — E876 Hypokalemia: Secondary | ICD-10-CM | POA: Diagnosis not present

## 2020-09-07 DIAGNOSIS — Z7984 Long term (current) use of oral hypoglycemic drugs: Secondary | ICD-10-CM

## 2020-09-07 DIAGNOSIS — F0390 Unspecified dementia without behavioral disturbance: Secondary | ICD-10-CM | POA: Diagnosis present

## 2020-09-07 DIAGNOSIS — Z20822 Contact with and (suspected) exposure to covid-19: Secondary | ICD-10-CM | POA: Diagnosis present

## 2020-09-07 DIAGNOSIS — Z993 Dependence on wheelchair: Secondary | ICD-10-CM

## 2020-09-07 DIAGNOSIS — I10 Essential (primary) hypertension: Secondary | ICD-10-CM | POA: Diagnosis present

## 2020-09-07 DIAGNOSIS — E1165 Type 2 diabetes mellitus with hyperglycemia: Secondary | ICD-10-CM | POA: Diagnosis present

## 2020-09-07 DIAGNOSIS — E119 Type 2 diabetes mellitus without complications: Secondary | ICD-10-CM

## 2020-09-07 DIAGNOSIS — J690 Pneumonitis due to inhalation of food and vomit: Secondary | ICD-10-CM | POA: Diagnosis present

## 2020-09-07 DIAGNOSIS — D649 Anemia, unspecified: Secondary | ICD-10-CM

## 2020-09-07 DIAGNOSIS — Z96642 Presence of left artificial hip joint: Secondary | ICD-10-CM | POA: Diagnosis present

## 2020-09-07 DIAGNOSIS — N400 Enlarged prostate without lower urinary tract symptoms: Secondary | ICD-10-CM | POA: Diagnosis present

## 2020-09-07 DIAGNOSIS — R140 Abdominal distension (gaseous): Secondary | ICD-10-CM | POA: Diagnosis present

## 2020-09-07 DIAGNOSIS — R109 Unspecified abdominal pain: Secondary | ICD-10-CM | POA: Diagnosis present

## 2020-09-07 DIAGNOSIS — O2230 Deep phlebothrombosis in pregnancy, unspecified trimester: Secondary | ICD-10-CM

## 2020-09-07 DIAGNOSIS — I82401 Acute embolism and thrombosis of unspecified deep veins of right lower extremity: Secondary | ICD-10-CM | POA: Diagnosis present

## 2020-09-07 DIAGNOSIS — E441 Mild protein-calorie malnutrition: Secondary | ICD-10-CM | POA: Diagnosis present

## 2020-09-07 DIAGNOSIS — Z794 Long term (current) use of insulin: Secondary | ICD-10-CM

## 2020-09-07 DIAGNOSIS — K56600 Partial intestinal obstruction, unspecified as to cause: Principal | ICD-10-CM

## 2020-09-07 DIAGNOSIS — K5909 Other constipation: Secondary | ICD-10-CM

## 2020-09-07 DIAGNOSIS — Z8673 Personal history of transient ischemic attack (TIA), and cerebral infarction without residual deficits: Secondary | ICD-10-CM

## 2020-09-07 DIAGNOSIS — Z681 Body mass index (BMI) 19 or less, adult: Secondary | ICD-10-CM

## 2020-09-07 DIAGNOSIS — Z7902 Long term (current) use of antithrombotics/antiplatelets: Secondary | ICD-10-CM

## 2020-09-07 DIAGNOSIS — Z79899 Other long term (current) drug therapy: Secondary | ICD-10-CM

## 2020-09-07 DIAGNOSIS — R64 Cachexia: Secondary | ICD-10-CM | POA: Diagnosis present

## 2020-09-07 DIAGNOSIS — K3189 Other diseases of stomach and duodenum: Secondary | ICD-10-CM

## 2020-09-07 DIAGNOSIS — R1909 Other intra-abdominal and pelvic swelling, mass and lump: Secondary | ICD-10-CM | POA: Diagnosis present

## 2020-09-07 DIAGNOSIS — E7800 Pure hypercholesterolemia, unspecified: Secondary | ICD-10-CM | POA: Diagnosis present

## 2020-09-07 DIAGNOSIS — R739 Hyperglycemia, unspecified: Secondary | ICD-10-CM

## 2020-09-07 DIAGNOSIS — I82409 Acute embolism and thrombosis of unspecified deep veins of unspecified lower extremity: Secondary | ICD-10-CM

## 2020-09-07 LAB — CBC WITH DIFFERENTIAL/PLATELET
Abs Immature Granulocytes: 0.04 10*3/uL (ref 0.00–0.07)
Basophils Absolute: 0 10*3/uL (ref 0.0–0.1)
Basophils Relative: 0 %
Eosinophils Absolute: 0 10*3/uL (ref 0.0–0.5)
Eosinophils Relative: 0 %
HCT: 32.5 % — ABNORMAL LOW (ref 39.0–52.0)
Hemoglobin: 10.6 g/dL — ABNORMAL LOW (ref 13.0–17.0)
Immature Granulocytes: 1 %
Lymphocytes Relative: 9 %
Lymphs Abs: 0.7 10*3/uL (ref 0.7–4.0)
MCH: 27.5 pg (ref 26.0–34.0)
MCHC: 32.6 g/dL (ref 30.0–36.0)
MCV: 84.4 fL (ref 80.0–100.0)
Monocytes Absolute: 0.4 10*3/uL (ref 0.1–1.0)
Monocytes Relative: 5 %
Neutro Abs: 6.9 10*3/uL (ref 1.7–7.7)
Neutrophils Relative %: 85 %
Platelets: 390 10*3/uL (ref 150–400)
RBC: 3.85 MIL/uL — ABNORMAL LOW (ref 4.22–5.81)
RDW: 16.4 % — ABNORMAL HIGH (ref 11.5–15.5)
WBC: 8 10*3/uL (ref 4.0–10.5)
nRBC: 0 % (ref 0.0–0.2)

## 2020-09-07 LAB — COMPREHENSIVE METABOLIC PANEL
ALT: 56 U/L — ABNORMAL HIGH (ref 0–44)
AST: 39 U/L (ref 15–41)
Albumin: 3.1 g/dL — ABNORMAL LOW (ref 3.5–5.0)
Alkaline Phosphatase: 128 U/L — ABNORMAL HIGH (ref 38–126)
Anion gap: 15 (ref 5–15)
BUN: 27 mg/dL — ABNORMAL HIGH (ref 8–23)
CO2: 31 mmol/L (ref 22–32)
Calcium: 11.8 mg/dL — ABNORMAL HIGH (ref 8.9–10.3)
Chloride: 87 mmol/L — ABNORMAL LOW (ref 98–111)
Creatinine, Ser: 0.93 mg/dL (ref 0.61–1.24)
GFR, Estimated: >60 mL/min (ref >60)
Glucose, Bld: 284 mg/dL — ABNORMAL HIGH (ref 70–99)
Potassium: 3.8 mmol/L (ref 3.5–5.1)
Sodium: 133 mmol/L — ABNORMAL LOW (ref 135–145)
Total Bilirubin: 0.5 mg/dL (ref 0.3–1.2)
Total Protein: 7.5 g/dL (ref 6.5–8.1)

## 2020-09-07 LAB — MAGNESIUM: Magnesium: 1.1 mg/dL — ABNORMAL LOW (ref 1.7–2.4)

## 2020-09-07 LAB — POC OCCULT BLOOD, ED: Fecal Occult Bld: NEGATIVE

## 2020-09-07 IMAGING — DX DG ABDOMEN ACUTE W/ 1V CHEST
4 series · 4 of 4 positions shown · non-contrast
Comparison: CT [DATE], MR [DATE], CT [DATE]

CLINICAL DATA: Abdominal distension, emesis and lower extremity
swelling

EXAM:
DG ABDOMEN ACUTE WITH 1 VIEW CHEST

[chest ap]
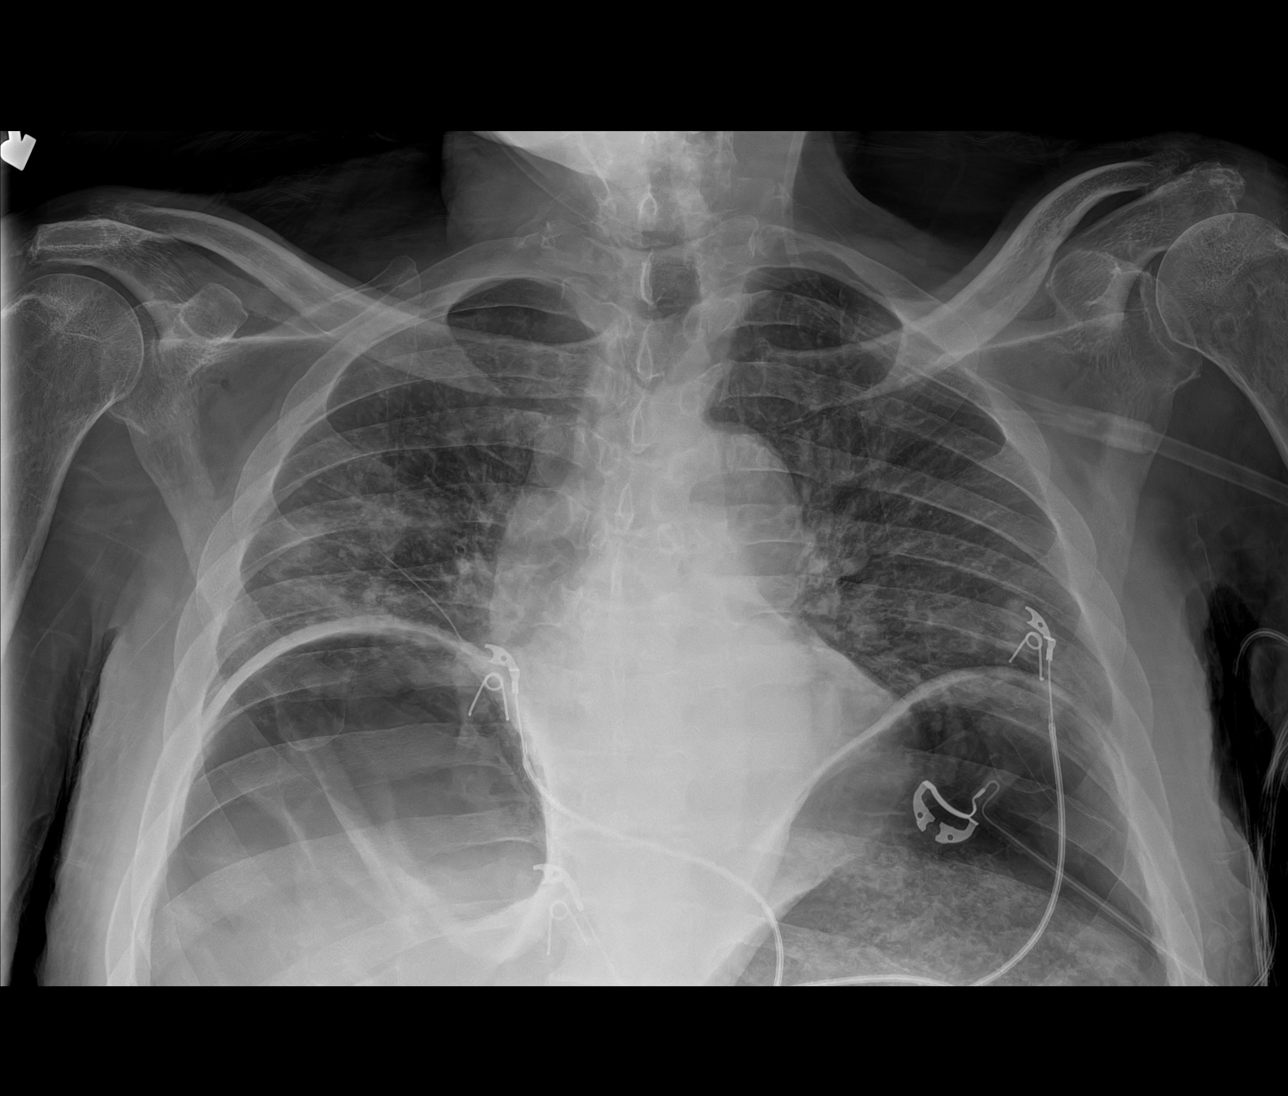

[abdomen erect ap]
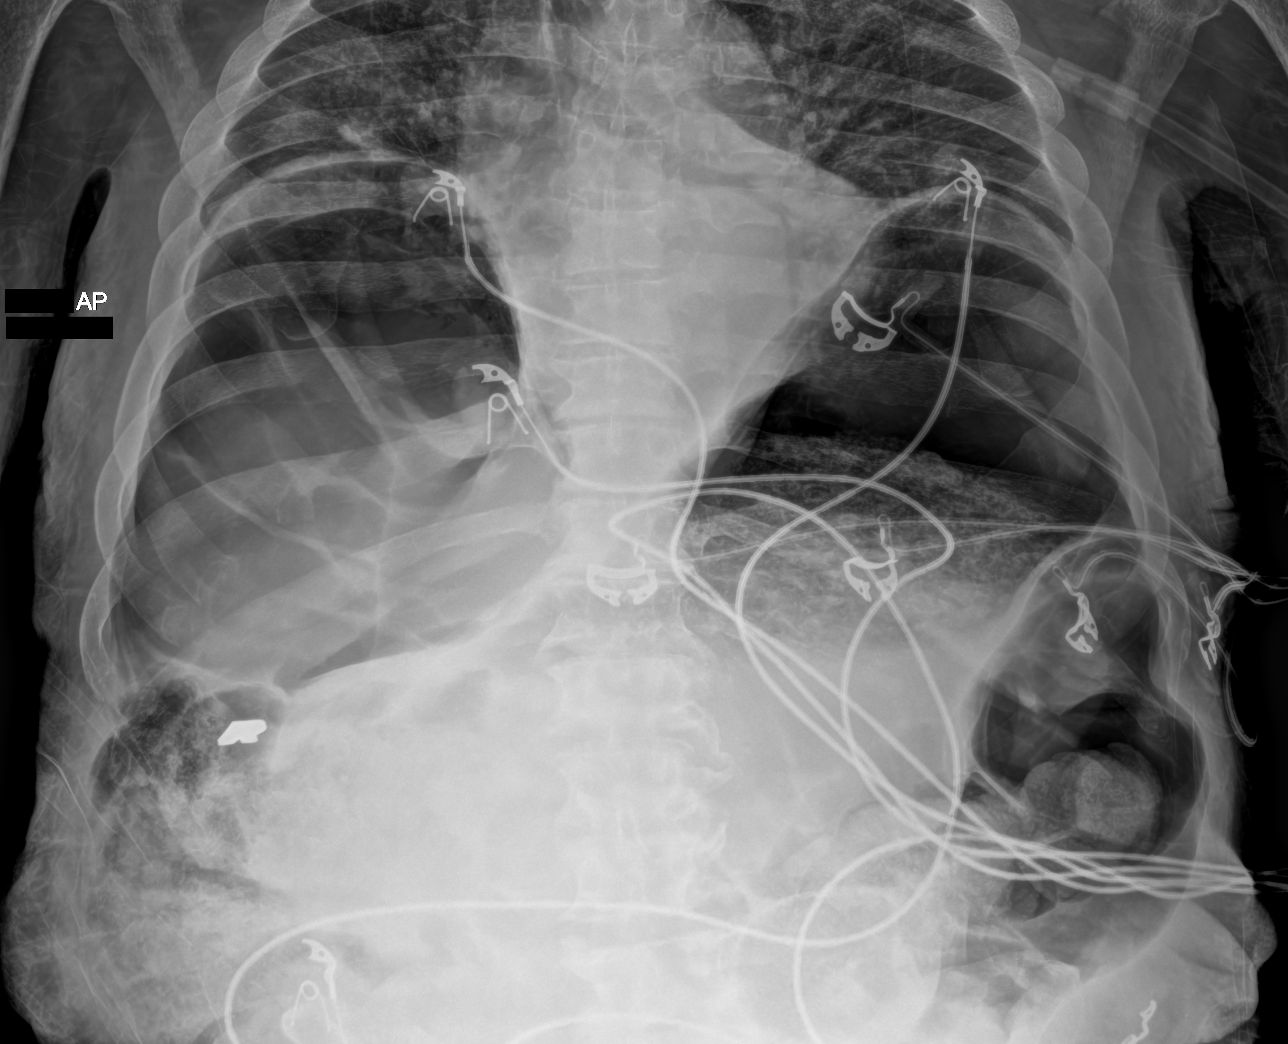

[abdomen supine (1 of 2)]
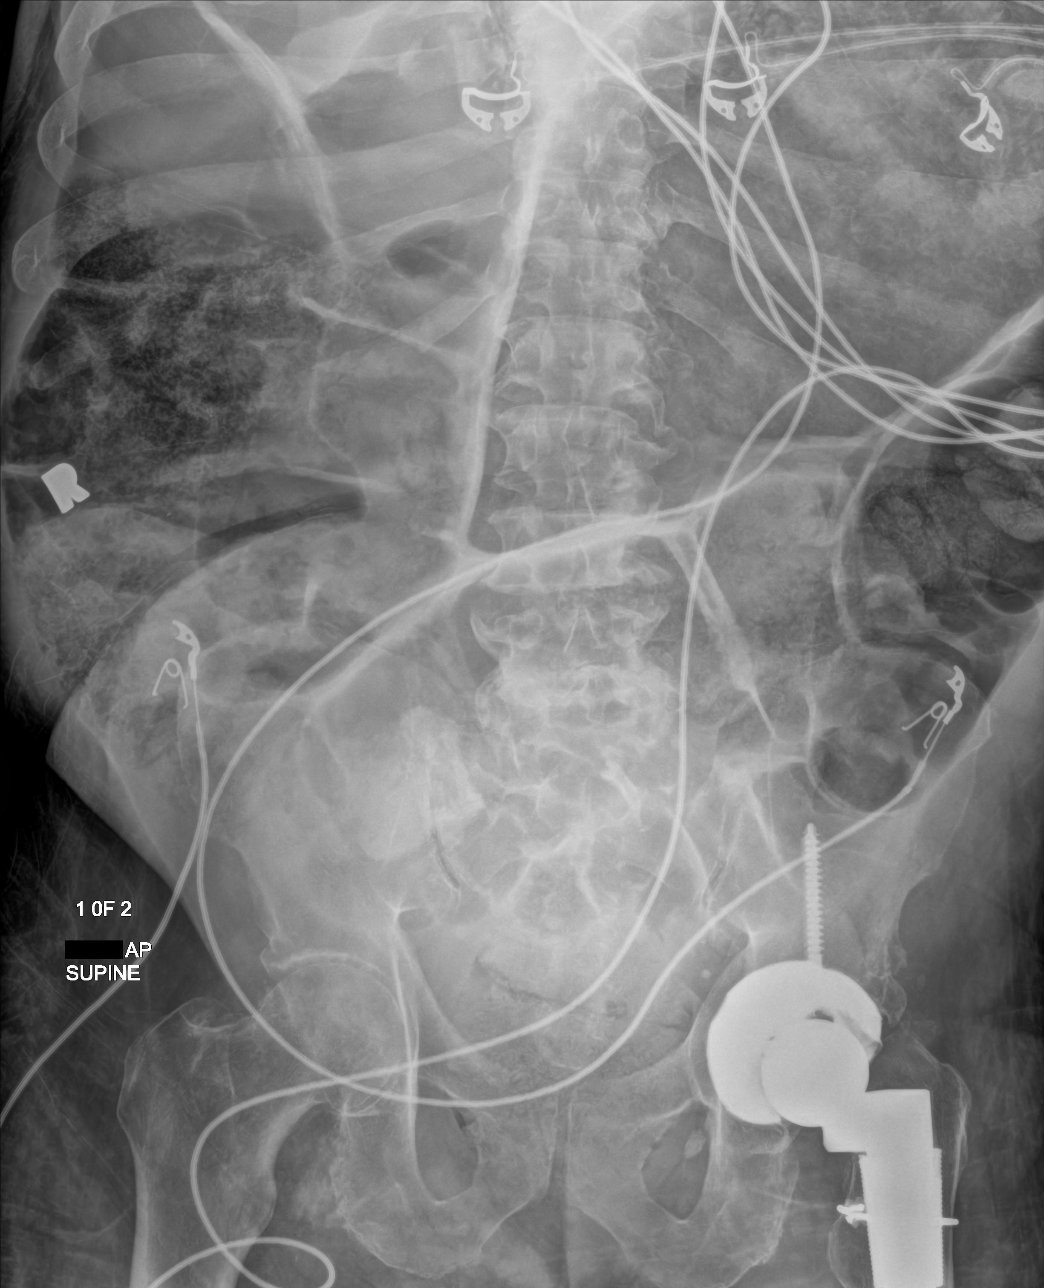

[abdomen supine (2 of 2)]
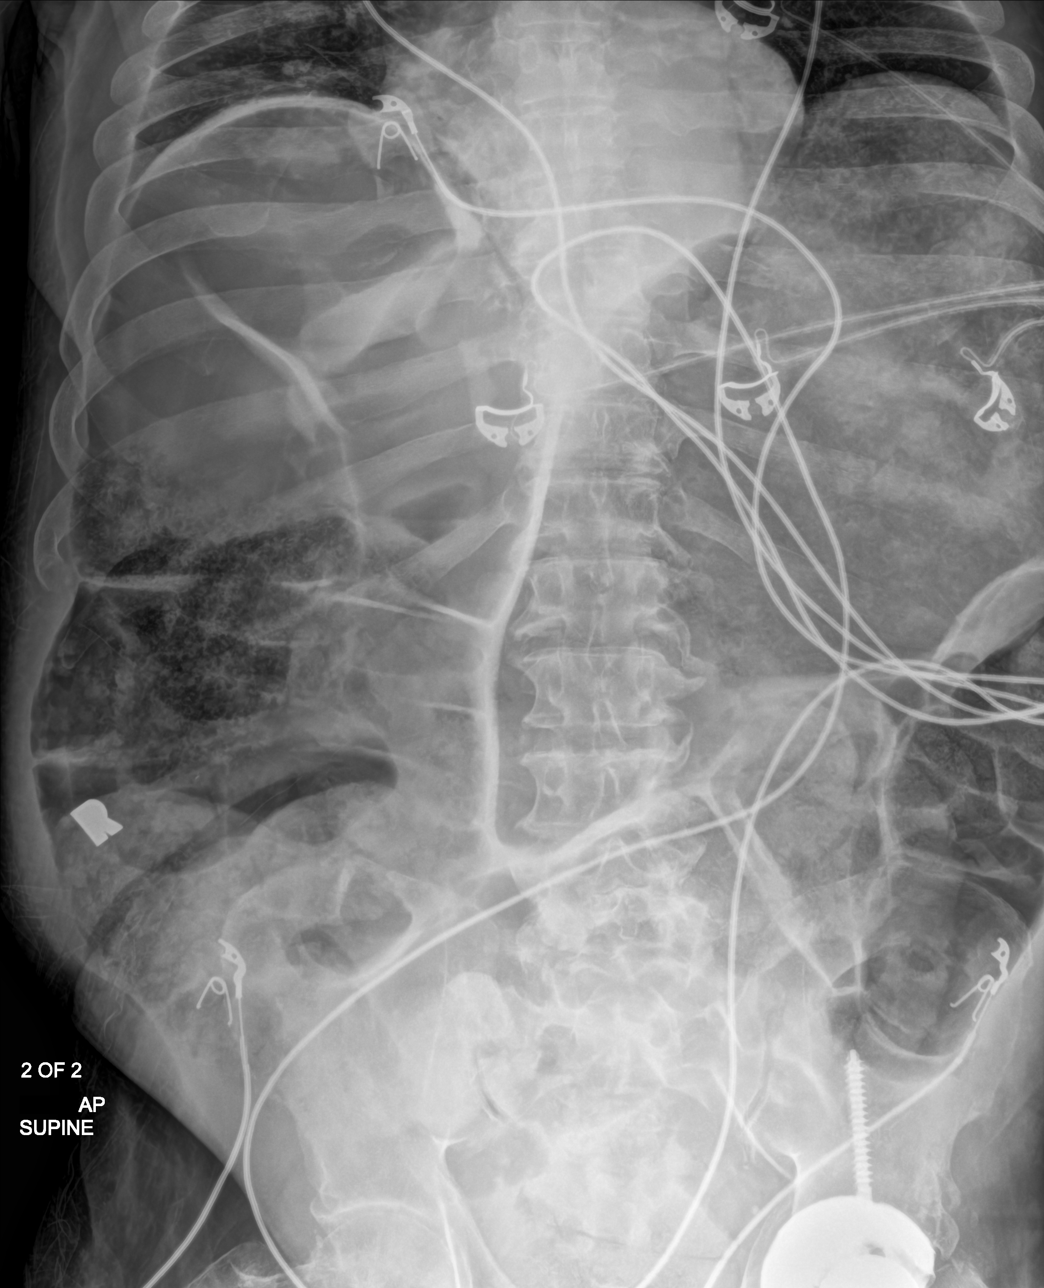

[4 of 4 positions shown; findings below may reference images not displayed]

FINDINGS: There is extensive severe and increasing air distention of what
appears to be the colon given some haustra markings although several
distended loops of bowel in the low pelvis could reflect some small
bowel dilatation as well. A large volume of feculent material is
noted within portions of the distended colon as well. No convincing
evidence of subdiaphragmatic free air at this time. No suspicious
abdominal calcifications.

Lung volumes are markedly diminished. Some streaky and patchy
opacities in the lung bases may reflect atelectasis, airspace
disease or sequela of aspiration in the provided clinical context.
Cardiomediastinal contours are unremarkable for the portable upright
technique. No pneumothorax. No layering pleural effusion. Telemetry
leads overlie the chest and abdomen with nasal cannula the base of
the neck. Multilevel degenerative changes in the shoulders, spine,
hips and pelvis with a prior left hip arthroplasty.
IMPRESSION: Severe and increasing air distention of what appears to be the colon
given some haustra markings although several distended loops of
bowel in the low pelvis could reflect some small bowel dilatation as
well. Large volume of feculent material within portions of the
distended colon as well. Appearance could reflect a colonic
obstruction or pseudo-obstruction versus ileus.

## 2020-09-07 IMAGING — CT CT ABD-PELV W/ CM
2 of 5 series · 15 of 46 positions shown, 17 images · IV contrast (Omnipaque or Isovue)
Comparison: [DATE], CT [DATE], CT [DATE]

CLINICAL DATA: Nausea and vomiting coffee ground emesis

EXAM:
CT ABDOMEN AND PELVIS WITH CONTRAST
TECHNIQUE: Multidetector CT imaging of the abdomen and pelvis was performed
using the standard protocol following bolus administration of
intravenous contrast.
CONTRAST:  100mL OMNIPAQUE IOHEXOL 300 MG/ML  SOLN

[Series 3: axial st · axial · 0.90mm/px · z∈[+745,+1195]mm · 12 of 102 slices shown, 14 images]
[im 6/102  soft-tissue]
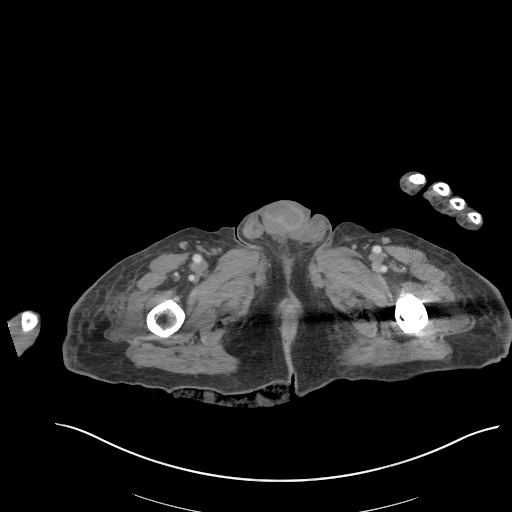
[im 6/102  bone]
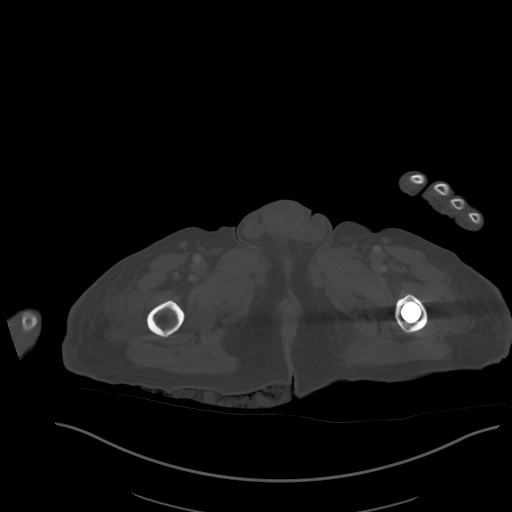
[im 17/102  soft-tissue]
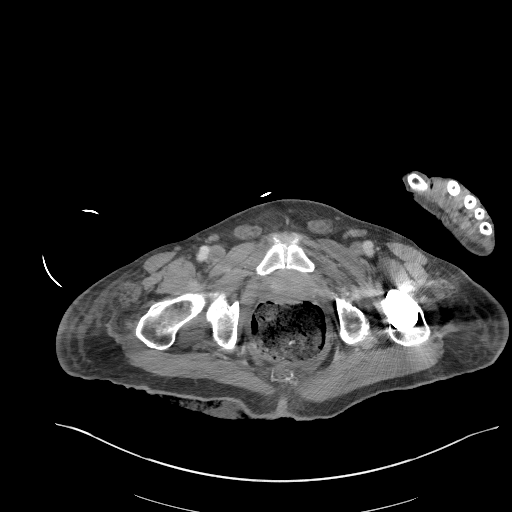
[im 23/102  soft-tissue]
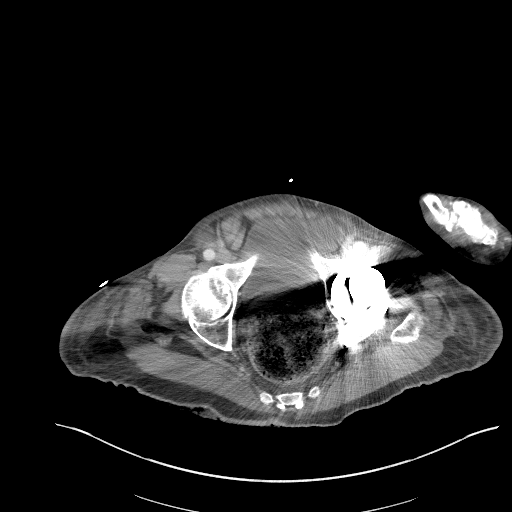
[im 29/102  soft-tissue]
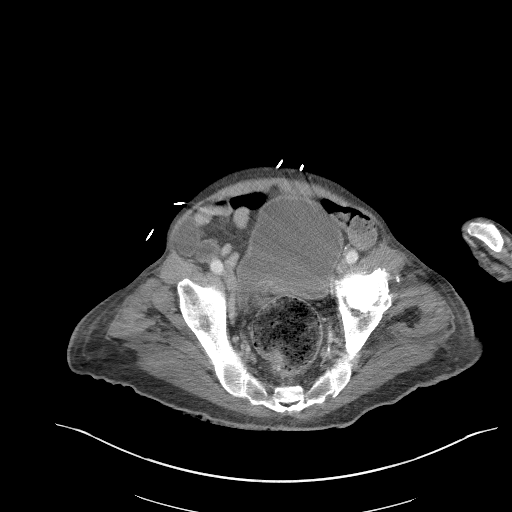
[im 40/102  soft-tissue]
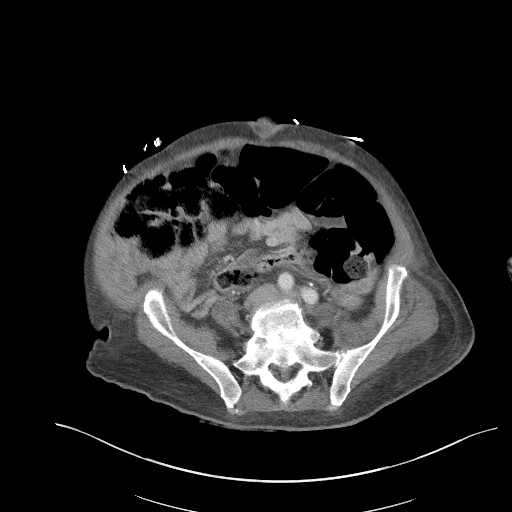
[im 45/102  soft-tissue]
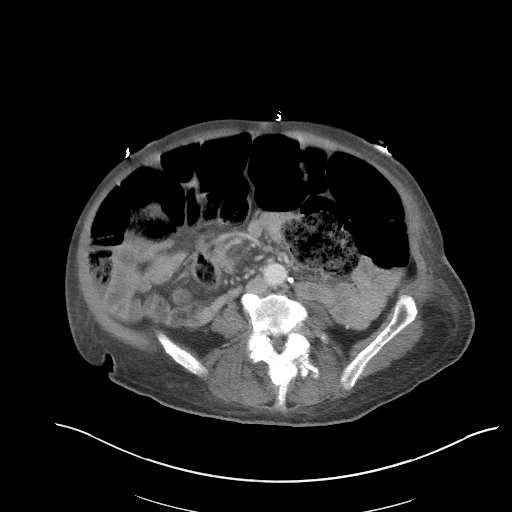
[im 57/102  soft-tissue]
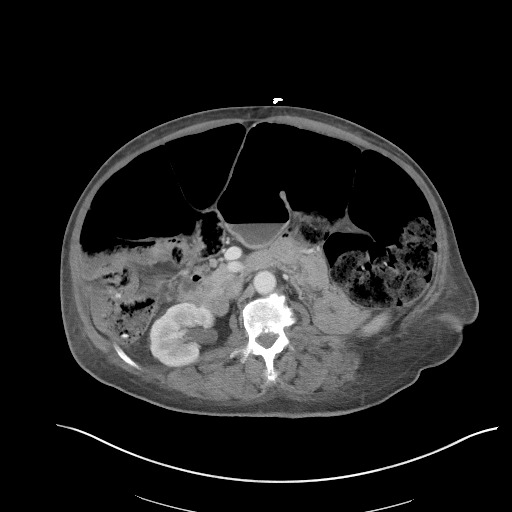
[im 62/102  soft-tissue]
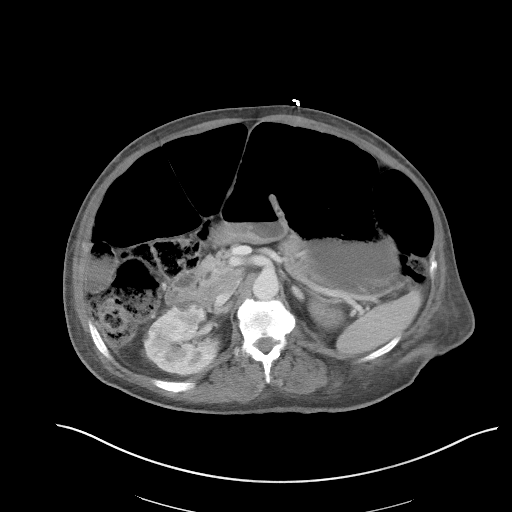
[im 73/102  soft-tissue]
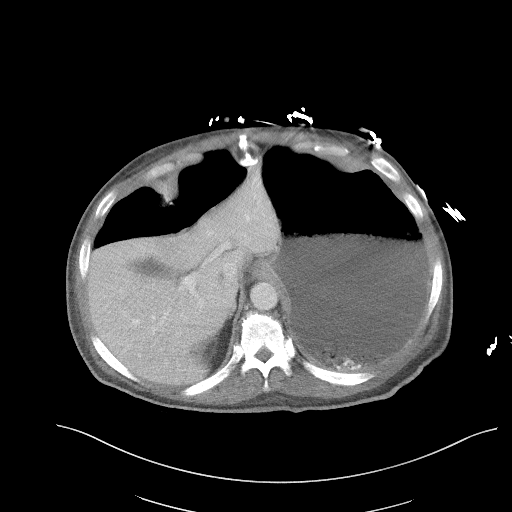
[im 73/102  bone]
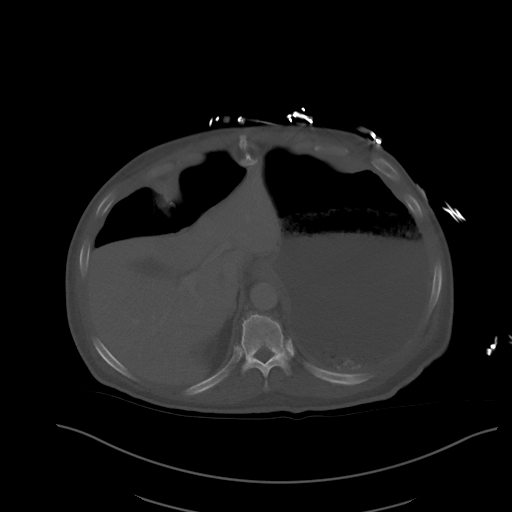
[im 79/102  soft-tissue]
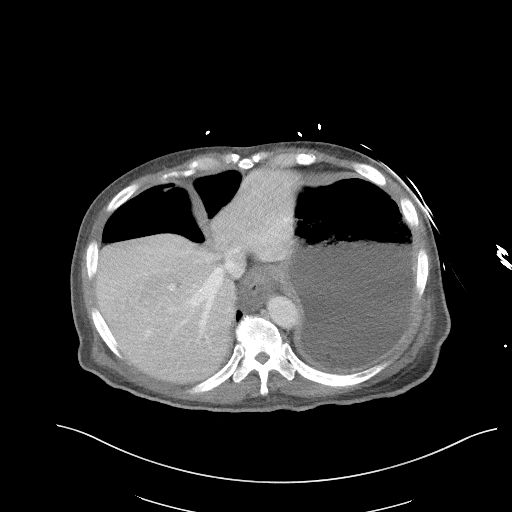
[im 85/102  soft-tissue]
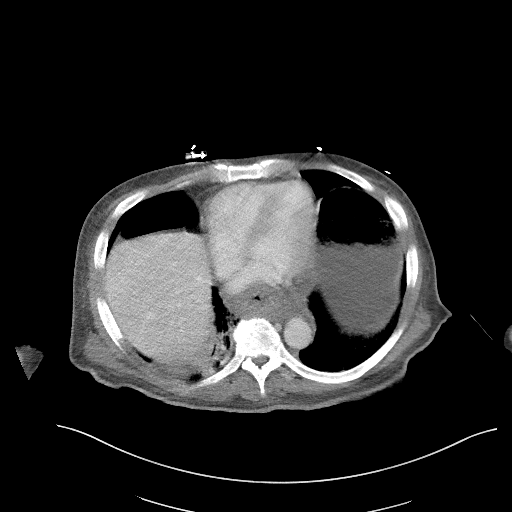
[im 96/102  soft-tissue]
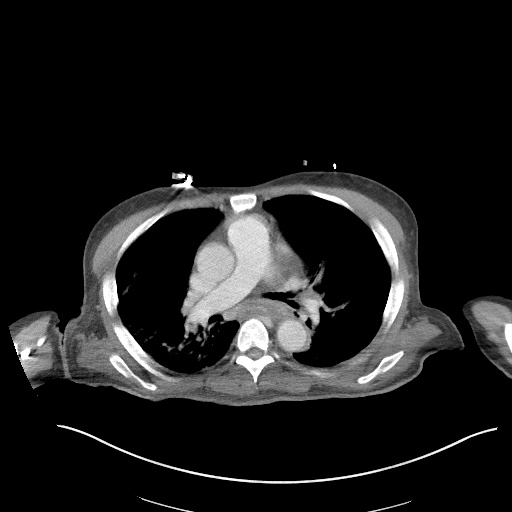

[Series 6: coronal st · coronal · 0.86mm/px · 3 of 117 slices shown]
[im 39/117  soft-tissue]
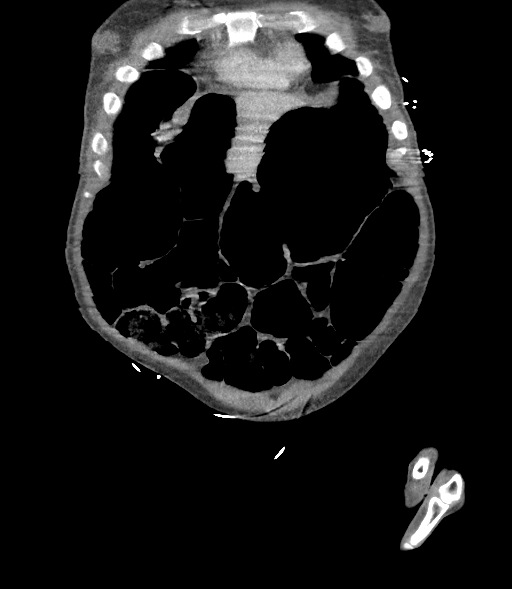
[im 52/117  soft-tissue]
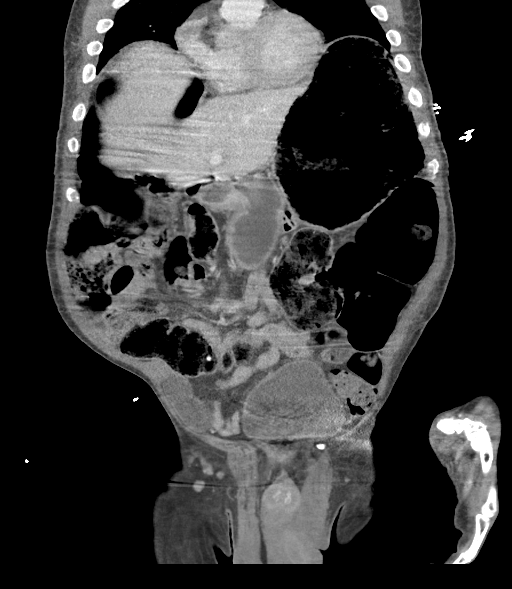
[im 65/117  soft-tissue]
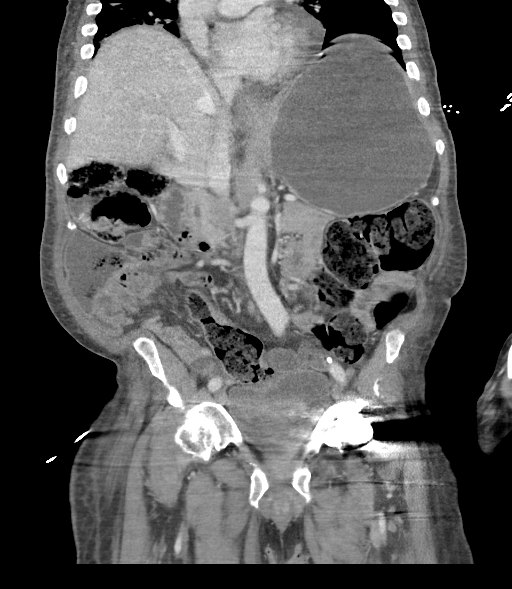

[15 of 46 positions shown; findings below may reference images not displayed]

FINDINGS: Lower chest: Lung bases demonstrate ground-glass density and patchy
consolidation in the right lower lobe and posterior right lower
lobe. No pleural effusion. Normal cardiac size. Marked
circumferential wall thickening of the distal esophagus.

Hepatobiliary: No calcified gallstone. No focal hepatic abnormality
or biliary dilatation

Pancreas: Truncated appearance of pancreas.  No inflammatory change.

Spleen: Normal in size without focal abnormality.

Adrenals/Urinary Tract: Adrenal glands are normal. No definitive
left renal tissue identified, question very diminutive atrophic left
kidney, series 3, image number 41. Small cysts in the right kidney.
Subcentimeter hypodense lesions too small to further characterize.
The bladder is normal.

Stomach/Bowel: Marked fluid distension of the stomach. Some
fluid-filled nondistended small bowel in the pelvis. Large volume of
stool in the colon. Marked air distension of colon in the right
upper quadrant, this appears to be sigmoid colon. Slightly narrowed
appearing sigmoid colon that traverses horizontally and extends to
right upper quadrant, series 3 image number 55 through 65.
Considerable stool distal to the loop of dilated sigmoid colon with
large feces in the rectum. No acute bowel wall thickening. No
corkscrew appearance of mesentery or vasculature in the region of
sigmoid colon narrowing. Similar configuration of the colon on
[DATE] but more normal course of sigmoid colon on CT from
[DATE].

Vascular/Lymphatic: Nonaneurysmal aorta. Aortic atherosclerosis. No
suspicious nodes

Reproductive: Prostate is unremarkable.

Other: Negative for free air or free fluid

Musculoskeletal: Chronic pars defects at L5 with 15 mm
anterolisthesis L5 on S1 and ankylosis across the L5-S1 disc space.
Left hip arthroplasty with artifact.
IMPRESSION: 1. Interval marked circumferential wall thickening of the distal
esophagus, probably due to esophagitis. Marked fluid distension of
the stomach without evidence for small bowel obstruction, question
functional obstruction. Consider NG tube decompression.
2. Generalized distension of the colon with large feces throughout.
More focally dilated loop of bowel in the right upper quadrant
appears to represent dilated segment of sigmoid colon. Slightly
aberrant course of sigmoid colon which appears slightly narrowed at
the right lower quadrant with adjacent slightly narrowed segment of
rectosigmoid colon, question some degree of colon obstruction,
potentially from internal hernia. No corkscrew appearance to suggest
a volvulus. Considerable stool within the rectosigmoid colon distal
to this suggesting that obstruction may be partial. Note that a
similar configuration of the colon was present on CT from [DATE]
[DATE]. Ground-glass density and patchy consolidation in the right lower
lobe and posterior right upper lobe concerning for pneumonia or
aspiration.
4. Absent left kidney

Aortic Atherosclerosis ([1L]-[1L]).

## 2020-09-07 MED ORDER — ONDANSETRON HCL 4 MG/2ML IJ SOLN
4.0000 mg | Freq: Once | INTRAMUSCULAR | Status: AC
  Administered 2020-09-07: 4 mg via INTRAVENOUS
  Filled 2020-09-07: qty 2

## 2020-09-07 MED ORDER — SODIUM CHLORIDE 0.9 % IV BOLUS
1000.0000 mL | Freq: Once | INTRAVENOUS | Status: AC
  Administered 2020-09-07: 1000 mL via INTRAVENOUS

## 2020-09-07 MED ORDER — PANTOPRAZOLE SODIUM 40 MG IV SOLR
40.0000 mg | Freq: Once | INTRAVENOUS | Status: AC
  Administered 2020-09-07: 40 mg via INTRAVENOUS
  Filled 2020-09-07: qty 40

## 2020-09-07 MED ORDER — HEPARIN BOLUS VIA INFUSION
4000.0000 [IU] | Freq: Once | INTRAVENOUS | Status: AC
  Administered 2020-09-07: 4000 [IU] via INTRAVENOUS

## 2020-09-07 MED ORDER — IOHEXOL 300 MG/ML  SOLN
100.0000 mL | Freq: Once | INTRAMUSCULAR | Status: AC | PRN
  Administered 2020-09-07: 100 mL via INTRAVENOUS

## 2020-09-07 MED ORDER — HEPARIN (PORCINE) 25000 UT/250ML-% IV SOLN
1300.0000 [IU]/h | INTRAVENOUS | Status: DC
  Administered 2020-09-07: 1000 [IU]/h via INTRAVENOUS
  Administered 2020-09-08: 1100 [IU]/h via INTRAVENOUS
  Filled 2020-09-07 (×2): qty 250

## 2020-09-07 NOTE — ED Notes (Signed)
Pt sent from [facility] for concerns he may have RLE DVT. RLE from knee to ankle appears swollen, nonpitting. No obvious bruising observed. Pt denies pain. +pedal pulses bilaterally. Pt denies pain anywhere, denies dyspnea. Lungs clear with occasional congested cough. Pt alert to self, location. MD has been to bedside for eval. Connected to monitors.

## 2020-09-07 NOTE — ED Notes (Signed)
Pt vomited twice while this nurse was in the room. Emesis water brown in appearance. Notified MD.

## 2020-09-07 NOTE — ED Notes (Signed)
Pt unable to sign medical screening form due to mobility limitations.

## 2020-09-07 NOTE — ED Provider Notes (Addendum)
[facility] EMERGENCY DEPARTMENT Provider Note   CSN: [id number] Arrival date & time: 09/07/20  1934     History Chief Complaint  Patient presents with  . Medical Clearance    Possible DVT Per [facility] staff    Daniel [name] is a 70 y.o. male.  Patient with abdominal pain with vomiting and diagnosis of DVT on the right lower extremity.  Patient has a history of ulcers along with duodenal mass  The history is provided by the patient and medical records. No language interpreter was used.  Abdominal Pain Pain location:  Generalized Pain quality: aching   Pain radiates to:  Does not radiate Pain severity:  Mild Onset quality:  Sudden Timing:  Constant Chronicity:  New Context: not alcohol use   Relieved by:  Nothing Worsened by:  Nothing Associated symptoms: vomiting   Associated symptoms: no chest pain, no cough, no diarrhea, no fatigue and no hematuria        Past Medical History:  Diagnosis Date  . Diabetes mellitus without complication ([place])   . High cholesterol   . Hypertension     Patient Active Problem List   Diagnosis Date Noted  . Multiple gastric ulcers 07/24/2020  . Esophagitis 07/24/2020  . 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 Abdominal pain 06/20/2020  . Normochromic normocytic anemia   . Upper abdominal pain   . Abnormal CT scan, esophagus   . Abnormal CT scan, stomach   . Pancreatic abnormality   . Constipation   . Hyperglycemia   . History of stroke   . Hypokalemia   . Hypomagnesemia   . AKI (acute kidney injury) ([place]) 06/06/2020  . Acute CVA (cerebrovascular accident) ([place]) 01/05/2020  . Right knee pain 01/01/2017  . Osteoarthritis of right knee 01/01/2017  . Mental impairment 09/23/2016  . Hypertension 09/23/2016  . Type 2 diabetes mellitus without complication, without long-term current use of insulin ([place]) 09/23/2016  . BPH (benign prostatic hyperplasia) 09/23/2016  . Dementia ([place]) 09/23/2016  . Status post left hip replacement  02/01/2012    Past Surgical History:  Procedure Laterality Date  . BIOPSY  06/21/2020   Procedure: BIOPSY;  Surgeon: Daneil Dolin, MD;  Location: [facility] ENDO SUITE;  Service: Endoscopy;;  gastric  . ESOPHAGOGASTRODUODENOSCOPY (EGD) WITH PROPOFOL N/A 06/21/2020   Procedure: ESOPHAGOGASTRODUODENOSCOPY (EGD) WITH PROPOFOL;  Surgeon: Daneil Dolin, MD;  Location: [facility] ENDO SUITE;  Service: Endoscopy;  Laterality: N/A;  . HIP SURGERY     left  . KIDNEY SURGERY         Family History  Family history unknown: Yes    Social History   Tobacco Use  . Smoking status: Never Smoker  . Smokeless tobacco: Never Used  Vaping Use  . Vaping Use: Never used  Substance Use Topics  . Alcohol use: No  . Drug use: No    Home Medications Prior to Admission medications   Medication Sig Start Date End Date Taking? Authorizing Provider  acetaminophen (TYLENOL) 325 MG tablet Take 2 tablets (650 mg total) by mouth every 6 (six) hours as needed for mild pain, fever or headache (or Fever >/= 101). 08/30/20   Emokpae, Courage, MD  aMILoride (MIDAMOR) 5 MG tablet Take 1 tablet (5 mg total) by mouth daily. 08/31/20   Roxan Hockey, MD  amitriptyline (ELAVIL) 10 MG tablet Take 10 mg by mouth at bedtime. 04/30/20   [provider]  atorvastatin (LIPITOR) 40 MG tablet Take 1 tablet (40 mg total) by mouth  daily. 08/30/20 09/29/20  Roxan Hockey, MD  Cholecalciferol (VITAMIN D3) 2000 units capsule TAKE 1 CAPSULE BY MOUTH ONCE DAILY. **DO NOT CRUSH** Patient taking differently: Take 2,000 Units by mouth daily. 01/06/18   Fayrene Helper, MD  donepezil (ARICEPT) 10 MG tablet TAKE (1) TABLET BY MOUTH ONCE DAILY. Patient taking differently: Take 10 mg by mouth daily. 06/15/17   Raylene Everts, MD  dronabinol (MARINOL) 2.5 MG capsule Take 2.5 mg by mouth 2 (two) times daily before lunch and supper.    [provider]  feeding supplement (ENSURE ENLIVE / ENSURE PLUS) LIQD Take 237 mLs by mouth 2  (two) times daily between meals. 06/22/20   Manuella Ghazi, Pratik D, DO  FEROSUL 325 (65 Fe) MG tablet TAKE 1 TABLET BY MOUTH TWICE DAILY. Patient taking differently: Take 325 mg by mouth in the morning and at bedtime. 06/15/17   Raylene Everts, MD  folic acid (FOLVITE) 683 MCG tablet Take 800 mcg by mouth daily.    [provider]  gentian violet 1 % topical solution Apply 1 mL topically See admin instructions. Applied between toes daily Patient not taking: No sig reported    [provider]  insulin aspart (NOVOLOG) 100 UNIT/ML FlexPen Inject 0-10 Units into the skin 3 (three) times daily with meals. insulin aspart (novoLOG) injection 0-10 Units 0-10 Units Subcutaneous, 3 times daily with meals CBG < 70: Implement Hypoglycemia Standing Orders and refer to Hypoglycemia Standing Orders sidebar report  CBG 70 - 120: 0 unit CBG 121 - 150: 0 unit  CBG 151 - 200: 1 unit CBG 201 - 250: 2 units CBG 251 - 300: 4 units CBG 301 - 350: 6 units  CBG 351 - 400: 8 units  CBG > 400: 10 units 08/30/20   Emokpae, Courage, MD  magnesium oxide (MAG-OX) 400 MG tablet Take 2 tablets (800 mg total) by mouth 3 (three) times daily. 08/30/20   Roxan Hockey, MD  memantine (NAMENDA) 10 MG tablet Take 10 mg by mouth 2 (two) times daily. 06/07/20   [provider]  metFORMIN (GLUCOPHAGE) 1000 MG tablet Take 1 tablet (1,000 mg total) by mouth 2 (two) times daily with a meal. 06/08/20   Barton Dubois, MD  metoprolol tartrate (LOPRESSOR) 25 MG tablet Take 1 tablet (25 mg total) by mouth 2 (two) times daily. 08/30/20 09/29/20  Roxan Hockey, MD  ondansetron (ZOFRAN) 4 MG tablet Take 1 tablet (4 mg total) by mouth every 6 (six) hours as needed for nausea. 06/22/20   Manuella Ghazi, Pratik D, DO  oxybutynin (DITROPAN-XL) 5 MG 24 hr tablet Take 5 mg by mouth daily. 01/04/20   [provider]  OXYGEN Inhale 2 L into the lungs daily.    [provider]  pantoprazole (PROTONIX) 40 MG tablet Take 1 tablet (40 mg  total) by mouth daily. 08/30/20 11/28/20  Roxan Hockey, MD  sucralfate (CARAFATE) 1 g tablet Take 1 tablet (1 g total) by mouth 4 (four) times daily. 06/22/20 06/22/21  Manuella Ghazi, Pratik D, DO  tamsulosin (FLOMAX) 0.4 MG CAPS capsule TAKE 1 CAPSULE BY MOUTH AT BEDTIME. Patient taking differently: Take 0.4 mg by mouth at bedtime. 06/15/17   Raylene Everts, MD  traZODone (DESYREL) 100 MG tablet Take 100 mg by mouth at bedtime. 01/04/20   [provider]  vitamin B-12 (CYANOCOBALAMIN) 500 MCG tablet Take 1,000 mcg by mouth daily.    [provider]  ramipril (ALTACE) 10 MG capsule Take 2 capsules (20 mg  total) by mouth daily. 06/08/20 06/16/20  Barton Dubois, MD    Allergies    Patient has no known allergies.  Review of Systems   Review of Systems  Constitutional: Negative for appetite change and fatigue.  HENT: Negative for congestion, ear discharge and sinus pressure.   Eyes: Negative for discharge.  Respiratory: Negative for cough.   Cardiovascular: Negative for chest pain.  Gastrointestinal: Positive for abdominal pain and vomiting. Negative for diarrhea.  Genitourinary: Negative for frequency and hematuria.  Musculoskeletal: Negative for back pain.  Skin: Negative for rash.  Neurological: Negative for seizures and headaches.  Psychiatric/Behavioral: Negative for hallucinations.    Physical Exam Updated Vital Signs BP (!) 146/97   Pulse (!) 105   Temp 98.7 F (37.1 C) (Oral)   Resp (!) 25   Ht 5\' 11"  (1.803 m)   Wt 68 kg   SpO2 100%   BMI 20.91 kg/m   Physical Exam Vitals and nursing note reviewed.  Constitutional:      Appearance: He is well-developed.  HENT:     Head: Normocephalic.     Mouth/Throat:     Mouth: Mucous membranes are moist.  Eyes:     General: No scleral icterus.    Conjunctiva/sclera: Conjunctivae normal.  Neck:     Thyroid: No thyromegaly.  Cardiovascular:     Rate and Rhythm: Normal rate and regular rhythm.     Heart sounds: No  murmur heard. No friction rub. No gallop.   Pulmonary:     Breath sounds: No stridor. No wheezing or rales.  Chest:     Chest wall: No tenderness.  Abdominal:     General: There is distension.     Tenderness: There is no abdominal tenderness. There is no rebound.  Musculoskeletal:        General: Normal range of motion.     Cervical back: Neck supple.  Lymphadenopathy:     Cervical: No cervical adenopathy.  Skin:    Findings: No erythema or rash.  Neurological:     Mental Status: He is alert and oriented to person, place, and time.     Motor: No abnormal muscle tone.     Coordination: Coordination normal.  Psychiatric:        Behavior: Behavior normal.     ED Results / Procedures / Treatments   Labs (all labs ordered are listed, but only abnormal results are displayed) Labs Reviewed  CBC WITH DIFFERENTIAL/PLATELET - Abnormal; Notable for the following components:      Result Value   RBC 3.85 (*)    Hemoglobin 10.6 (*)    HCT 32.5 (*)    RDW 16.4 (*)    All other components within normal limits  COMPREHENSIVE METABOLIC PANEL - Abnormal; Notable for the following components:   Sodium 133 (*)    Chloride 87 (*)    Glucose, Bld 284 (*)    BUN 27 (*)    Calcium 11.8 (*)    Albumin 3.1 (*)    ALT 56 (*)    Alkaline Phosphatase 128 (*)    All other components within normal limits  MAGNESIUM - Abnormal; Notable for the following components:   Magnesium 1.1 (*)    All other components within normal limits  POC OCCULT BLOOD, ED    EKG None  Radiology DG ABD ACUTE 2+V W 1V CHEST  Result Date: 09/07/2020 CLINICAL DATA:  Abdominal distension, emesis and lower extremity swelling EXAM: DG ABDOMEN ACUTE WITH 1 VIEW CHEST  COMPARISON:  CT 08/24/2020, MR 06/25/2020, CT 06/19/2020 FINDINGS: There is extensive severe and increasing air distention of what appears to be the colon given some haustra markings although several distended loops of bowel in the low pelvis could reflect  some small bowel dilatation as well. A large volume of feculent material is noted within portions of the distended colon as well. No convincing evidence of subdiaphragmatic free air at this time. No suspicious abdominal calcifications. Lung volumes are markedly diminished. Some streaky and patchy opacities in the lung bases may reflect atelectasis, airspace disease or sequela of aspiration in the provided clinical context. Cardiomediastinal contours are unremarkable for the portable upright technique. No pneumothorax. No layering pleural effusion. Telemetry leads overlie the chest and abdomen with nasal cannula the base of the neck. Multilevel degenerative changes in the shoulders, spine, hips and pelvis with a prior left hip arthroplasty. IMPRESSION: Severe and increasing air distention of what appears to be the colon given some haustra markings although several distended loops of bowel in the low pelvis could reflect some small bowel dilatation as well. Large volume of feculent material within portions of the distended colon as well. Appearance could reflect a colonic obstruction or pseudo-obstruction versus ileus. Electronically Signed   By: Lovena Le M.D.   On: 09/07/2020 20:58    Procedures Procedures   Medications Ordered in ED Medications  sodium chloride 0.9 % bolus 1,000 mL (0 mLs Intravenous Stopped 09/07/20 2105)  ondansetron (ZOFRAN) injection 4 mg (4 mg Intravenous Given 09/07/20 2058)  pantoprazole (PROTONIX) injection 40 mg (40 mg Intravenous Given 09/07/20 2054)  sodium chloride 0.9 % bolus 1,000 mL (0 mLs Intravenous Stopped 09/07/20 2219)  iohexol (OMNIPAQUE) 300 MG/ML solution 100 mL (100 mLs Intravenous Contrast Given 09/07/20 2213)  ondansetron (ZOFRAN) injection 4 mg (4 mg Intravenous Given 09/07/20 2219)    ED Course  I have reviewed the triage vital signs and the nursing notes.  Pertinent labs & imaging results that were available during my care of the patient were reviewed by me  and considered in my medical decision making (see chart for details).    CRITICAL CARE Performed by: Milton Ferguson Total critical care time: 45 minutes Critical care time was exclusive of separately billable procedures and treating other patients. Critical care was necessary to treat or prevent imminent or life-threatening deterioration. Critical care was time spent personally by me on the following activities: development of treatment plan with patient and/or surrogate as well as nursing, discussions with consultants, evaluation of patient's response to treatment, examination of patient, obtaining history from patient or surrogate, ordering and performing treatments and interventions, ordering and review of laboratory studies, ordering and review of radiographic studies, pulse oximetry and re-evaluation of patient's condition.  MDM Rules/Calculators/A&P                         Patient with a DVT along with a duodenal mass with vomiting.  He will be placed on heparin we will get an NG tube he will have surgery and GI consulting tomorrow  Final Clinical Impression(s) / ED Diagnoses Final diagnoses:  None    Rx / DC Orders ED Discharge Orders    None       Milton Ferguson, MD 09/07/20 2248    Milton Ferguson, MD 09/07/20 2330

## 2020-09-07 NOTE — Progress Notes (Signed)
ANTICOAGULATION CONSULT NOTE - Initial Consult  Pharmacy Consult for Heparin Indication: RLE DVT  No Known Allergies  Patient Measurements: Height: 5\' 11"  (180.3 cm) Weight: 68 kg (149 lb 14.6 oz) IBW/kg (Calculated) : 75.3 Heparin Dosing Weight: 68 kg  Vital Signs: Temp: 98.7 F (37.1 C) (04/01 1943) Temp Source: Oral (04/01 1943) BP: 143/101 (04/01 2300) Pulse Rate: 116 (04/01 2300)  Labs: Recent Labs    09/07/20 2000  HGB 10.6*  HCT 32.5*  PLT 390  CREATININE 0.93    Estimated Creatinine Clearance: 74.1 mL/min (by C-G formula based on SCr of 0.93 mg/dL).   Medical History: Past Medical History:  Diagnosis Date  . Diabetes mellitus without complication ([place])   . High cholesterol   . Hypertension     Medications:  See electronic med rec  Assessment: 70 y.o. M presents with abd pain and RLE DVT. Noted h/o ulcers along with duodenal mass. Hgb 10.6, plt wnl. No AC PTA. To begin heparin per pharmacy.  Goal of Therapy:  Heparin level 0.3-0.7 units/ml Monitor platelets by anticoagulation protocol: Yes   Plan:  Heparin IV bolus 4000 units Heparin gtt at 1000 units/hr Will f/u heparin level in 6 hours Daily heparin level and CBC  Sherlon Handing, PharmD, BCPS Please see amion for complete clinical pharmacist phone list 09/07/2020,11:12 PM

## 2020-09-08 ENCOUNTER — Encounter (HOSPITAL_COMMUNITY): Payer: Self-pay | Admitting: Family Medicine

## 2020-09-08 ENCOUNTER — Inpatient Hospital Stay (HOSPITAL_COMMUNITY): Payer: Medicare Other

## 2020-09-08 DIAGNOSIS — Z7984 Long term (current) use of oral hypoglycemic drugs: Secondary | ICD-10-CM | POA: Diagnosis not present

## 2020-09-08 DIAGNOSIS — J690 Pneumonitis due to inhalation of food and vomit: Secondary | ICD-10-CM | POA: Diagnosis present

## 2020-09-08 DIAGNOSIS — Z993 Dependence on wheelchair: Secondary | ICD-10-CM | POA: Diagnosis not present

## 2020-09-08 DIAGNOSIS — K3189 Other diseases of stomach and duodenum: Secondary | ICD-10-CM | POA: Diagnosis not present

## 2020-09-08 DIAGNOSIS — E876 Hypokalemia: Secondary | ICD-10-CM | POA: Diagnosis not present

## 2020-09-08 DIAGNOSIS — K567 Ileus, unspecified: Secondary | ICD-10-CM | POA: Diagnosis not present

## 2020-09-08 DIAGNOSIS — D649 Anemia, unspecified: Secondary | ICD-10-CM | POA: Diagnosis present

## 2020-09-08 DIAGNOSIS — E785 Hyperlipidemia, unspecified: Secondary | ICD-10-CM | POA: Diagnosis present

## 2020-09-08 DIAGNOSIS — K5900 Constipation, unspecified: Secondary | ICD-10-CM

## 2020-09-08 DIAGNOSIS — Z515 Encounter for palliative care: Secondary | ICD-10-CM | POA: Diagnosis not present

## 2020-09-08 DIAGNOSIS — K5909 Other constipation: Secondary | ICD-10-CM | POA: Diagnosis not present

## 2020-09-08 DIAGNOSIS — Z794 Long term (current) use of insulin: Secondary | ICD-10-CM | POA: Diagnosis not present

## 2020-09-08 DIAGNOSIS — R140 Abdominal distension (gaseous): Secondary | ICD-10-CM | POA: Diagnosis not present

## 2020-09-08 DIAGNOSIS — O2230 Deep phlebothrombosis in pregnancy, unspecified trimester: Secondary | ICD-10-CM | POA: Diagnosis present

## 2020-09-08 DIAGNOSIS — I10 Essential (primary) hypertension: Secondary | ICD-10-CM | POA: Diagnosis present

## 2020-09-08 DIAGNOSIS — Z96642 Presence of left artificial hip joint: Secondary | ICD-10-CM | POA: Diagnosis present

## 2020-09-08 DIAGNOSIS — I82401 Acute embolism and thrombosis of unspecified deep veins of right lower extremity: Secondary | ICD-10-CM | POA: Diagnosis present

## 2020-09-08 DIAGNOSIS — E1165 Type 2 diabetes mellitus with hyperglycemia: Secondary | ICD-10-CM | POA: Diagnosis present

## 2020-09-08 DIAGNOSIS — Z79899 Other long term (current) drug therapy: Secondary | ICD-10-CM | POA: Diagnosis not present

## 2020-09-08 DIAGNOSIS — K56600 Partial intestinal obstruction, unspecified as to cause: Secondary | ICD-10-CM | POA: Diagnosis present

## 2020-09-08 DIAGNOSIS — K209 Esophagitis, unspecified without bleeding: Secondary | ICD-10-CM | POA: Diagnosis not present

## 2020-09-08 DIAGNOSIS — N400 Enlarged prostate without lower urinary tract symptoms: Secondary | ICD-10-CM | POA: Diagnosis present

## 2020-09-08 DIAGNOSIS — R64 Cachexia: Secondary | ICD-10-CM | POA: Diagnosis present

## 2020-09-08 DIAGNOSIS — E441 Mild protein-calorie malnutrition: Secondary | ICD-10-CM | POA: Diagnosis present

## 2020-09-08 DIAGNOSIS — G300 Alzheimer's disease with early onset: Secondary | ICD-10-CM | POA: Diagnosis not present

## 2020-09-08 DIAGNOSIS — F0390 Unspecified dementia without behavioral disturbance: Secondary | ICD-10-CM | POA: Diagnosis present

## 2020-09-08 DIAGNOSIS — R1909 Other intra-abdominal and pelvic swelling, mass and lump: Secondary | ICD-10-CM | POA: Diagnosis present

## 2020-09-08 DIAGNOSIS — Z7902 Long term (current) use of antithrombotics/antiplatelets: Secondary | ICD-10-CM | POA: Diagnosis not present

## 2020-09-08 DIAGNOSIS — Z681 Body mass index (BMI) 19 or less, adult: Secondary | ICD-10-CM | POA: Diagnosis not present

## 2020-09-08 DIAGNOSIS — Z20822 Contact with and (suspected) exposure to covid-19: Secondary | ICD-10-CM | POA: Diagnosis present

## 2020-09-08 DIAGNOSIS — E7800 Pure hypercholesterolemia, unspecified: Secondary | ICD-10-CM | POA: Diagnosis present

## 2020-09-08 DIAGNOSIS — Z7189 Other specified counseling: Secondary | ICD-10-CM | POA: Diagnosis not present

## 2020-09-08 DIAGNOSIS — R627 Adult failure to thrive: Secondary | ICD-10-CM | POA: Diagnosis not present

## 2020-09-08 LAB — COMPREHENSIVE METABOLIC PANEL WITH GFR
ALT: 41 U/L (ref 0–44)
AST: 21 U/L (ref 15–41)
Albumin: 2.6 g/dL — ABNORMAL LOW (ref 3.5–5.0)
Alkaline Phosphatase: 103 U/L (ref 38–126)
Anion gap: 12 (ref 5–15)
BUN: 27 mg/dL — ABNORMAL HIGH (ref 8–23)
CO2: 34 mmol/L — ABNORMAL HIGH (ref 22–32)
Calcium: 11.3 mg/dL — ABNORMAL HIGH (ref 8.9–10.3)
Chloride: 90 mmol/L — ABNORMAL LOW (ref 98–111)
Creatinine, Ser: 0.94 mg/dL (ref 0.61–1.24)
GFR, Estimated: >60 mL/min
Glucose, Bld: 199 mg/dL — ABNORMAL HIGH (ref 70–99)
Potassium: 3.9 mmol/L (ref 3.5–5.1)
Sodium: 136 mmol/L (ref 135–145)
Total Bilirubin: 0.5 mg/dL (ref 0.3–1.2)
Total Protein: 6.2 g/dL — ABNORMAL LOW (ref 6.5–8.1)

## 2020-09-08 LAB — CBC
HCT: 30.1 % — ABNORMAL LOW (ref 39.0–52.0)
Hemoglobin: 9.7 g/dL — ABNORMAL LOW (ref 13.0–17.0)
MCH: 27.4 pg (ref 26.0–34.0)
MCHC: 32.2 g/dL (ref 30.0–36.0)
MCV: 85 fL (ref 80.0–100.0)
Platelets: 339 10*3/uL (ref 150–400)
RBC: 3.54 MIL/uL — ABNORMAL LOW (ref 4.22–5.81)
RDW: 16.3 % — ABNORMAL HIGH (ref 11.5–15.5)
WBC: 11 10*3/uL — ABNORMAL HIGH (ref 4.0–10.5)
nRBC: 0 % (ref 0.0–0.2)

## 2020-09-08 LAB — RESP PANEL BY RT-PCR (FLU A&B, COVID) ARPGX2
Influenza A by PCR: NEGATIVE
Influenza B by PCR: NEGATIVE
SARS Coronavirus 2 by RT PCR: NEGATIVE

## 2020-09-08 LAB — MRSA PCR SCREENING: MRSA by PCR: NEGATIVE

## 2020-09-08 LAB — GLUCOSE, CAPILLARY
Glucose-Capillary: 114 mg/dL — ABNORMAL HIGH (ref 70–99)
Glucose-Capillary: 115 mg/dL — ABNORMAL HIGH (ref 70–99)
Glucose-Capillary: 121 mg/dL — ABNORMAL HIGH (ref 70–99)
Glucose-Capillary: 167 mg/dL — ABNORMAL HIGH (ref 70–99)
Glucose-Capillary: 178 mg/dL — ABNORMAL HIGH (ref 70–99)
Glucose-Capillary: 253 mg/dL — ABNORMAL HIGH (ref 70–99)

## 2020-09-08 LAB — HEMOGLOBIN A1C
Hgb A1c MFr Bld: 7.1 % — ABNORMAL HIGH (ref 4.8–5.6)
Mean Plasma Glucose: 157.07 mg/dL

## 2020-09-08 LAB — HEPARIN LEVEL (UNFRACTIONATED)
Heparin Unfractionated: 0.25 IU/mL — ABNORMAL LOW (ref 0.30–0.70)
Heparin Unfractionated: 0.4 IU/mL (ref 0.30–0.70)

## 2020-09-08 LAB — MAGNESIUM: Magnesium: 1.6 mg/dL — ABNORMAL LOW (ref 1.7–2.4)

## 2020-09-08 LAB — OCCULT BLOOD GASTRIC / DUODENUM (SPECIMEN CUP)
Occult Blood, Gastric: NEGATIVE
pH, Gastric: 4

## 2020-09-08 IMAGING — DX DG ABDOMEN 1V
1 series · 1 of 1 positions shown · non-contrast
Comparison: [DATE]

CLINICAL DATA: Nasogastric tube placement

EXAM:
ABDOMEN - 1 VIEW

[abdomen supine]
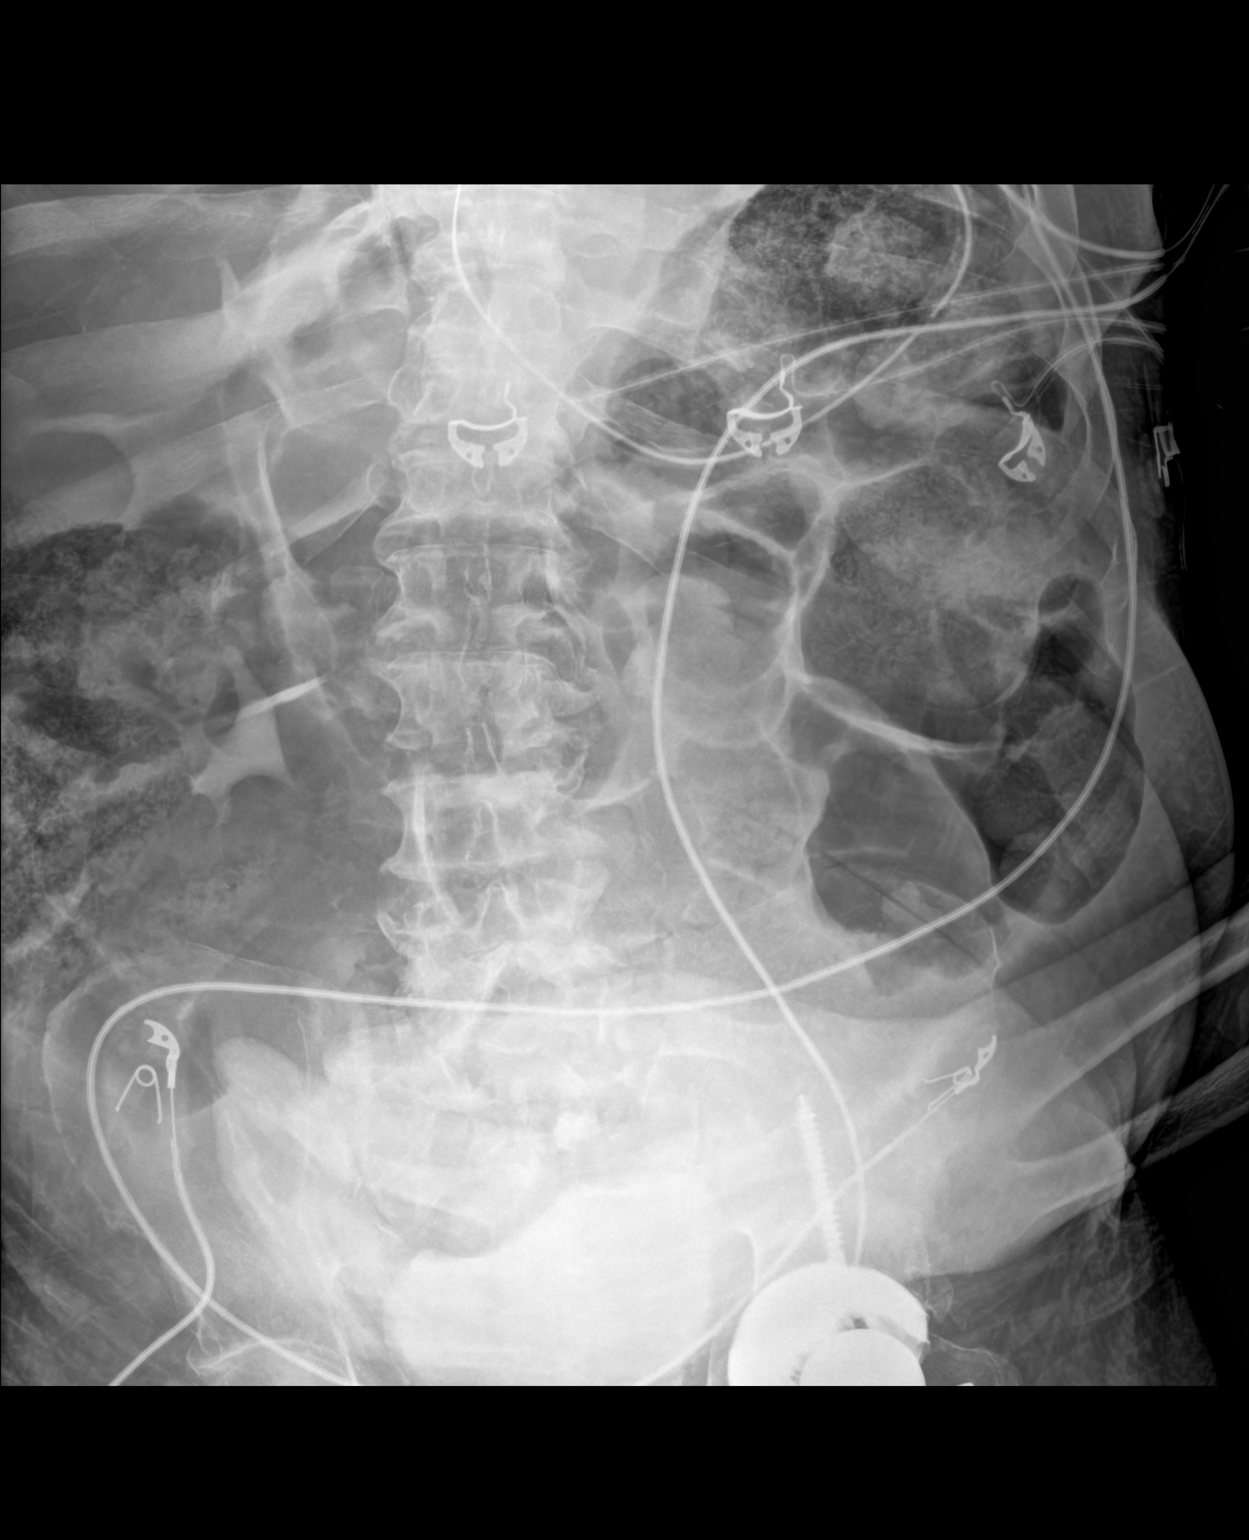

[1 of 1 positions shown; findings below may reference images not displayed]

FINDINGS: Nasogastric tube tip and side port project within the stomach.
Dilated and gas-filled bowel seen throughout the abdomen. Excreted
contrast in the urinary bladder and right renal collecting system.
IMPRESSION: Nasogastric tube tip and side port in the stomach.

## 2020-09-08 MED ORDER — METOPROLOL TARTRATE 5 MG/5ML IV SOLN
2.5000 mg | Freq: Four times a day (QID) | INTRAVENOUS | Status: DC
  Administered 2020-09-08: 2.5 mg via INTRAVENOUS
  Filled 2020-09-08: qty 5

## 2020-09-08 MED ORDER — FLEET ENEMA 7-19 GM/118ML RE ENEM
1.0000 | ENEMA | Freq: Three times a day (TID) | RECTAL | Status: DC
  Administered 2020-09-08 (×2): 1 via RECTAL

## 2020-09-08 MED ORDER — MAGNESIUM SULFATE 2 GM/50ML IV SOLN
2.0000 g | Freq: Once | INTRAVENOUS | Status: AC
  Administered 2020-09-08: 2 g via INTRAVENOUS
  Filled 2020-09-08: qty 50

## 2020-09-08 MED ORDER — SORBITOL 70 % SOLN
960.0000 mL | TOPICAL_OIL | Freq: Once | ORAL | Status: AC
  Administered 2020-09-08: 960 mL via RECTAL
  Filled 2020-09-08: qty 473

## 2020-09-08 MED ORDER — ACETAMINOPHEN 650 MG RE SUPP: 650.0000 mg | Freq: Four times a day (QID) | RECTAL | Status: DC | PRN

## 2020-09-08 MED ORDER — INSULIN ASPART 100 UNIT/ML ~~LOC~~ SOLN: 0.0000 [IU] | Freq: Every day | SUBCUTANEOUS | Status: DC

## 2020-09-08 MED ORDER — METOPROLOL TARTRATE 5 MG/5ML IV SOLN
5.0000 mg | Freq: Four times a day (QID) | INTRAVENOUS | Status: DC | PRN
  Administered 2020-09-08: 5 mg via INTRAVENOUS
  Filled 2020-09-08: qty 5

## 2020-09-08 MED ORDER — SODIUM CHLORIDE 0.9 % IV SOLN
3.0000 g | Freq: Three times a day (TID) | INTRAVENOUS | Status: DC
  Administered 2020-09-08 – 2020-09-12 (×13): 3 g via INTRAVENOUS
  Filled 2020-09-08 (×2): qty 8
  Filled 2020-09-08: qty 3
  Filled 2020-09-08 (×2): qty 8
  Filled 2020-09-08: qty 3
  Filled 2020-09-08 (×12): qty 8

## 2020-09-08 MED ORDER — METOPROLOL TARTRATE 5 MG/5ML IV SOLN
7.5000 mg | Freq: Four times a day (QID) | INTRAVENOUS | Status: DC
  Administered 2020-09-08 – 2020-09-12 (×16): 7.5 mg via INTRAVENOUS
  Filled 2020-09-08 (×17): qty 10

## 2020-09-08 MED ORDER — SODIUM CHLORIDE 0.9 % IV SOLN: INTRAVENOUS | Status: DC

## 2020-09-08 MED ORDER — METOPROLOL TARTRATE 5 MG/5ML IV SOLN
5.0000 mg | Freq: Four times a day (QID) | INTRAVENOUS | Status: DC
  Administered 2020-09-08: 5 mg via INTRAVENOUS
  Filled 2020-09-08: qty 5

## 2020-09-08 MED ORDER — ONDANSETRON HCL 4 MG PO TABS: 4.0000 mg | ORAL_TABLET | Freq: Four times a day (QID) | ORAL | Status: DC | PRN

## 2020-09-08 MED ORDER — MORPHINE SULFATE (PF) 2 MG/ML IV SOLN: 2.0000 mg | INTRAVENOUS | Status: DC | PRN

## 2020-09-08 MED ORDER — ONDANSETRON HCL 4 MG/2ML IJ SOLN: 4.0000 mg | Freq: Four times a day (QID) | INTRAMUSCULAR | Status: DC | PRN

## 2020-09-08 MED ORDER — ACETAMINOPHEN 325 MG PO TABS: 650.0000 mg | ORAL_TABLET | Freq: Four times a day (QID) | ORAL | Status: DC | PRN

## 2020-09-08 MED ORDER — MORPHINE SULFATE (PF) 2 MG/ML IV SOLN
2.0000 mg | INTRAVENOUS | Status: DC | PRN
Start: 2020-09-08 — End: 2020-09-13

## 2020-09-08 MED ORDER — PANTOPRAZOLE SODIUM 40 MG IV SOLR
40.0000 mg | Freq: Two times a day (BID) | INTRAVENOUS | Status: DC
  Administered 2020-09-08 – 2020-09-12 (×9): 40 mg via INTRAVENOUS
  Filled 2020-09-08 (×9): qty 40

## 2020-09-08 MED ORDER — CHLORHEXIDINE GLUCONATE CLOTH 2 % EX PADS
6.0000 | MEDICATED_PAD | Freq: Every day | CUTANEOUS | Status: DC
  Administered 2020-09-08 – 2020-09-12 (×5): 6 via TOPICAL

## 2020-09-08 MED ORDER — INSULIN ASPART 100 UNIT/ML ~~LOC~~ SOLN: 0.0000 [IU] | Freq: Three times a day (TID) | SUBCUTANEOUS | Status: DC

## 2020-09-08 MED ORDER — HEPARIN BOLUS VIA INFUSION
1000.0000 [IU] | Freq: Once | INTRAVENOUS | Status: AC
  Administered 2020-09-08: 1000 [IU] via INTRAVENOUS
  Filled 2020-09-08: qty 1000

## 2020-09-08 MED ORDER — INSULIN ASPART 100 UNIT/ML ~~LOC~~ SOLN
0.0000 [IU] | SUBCUTANEOUS | Status: DC
  Administered 2020-09-08: 2 [IU] via SUBCUTANEOUS
  Administered 2020-09-08 (×2): 3 [IU] via SUBCUTANEOUS
  Administered 2020-09-09: 2 [IU] via SUBCUTANEOUS

## 2020-09-08 NOTE — Plan of Care (Signed)
  Problem: Health Behavior/Discharge Planning: Goal: Ability to manage health-related needs will improve 09/08/2020 0348 by Mauri Reading, RN Outcome: Progressing 09/08/2020 0346 by Mauri Reading, RN Outcome: Progressing

## 2020-09-08 NOTE — H&P (Signed)
TRH H&P    Patient Demographics:    Daniel [name], is a 70 y.o. male  MRN: [id number]  DOB - Sep 10, 1950  Admit Date - 09/07/2020  Referring MD/NP/PA: Roderic Palau  Outpatient Primary MD for the patient is Leonie Douglas, MD  Patient coming from: Home  Chief complaint- DVT   HPI:    Daniel [name]  is a 70 y.o. male,with history of HTN, HLD, and DMII presents to the ED with a chief complaint of DVT. Patient is a ward of the state from the [facility]. Unfortunately he is not able to provide much history. He answers most questions with "I don't know." From report, and chart review, it seems that an [place] of right lower extremity for DVT was done at the facility. The result revealed a DVT. Patient was then transported to the ED. Upon arrival - patient also had significant abdominal distention. Patient denied abdominal pain at arrival, and still denies abdominal pain now. Workup revealed a bowel obstruction. NG tube was placed, and within a few minutes had 1 L of dark brown/black output. Rectal occult blood test was negative. Gastric contents occult blood is pending. Patient is afebrile without leukocytosis. He has had a recent admission for GI bleed - hemoglobin is stable at 10.6. Chemistry panel reveals a hyperglycemia at 284, alk phos 128, albumin 3.1, AST/ALT 39/56. CT abdomen shows esophagitis, distended stomach indicative of functional bowel obstruction. It also reveals pneumonia vs aspiration, and absent left kidney.  EKG reveals sinus tach with a heart rate 124, QTc 464.  Given the DVT patient was started on heparin drip per pharmacy consult.  Dr. Constance Haw was consulted and advised NG tube and she will see in the morning.  GI was consulted and also reports they will see in a.m.  Patient was given 2 L of fluid, Zofran 4 mg, and Protonix.    Review of systems:    Unable to perform review of systems secondary to patient's  dementia.   Past History of the following :    Past Medical History:  Diagnosis Date  . Diabetes mellitus without complication ([place])   . High cholesterol   . Hypertension       Past Surgical History:  Procedure Laterality Date  . BIOPSY  06/21/2020   Procedure: BIOPSY;  Surgeon: Daneil Dolin, MD;  [facility]: [facility];  Service: Endoscopy;;  gastric  . ESOPHAGOGASTRODUODENOSCOPY (EGD) WITH PROPOFOL N/A 06/21/2020   Procedure: ESOPHAGOGASTRODUODENOSCOPY (EGD) WITH PROPOFOL;  Surgeon: Daneil Dolin, MD;  Location: [facility];  Service: Endoscopy;  Laterality: N/A;  . HIP SURGERY     left  . KIDNEY SURGERY        Social History:      Social History   Tobacco Use  . Smoking status: Never Smoker  . Smokeless tobacco: Never Used  Substance Use Topics  . Alcohol use: No       Family History :     Family History  Family history unknown: Yes   Unable to review family history secondary  to patient's dementia  Home Medications:   Prior to Admission medications   Medication Sig Start Date End Date Taking? Authorizing Provider  acetaminophen (TYLENOL) 325 MG tablet Take 2 tablets (650 mg total) by mouth every 6 (six) hours as needed for mild pain, fever or headache (or Fever >/= 101). 08/30/20   Emokpae, Courage, MD  aMILoride (MIDAMOR) 5 MG tablet Take 1 tablet (5 mg total) by mouth daily. 08/31/20   Roxan Hockey, MD  amitriptyline (ELAVIL) 10 MG tablet Take 10 mg by mouth at bedtime. 04/30/20   [provider]  atorvastatin (LIPITOR) 40 MG tablet Take 1 tablet (40 mg total) by mouth daily. 08/30/20 09/29/20  Roxan Hockey, MD  Cholecalciferol (VITAMIN D3) 2000 units capsule TAKE 1 CAPSULE BY MOUTH ONCE DAILY. **DO NOT CRUSH** Patient taking differently: Take 2,000 Units by mouth daily. 01/06/18   Fayrene Helper, MD  donepezil (ARICEPT) 10 MG tablet TAKE (1) TABLET BY MOUTH ONCE DAILY. Patient taking differently: Take 10 mg by mouth daily. 06/15/17    Raylene Everts, MD  dronabinol (MARINOL) 2.5 MG capsule Take 2.5 mg by mouth 2 (two) times daily before lunch and supper.    [provider]  feeding supplement (ENSURE ENLIVE / ENSURE PLUS) LIQD Take 237 mLs by mouth 2 (two) times daily between meals. 06/22/20   Manuella Ghazi, Pratik D, DO  FEROSUL 325 (65 Fe) MG tablet TAKE 1 TABLET BY MOUTH TWICE DAILY. Patient taking differently: Take 325 mg by mouth in the morning and at bedtime. 06/15/17   Raylene Everts, MD  folic acid (FOLVITE) 643 MCG tablet Take 800 mcg by mouth daily.    [provider]  gentian violet 1 % topical solution Apply 1 mL topically See admin instructions. Applied between toes daily Patient not taking: No sig reported    [provider]  insulin aspart (NOVOLOG) 100 UNIT/ML FlexPen Inject 0-10 Units into the skin 3 (three) times daily with meals. insulin aspart (novoLOG) injection 0-10 Units 0-10 Units Subcutaneous, 3 times daily with meals CBG < 70: Implement Hypoglycemia Standing Orders and refer to Hypoglycemia Standing Orders sidebar report  CBG 70 - 120: 0 unit CBG 121 - 150: 0 unit  CBG 151 - 200: 1 unit CBG 201 - 250: 2 units CBG 251 - 300: 4 units CBG 301 - 350: 6 units  CBG 351 - 400: 8 units  CBG > 400: 10 units 08/30/20   Emokpae, Courage, MD  magnesium oxide (MAG-OX) 400 MG tablet Take 2 tablets (800 mg total) by mouth 3 (three) times daily. 08/30/20   Roxan Hockey, MD  memantine (NAMENDA) 10 MG tablet Take 10 mg by mouth 2 (two) times daily. 06/07/20   [provider]  metFORMIN (GLUCOPHAGE) 1000 MG tablet Take 1 tablet (1,000 mg total) by mouth 2 (two) times daily with a meal. 06/08/20   Barton Dubois, MD  metoprolol tartrate (LOPRESSOR) 25 MG tablet Take 1 tablet (25 mg total) by mouth 2 (two) times daily. 08/30/20 09/29/20  Roxan Hockey, MD  ondansetron (ZOFRAN) 4 MG tablet Take 1 tablet (4 mg total) by mouth every 6 (six) hours as needed for nausea. 06/22/20   Manuella Ghazi, Pratik D, DO   oxybutynin (DITROPAN-XL) 5 MG 24 hr tablet Take 5 mg by mouth daily. 01/04/20   [provider]  OXYGEN Inhale 2 L into the lungs daily.    [provider]  pantoprazole (PROTONIX) 40 MG tablet Take 1 tablet (40 mg total) by  mouth daily. 08/30/20 11/28/20  Roxan Hockey, MD  sucralfate (CARAFATE) 1 g tablet Take 1 tablet (1 g total) by mouth 4 (four) times daily. 06/22/20 06/22/21  Manuella Ghazi, Pratik D, DO  tamsulosin (FLOMAX) 0.4 MG CAPS capsule TAKE 1 CAPSULE BY MOUTH AT BEDTIME. Patient taking differently: Take 0.4 mg by mouth at bedtime. 06/15/17   Raylene Everts, MD  traZODone (DESYREL) 100 MG tablet Take 100 mg by mouth at bedtime. 01/04/20   [provider]  vitamin B-12 (CYANOCOBALAMIN) 500 MCG tablet Take 1,000 mcg by mouth daily.    [provider]  ramipril (ALTACE) 10 MG capsule Take 2 capsules (20 mg total) by mouth daily. 06/08/20 06/16/20  Barton Dubois, MD     Allergies:    No Known Allergies   Physical Exam:   Vitals  Blood pressure (!) 145/92, pulse (!) 111, temperature 98.7 F (37.1 C), temperature source Oral, resp. rate (!) 25, height _0  (1.803 m), weight 68 kg, SpO2 96 %.  1.  General: Patient supine in bed head of bed elevated, NG tube in place with dark-colored output  2. Psychiatric: Patient is oriented to self and place, not to time, has difficulty answering simple questions   3. Neurologic: Face is symmetric, moves all 4 extremities voluntarily, follows commands, seems have difficulty with comprehension, answering "I do not know" to most questions but then if you re-ask the same question and answering yes.  Ask the same question again answer is no.  4. HEENMT:  Head is atraumatic, normocephalic, pupils reactive to light, neck  slightly cachectic, trachea midline, NG tube in place  5. Respiratory : Diminished breath sounds in the lower lung fields bilaterally, no wheezing, no rhonchi, no crackles, no clubbing, slightly  tachypneic  6. Cardiovascular : Heart rate is tachycardic, rhythm is regular, no murmurs rubs or gallops  7. Gastrointestinal:  Abdomen is distended, nontender to palpation, NG tube had to intermittent suction with dark-colored contents for output  8. Skin:  Skin is warm dry and intact without acute lesion on limited exam  9.Musculoskeletal:  Unilateral right lower extremity swelling, no calf tenderness, peripheral pulses palpated    Data Review:    CBC Recent Labs  Lab 09/07/20 2000  WBC 8.0  HGB 10.6*  HCT 32.5*  PLT 390  MCV 84.4  MCH 27.5  MCHC 32.6  RDW 16.4*  LYMPHSABS 0.7  MONOABS 0.4  EOSABS 0.0  BASOSABS 0.0   ------------------------------------------------------------------------------------------------------------------  Results for orders placed or performed during the hospital encounter of 09/07/20 (from the past 48 hour(s))  CBC with Differential/Platelet     Status: Abnormal   Collection Time: 09/07/20  8:00 PM  Result Value Ref Range   WBC 8.0 4.0 - 10.5 K/uL   RBC 3.85 (L) 4.22 - 5.81 MIL/uL   Hemoglobin 10.6 (L) 13.0 - 17.0 g/dL   HCT 32.5 (L) 39.0 - 52.0 %   MCV 84.4 80.0 - 100.0 fL   MCH 27.5 26.0 - 34.0 pg   MCHC 32.6 30.0 - 36.0 g/dL   RDW 16.4 (H) 11.5 - 15.5 %   Platelets 390 150 - 400 K/uL   nRBC 0.0 0.0 - 0.2 %   Neutrophils Relative % 85 %   Neutro Abs 6.9 1.7 - 7.7 K/uL   Lymphocytes Relative 9 %   Lymphs Abs 0.7 0.7 - 4.0 K/uL   Monocytes Relative 5 %   Monocytes Absolute 0.4 0.1 - 1.0 K/uL   Eosinophils Relative 0 %  Eosinophils Absolute 0.0 0.0 - 0.5 K/uL   Basophils Relative 0 %   Basophils Absolute 0.0 0.0 - 0.1 K/uL   Immature Granulocytes 1 %   Abs Immature Granulocytes 0.04 0.00 - 0.07 K/uL    Comment: Performed at [facility], [address], [place], [place] [address]  Comprehensive metabolic panel     Status: Abnormal   Collection Time: 09/07/20  8:00 PM  Result Value Ref Range   Sodium 133 (L) 135 - 145 mmol/L    Potassium 3.8 3.5 - 5.1 mmol/L   Chloride 87 (L) 98 - 111 mmol/L   CO2 31 22 - 32 mmol/L   Glucose, Bld 284 (H) 70 - 99 mg/dL    Comment: Glucose reference range applies only to samples taken after fasting for at least 8 hours.   BUN 27 (H) 8 - 23 mg/dL   Creatinine, Ser 0.93 0.61 - 1.24 mg/dL   Calcium 11.8 (H) 8.9 - 10.3 mg/dL   Total Protein 7.5 6.5 - 8.1 g/dL   Albumin 3.1 (L) 3.5 - 5.0 g/dL   AST 39 15 - 41 U/L   ALT 56 (H) 0 - 44 U/L   Alkaline Phosphatase 128 (H) 38 - 126 U/L   Total Bilirubin 0.5 0.3 - 1.2 mg/dL   GFR, Estimated >60 >60 mL/min    Comment: (NOTE) Calculated using the CKD-EPI Creatinine Equation (2021)    Anion gap 15 5 - 15    Comment: Performed at [facility], [address] [place], [place] [address]  Magnesium     Status: Abnormal   Collection Time: 09/07/20  8:00 PM  Result Value Ref Range   Magnesium 1.1 (L) 1.7 - 2.4 mg/dL    Comment: Performed at [facility], [address], [place], [place] [address]  POC occult blood, ED     Status: None   Collection Time: 09/07/20  9:59 PM  Result Value Ref Range   Fecal Occult Bld NEGATIVE NEGATIVE    Chemistries  Recent Labs  Lab 09/07/20 2000  NA 133*  K 3.8  CL 87*  CO2 31  GLUCOSE 284*  BUN 27*  CREATININE 0.93  CALCIUM 11.8*  MG 1.1*  AST 39  ALT 56*  ALKPHOS 128*  BILITOT 0.5   ------------------------------------------------------------------------------------------------------------------  ------------------------------------------------------------------------------------------------------------------ GFR: Estimated Creatinine Clearance: 74.1 mL/min (by C-G formula based on SCr of 0.93 mg/dL). Liver Function Tests: Recent Labs  Lab 09/07/20 2000  AST 39  ALT 56*  ALKPHOS 128*  BILITOT 0.5  PROT 7.5  ALBUMIN 3.1*   No results for input(s): LIPASE, AMYLASE in the last 168 hours. No results for input(s): AMMONIA in the last 168 hours. Coagulation Profile: No results for  input(s): INR, PROTIME in the last 168 hours. Cardiac Enzymes: No results for input(s): CKTOTAL, CKMB, CKMBINDEX, TROPONINI in the last 168 hours. BNP (last 3 results) No results for input(s): PROBNP in the last 8760 hours. HbA1C: No results for input(s): HGBA1C in the last 72 hours. CBG: No results for input(s): GLUCAP in the last 168 hours. Lipid Profile: No results for input(s): CHOL, HDL, LDLCALC, TRIG, CHOLHDL, LDLDIRECT in the last 72 hours. Thyroid Function Tests: No results for input(s): TSH, T4TOTAL, FREET4, T3FREE, THYROIDAB in the last 72 hours. Anemia Panel: No results for input(s): VITAMINB12, FOLATE, FERRITIN, TIBC, IRON, RETICCTPCT in the last 72 hours.  --------------------------------------------------------------------------------------------------------------- Urine analysis:    Component Value Date/Time   COLORURINE YELLOW 06/20/2020 0200   [place] 06/20/2020 0200  LABSPEC 1.030 06/20/2020 0200   PHURINE 5.0 06/20/2020 0200   GLUCOSEU >=500 (A) 06/20/2020 0200   HGBUR NEGATIVE 06/20/2020 0200   BILIRUBINUR NEGATIVE 06/20/2020 0200   KETONESUR 5 (A) 06/20/2020 0200   PROTEINUR NEGATIVE 06/20/2020 0200   NITRITE NEGATIVE 06/20/2020 0200   LEUKOCYTESUR NEGATIVE 06/20/2020 0200      Imaging Results:    CT ABDOMEN PELVIS W CONTRAST  Result Date: 09/07/2020 CLINICAL DATA:  Nausea and vomiting coffee ground emesis EXAM: CT ABDOMEN AND PELVIS WITH CONTRAST TECHNIQUE: Multidetector CT imaging of the abdomen and pelvis was performed using the standard protocol following bolus administration of intravenous contrast. CONTRAST:  160m OMNIPAQUE IOHEXOL 300 MG/ML  SOLN COMPARISON:  09/07/2020, CT 08/24/2020, CT 07/27/2020 FINDINGS: Lower chest: Lung bases demonstrate ground-glass density and patchy consolidation in the right lower lobe and posterior right lower lobe. No pleural effusion. Normal cardiac size. Marked circumferential wall thickening of the distal  esophagus. Hepatobiliary: No calcified gallstone. No focal hepatic abnormality or biliary dilatation Pancreas: Truncated appearance of pancreas.  No inflammatory change. Spleen: Normal in size without focal abnormality. Adrenals/Urinary Tract: Adrenal glands are normal. No definitive left renal tissue identified, question very diminutive atrophic left kidney, series 3, image number 41. Small cysts in the right kidney. Subcentimeter hypodense lesions too small to further characterize. The bladder is normal. Stomach/Bowel: Marked fluid distension of the stomach. Some fluid-filled nondistended small bowel in the pelvis. Large volume of stool in the colon. Marked air distension of colon in the right upper quadrant, this appears to be sigmoid colon. Slightly narrowed appearing sigmoid colon that traverses horizontally and extends to right upper quadrant, series 3 image number 55 through 65. Considerable stool distal to the loop of dilated sigmoid colon with large feces in the rectum. No acute bowel wall thickening. No corkscrew appearance of mesentery or vasculature in the region of sigmoid colon narrowing. Similar configuration of the colon on 07/27/2020 but more normal course of sigmoid colon on CT from January of 2022. Vascular/Lymphatic: Nonaneurysmal aorta. Aortic atherosclerosis. No suspicious nodes Reproductive: Prostate is unremarkable. 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 Left hip arthroplasty with artifact. IMPRESSION: 1. Interval marked circumferential wall thickening of the distal esophagus, probably due to esophagitis. Marked fluid distension of the stomach without evidence for small bowel obstruction, question functional obstruction. Consider NG tube decompression. 2. Generalized distension of the colon with large feces throughout. More focally dilated loop of bowel in the right  upper quadrant appears to represent dilated segment of sigmoid colon. Slightly aberrant course of sigmoid colon which appears slightly narrowed at the right lower quadrant with adjacent slightly narrowed segment of rectosigmoid colon, question some degree of colon obstruction, potentially from internal hernia. No corkscrew appearance to suggest a volvulus. Considerable stool within the rectosigmoid colon distal to this suggesting that obstruction may be partial. Note that a similar configuration of the colon was present on CT from 07/27/2020 3. Ground-glass density and patchy consolidation in the right lower lobe and posterior right upper lobe concerning for pneumonia or aspiration. 4. Absent left kidney Aortic Atherosclerosis (ICD10-I70.0). Electronically Signed   By: KDonavan FoilM.D.   On: 09/07/2020 22:57   DG ABD ACUTE 2+V W 1V CHEST  Result Date: 09/07/2020 CLINICAL DATA:  Abdominal distension, emesis and lower extremity swelling EXAM: DG ABDOMEN ACUTE WITH 1 VIEW CHEST COMPARISON:  CT 08/24/2020, MR  06/25/2020, CT 06/19/2020 FINDINGS: There is extensive severe and increasing air distention of what appears to be the colon given some haustra markings although several distended loops of bowel in the low pelvis could reflect some small bowel dilatation as well. A large volume of feculent material is noted within portions of the distended colon as well. No convincing evidence of subdiaphragmatic free air at this time. No suspicious abdominal calcifications. Lung volumes are markedly diminished. Some streaky and patchy opacities in the lung bases may reflect atelectasis, airspace disease or sequela of aspiration in the provided clinical context. Cardiomediastinal contours are unremarkable for the portable upright technique. No pneumothorax. No layering pleural effusion. Telemetry leads overlie the chest and abdomen with nasal cannula the base of the neck. Multilevel degenerative changes in the shoulders, spine,  hips and pelvis with a prior left hip arthroplasty. IMPRESSION: Severe and increasing air distention of what appears to be the colon given some haustra markings although several distended loops of bowel in the low pelvis could reflect some small bowel dilatation as well. Large volume of feculent material within portions of the distended colon as well. Appearance could reflect a colonic obstruction or pseudo-obstruction versus ileus. Electronically Signed   By: Lovena Le M.D.   On: 09/07/2020 20:58       Assessment & Plan:    Principal Problem:   Abdominal distention    1. Abdominal distention 1. Secondary to functional bowel obstruction as seen on CT 2. NG tube in place with low intermittent suction 3. General surgery consulted and plans to see patient in the a.m. 4. N.p.o. 5. Maintenance fluids 6. Continue to monitor 2. Aspiration pneumonia  1. As seen on CT scan 2. Patient is tachypneic-which could be related to the intra-abdominal pathology, but no leukocytosis or fever 3. Will cover with Unasyn for now 4. Unfortunately patient is unable to provide a history to correlate with this finding 5. Continue to monitor 3. DVT 1. Started on heparin drip 2. Closely monitor CBC 3. Continue to monitor 4. Hyperglycemia in the setting of diabetes mellitus type 2 1. Last hemoglobin A1c was 9.8 2. Update hemoglobin A1c 3. Sliding scale coverage 4. Currently n.p.o. 5. Hypomagnesemia 1. Replace and recheck 6. Mild protein calorie malnutrition 1. Albumin is 3.1 2. Patient currently n.p.o. but when he is able to tolerate p.o. restart his protein shake 7.    DVT Prophylaxis-Heparin- SCDs   AM Labs Ordered, also please review Full Orders  Family Communication: No family at bedside  Code Status: Full  Admission status: Inpatient :The appropriate admission status for this patient is INPATIENT. Inpatient status is judged to be reasonable and necessary in order to provide the required  intensity of service to ensure the patient's safety. The patient's presenting symptoms, physical exam findings, and initial radiographic and laboratory data in the context of their chronic comorbidities is felt to place them at high risk for further clinical deterioration. Furthermore, it is not anticipated that the patient will be medically stable for discharge from the hospital within 2 midnights of admission. The following factors support the admission status of inpatient.     The patient's presenting symptoms include abdominal bloating, unilateral leg swelling The worrisome physical exam findings include unilateral leg swelling, abdominal distention. The initial radiographic and laboratory data are worrisome because of functional bowel obstruction, esophagitis, aspiration pneumonia The chronic co-morbidities include diabetes mellitus type 2, hypertension, hyperlipidemia       * I certify that at the point of admission  it is my clinical judgment that the patient will require inpatient hospital care spanning beyond 2 midnights from the point of admission due to high intensity of service, high risk for further deterioration and high frequency of surveillance required.*  Time spent in minutes : Full   Anamaria Dusenbury B Zierle-Ghosh DO

## 2020-09-08 NOTE — Consult Note (Signed)
Daniel [name], M.D. Gastroenterology & Hepatology                                           Patient Name: Daniel [name] Account #: _0 @   MRN: [id number] Admission Date: 09/07/2020 Date of Evaluation:  09/08/2020 Time of Evaluation: 8:33 AM   Referring Physician: Irwin Brakeman, MD  Chief Complaint:   HPI:  Daniel [name] is a 70 y.o. male with past medical history of diabetes, hyperlipidemia, hypertension, cognitive impairment, history of duodenal mass and CVA, who was admitted to the hospital after being found to have a DVT at [facility].  The patient is a poor historian and the history is obtained from medical chart.  Based on the notes, he was found to have a DVT at the [facility] and he was sent to the ER for further evaluation.  It is unclear if the patient has presented any episodes of vomiting.  In the ER, the patient had normal blood pressure but was tachycardic up to 138 initially which improved to after getting IV fluids. CT abdomen and pelvis was performed which showed presence of thickening of the distal esophagus with fluid distention of the stomach without evidence of small bowel obstruction.  He also had large amount of stool throughout his colon with a more dilated loop of bowel in the right upper quadrant, no presence of any complete obstruction of the colon.  Decreased, the patient had NG tube placement with drainage of 1 L of dark brown contents.  No reports of melena or hematochezia are documented in the medical chart.  In the ED, his hemoglobin baseline 10.6, with normal white blood cell count 8000 and platelets of 3 9, CMP showed mildly elevated liver disease, AST was mildly elevated 39, but this was 128 and total bili was 0.5, normal electrolytes and renal function.  Notably, the patient was hospitalized at [facility] on 06/20/2020 after he had episodes of substernal chest pain.  Upon admission, the patient was found to have an increased lactic acid of 2.5  with a hemoglobin of 12.2 but subsequently decreased to 10.3.  Due to severity of the pain and drop in his hemoglobin gastroenterology was consulted.  EGD was performed which showed presence of a tubular esophagus with presence of severe esophagitis.  There was also presence of 4 gastric ulcers in the stomach with largest 2.5 cm.  These ulcers were biopsied and were negative for H. pylori or dysplasia.  Also was found to have gastric erosions.  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  Notably, the patient underwent a CT of the abdomen and pelvis with IV contrast that showed slight prominence of the proximal pancreatic duct up to 3 mm, there was also presence of colonic tortuosity and presence of stool, there was also presence of esophageal thickening.  Patient underwent MRCP that did not show any gross masses but the quality of the study was limited due to motion.  The patient was discharged from the hospital on oral iron supplementation and pantoprazole 40 mg twice a day, his Plavix was held until further recommendations were performed.  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  He was scheduled to undergo evaluation of his duodenal mass by Dr. Rush Landmark on 09/14/2020.  No colonoscopies are on file.  Patient is currently on heparin drip and she has been chronically taking Plavix.  Past Medical History: SEE CHRONIC ISSSUES: Past Medical History:  Diagnosis Date  . Diabetes mellitus without complication ([place])   . High cholesterol   . Hypertension    Past Surgical History:  Past Surgical History:  Procedure Laterality Date  . BIOPSY  06/21/2020   Procedure: BIOPSY;   Surgeon: Daneil Dolin, MD;  [facility]: [facility];  Service: Endoscopy;;  gastric  . ESOPHAGOGASTRODUODENOSCOPY (EGD) WITH PROPOFOL N/A 06/21/2020   Procedure: ESOPHAGOGASTRODUODENOSCOPY (EGD) WITH PROPOFOL;  Surgeon: Daneil Dolin, MD;  Location: [facility];  Service: Endoscopy;  Laterality: N/A;  . HIP SURGERY     left  . KIDNEY SURGERY     Family History:  Family History  Family history unknown: Yes   Social History:  Social History   Tobacco Use  . Smoking status: Never Smoker  . Smokeless tobacco: Never Used  Vaping Use  . Vaping Use: Never used  Substance Use Topics  . Alcohol use: No  . Drug use: No    Home Medications:  Prior to Admission medications   Medication Sig Start Date End Date Taking? Authorizing Provider  acetaminophen (TYLENOL) 325 MG tablet Take 2 tablets (650 mg total) by mouth every 6 (six) hours as needed for mild pain, fever or headache (or Fever >/= 101). 08/30/20   Emokpae, Courage, MD  aMILoride (MIDAMOR) 5 MG tablet Take 1 tablet (5 mg total) by mouth daily. 08/31/20   Roxan Hockey, MD  amitriptyline (ELAVIL) 10 MG tablet Take 10 mg by mouth at bedtime. 04/30/20   [provider]  atorvastatin (LIPITOR) 40 MG tablet Take 1 tablet (40 mg total) by mouth daily. 08/30/20 09/29/20  Roxan Hockey, MD  Cholecalciferol (VITAMIN D3) 2000 units capsule TAKE 1 CAPSULE BY MOUTH ONCE DAILY. **DO NOT CRUSH** Patient taking differently: Take 2,000 Units by mouth daily. 01/06/18   Fayrene Helper, MD  donepezil (ARICEPT) 10 MG tablet TAKE (1) TABLET BY MOUTH ONCE DAILY. Patient taking differently: Take 10 mg by mouth daily. 06/15/17   Raylene Everts, MD  dronabinol (MARINOL) 2.5 MG capsule Take 2.5 mg by mouth 2 (two) times daily before lunch and supper.    [provider]  feeding supplement (ENSURE ENLIVE / ENSURE PLUS) LIQD Take 237 mLs by mouth 2 (two) times daily between meals. 06/22/20   Manuella Ghazi, Pratik D, DO  FEROSUL 325 (65 Fe)  MG tablet TAKE 1 TABLET BY MOUTH TWICE DAILY. Patient taking differently: Take 325 mg by mouth in the morning and at bedtime. 06/15/17   Raylene Everts, MD  folic acid (FOLVITE) 704 MCG tablet Take 800 mcg by mouth daily.    [provider]  gentian violet 1 % topical solution Apply 1 mL topically See admin instructions. Applied between toes daily Patient not taking: No sig reported    [provider]  insulin aspart (NOVOLOG) 100 UNIT/ML FlexPen Inject 0-10 Units into the skin 3 (three) times daily with meals. insulin aspart (novoLOG) injection 0-10 Units 0-10 Units Subcutaneous, 3 times daily with meals CBG < 70: Implement Hypoglycemia Standing Orders and refer to Hypoglycemia Standing Orders sidebar report  CBG 70 - 120: 0 unit CBG  121 - 150: 0 unit  CBG 151 - 200: 1 unit CBG 201 - 250: 2 units CBG 251 - 300: 4 units CBG 301 - 350: 6 units  CBG 351 - 400: 8 units  CBG > 400: 10 units 08/30/20   Emokpae, Courage, MD  magnesium oxide (MAG-OX) 400 MG tablet Take 2 tablets (800 mg total) by mouth 3 (three) times daily. 08/30/20   Roxan Hockey, MD  memantine (NAMENDA) 10 MG tablet Take 10 mg by mouth 2 (two) times daily. 06/07/20   [provider]  metFORMIN (GLUCOPHAGE) 1000 MG tablet Take 1 tablet (1,000 mg total) by mouth 2 (two) times daily with a meal. 06/08/20   Barton Dubois, MD  metoprolol tartrate (LOPRESSOR) 25 MG tablet Take 1 tablet (25 mg total) by mouth 2 (two) times daily. 08/30/20 09/29/20  Roxan Hockey, MD  ondansetron (ZOFRAN) 4 MG tablet Take 1 tablet (4 mg total) by mouth every 6 (six) hours as needed for nausea. 06/22/20   Manuella Ghazi, Pratik D, DO  oxybutynin (DITROPAN-XL) 5 MG 24 hr tablet Take 5 mg by mouth daily. 01/04/20   [provider]  OXYGEN Inhale 2 L into the lungs daily.    [provider]  pantoprazole (PROTONIX) 40 MG tablet Take 1 tablet (40 mg total) by mouth daily. 08/30/20 11/28/20  Roxan Hockey, MD  sucralfate  (CARAFATE) 1 g tablet Take 1 tablet (1 g total) by mouth 4 (four) times daily. 06/22/20 06/22/21  Manuella Ghazi, Pratik D, DO  tamsulosin (FLOMAX) 0.4 MG CAPS capsule TAKE 1 CAPSULE BY MOUTH AT BEDTIME. Patient taking differently: Take 0.4 mg by mouth at bedtime. 06/15/17   Raylene Everts, MD  traZODone (DESYREL) 100 MG tablet Take 100 mg by mouth at bedtime. 01/04/20   [provider]  vitamin B-12 (CYANOCOBALAMIN) 500 MCG tablet Take 1,000 mcg by mouth daily.    [provider]  ramipril (ALTACE) 10 MG capsule Take 2 capsules (20 mg total) by mouth daily. 06/08/20 06/16/20  Barton Dubois, MD    Inpatient Medications:  Current Facility-Administered Medications:  .  0.9 %  sodium chloride infusion, , Intravenous, Continuous, Zierle-Ghosh, Asia B, DO, Last Rate: 75 mL/hr at 09/08/20 0339, New Bag at 09/08/20 0339 .  acetaminophen (TYLENOL) tablet 650 mg, 650 mg, Oral, Q6H PRN **OR** acetaminophen (TYLENOL) suppository 650 mg, 650 mg, Rectal, Q6H PRN, Zierle-Ghosh, Asia B, DO .  Ampicillin-Sulbactam (UNASYN) 3 g in sodium chloride 0.9 % 100 mL IVPB, 3 g, Intravenous, Q8H, Zierle-Ghosh, Asia B, DO, Last Rate: 200 mL/hr at 09/08/20 0523, 3 g at 09/08/20 0523 .  heparin ADULT infusion 100 units/mL (25000 units/229m), 1,100 Units/hr, Intravenous, Continuous, BLaren Everts RPH, Last Rate: 11 mL/hr at 09/08/20 0733, 1,100 Units/hr at 09/08/20 0733 .  insulin aspart (novoLOG) injection 0-15 Units, 0-15 Units, Subcutaneous, Q4H, Johnson, Clanford L, MD, 3 Units at 09/08/20 [phone].  magnesium sulfate IVPB 2 g 50 mL, 2 g, Intravenous, Once, Johnson, Clanford L, MD, Last Rate: 50 mL/hr at 09/08/20 0744, 2 g at 09/08/20 0744 .  metoprolol tartrate (LOPRESSOR) injection 2.5 mg, 2.5 mg, Intravenous, Q6H, Johnson, Clanford L, MD, 2.5 mg at 09/08/20 0744 .  morphine 2 MG/ML injection 2 mg, 2 mg, Intravenous, Q4H PRN, Johnson, Clanford L, MD .  ondansetron (ZOFRAN) tablet 4 mg, 4 mg, Oral, Q6H PRN **OR**  ondansetron (ZOFRAN) injection 4 mg, 4 mg, Intravenous, Q6H PRN, Zierle-Ghosh, Asia B, DO .  pantoprazole (PROTONIX) injection 40 mg, 40 mg,  Intravenous, Q12H, Johnson, Clanford L, MD Allergies: Patient has no known allergies.  Complete Review of Systems: GENERAL: negative for malaise, night sweats HEENT: No changes in hearing or vision, no nose bleeds or other nasal problems. NECK: Negative for lumps, goiter, pain and significant neck swelling RESPIRATORY: Negative for cough, wheezing CARDIOVASCULAR: Negative for chest pain, leg swelling, palpitations, orthopnea GI: SEE HPI MUSCULOSKELETAL: Negative for joint pain or swelling, back pain, and muscle pain. SKIN: Negative for lesions, rash PSYCH: Negative for sleep disturbance, mood disorder and recent psychosocial stressors. HEMATOLOGY Negative for prolonged bleeding, bruising easily, and swollen nodes. ENDOCRINE: Negative for cold or heat intolerance, polyuria, polydipsia and goiter. NEURO: negative for tremor, gait imbalance, syncope and seizures. The remainder of the review of systems is noncontributory.  Physical Exam: BP (!) 161/99   Pulse 100   Temp 99.5 F (37.5 C) (Axillary)   Resp 15   Ht 6' (1.829 m)   Wt 66 kg   SpO2 100%   BMI 19.73 kg/m  GENERAL: The patient is AO x3, in no acute distress. NG tube with dark drainage. HEENT: Head is normocephalic and atraumatic. EOMI are intact. Mouth is well hydrated and without lesions. NECK: Supple. No masses LUNGS: Clear to auscultation. No presence of rhonchi/wheezing/rales. Adequate chest expansion HEART: RRR, normal s1 and s2. ABDOMEN: Soft, nontender, no guarding, no peritoneal signs, and nondistended. BS +. No masses. EXTREMITIES: Without any cyanosis, clubbing, rash, lesions or edema. NEUROLOGIC: AOx3, no focal motor deficit. SKIN: no jaundice, no rashes  Laboratory Data CBC:     Component Value Date/Time   WBC 11.0 (H) 09/08/2020 0603   RBC 3.54 (L) 09/08/2020 0603    HGB 9.7 (L) 09/08/2020 0603   HCT 30.1 (L) 09/08/2020 0603   PLT 339 09/08/2020 0603   MCV 85.0 09/08/2020 0603   MCH 27.4 09/08/2020 0603   MCHC 32.2 09/08/2020 0603   RDW 16.3 (H) 09/08/2020 0603   LYMPHSABS 0.7 09/07/2020 2000   MONOABS 0.4 09/07/2020 2000   EOSABS 0.0 09/07/2020 2000   BASOSABS 0.0 09/07/2020 2000   COAG:  Lab Results  Component Value Date   INR 1.1 02/21/2020   INR 1.0 01/05/2020    BMP:  BMP Latest Ref Rng & Units 09/08/2020 09/07/2020 08/30/2020  Glucose 70 - 99 mg/dL 199(H) 284(H) 144(H)  BUN 8 - 23 mg/dL 27(H) 27(H) 14  Creatinine 0.61 - 1.24 mg/dL 0.94 0.93 0.89  Sodium 135 - 145 mmol/L 136 133(L) 135  Potassium 3.5 - 5.1 mmol/L 3.9 3.8 3.9  Chloride 98 - 111 mmol/L 90(L) 87(L) 97(L)  CO2 22 - 32 mmol/L 34(H) 31 28  Calcium 8.9 - 10.3 mg/dL 11.3(H) 11.8(H) 10.3    HEPATIC:  Hepatic Function Latest Ref Rng & Units 09/08/2020 09/07/2020 08/30/2020  Total Protein 6.5 - 8.1 g/dL 6.2(L) 7.5 -  Albumin 3.5 - 5.0 g/dL 2.6(L) 3.1(L) 2.3(L)  AST 15 - 41 U/L 21 39 -  ALT 0 - 44 U/L 41 56(H) -  Alk Phosphatase 38 - 126 U/L 103 128(H) -  Total Bilirubin 0.3 - 1.2 mg/dL 0.5 0.5 -    CARDIAC: No results found for: CKTOTAL, CKMB, CKMBINDEX, TROPONINI   Imaging: I personally reviewed and interpreted the available imaging.  Assessment & Plan: Daniel [name] is a 70 y.o. male with past medical history of diabetes, hyperlipidemia, hypertension, cognitive impairment, history of duodenal mass and CVA, who was admitted to the hospital after being found to have a DVT at [facility].  Was found to have distended stomach without any signs of acute obstruction of his bowels but could represent a partial obstruction leading to his symptoms.  Based on the endoscopic pictures from his previous EGD, I do not consider likely that the mass has grown so fast to cause any significant luminal narrowing that we will lead to this presentation.  Will be useful to have a tissue diagnosis and  a thorough inspection of his duodenal mass to remind the etiology of this and the potential for endoscopic resectability.  he is scheduled to see Dr. Rush Landmark next week to schedule this procedure.  I do not consider that this needs to be done acutely, especially in the setting of large amount of gastric contents retained which are currently being drained through NG tube.  Patient worked presented worsening anemia or any evidence of overt gastrointestinal bleeding, will consider doing this as inpatient as patient is on Plavix and most recently on heparin ggt.  For now, we will continue supportive measures with NG tube drainage of his gastrointestinal contents and he will benefit from management with enema/dismpaction but also starting Miralax once tolerating oral intake.  # Duodenal mass - Continue NG tube drainage - Once tolerating PO, start Miralax daily - Enemas TID - Trend H/H - Unless presenting any worsening anemia or any evidence of overt gastrointestinal bleeding, will defer endoscopic evaluation until seen by Dr, Rush Landmark - Appreciate gen surgery recs  Harvel Quale, MD Gastroenterology and Hepatology [facility]

## 2020-09-08 NOTE — Progress Notes (Signed)
ASSUMPTION OF CARE NOTE   09/08/2020 2:15 PM  Daniel [name] was seen and examined.  The H&P by the admitting provider, orders, imaging was reviewed.  PALLIATIVE MEDICINE CONSULTATION REQUESTED.  Please see new orders.  Will continue to follow.   Vitals:   09/08/20 0900 09/08/20 1115  BP: (!) 209/134   Pulse:    Resp: 20   Temp:  99.5 F (37.5 C)  SpO2:      Results for orders placed or performed during the hospital encounter of 09/07/20  Resp Panel by RT-PCR (Flu A&B, Covid) Nasopharyngeal Swab   Specimen: Nasopharyngeal Swab; Nasopharyngeal(NP) swabs in vial transport medium  Result Value Ref Range   SARS Coronavirus 2 by RT PCR NEGATIVE NEGATIVE   Influenza A by PCR NEGATIVE NEGATIVE   Influenza B by PCR NEGATIVE NEGATIVE  MRSA PCR Screening   Specimen: Nasal Mucosa; Nasopharyngeal  Result Value Ref Range   MRSA by PCR NEGATIVE NEGATIVE  CBC with Differential/Platelet  Result Value Ref Range   WBC 8.0 4.0 - 10.5 K/uL   RBC 3.85 (L) 4.22 - 5.81 MIL/uL   Hemoglobin 10.6 (L) 13.0 - 17.0 g/dL   HCT 32.5 (L) 39.0 - 52.0 %   MCV 84.4 80.0 - 100.0 fL   MCH 27.5 26.0 - 34.0 pg   MCHC 32.6 30.0 - 36.0 g/dL   RDW 16.4 (H) 11.5 - 15.5 %   Platelets 390 150 - 400 K/uL   nRBC 0.0 0.0 - 0.2 %   Neutrophils Relative % 85 %   Neutro Abs 6.9 1.7 - 7.7 K/uL   Lymphocytes Relative 9 %   Lymphs Abs 0.7 0.7 - 4.0 K/uL   Monocytes Relative 5 %   Monocytes Absolute 0.4 0.1 - 1.0 K/uL   Eosinophils Relative 0 %   Eosinophils Absolute 0.0 0.0 - 0.5 K/uL   Basophils Relative 0 %   Basophils Absolute 0.0 0.0 - 0.1 K/uL   Immature Granulocytes 1 %   Abs Immature Granulocytes 0.04 0.00 - 0.07 K/uL  Comprehensive metabolic panel  Result Value Ref Range   Sodium 133 (L) 135 - 145 mmol/L   Potassium 3.8 3.5 - 5.1 mmol/L   Chloride 87 (L) 98 - 111 mmol/L   CO2 31 22 - 32 mmol/L   Glucose, Bld 284 (H) 70 - 99 mg/dL   BUN 27 (H) 8 - 23 mg/dL   Creatinine, Ser 0.93 0.61 - 1.24 mg/dL    Calcium 11.8 (H) 8.9 - 10.3 mg/dL   Total Protein 7.5 6.5 - 8.1 g/dL   Albumin 3.1 (L) 3.5 - 5.0 g/dL   AST 39 15 - 41 U/L   ALT 56 (H) 0 - 44 U/L   Alkaline Phosphatase 128 (H) 38 - 126 U/L   Total Bilirubin 0.5 0.3 - 1.2 mg/dL   GFR, Estimated >60 >60 mL/min   Anion gap 15 5 - 15  Magnesium  Result Value Ref Range   Magnesium 1.1 (L) 1.7 - 2.4 mg/dL  Heparin level (unfractionated)  Result Value Ref Range   Heparin Unfractionated 0.25 (L) 0.30 - 0.70 IU/mL  Occult bld gastric/duodenum (cup to lab)  Result Value Ref Range   pH, Gastric 4    Occult Blood, Gastric NEGATIVE NEGATIVE  Magnesium  Result Value Ref Range   Magnesium 1.6 (L) 1.7 - 2.4 mg/dL  CBC  Result Value Ref Range   WBC 11.0 (H) 4.0 - 10.5 K/uL   RBC 3.54 (L) 4.22 - 5.81  MIL/uL   Hemoglobin 9.7 (L) 13.0 - 17.0 g/dL   HCT 30.1 (L) 39.0 - 52.0 %   MCV 85.0 80.0 - 100.0 fL   MCH 27.4 26.0 - 34.0 pg   MCHC 32.2 30.0 - 36.0 g/dL   RDW 16.3 (H) 11.5 - 15.5 %   Platelets 339 150 - 400 K/uL   nRBC 0.0 0.0 - 0.2 %  Glucose, capillary  Result Value Ref Range   Glucose-Capillary 253 (H) 70 - 99 mg/dL  Comprehensive metabolic panel  Result Value Ref Range   Sodium 136 135 - 145 mmol/L   Potassium 3.9 3.5 - 5.1 mmol/L   Chloride 90 (L) 98 - 111 mmol/L   CO2 34 (H) 22 - 32 mmol/L   Glucose, Bld 199 (H) 70 - 99 mg/dL   BUN 27 (H) 8 - 23 mg/dL   Creatinine, Ser 0.94 0.61 - 1.24 mg/dL   Calcium 11.3 (H) 8.9 - 10.3 mg/dL   Total Protein 6.2 (L) 6.5 - 8.1 g/dL   Albumin 2.6 (L) 3.5 - 5.0 g/dL   AST 21 15 - 41 U/L   ALT 41 0 - 44 U/L   Alkaline Phosphatase 103 38 - 126 U/L   Total Bilirubin 0.5 0.3 - 1.2 mg/dL   GFR, Estimated >60 >60 mL/min   Anion gap 12 5 - 15  Glucose, capillary  Result Value Ref Range   Glucose-Capillary 178 (H) 70 - 99 mg/dL  Glucose, capillary  Result Value Ref Range   Glucose-Capillary 121 (H) 70 - 99 mg/dL  POC occult blood, ED  Result Value Ref Range   Fecal Occult Bld NEGATIVE  NEGATIVE   Murvin Natal, MD [facility]   09/07/2020  7:36 PM How to contact the [facility] Attending or Consulting provider [place] or covering provider during after hours 7P -7A, for this patient?  1. Check the care team in [facility] and look for a) attending/consulting [facility] provider listed and b) the [facility] team listed 2. Log into [email or web address] and use 's universal password to access. If you do not have the password, please contact the hospital operator. 3. Locate the [facility] provider you are looking for under [facility] and page to a number that you can be directly reached. 4. If you still have difficulty reaching the provider, please page the [facility] (Director on Call) for the Hospitalists listed on amion for assistance.

## 2020-09-08 NOTE — Progress Notes (Signed)
ANTICOAGULATION CONSULT NOTE -   Pharmacy Consult for Heparin Indication: RLE DVT  No Known Allergies  Patient Measurements: Height: 6' (182.9 cm) Weight: 66 kg (145 lb 8.1 oz) IBW/kg (Calculated) : 77.6 Heparin Dosing Weight: 68 kg  Vital Signs: Temp: 99.3 F (37.4 C) (04/02 1434) Temp Source: Axillary (04/02 1434) BP: 150/95 (04/02 1434) Pulse Rate: 106 (04/02 1434)  Labs: Recent Labs    09/07/20 2000 09/08/20 0603 09/08/20 1331  HGB 10.6* 9.7*  --   HCT 32.5* 30.1*  --   PLT 390 339  --   HEPARINUNFRC  --  0.25* 0.40  CREATININE 0.93 0.94  --     Estimated Creatinine Clearance: 71.2 mL/min (by C-G formula based on SCr of 0.94 mg/dL).   Medical History: Past Medical History:  Diagnosis Date  . Diabetes mellitus without complication ([place])   . High cholesterol   . Hypertension     Medications:  See electronic med rec  Assessment: 70 y.o. M presents with abd pain and RLE DVT. Noted h/o ulcers along with duodenal mass. Hgb 10.6, plt wnl. No AC PTA. To begin heparin per pharmacy.  HL 0.4 is therapeutic, no issues with infusion  Goal of Therapy:  Heparin level 0.3-0.7 units/ml Monitor platelets by anticoagulation protocol: Yes   Plan:  Continue Heparin gtt at 11000 units/hr Will f/u heparin level in ~ 6 hours Daily heparin level and CBC  Isac Sarna, BS [place], BCPS Clinical Pharmacist Pager [phone] 09/08/2020,2:50 PM

## 2020-09-08 NOTE — Consult Note (Signed)
[facility] Consult  Reason for Consult: Bowel obstruction ? Internal hernia  Referring Physician:  Dr. Roderic Palau, Dr. Wynetta Emery   Chief Complaint    Medical Clearance      HPI: Daniel [name] is a 70 y.o. 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 He presents with findings of a DVT. He has a legal guardian from the state and comes from the [place] [facility]. An US done at the facility found the DVT.  He was then transported to the ED and found to have a very distended abdomen and pain and reported to have some vomiting.    Dr. Roderic Palau contacted me last night to review the CT which demonstrated the gastric distention and significant stool burden with mention of possible internal hernia in the read but nothing conclusive. He was admitted with NG and heparin gtt. Dr. Wynetta Emery is waiting to speak with the family and the legal guardian. Dr. Jenetta Downer is consulted also regarding plans for biopsy that was planned as an outpatient on the mass.   Past Medical History:  Diagnosis Date  . Diabetes mellitus without complication ([place])   . High cholesterol   . Hypertension     Past Surgical History:  Procedure Laterality Date  . BIOPSY  06/21/2020   Procedure: BIOPSY;  Surgeon: Daneil Dolin, MD;  Location: [facility];  Service: Endoscopy;;  gastric  . ESOPHAGOGASTRODUODENOSCOPY (EGD) WITH PROPOFOL N/A 06/21/2020   Procedure: ESOPHAGOGASTRODUODENOSCOPY (EGD) WITH PROPOFOL;  Surgeon: Daneil Dolin, MD;  Location: [facility];  Service: Endoscopy;  Laterality: N/A;  . HIP SURGERY     left  . KIDNEY SURGERY      Family History  Family history unknown: Yes    Social History   Tobacco Use  . Smoking status: Never Smoker  . Smokeless tobacco: Never Used  Vaping Use  . Vaping Use: Never used  Substance Use Topics   . Alcohol use: No  . Drug use: No    Medications:  I have reviewed the patient's current medications. Prior to Admission:  Medications Prior to Admission  Medication Sig Dispense Refill Last Dose  . acetaminophen (TYLENOL) 325 MG tablet Take 2 tablets (650 mg total) by mouth every 6 (six) hours as needed for mild pain, fever or headache (or Fever >/= 101). 12 tablet 0 09/07/2020 at Unknown time  . aMILoride (MIDAMOR) 5 MG tablet Take 1 tablet (5 mg total) by mouth daily. 30 tablet 5 09/07/2020 at Unknown time  . amitriptyline (ELAVIL) 10 MG tablet Take 10 mg by mouth at bedtime.   09/07/2020 at Unknown time  . atorvastatin (LIPITOR) 40 MG tablet Take 1 tablet (40 mg total) by mouth daily. 30 tablet 4 09/07/2020 at Unknown time  . Cholecalciferol (VITAMIN D3) 2000 units capsule TAKE 1 CAPSULE BY MOUTH ONCE DAILY. **DO NOT CRUSH** (Patient taking differently: Take 2,000 Units by mouth daily.) 30 capsule 5 09/07/2020 at Unknown time  . donepezil (ARICEPT) 10 MG tablet TAKE (1) TABLET BY MOUTH ONCE DAILY. (Patient taking differently: Take 10 mg by mouth daily.) 30 tablet PRN 09/07/2020 at Unknown time  . dronabinol (MARINOL) 2.5 MG capsule Take 2.5 mg by mouth 2 (two) times daily before lunch and supper.   09/07/2020 at Unknown time  . FEROSUL 325 (65 Fe) MG tablet TAKE 1  TABLET BY MOUTH TWICE DAILY. (Patient taking differently: Take 325 mg by mouth in the morning and at bedtime.) 180 tablet 3 09/07/2020 at Unknown time  . folic acid (FOLVITE) 295 MCG tablet Take 800 mcg by mouth daily.   09/07/2020 at Unknown time  . insulin aspart (NOVOLOG) 100 UNIT/ML FlexPen Inject 0-10 Units into the skin 3 (three) times daily with meals. insulin aspart (novoLOG) injection 0-10 Units 0-10 Units Subcutaneous, 3 times daily with meals CBG < 70: Implement Hypoglycemia Standing Orders and refer to Hypoglycemia Standing Orders sidebar report  CBG 70 - 120: 0 unit CBG 121 - 150: 0 unit  CBG 151 - 200: 1 unit CBG 201 - 250: 2 units CBG  251 - 300: 4 units CBG 301 - 350: 6 units  CBG 351 - 400: 8 units  CBG > 400: 10 units 15 mL 11 09/07/2020 at Unknown time  . magnesium oxide (MAG-OX) 400 MG tablet Take 2 tablets (800 mg total) by mouth 3 (three) times daily. 180 tablet 5 09/07/2020 at Unknown time  . memantine (NAMENDA) 10 MG tablet Take 10 mg by mouth 2 (two) times daily.   09/07/2020 at Unknown time  . metFORMIN (GLUCOPHAGE) 1000 MG tablet Take 1 tablet (1,000 mg total) by mouth 2 (two) times daily with a meal. 60 tablet 3 09/07/2020 at Unknown time  . metoprolol tartrate (LOPRESSOR) 25 MG tablet Take 1 tablet (25 mg total) by mouth 2 (two) times daily. 60 tablet 3 09/07/2020 at 2000  . ondansetron (ZOFRAN) 4 MG tablet Take 1 tablet (4 mg total) by mouth every 6 (six) hours as needed for nausea. 20 tablet 0 09/07/2020 at Unknown time  . oxybutynin (DITROPAN-XL) 5 MG 24 hr tablet Take 5 mg by mouth daily.   09/07/2020 at Unknown time  . OXYGEN Inhale 2 L into the lungs daily.     . pantoprazole (PROTONIX) 40 MG tablet Take 1 tablet (40 mg total) by mouth daily. 90 tablet 3 09/07/2020 at Unknown time  . sucralfate (CARAFATE) 1 g tablet Take 1 tablet (1 g total) by mouth 4 (four) times daily. 120 tablet 1 09/07/2020 at Unknown time  . tamsulosin (FLOMAX) 0.4 MG CAPS capsule TAKE 1 CAPSULE BY MOUTH AT BEDTIME. (Patient taking differently: Take 0.4 mg by mouth at bedtime.) 90 capsule 1 09/07/2020 at Unknown time  . traZODone (DESYREL) 100 MG tablet Take 100 mg by mouth at bedtime.   09/07/2020 at Unknown time  . vitamin B-12 (CYANOCOBALAMIN) 500 MCG tablet Take 1,000 mcg by mouth daily.   09/07/2020 at Unknown time  . feeding supplement (ENSURE ENLIVE / ENSURE PLUS) LIQD Take 237 mLs by mouth 2 (two) times daily between meals. 237 mL 12    Scheduled: . Chlorhexidine Gluconate Cloth  6 each Topical Daily  . insulin aspart  0-15 Units Subcutaneous Q4H  . metoprolol tartrate  5 mg Intravenous Q6H  . pantoprazole (PROTONIX) IV  40 mg Intravenous Q12H    No  Known Allergies   ROS:  Review of systems not obtained due to patient factors.  Blood pressure (!) 209/134, pulse 100, temperature 99.5 F (37.5 C), temperature source Axillary, resp. rate 20, height 6' (1.829 m), weight 66 kg, SpO2 100 %. Physical Exam Vitals reviewed.  Constitutional:      General: He is not in acute distress. HENT:     Head: Normocephalic.  Eyes:     Comments: Eyes open  Cardiovascular:     Rate and Rhythm: Normal  rate.  Pulmonary:     Effort: Pulmonary effort is normal.  Abdominal:     General: There is distension.     Palpations: Abdomen is soft.     Tenderness: There is no abdominal tenderness.  Musculoskeletal:     Right lower leg: Edema present.  Skin:    General: Skin is warm.  Neurological:     Mental Status: Mental status is at baseline.     Results: Results for orders placed or performed during the hospital encounter of 09/07/20 (from the past 48 hour(s))  CBC with Differential/Platelet     Status: Abnormal   Collection Time: 09/07/20  8:00 PM  Result Value Ref Range   WBC 8.0 4.0 - 10.5 K/uL   RBC 3.85 (L) 4.22 - 5.81 MIL/uL   Hemoglobin 10.6 (L) 13.0 - 17.0 g/dL   HCT 32.5 (L) 39.0 - 52.0 %   MCV 84.4 80.0 - 100.0 fL   MCH 27.5 26.0 - 34.0 pg   MCHC 32.6 30.0 - 36.0 g/dL   RDW 16.4 (H) 11.5 - 15.5 %   Platelets 390 150 - 400 K/uL   nRBC 0.0 0.0 - 0.2 %   Neutrophils Relative % 85 %   Neutro Abs 6.9 1.7 - 7.7 K/uL   Lymphocytes Relative 9 %   Lymphs Abs 0.7 0.7 - 4.0 K/uL   Monocytes Relative 5 %   Monocytes Absolute 0.4 0.1 - 1.0 K/uL   Eosinophils Relative 0 %   Eosinophils Absolute 0.0 0.0 - 0.5 K/uL   Basophils Relative 0 %   Basophils Absolute 0.0 0.0 - 0.1 K/uL   Immature Granulocytes 1 %   Abs Immature Granulocytes 0.04 0.00 - 0.07 K/uL    Comment: Performed at [facility], [address], [place], [place] [address]  Comprehensive metabolic panel     Status: Abnormal   Collection Time: 09/07/20  8:00 PM  Result Value  Ref Range   Sodium 133 (L) 135 - 145 mmol/L   Potassium 3.8 3.5 - 5.1 mmol/L   Chloride 87 (L) 98 - 111 mmol/L   CO2 31 22 - 32 mmol/L   Glucose, Bld 284 (H) 70 - 99 mg/dL    Comment: Glucose reference range applies only to samples taken after fasting for at least 8 hours.   BUN 27 (H) 8 - 23 mg/dL   Creatinine, Ser 0.93 0.61 - 1.24 mg/dL   Calcium 11.8 (H) 8.9 - 10.3 mg/dL   Total Protein 7.5 6.5 - 8.1 g/dL   Albumin 3.1 (L) 3.5 - 5.0 g/dL   AST 39 15 - 41 U/L   ALT 56 (H) 0 - 44 U/L   Alkaline Phosphatase 128 (H) 38 - 126 U/L   Total Bilirubin 0.5 0.3 - 1.2 mg/dL   GFR, Estimated >60 >60 mL/min    Comment: (NOTE) Calculated using the CKD-EPI Creatinine Equation (2021)    Anion gap 15 5 - 15    Comment: Performed at [facility], [address] [place], [place] [address]  Magnesium     Status: Abnormal   Collection Time: 09/07/20  8:00 PM  Result Value Ref Range   Magnesium 1.1 (L) 1.7 - 2.4 mg/dL    Comment: Performed at [facility], [address] [place], [place] [address]  POC occult blood, ED     Status: None   Collection Time: 09/07/20  9:59 PM  Result Value Ref Range   Fecal Occult Bld NEGATIVE NEGATIVE  Resp Panel by  RT-PCR (Flu A&B, Covid) Nasopharyngeal Swab     Status: None   Collection Time: 09/08/20  1:10 AM   Specimen: Nasopharyngeal Swab; Nasopharyngeal(NP) swabs in vial transport medium  Result Value Ref Range   SARS Coronavirus 2 by RT PCR NEGATIVE NEGATIVE    Comment: (NOTE) SARS-CoV-2 target nucleic acids are NOT DETECTED.  The SARS-CoV-2 RNA is generally detectable in upper respiratory specimens during the acute phase of infection. The lowest concentration of SARS-CoV-2 viral copies this assay can detect is 138 copies/mL. A negative result does not preclude SARS-Cov-2 infection and should not be used as the sole basis for treatment or other patient management decisions. A negative result may occur with  improper specimen collection/handling,  submission of specimen other than nasopharyngeal swab, presence of viral mutation(s) within the areas targeted by this assay, and inadequate number of viral copies(<138 copies/mL). A negative result must be combined with clinical observations, patient history, and epidemiological information. The expected result is Negative.  Fact Sheet for Patients:  EntrepreneurPulse.com.au  Fact Sheet for Healthcare Providers:  IncredibleEmployment.be  This test is no t yet approved or cleared by the [place] FDA and  has been authorized for detection and/or diagnosis of SARS-CoV-2 by FDA under an Emergency Use Authorization (EUA). This EUA will remain  in effect (meaning this test can be used) for the duration of the COVID-19 declaration under Section 564(b)(1) of the Act, 21 U.S.C.section 360bbb-3(b)(1), unless the authorization is terminated  or revoked sooner.       Influenza A by PCR NEGATIVE NEGATIVE   Influenza B by PCR NEGATIVE NEGATIVE    Comment: (NOTE) The Xpert Xpress SARS-CoV-2/FLU/RSV plus assay is intended as an aid in the diagnosis of influenza from Nasopharyngeal swab specimens and should not be used as a sole basis for treatment. Nasal washings and aspirates are unacceptable for Xpert Xpress SARS-CoV-2/FLU/RSV testing.  Fact Sheet for Patients: EntrepreneurPulse.com.au  Fact Sheet for Healthcare Providers: IncredibleEmployment.be  This test is not yet approved or cleared by the [place] FDA and has been authorized for detection and/or diagnosis of SARS-CoV-2 by FDA under an Emergency Use Authorization (EUA). This EUA will remain in effect (meaning this test can be used) for the duration of the COVID-19 declaration under Section 564(b)(1) of the Act, 21 U.S.C. section 360bbb-3(b)(1), unless the authorization is terminated or revoked.  Performed at [facility], [address],  [place], [place] [address]   Occult bld gastric/duodenum (cup to lab)     Status: None   Collection Time: 09/08/20  1:10 AM  Result Value Ref Range   pH, Gastric 4    Occult Blood, Gastric NEGATIVE NEGATIVE    Comment: Performed at [facility], [address], [place], [place] [address]  MRSA PCR Screening     Status: None   Collection Time: 09/08/20  2:26 AM   Specimen: Nasal Mucosa; Nasopharyngeal  Result Value Ref Range   MRSA by PCR NEGATIVE NEGATIVE    Comment:        The GeneXpert MRSA Assay (FDA approved for NASAL specimens only), is one component of a comprehensive MRSA colonization surveillance program. It is not intended to diagnose MRSA infection nor to guide or monitor treatment for MRSA infections. Performed at [facility], [address], [place], [place] [address]   Glucose, capillary     Status: Abnormal   Collection Time: 09/08/20  2:45 AM  Result Value Ref Range   Glucose-Capillary 253 (H) 70 - 99 mg/dL  Comment: Glucose reference range applies only to samples taken after fasting for at least 8 hours.  Heparin level (unfractionated)     Status: Abnormal   Collection Time: 09/08/20  6:03 AM  Result Value Ref Range   Heparin Unfractionated 0.25 (L) 0.30 - 0.70 IU/mL    Comment: (NOTE) If heparin results are below expected values, and patient dosage has  been confirmed, suggest follow up testing of antithrombin III levels. Performed at [facility], [address] [place], [place] [address]   Magnesium     Status: Abnormal   Collection Time: 09/08/20  6:03 AM  Result Value Ref Range   Magnesium 1.6 (L) 1.7 - 2.4 mg/dL    Comment: Performed at [facility] [address] [place], [place] [address]  CBC     Status: Abnormal   Collection Time: 09/08/20  6:03 AM  Result Value Ref Range   WBC 11.0 (H) 4.0 - 10.5 K/uL   RBC 3.54 (L) 4.22 - 5.81 MIL/uL   Hemoglobin 9.7 (L) 13.0 - 17.0 g/dL   HCT 30.1 (L) 39.0 - 52.0 %   MCV 85.0 80.0 - 100.0 fL   MCH 27.4 26.0  - 34.0 pg   MCHC 32.2 30.0 - 36.0 g/dL   RDW 16.3 (H) 11.5 - 15.5 %   Platelets 339 150 - 400 K/uL   nRBC 0.0 0.0 - 0.2 %    Comment: Performed at [facility], [address], [place], [place] [address]  Comprehensive metabolic panel     Status: Abnormal   Collection Time: 09/08/20  6:03 AM  Result Value Ref Range   Sodium 136 135 - 145 mmol/L   Potassium 3.9 3.5 - 5.1 mmol/L   Chloride 90 (L) 98 - 111 mmol/L   CO2 34 (H) 22 - 32 mmol/L   Glucose, Bld 199 (H) 70 - 99 mg/dL    Comment: Glucose reference range applies only to samples taken after fasting for at least 8 hours.   BUN 27 (H) 8 - 23 mg/dL   Creatinine, Ser 0.94 0.61 - 1.24 mg/dL   Calcium 11.3 (H) 8.9 - 10.3 mg/dL   Total Protein 6.2 (L) 6.5 - 8.1 g/dL   Albumin 2.6 (L) 3.5 - 5.0 g/dL   AST 21 15 - 41 U/L   ALT 41 0 - 44 U/L   Alkaline Phosphatase 103 38 - 126 U/L   Total Bilirubin 0.5 0.3 - 1.2 mg/dL   GFR, Estimated >60 >60 mL/min    Comment: (NOTE) Calculated using the CKD-EPI Creatinine Equation (2021)    Anion gap 12 5 - 15    Comment: Performed at [facility], [address] [place], [place] [address]  Glucose, capillary     Status: Abnormal   Collection Time: 09/08/20  7:35 AM  Result Value Ref Range   Glucose-Capillary 178 (H) 70 - 99 mg/dL    Comment: Glucose reference range applies only to samples taken after fasting for at least 8 hours.  Glucose, capillary     Status: Abnormal   Collection Time: 09/08/20 11:21 AM  Result Value Ref Range   Glucose-Capillary 121 (H) 70 - 99 mg/dL    Comment: Glucose reference range applies only to samples taken after fasting for at least 8 hours.   Personally reviewed CT- gastric distention likely from some degree of obstruction given the duodenal mass, constipation and difficult to follow colon, significant stool in the rectal vault, difficult to appreciate any internal hernia  DG  Abdomen 1 View  Result Date: 09/08/2020 CLINICAL DATA:  Nasogastric tube placement EXAM:  ABDOMEN - 1 VIEW COMPARISON:  09/07/2020 FINDINGS: Nasogastric tube tip and side port project within the stomach. Dilated and gas-filled bowel seen throughout the abdomen. Excreted contrast in the urinary bladder and right renal collecting system. IMPRESSION: Nasogastric tube tip and side port in the stomach. Electronically Signed   By: Ulyses Jarred M.D.   On: 09/08/2020 00:54   CT ABDOMEN PELVIS W CONTRAST  Result Date: 09/07/2020 CLINICAL DATA:  Nausea and vomiting coffee ground emesis EXAM: CT ABDOMEN AND PELVIS WITH CONTRAST TECHNIQUE: Multidetector CT imaging of the abdomen and pelvis was performed using the standard protocol following bolus administration of intravenous contrast. CONTRAST:  124mL OMNIPAQUE IOHEXOL 300 MG/ML  SOLN COMPARISON:  09/07/2020, CT 08/24/2020, CT 07/27/2020 FINDINGS: Lower chest: Lung bases demonstrate ground-glass density and patchy consolidation in the right lower lobe and posterior right lower lobe. No pleural effusion. Normal cardiac size. Marked circumferential wall thickening of the distal esophagus. Hepatobiliary: No calcified gallstone. No focal hepatic abnormality or biliary dilatation Pancreas: Truncated appearance of pancreas.  No inflammatory change. Spleen: Normal in size without focal abnormality. Adrenals/Urinary Tract: Adrenal glands are normal. No definitive left renal tissue identified, question very diminutive atrophic left kidney, series 3, image number 41. Small cysts in the right kidney. Subcentimeter hypodense lesions too small to further characterize. The bladder is normal. Stomach/Bowel: Marked fluid distension of the stomach. Some fluid-filled nondistended small bowel in the pelvis. Large volume of stool in the colon. Marked air distension of colon in the right upper quadrant, this appears to be sigmoid colon. Slightly narrowed appearing sigmoid colon that traverses horizontally and extends to right upper quadrant, series 3 image number 55 through 65.  Considerable stool distal to the loop of dilated sigmoid colon with large feces in the rectum. No acute bowel wall thickening. No corkscrew appearance of mesentery or vasculature in the region of sigmoid colon narrowing. Similar configuration of the colon on 07/27/2020 but more normal course of sigmoid colon on CT from January of 2022. Vascular/Lymphatic: Nonaneurysmal aorta. Aortic atherosclerosis. No suspicious nodes Reproductive: Prostate is unremarkable. 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 Left hip arthroplasty with artifact. IMPRESSION: 1. Interval marked circumferential wall thickening of the distal esophagus, probably due to esophagitis. Marked fluid distension of the stomach without evidence for small bowel obstruction, question functional obstruction. Consider NG tube decompression. 2. Generalized distension of the colon with large feces throughout. More focally dilated loop of bowel in the right upper quadrant appears to represent dilated segment of sigmoid colon. Slightly aberrant course of sigmoid colon which appears slightly narrowed at the right lower quadrant with adjacent slightly narrowed segment of rectosigmoid colon, question some degree of colon obstruction, potentially from internal hernia. No corkscrew appearance to suggest a volvulus. Considerable stool within the rectosigmoid colon distal to this suggesting that obstruction may be partial. Note that a similar configuration of the colon was present on CT from 07/27/2020 3. Ground-glass density and patchy consolidation in the right lower lobe and posterior right upper lobe concerning for pneumonia or aspiration. 4. Absent left kidney Aortic Atherosclerosis (ICD10-I70.0). Electronically Signed   By: Donavan Foil M.D.   On: 09/07/2020 22:57   DG ABD ACUTE 2+V W 1V CHEST  Result Date: 09/07/2020 CLINICAL DATA:   Abdominal distension, emesis and lower extremity  swelling EXAM: DG ABDOMEN ACUTE WITH 1 VIEW CHEST COMPARISON:  CT 08/24/2020, MR 06/25/2020, CT 06/19/2020 FINDINGS: There is extensive severe and increasing air distention of what appears to be the colon given some haustra markings although several distended loops of bowel in the low pelvis could reflect some small bowel dilatation as well. A large volume of feculent material is noted within portions of the distended colon as well. No convincing evidence of subdiaphragmatic free air at this time. No suspicious abdominal calcifications. Lung volumes are markedly diminished. Some streaky and patchy opacities in the lung bases may reflect atelectasis, airspace disease or sequela of aspiration in the provided clinical context. Cardiomediastinal contours are unremarkable for the portable upright technique. No pneumothorax. No layering pleural effusion. Telemetry leads overlie the chest and abdomen with nasal cannula the base of the neck. Multilevel degenerative changes in the shoulders, spine, hips and pelvis with a prior left hip arthroplasty. IMPRESSION: Severe and increasing air distention of what appears to be the colon given some haustra markings although several distended loops of bowel in the low pelvis could reflect some small bowel dilatation as well. Large volume of feculent material within portions of the distended colon as well. Appearance could reflect a colonic obstruction or pseudo-obstruction versus ileus. Electronically Signed   By: Lovena Le M.D.   On: 09/07/2020 20:58     Assessment & Plan:  YASER HARVILL is a 70 y.o. male with gastric distention, constipation who has a DVT and a duodenal mass of unknown etiology. He has dementia, is under guardianship of the state, and comes from the [place] center.  No surgical intervention indicated at this time. Agree with NG decompression. He would not be a surgical candidate for any excision of a duodenal  mass or cancer and I do not think he has an internal hernia.    -Palliative to discuss all options given multiple hospitalization, DVT, duodenal mass  -GI will decide about utility / plan for biopsy  -Disimpact/ enema   Discussed with Dr. Wynetta Emery and RN.    Virl Cagey 09/08/2020, 1:32 PM

## 2020-09-08 NOTE — Progress Notes (Addendum)
ANTICOAGULATION CONSULT NOTE - Follow Up Consult  Pharmacy Consult for heparin Indication: DVT  Labs: Recent Labs    09/07/20 2000 09/08/20 0603  HGB 10.6* 9.7*  HCT 32.5* 30.1*  PLT 390 339  HEPARINUNFRC  --  0.25*  CREATININE 0.93  --     Assessment: 69yo male subtherapeutic on heparin with initial dosing for DVT; no gtt issues or signs of bleeding per RN.  Goal of Therapy:  Heparin level 0.3-0.7 units/ml   Plan:  Will give small heparin bolus of 1000 units and increase heparin gtt by 1-2 units/kg/hr to 1100 units/hr and check level in 6 hours.    Wynona Neat, PharmD, BCPS  09/08/2020,7:23 AM

## 2020-09-09 ENCOUNTER — Inpatient Hospital Stay (HOSPITAL_COMMUNITY): Payer: Medicare Other

## 2020-09-09 DIAGNOSIS — E119 Type 2 diabetes mellitus without complications: Secondary | ICD-10-CM

## 2020-09-09 DIAGNOSIS — K3189 Other diseases of stomach and duodenum: Secondary | ICD-10-CM

## 2020-09-09 DIAGNOSIS — J690 Pneumonitis due to inhalation of food and vomit: Secondary | ICD-10-CM | POA: Diagnosis not present

## 2020-09-09 DIAGNOSIS — R140 Abdominal distension (gaseous): Secondary | ICD-10-CM | POA: Diagnosis not present

## 2020-09-09 LAB — COMPREHENSIVE METABOLIC PANEL
ALT: 29 U/L (ref 0–44)
AST: 13 U/L — ABNORMAL LOW (ref 15–41)
Albumin: 2.5 g/dL — ABNORMAL LOW (ref 3.5–5.0)
Alkaline Phosphatase: 89 U/L (ref 38–126)
Anion gap: 11 (ref 5–15)
BUN: 18 mg/dL (ref 8–23)
CO2: 34 mmol/L — ABNORMAL HIGH (ref 22–32)
Calcium: 10.8 mg/dL — ABNORMAL HIGH (ref 8.9–10.3)
Chloride: 97 mmol/L — ABNORMAL LOW (ref 98–111)
Creatinine, Ser: 0.95 mg/dL (ref 0.61–1.24)
GFR, Estimated: >60 mL/min (ref >60)
Glucose, Bld: 94 mg/dL (ref 70–99)
Potassium: 2.7 mmol/L — CL (ref 3.5–5.1)
Sodium: 142 mmol/L (ref 135–145)
Total Bilirubin: 0.6 mg/dL (ref 0.3–1.2)
Total Protein: 5.9 g/dL — ABNORMAL LOW (ref 6.5–8.1)

## 2020-09-09 LAB — GLUCOSE, CAPILLARY
Glucose-Capillary: 127 mg/dL — ABNORMAL HIGH (ref 70–99)
Glucose-Capillary: 80 mg/dL (ref 70–99)
Glucose-Capillary: 86 mg/dL (ref 70–99)
Glucose-Capillary: 86 mg/dL (ref 70–99)
Glucose-Capillary: 111 mg/dL — ABNORMAL HIGH (ref 70–99)

## 2020-09-09 LAB — CBC
HCT: 29.7 % — ABNORMAL LOW (ref 39.0–52.0)
Hemoglobin: 9.4 g/dL — ABNORMAL LOW (ref 13.0–17.0)
MCH: 27 pg (ref 26.0–34.0)
MCHC: 31.6 g/dL (ref 30.0–36.0)
MCV: 85.3 fL (ref 80.0–100.0)
Platelets: 362 10*3/uL (ref 150–400)
RBC: 3.48 MIL/uL — ABNORMAL LOW (ref 4.22–5.81)
RDW: 16.7 % — ABNORMAL HIGH (ref 11.5–15.5)
WBC: 8.4 10*3/uL (ref 4.0–10.5)
nRBC: 0 % (ref 0.0–0.2)

## 2020-09-09 LAB — MAGNESIUM: Magnesium: 1.3 mg/dL — ABNORMAL LOW (ref 1.7–2.4)

## 2020-09-09 LAB — HEPARIN LEVEL (UNFRACTIONATED)
Heparin Unfractionated: <0.1 IU/mL — ABNORMAL LOW (ref 0.30–0.70)
Heparin Unfractionated: 1.32 IU/mL — ABNORMAL HIGH (ref 0.30–0.70)

## 2020-09-09 IMAGING — DX DG ABDOMEN 1V
2 series · 2 of 2 positions shown · non-contrast
Comparison: [DATE]

CLINICAL DATA: Gastric distension

EXAM:
ABDOMEN - 1 VIEW

[abdomen supine (1 of 2)]
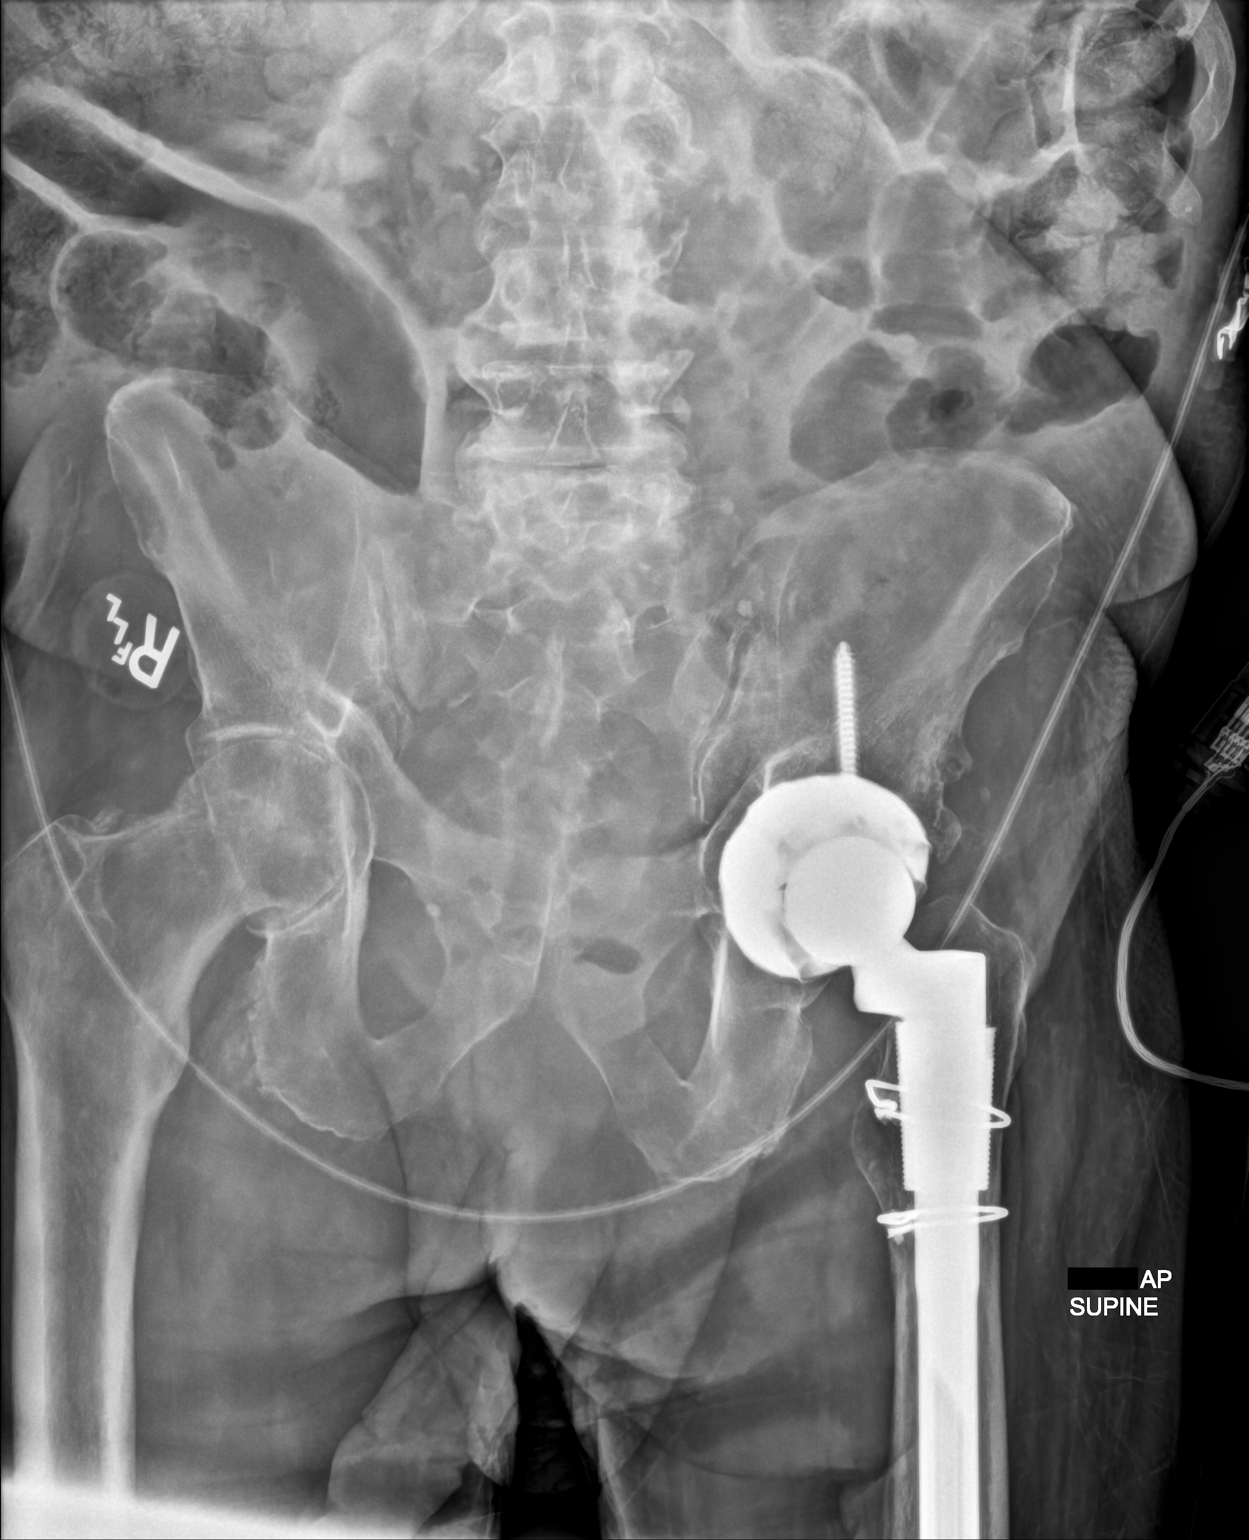

[abdomen supine (2 of 2)]
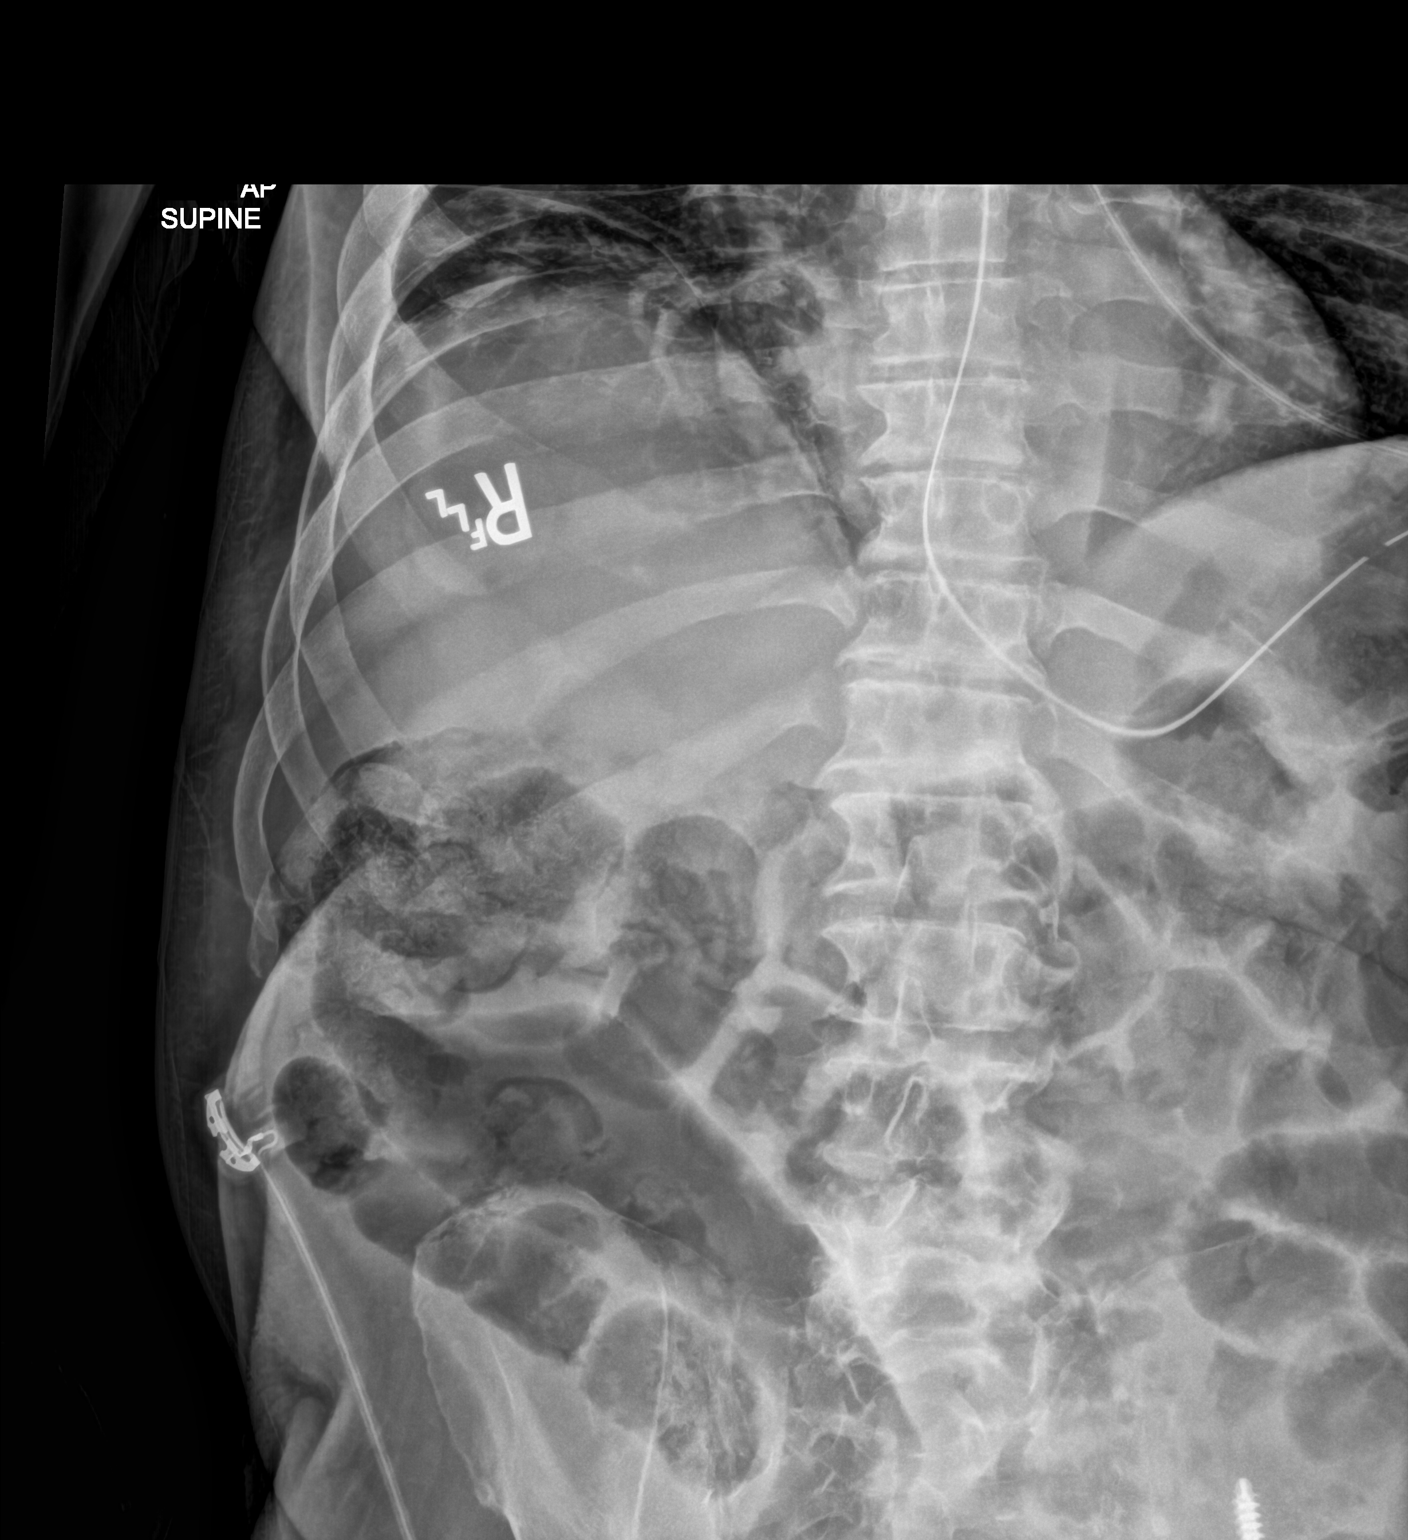

[2 of 2 positions shown; findings below may reference images not displayed]

FINDINGS: Nasogastric tube is seen with its distal tip noted along the
expected region of the gastric fundus. Air is seen throughout the
large and small bowel with mildly dilated small bowel loops seen
within the left lower quadrant. No radio-opaque calculi or other
significant radiographic abnormality are seen. A total left hip
replacement is seen.
IMPRESSION: 1. Nasogastric tube positioning, as described above.
2. Additional findings suggestive of an early partial small bowel
obstruction versus ileus.

## 2020-09-09 MED ORDER — ZOLEDRONIC ACID 4 MG/5ML IV CONC
4.0000 mg | Freq: Once | INTRAVENOUS | Status: AC
  Administered 2020-09-09: 4 mg via INTRAVENOUS
  Filled 2020-09-09: qty 5

## 2020-09-09 MED ORDER — ENOXAPARIN SODIUM 60 MG/0.6ML ~~LOC~~ SOLN
1.0000 mg/kg | Freq: Two times a day (BID) | SUBCUTANEOUS | Status: DC
  Administered 2020-09-09 – 2020-09-12 (×7): 60 mg via SUBCUTANEOUS
  Filled 2020-09-09 (×7): qty 0.6

## 2020-09-09 MED ORDER — HEPARIN BOLUS VIA INFUSION
2000.0000 [IU] | Freq: Once | INTRAVENOUS | Status: AC
  Administered 2020-09-09: 2000 [IU] via INTRAVENOUS
  Filled 2020-09-09: qty 2000

## 2020-09-09 MED ORDER — POTASSIUM CHLORIDE 10 MEQ/100ML IV SOLN
10.0000 meq | INTRAVENOUS | Status: AC
  Administered 2020-09-09 (×5): 10 meq via INTRAVENOUS
  Filled 2020-09-09 (×5): qty 100

## 2020-09-09 MED ORDER — MAGNESIUM SULFATE 4 GM/100ML IV SOLN
4.0000 g | Freq: Once | INTRAVENOUS | Status: AC
  Administered 2020-09-09: 4 g via INTRAVENOUS
  Filled 2020-09-09: qty 100

## 2020-09-09 MED ORDER — INSULIN ASPART 100 UNIT/ML ~~LOC~~ SOLN
0.0000 [IU] | Freq: Three times a day (TID) | SUBCUTANEOUS | Status: DC
  Administered 2020-09-10: 7 [IU] via SUBCUTANEOUS
  Administered 2020-09-11: 1 [IU] via SUBCUTANEOUS
  Administered 2020-09-11: 7 [IU] via SUBCUTANEOUS
  Administered 2020-09-11: 1 [IU] via SUBCUTANEOUS
  Administered 2020-09-12: 2 [IU] via SUBCUTANEOUS
  Administered 2020-09-12: 3 [IU] via SUBCUTANEOUS
  Administered 2020-09-12: 2 [IU] via SUBCUTANEOUS

## 2020-09-09 MED ORDER — HEPARIN (PORCINE) 25000 UT/250ML-% IV SOLN: 1100.0000 [IU]/h | INTRAVENOUS | Status: DC

## 2020-09-09 NOTE — Progress Notes (Addendum)
ANTICOAGULATION CONSULT NOTE -   Pharmacy Consult for Heparin--> lovenox Indication: RLE DVT  No Known Allergies  Patient Measurements: Height: 6' (182.9 cm) Weight: 60 kg (132 lb 4.4 oz) IBW/kg (Calculated) : 77.6 Heparin Dosing Weight: 68 kg  Vital Signs: Temp: 97.7 F (36.5 C) (04/03 0825) Temp Source: Axillary (04/03 0825) BP: 158/91 (04/03 0800) Pulse Rate: 102 (04/03 0837)  Labs: Recent Labs    09/07/20 2000 09/07/20 2000 09/08/20 0603 09/08/20 1331 09/08/20 2314 09/09/20 0712  HGB 10.6*  --  9.7*  --   --  9.4*  HCT 32.5*  --  30.1*  --   --  29.7*  PLT 390  --  339  --   --  362  HEPARINUNFRC  --    < > 0.25* 0.40 <0.10* 1.32*  CREATININE 0.93  --  0.94  --   --  0.95   < > = values in this interval not displayed.    Estimated Creatinine Clearance: 64 mL/min (by C-G formula based on SCr of 0.95 mg/dL).   Medical History: Past Medical History:  Diagnosis Date  . Diabetes mellitus without complication ([place])   . High cholesterol   . Hypertension     Medications:  See electronic med rec  Assessment: 70 y.o. M presents with abd pain and RLE DVT. Noted h/o ulcers along with duodenal mass. Hgb 10.6, plt wnl. No AC PTA.  4/3 Patient is still NPO d/t  gastric distention, constipation who has a DVT and a duodenal mass of unknown etiology. Patient is a difficult stick will transition to lovenox  Goal of Therapy:  Heparin level 0.3-0.7 units/ml Monitor platelets by anticoagulation protocol: Yes   Plan:  D/C heparin Lovenox 1mg /kg(60mg ) sq q12 h F/U plan and transition to po tx Monitor CBC/labs, and for any S/S of bleeding  Isac Sarna, BS Vena [place], BCPS Clinical Pharmacist Pager [phone] 09/09/2020,9:50 AM

## 2020-09-09 NOTE — Progress Notes (Signed)
[facility] Progress Note     Subjective: No pain reported. Awake in bed.   Objective: Vital signs in last 24 hours: Temp:  [97.7 F (36.5 C)-99.3 F (37.4 C)] 97.7 F (36.5 C) (04/03 0825) Pulse Rate:  [90-109] 105 (04/03 1100) Resp:  [9-28] 18 (04/03 1100) BP: (89-174)/(55-102) 146/81 (04/03 1100) SpO2:  [98 %-100 %] 99 % (04/03 1100) Weight:  [60 kg] 60 kg (04/03 0419) Last BM Date: 09/08/20  Intake/Output from previous day: 04/02 0701 - 04/03 0700 In: 2189.9 [I.V.:1889.9; IV Piggyback:300] Out: 2450 [Urine:1450; Emesis/NG output:1000] Intake/Output this shift: Total I/O In: -  Out: 650 [Urine:650]  General appearance: alert and cooperative GI: less distended, nontender  Lab Results:  Recent Labs    09/08/20 0603 09/09/20 0712  WBC 11.0* 8.4  HGB 9.7* 9.4*  HCT 30.1* 29.7*  PLT 339 362   BMET Recent Labs    09/08/20 0603 09/09/20 0712  NA 136 142  K 3.9 2.7*  CL 90* 97*  CO2 34* 34*  GLUCOSE 199* 94  BUN 27* 18  CREATININE 0.94 0.95  CALCIUM 11.3* 10.8*   PT/INR No results for input(s): LABPROT, INR in the last 72 hours.  Studies/Results: DG Abdomen 1 View  Result Date: 09/08/2020 CLINICAL DATA:  Nasogastric tube placement EXAM: ABDOMEN - 1 VIEW COMPARISON:  09/07/2020 FINDINGS: Nasogastric tube tip and side port project within the stomach. Dilated and gas-filled bowel seen throughout the abdomen. Excreted contrast in the urinary bladder and right renal collecting system. IMPRESSION: Nasogastric tube tip and side port in the stomach. Electronically Signed   By: Ulyses Jarred M.D.   On: 09/08/2020 00:54   CT ABDOMEN PELVIS W CONTRAST  Result Date: 09/07/2020 CLINICAL DATA:  Nausea and vomiting coffee ground emesis EXAM: CT ABDOMEN AND PELVIS WITH CONTRAST TECHNIQUE: Multidetector CT imaging of the abdomen and pelvis was performed using the standard protocol following bolus administration of intravenous contrast. CONTRAST:  162mL  OMNIPAQUE IOHEXOL 300 MG/ML  SOLN COMPARISON:  09/07/2020, CT 08/24/2020, CT 07/27/2020 FINDINGS: Lower chest: Lung bases demonstrate ground-glass density and patchy consolidation in the right lower lobe and posterior right lower lobe. No pleural effusion. Normal cardiac size. Marked circumferential wall thickening of the distal esophagus. Hepatobiliary: No calcified gallstone. No focal hepatic abnormality or biliary dilatation Pancreas: Truncated appearance of pancreas.  No inflammatory change. Spleen: Normal in size without focal abnormality. Adrenals/Urinary Tract: Adrenal glands are normal. No definitive left renal tissue identified, question very diminutive atrophic left kidney, series 3, image number 41. Small cysts in the right kidney. Subcentimeter hypodense lesions too small to further characterize. The bladder is normal. Stomach/Bowel: Marked fluid distension of the stomach. Some fluid-filled nondistended small bowel in the pelvis. Large volume of stool in the colon. Marked air distension of colon in the right upper quadrant, this appears to be sigmoid colon. Slightly narrowed appearing sigmoid colon that traverses horizontally and extends to right upper quadrant, series 3 image number 55 through 65. Considerable stool distal to the loop of dilated sigmoid colon with large feces in the rectum. No acute bowel wall thickening. No corkscrew appearance of mesentery or vasculature in the region of sigmoid colon narrowing. Similar configuration of the colon on 07/27/2020 but more normal course of sigmoid colon on CT from January of 2022. Vascular/Lymphatic: Nonaneurysmal aorta. Aortic atherosclerosis. No suspicious nodes Reproductive: Prostate is unremarkable. 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 Left hip arthroplasty with artifact. IMPRESSION: 1. Interval marked circumferential wall  thickening of the distal esophagus, probably due to esophagitis. Marked fluid distension of the stomach without evidence for small bowel obstruction, question functional obstruction. Consider NG tube decompression. 2. Generalized distension of the colon with large feces throughout. More focally dilated loop of bowel in the right upper quadrant appears to represent dilated segment of sigmoid colon. Slightly aberrant course of sigmoid colon which appears slightly narrowed at the right lower quadrant with adjacent slightly narrowed segment of rectosigmoid colon, question some degree of colon obstruction, potentially from internal hernia. No corkscrew appearance to suggest a volvulus. Considerable stool within the rectosigmoid colon distal to this suggesting that obstruction may be partial. Note that a similar configuration of the colon was present on CT from 07/27/2020 3. Ground-glass density and patchy consolidation in the right lower lobe and posterior right upper lobe concerning for pneumonia or aspiration. 4. Absent left kidney Aortic Atherosclerosis (ICD10-I70.0). Electronically Signed   By: Donavan Foil M.D.   On: 09/07/2020 22:57   DG ABD ACUTE 2+V W 1V CHEST  Result Date: 09/07/2020 CLINICAL DATA:  Abdominal distension, emesis and lower extremity swelling EXAM: DG ABDOMEN ACUTE WITH 1 VIEW CHEST COMPARISON:  CT 08/24/2020, MR 06/25/2020, CT 06/19/2020 FINDINGS: There is extensive severe and increasing air distention of what appears to be the colon given some haustra markings although several distended loops of bowel in the low pelvis could reflect some small bowel dilatation as well. A large volume of feculent material is noted within portions of the distended colon as well. No convincing evidence of subdiaphragmatic free air at this time. No suspicious abdominal calcifications. Lung volumes are markedly diminished. Some streaky and patchy opacities in the lung bases may reflect atelectasis, airspace  disease or sequela of aspiration in the provided clinical context. Cardiomediastinal contours are unremarkable for the portable upright technique. No pneumothorax. No layering pleural effusion. Telemetry leads overlie the chest and abdomen with nasal cannula the base of the neck. Multilevel degenerative changes in the shoulders, spine, hips and pelvis with a prior left hip arthroplasty. IMPRESSION: Severe and increasing air distention of what appears to be the colon given some haustra markings although several distended loops of bowel in the low pelvis could reflect some small bowel dilatation as well. Large volume of feculent material within portions of the distended colon as well. Appearance could reflect a colonic obstruction or pseudo-obstruction versus ileus. Electronically Signed   By: Lovena Le M.D.   On: 09/07/2020 20:58    Anti-infectives: Anti-infectives (From admission, onward)   Start     Dose/Rate Route Frequency Ordered Stop   09/08/20 0200  Ampicillin-Sulbactam (UNASYN) 3 g in sodium chloride 0.9 % 100 mL IVPB        3 g 200 mL/hr over 30 Minutes Intravenous Every 8 hours 09/08/20 0122        Assessment/Plan: Mr. Murcia is a 70 yo with gastric distention, duodenal mass of unknown etiology, constipation and question possible internal hernia on CT. I do not think he has an internal hernia. Xray with non specific bowel dilation, stool in right colon, stomach less distended.   NG for now Enemas for evacuation  Palliative for goals of care    LOS: 1 day    Virl Cagey 09/09/2020

## 2020-09-09 NOTE — Progress Notes (Signed)
Daniel [name], M.D. Gastroenterology & Hepatology   Interval History:  No acute events overnight.   The patient had some tap water enemas given and Fleet enemas, had 2 large bowel movements with this and his abdominal distention much more improved since then. He has remained tachycardic but has been otherwise hemodynamically stable and afebrile.  He does not provide too much information.  Based on nursing report, he had a total of 1000 cc drained in the last 24 hours from his NG tube, which have had a brown-colored appearance with no presence of fresh blood or black contents.  He has not presented any melena or hematochezia. Labs have remained stable with hemoglobin similar to baseline of 9.4, normal platelet count and white blood cell count.  Today his potassium is markedly low 2.7 with liver enzymes and renal function within normal limits.  Inpatient Medications:  Current Facility-Administered Medications:  .  0.9 %  sodium chloride infusion, , Intravenous, Continuous, Zierle-Ghosh, Asia B, DO, Last Rate: 75 mL/hr at 09/09/20 0453, Infusion Verify at 09/09/20 0453 .  acetaminophen (TYLENOL) tablet 650 mg, 650 mg, Oral, Q6H PRN **OR** acetaminophen (TYLENOL) suppository 650 mg, 650 mg, Rectal, Q6H PRN, Zierle-Ghosh, Asia B, DO .  Ampicillin-Sulbactam (UNASYN) 3 g in sodium chloride 0.9 % 100 mL IVPB, 3 g, Intravenous, Q8H, Zierle-Ghosh, Asia B, DO, Last Rate: 200 mL/hr at 09/09/20 0940, 3 g at 09/09/20 0940 .  Chlorhexidine Gluconate Cloth 2 % PADS 6 each, 6 each, Topical, Daily, Johnson, Clanford L, MD, 6 each at 09/08/20 1159 .  enoxaparin (LOVENOX) injection 60 mg, 1 mg/kg, Subcutaneous, Q12H, Johnson, Clanford L, MD, 60 mg at 09/09/20 1107 .  insulin aspart (novoLOG) injection 0-15 Units, 0-15 Units, Subcutaneous, Q4H, Johnson, Clanford L, MD, 2 Units at 09/09/20 0451 .  metoprolol tartrate (LOPRESSOR) injection 7.5 mg, 7.5 mg, Intravenous, Q6H, Johnson, Clanford L, MD, 7.5 mg at 09/09/20  1115 .  morphine 2 MG/ML injection 2 mg, 2 mg, Intravenous, Q4H PRN, Johnson, Clanford L, MD .  ondansetron (ZOFRAN) tablet 4 mg, 4 mg, Oral, Q6H PRN **OR** ondansetron (ZOFRAN) injection 4 mg, 4 mg, Intravenous, Q6H PRN, Zierle-Ghosh, Asia B, DO .  pantoprazole (PROTONIX) injection 40 mg, 40 mg, Intravenous, Q12H, Johnson, Clanford L, MD, 40 mg at 09/09/20 0937 .  potassium chloride 10 mEq in 100 mL IVPB, 10 mEq, Intravenous, Q1 Hr x 5, Johnson, Clanford L, MD, Last Rate: 100 mL/hr at 09/09/20 1101, 10 mEq at 09/09/20 1101   I/O    Intake/Output Summary (Last 24 hours) at 09/09/2020 1151 Last data filed at 09/09/2020 0453 Gross per 24 hour  Intake 2189.94 ml  Output 1850 ml  Net 339.94 ml     Physical Exam: Temp:  [97.7 F (36.5 C)-99.3 F (37.4 C)] 97.7 F (36.5 C) (04/03 0825) Pulse Rate:  [90-111] 105 (04/03 1000) Resp:  [9-28] 16 (04/03 1000) BP: (89-174)/(55-102) 134/74 (04/03 1000) SpO2:  [98 %-100 %] 99 % (04/03 0500) Weight:  [60 kg] 60 kg (04/03 0419)  Temp (24hrs), Avg:98.3 F (36.8 C), Min:97.7 F (36.5 C), Max:99.3 F (37.4 C) GENERAL: The patient is awake and answers basic questions, in no acute distress. NG tube with dark drainage. HEENT: Head is normocephalic and atraumatic. EOMI are intact. Mouth is well hydrated and without lesions. NECK: Supple. No masses LUNGS: Clear to auscultation. No presence of rhonchi/wheezing/rales. Adequate chest expansion HEART: RRR, normal s1 and s2. ABDOMEN: Soft, nontender, no guarding, no peritoneal signs, and nondistended today. BS +.  No masses. EXTREMITIES: Without any cyanosis, clubbing, rash, lesions or edema. NEUROLOGIC: Awake, no focal motor deficit. SKIN: no jaundice, no rashes  Laboratory Data: CBC:     Component Value Date/Time   WBC 8.4 09/09/2020 0712   RBC 3.48 (L) 09/09/2020 0712   HGB 9.4 (L) 09/09/2020 0712   HCT 29.7 (L) 09/09/2020 0712   PLT 362 09/09/2020 0712   MCV 85.3 09/09/2020 0712   MCH 27.0  09/09/2020 0712   MCHC 31.6 09/09/2020 0712   RDW 16.7 (H) 09/09/2020 0712   LYMPHSABS 0.7 09/07/2020 2000   MONOABS 0.4 09/07/2020 2000   EOSABS 0.0 09/07/2020 2000   BASOSABS 0.0 09/07/2020 2000   COAG:  Lab Results  Component Value Date   INR 1.1 02/21/2020   INR 1.0 01/05/2020    BMP:  BMP Latest Ref Rng & Units 09/09/2020 09/08/2020 09/07/2020  Glucose 70 - 99 mg/dL 94 199(H) 284(H)  BUN 8 - 23 mg/dL 18 27(H) 27(H)  Creatinine 0.61 - 1.24 mg/dL 0.95 0.94 0.93  Sodium 135 - 145 mmol/L 142 136 133(L)  Potassium 3.5 - 5.1 mmol/L 2.7(LL) 3.9 3.8  Chloride 98 - 111 mmol/L 97(L) 90(L) 87(L)  CO2 22 - 32 mmol/L 34(H) 34(H) 31  Calcium 8.9 - 10.3 mg/dL 10.8(H) 11.3(H) 11.8(H)    HEPATIC:  Hepatic Function Latest Ref Rng & Units 09/09/2020 09/08/2020 09/07/2020  Total Protein 6.5 - 8.1 g/dL 5.9(L) 6.2(L) 7.5  Albumin 3.5 - 5.0 g/dL 2.5(L) 2.6(L) 3.1(L)  AST 15 - 41 U/L 13(L) 21 39  ALT 0 - 44 U/L 29 41 56(H)  Alk Phosphatase 38 - 126 U/L 89 103 128(H)  Total Bilirubin 0.3 - 1.2 mg/dL 0.6 0.5 0.5    CARDIAC: No results found for: CKTOTAL, CKMB, CKMBINDEX, TROPONINI    Imaging: I personally reviewed and interpreted the available labs, imaging and endoscopic files.   Assessment/Plan: Daniel Maldonado Mooreis a 70 y.o.malewith past medical history of diabetes, hyperlipidemia, hypertension, cognitive impairment, history of duodenal mass and CVA, who was admitted to the hospital after being found to have a DVT at [facility].  Was found to have distended stomach without any signs of acute obstruction of his bowels but could represent a partial obstruction leading to his symptoms.  Based on the endoscopic pictures from his previous EGD, I do not consider likely that the mass has grown so fast to cause any significant luminal narrowing that we will lead to this presentation.  Will be useful to have a tissue diagnosis and a thorough inspection of his duodenal mass to determine the etiology of the lesion  and the potential for endoscopic resectability.  He is scheduled to see Dr. Rush Landmark next week to schedule this procedure.  I do not consider that this needs to be done acutely, especially in the setting of large amount of gastric contents retained which are currently being drained through NG tube.  Nevertheless, if he has any evidence of overt gastrointestinal bleeding or worsening anemia, will consider doing this as inpatient as patient is on Plavix and most recently on heparin ggt.  For now, we will continue supportive measures with NG tube drainage of his gastrointestinal contents and he will benefit from management with enemas but also starting Miralax once tolerating oral intake.  # Duodenal mass - Continue NG tube drainage, may clamp it if drainage decreases  - Once tolerating PO, start Miralax daily - Tap water enemas TID - Trend H/H - Unless presenting any worsening anemia or  any evidence of overt gastrointestinal bleeding, will defer endoscopic evaluation until seen by Dr. Rush Landmark - Appreciate gen surgery recs  Daniel Peppers, MD Gastroenterology and Hepatology [facility]

## 2020-09-09 NOTE — Progress Notes (Signed)
ANTICOAGULATION CONSULT NOTE -   Pharmacy Consult for Heparin Indication: RLE DVT  No Known Allergies  Patient Measurements: Height: 6' (182.9 cm) Weight: 60 kg (132 lb 4.4 oz) IBW/kg (Calculated) : 77.6 Heparin Dosing Weight: 68 kg  Vital Signs: Temp: 97.7 F (36.5 C) (04/03 0825) Temp Source: Axillary (04/03 0825) BP: 158/91 (04/03 0800) Pulse Rate: 102 (04/03 0837)  Labs: Recent Labs    09/07/20 2000 09/07/20 2000 09/08/20 0603 09/08/20 1331 09/08/20 2314 09/09/20 0712  HGB 10.6*  --  9.7*  --   --  9.4*  HCT 32.5*  --  30.1*  --   --  29.7*  PLT 390  --  339  --   --  362  HEPARINUNFRC  --    < > 0.25* 0.40 <0.10* 1.32*  CREATININE 0.93  --  0.94  --   --  0.95   < > = values in this interval not displayed.    Estimated Creatinine Clearance: 64 mL/min (by C-G formula based on SCr of 0.95 mg/dL).   Medical History: Past Medical History:  Diagnosis Date  . Diabetes mellitus without complication ([place])   . High cholesterol   . Hypertension     Medications:  See electronic med rec  Assessment: 70 y.o. M presents with abd pain and RLE DVT. Noted h/o ulcers along with duodenal mass. Hgb 10.6, plt wnl. No AC PTA. To begin heparin per pharmacy.  HL 1.32 is supratherapeutic  Goal of Therapy:  Heparin level 0.3-0.7 units/ml Monitor platelets by anticoagulation protocol: Yes   Plan:  Hold heparin for 1 hour, then decrease Heparin gtt to 11000 units/hr Will f/u heparin level in ~ 6 hours Daily heparin level and CBC  Isac Sarna, BS Vena Austria, BCPS Clinical Pharmacist Pager [phone] 09/09/2020,9:28 AM

## 2020-09-09 NOTE — Progress Notes (Addendum)
PROGRESS NOTE   Daniel [name]  OJJ:[id number] DOB: [birth year] DOA: 09/07/2020 PCP: Leonie Douglas, MD   Chief Complaint  Patient presents with  . Medical Clearance    Possible DVT Per [facility] staff   Level of care: Telemetry  Brief Admission History:  70 y.o. male,with history of HTN, HLD, and DMII presents to the ED with a chief complaint of DVT. Patient is a ward of the state from the [place] center. Unfortunately he is not able to provide much history. He answers most questions with "I don't know." From report, and chart review, it seems that an [place] of right lower extremity for DVT was done at the facility. The result revealed a DVT. Patient was then transported to the ED. Upon arrival - patient also had significant abdominal distention. Patient denied abdominal pain at arrival, and still denies abdominal pain now. Workup revealed a bowel obstruction. NG tube was placed, and within a few minutes had 1 L of dark brown/black output. Rectal occult blood test was negative. Gastric contents occult blood is pending. Patient is afebrile without leukocytosis. He has had a recent admission for GI bleed - hemoglobin is stable at 10.6. Chemistry panel reveals a hyperglycemia at 284, alk phos 128, albumin 3.1, AST/ALT 39/56. CT abdomen shows esophagitis, distended stomach indicative of functional bowel obstruction. It also reveals pneumonia vs aspiration, and absent left kidney.  EKG reveals sinus tach with a heart rate 124, QTc 464.  Given the DVT patient was started on heparin drip per pharmacy consult.  Dr. Constance Haw was consulted and advised NG tube and she will see in the morning.  GI was consulted and also reports they will see in a.m.  Patient was given 2 L of fluid, Zofran 4 mg, and Protonix.  Assessment & Plan:   Principal Problem:   Abdominal distention Active Problems:   Type 2 diabetes mellitus without complication, without long-term current use of insulin (HCC)   Hypomagnesemia    Aspiration pneumonia (HCC)   Hypercalcemia   Severe constipation with partial bowel obstruction - continue NG to suction, NPO status, continue IV protonix, continue tap water enemas TID.  He has evacuated large amount of stool in last 24 hours but still with large amount of stool right side of colon per Xray today.   Duodenal mass - GI following, defer endoscopy until seen by Dr. 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 Continue normal saline infusion.   Hypomagnesemia - corrected calcium is 12.0.  IV replacement ordered (4gm), repeat in AM.   Hypokalemia - IV replacement ordered, also repleting the potassium.    Aspiration pneumonia -IV amp/sulbactam ordered.   Type 2 DM - Pt is NPO, CBG testing every 4 hours with SSI coverage as needed.   Goals of care: Requesting  Palliative consultation for goals of care.  Pt has a legal guardian.  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    RLE DVT - He is currently NPO.   Full dose enoxaparin per pharm D ordered.    DVT prophylaxis: enoxaparin Code Status: currently full  Family Communication: has a legal guardian, unable to reach today Disposition:  Status is: Inpatient  Remains inpatient appropriate because:IV treatments appropriate due to intensity of illness or inability to take PO and Inpatient level of care appropriate due to severity of illness  Dispo: The patient is from: SNF  Anticipated d/c is to: SNF              Patient currently is not medically stable to d/c.   Difficult to place patient No  Consultants:   GI  Surgery   Procedures:     Antimicrobials:  augmentin 4/3>>   Subjective: Pt nonverbal.   Objective: Vitals:   09/09/20 0837 09/09/20 0900 09/09/20 1000 09/09/20 1100  BP:  138/79 134/74 (!) 146/81  Pulse: (!) 102 100 (!) 105 (!) 105  Resp: (!) _0 Temp:      TempSrc:       SpO2:    99%  Weight:      Height:        Intake/Output Summary (Last 24 hours) at 09/09/2020 1346 Last data filed at 09/09/2020 1330 Gross per 24 hour  Intake 2189.94 ml  Output 2500 ml  Net -310.06 ml   Filed Weights   09/07/20 1944 09/08/20 0246 09/09/20 0419  Weight: 68 kg 66 kg 60 kg    Examination:  General exam: somnolent, chronically ill appearing, Appears calm and comfortable  Respiratory system: Clear to auscultation. Respiratory effort normal. Cardiovascular system: tachycardic rate, normal S1 & S2 heard. No JVD, murmurs, rubs, gallops or clicks. No pedal edema. Gastrointestinal system: Abdomen is nondistended, soft. No organomegaly or masses felt. Normal bowel sounds heard. Central nervous system: somnolent but arousable. No focal neurological deficits. Extremities: Symmetric 5 x 5 power. Skin: No rashes, lesions or ulcers.  Psychiatry: Judgement and insight appear poor. Mood & affect flat.   Data Reviewed: I have personally reviewed following labs and imaging studies  CBC: Recent Labs  Lab 09/07/20 2000 09/08/20 0603 09/09/20 0712  WBC 8.0 11.0* 8.4  NEUTROABS 6.9  --   --   HGB 10.6* 9.7* 9.4*  HCT 32.5* 30.1* 29.7*  MCV 84.4 85.0 85.3  PLT 390 339 629    Basic Metabolic Panel: Recent Labs  Lab 09/07/20 2000 09/08/20 0603 09/09/20 0712  NA 133* 136 142  K 3.8 3.9 2.7*  CL 87* 90* 97*  CO2 31 34* 34*  GLUCOSE 284* 199* 94  BUN 27* 27* 18  CREATININE 0.93 0.94 0.95  CALCIUM 11.8* 11.3* 10.8*  MG 1.1* 1.6* 1.3*    GFR: Estimated Creatinine Clearance: 64 mL/min (by C-G formula based on SCr of 0.95 mg/dL).  Liver Function Tests: Recent Labs  Lab 09/07/20 2000 09/08/20 0603 09/09/20 0712  AST 39 21 13*  ALT 56* 41 29  ALKPHOS 128* 103 89  BILITOT 0.5 0.5 0.6  PROT 7.5 6.2* 5.9*  ALBUMIN 3.1* 2.6* 2.5*    CBG: Recent Labs  Lab 09/08/20 2002 09/08/20 2357 09/09/20 0417 09/09/20 0805 09/09/20 1155  GLUCAP 167* 114* 127* 80 86     Recent Results (from the past 240 hour(s))  Resp Panel by RT-PCR (Flu A&B, Covid) Nasopharyngeal Swab     Status: None   Collection Time: 09/08/20  1:10 AM   Specimen: Nasopharyngeal Swab; Nasopharyngeal(NP) swabs in vial transport medium  Result Value Ref Range Status   SARS Coronavirus 2 by RT PCR NEGATIVE NEGATIVE Final    Comment: (NOTE) SARS-CoV-2 target nucleic acids are NOT DETECTED.  The SARS-CoV-2 RNA is generally detectable in upper respiratory specimens during the acute phase of infection. The lowest concentration of SARS-CoV-2 viral copies this assay can detect is 138 copies/mL. A negative result does not preclude SARS-Cov-2 infection and should not be used as the sole basis for treatment  or other patient management decisions. A negative result may occur with  improper specimen collection/handling, submission of specimen other than nasopharyngeal swab, presence of viral mutation(s) within the areas targeted by this assay, and inadequate number of viral copies(<138 copies/mL). A negative result must be combined with clinical observations, patient history, and epidemiological information. The expected result is Negative.  Fact Sheet for Patients:  EntrepreneurPulse.com.au  Fact Sheet for Healthcare Providers:  IncredibleEmployment.be  This test is no t yet approved or cleared by the [place] FDA and  has been authorized for detection and/or diagnosis of SARS-CoV-2 by FDA under an Emergency Use Authorization (EUA). This EUA will remain  in effect (meaning this test can be used) for the duration of the COVID-19 declaration under Section 564(b)(1) of the Act, 21 U.S.C.section 360bbb-3(b)(1), unless the authorization is terminated  or revoked sooner.       Influenza A by PCR NEGATIVE NEGATIVE Final   Influenza B by PCR NEGATIVE NEGATIVE Final    Comment: (NOTE) The Xpert Xpress SARS-CoV-2/FLU/RSV plus assay is intended as an  aid in the diagnosis of influenza from Nasopharyngeal swab specimens and should not be used as a sole basis for treatment. Nasal washings and aspirates are unacceptable for Xpert Xpress SARS-CoV-2/FLU/RSV testing.  Fact Sheet for Patients: EntrepreneurPulse.com.au  Fact Sheet for Healthcare Providers: IncredibleEmployment.be  This test is not yet approved or cleared by the [place] FDA and has been authorized for detection and/or diagnosis of SARS-CoV-2 by FDA under an Emergency Use Authorization (EUA). This EUA will remain in effect (meaning this test can be used) for the duration of the COVID-19 declaration under Section 564(b)(1) of the Act, 21 U.S.C. section 360bbb-3(b)(1), unless the authorization is terminated or revoked.  Performed at [facility], [address] [place], [place] [address]   MRSA PCR Screening     Status: None   Collection Time: 09/08/20  2:26 AM   Specimen: Nasal Mucosa; Nasopharyngeal  Result Value Ref Range Status   MRSA by PCR NEGATIVE NEGATIVE Final    Comment:        The GeneXpert MRSA Assay (FDA approved for NASAL specimens only), is one component of a comprehensive MRSA colonization surveillance program. It is not intended to diagnose MRSA infection nor to guide or monitor treatment for MRSA infections. Performed at [facility], [address] [place], [place] [address]      Radiology Studies: DG Abdomen 1 View  Result Date: 09/08/2020 CLINICAL DATA:  Nasogastric tube placement EXAM: ABDOMEN - 1 VIEW COMPARISON:  09/07/2020 FINDINGS: Nasogastric tube tip and side port project within the stomach. Dilated and gas-filled bowel seen throughout the abdomen. Excreted contrast in the urinary bladder and right renal collecting system. IMPRESSION: Nasogastric tube tip and side port in the stomach. Electronically Signed   By: Ulyses Jarred M.D.   On: 09/08/2020 00:54   CT ABDOMEN PELVIS W CONTRAST  Result  Date: 09/07/2020 CLINICAL DATA:  Nausea and vomiting coffee ground emesis EXAM: CT ABDOMEN AND PELVIS WITH CONTRAST TECHNIQUE: Multidetector CT imaging of the abdomen and pelvis was performed using the standard protocol following bolus administration of intravenous contrast. CONTRAST:  172m OMNIPAQUE IOHEXOL 300 MG/ML  SOLN COMPARISON:  09/07/2020, CT 08/24/2020, CT 07/27/2020 FINDINGS: Lower chest: Lung bases demonstrate ground-glass density and patchy consolidation in the right lower lobe and posterior right lower lobe. No pleural effusion. Normal cardiac size. Marked circumferential wall thickening of the distal esophagus. Hepatobiliary: No calcified gallstone. No focal hepatic abnormality or biliary  dilatation Pancreas: Truncated appearance of pancreas.  No inflammatory change. Spleen: Normal in size without focal abnormality. Adrenals/Urinary Tract: Adrenal glands are normal. No definitive left renal tissue identified, question very diminutive atrophic left kidney, series 3, image number 41. Small cysts in the right kidney. Subcentimeter hypodense lesions too small to further characterize. The bladder is normal. Stomach/Bowel: Marked fluid distension of the stomach. Some fluid-filled nondistended small bowel in the pelvis. Large volume of stool in the colon. Marked air distension of colon in the right upper quadrant, this appears to be sigmoid colon. Slightly narrowed appearing sigmoid colon that traverses horizontally and extends to right upper quadrant, series 3 image number 55 through 65. Considerable stool distal to the loop of dilated sigmoid colon with large feces in the rectum. No acute bowel wall thickening. No corkscrew appearance of mesentery or vasculature in the region of sigmoid colon narrowing. Similar configuration of the colon on 07/27/2020 but more normal course of sigmoid colon on CT from January of 2022. Vascular/Lymphatic: Nonaneurysmal aorta. Aortic atherosclerosis. No suspicious nodes  Reproductive: Prostate is unremarkable. 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 Left hip arthroplasty with artifact. IMPRESSION: 1. Interval marked circumferential wall thickening of the distal esophagus, probably due to esophagitis. Marked fluid distension of the stomach without evidence for small bowel obstruction, question functional obstruction. Consider NG tube decompression. 2. Generalized distension of the colon with large feces throughout. More focally dilated loop of bowel in the right upper quadrant appears to represent dilated segment of sigmoid colon. Slightly aberrant course of sigmoid colon which appears slightly narrowed at the right lower quadrant with adjacent slightly narrowed segment of rectosigmoid colon, question some degree of colon obstruction, potentially from internal hernia. No corkscrew appearance to suggest a volvulus. Considerable stool within the rectosigmoid colon distal to this suggesting that obstruction may be partial. Note that a similar configuration of the colon was present on CT from 07/27/2020 3. Ground-glass density and patchy consolidation in the right lower lobe and posterior right upper lobe concerning for pneumonia or aspiration. 4. Absent left kidney Aortic Atherosclerosis (ICD10-I70.0). Electronically Signed   By: Donavan Foil M.D.   On: 09/07/2020 22:57   DG ABD ACUTE 2+V W 1V CHEST  Result Date: 09/07/2020 CLINICAL DATA:  Abdominal distension, emesis and lower extremity swelling EXAM: DG ABDOMEN ACUTE WITH 1 VIEW CHEST COMPARISON:  CT 08/24/2020, MR 06/25/2020, CT 06/19/2020 FINDINGS: There is extensive severe and increasing air distention of what appears to be the colon given some haustra markings although several distended loops of bowel in the low pelvis could reflect some small bowel dilatation as well. A large volume of feculent  material is noted within portions of the distended colon as well. No convincing evidence of subdiaphragmatic free air at this time. No suspicious abdominal calcifications. Lung volumes are markedly diminished. Some streaky and patchy opacities in the lung bases may reflect atelectasis, airspace disease or sequela of aspiration in the provided clinical context. Cardiomediastinal contours are unremarkable for the portable upright technique. No pneumothorax. No layering pleural effusion. Telemetry leads overlie the chest and abdomen with nasal cannula the base of the neck. Multilevel degenerative changes in the shoulders, spine, hips and pelvis with a prior left hip arthroplasty. IMPRESSION: Severe and increasing air distention of what appears to be the colon given some haustra markings although several distended loops of bowel in the low  pelvis could reflect some small bowel dilatation as well. Large volume of feculent material within portions of the distended colon as well. Appearance could reflect a colonic obstruction or pseudo-obstruction versus ileus. Electronically Signed   By: Lovena Le M.D.   On: 09/07/2020 20:58   Scheduled Meds: . Chlorhexidine Gluconate Cloth  6 each Topical Daily  . enoxaparin (LOVENOX) injection  1 mg/kg Subcutaneous Q12H  . insulin aspart  0-15 Units Subcutaneous Q4H  . metoprolol tartrate  7.5 mg Intravenous Q6H  . pantoprazole (PROTONIX) IV  40 mg Intravenous Q12H   Continuous Infusions: . sodium chloride 75 mL/hr at 09/09/20 0453  . ampicillin-sulbactam (UNASYN) IV 3 g (09/09/20 0940)  . magnesium sulfate bolus IVPB    . potassium chloride 10 mEq (09/09/20 1306)  . zoledronic acid (ZOMETA) IV      LOS: 1 day   Time spent: 33 mins   Daniel Tigges Wynetta Emery, MD How to contact the [facility] Attending or Consulting provider [place] or covering provider during after hours [place], for this patient?  1. Check the care team in [facility] and look for a) attending/consulting TRH provider  listed and b) the [facility] team listed 2. Log into [email or web address] and use [place]'s universal password to access. If you do not have the password, please contact the hospital operator. 3. Locate the [facility] provider you are looking for under [facility] and page to a number that you can be directly reached. 4. If you still have difficulty reaching the provider, please page the [facility] (Director on Call) for the Hospitalists listed on amion for assistance.  09/09/2020, 1:46 PM

## 2020-09-09 NOTE — Progress Notes (Signed)
ANTICOAGULATION CONSULT NOTE - Follow Up Consult  Pharmacy Consult for heparin Indication: DVT  Labs: Recent Labs    09/07/20 2000 09/08/20 0603 09/08/20 1331 09/08/20 2314  HGB 10.6* 9.7*  --   --   HCT 32.5* 30.1*  --   --   PLT 390 339  --   --   HEPARINUNFRC  --  0.25* 0.40 <0.10*  CREATININE 0.93 0.94  --   --     Assessment: 70yo male now subtherapeutic on heparin after one level at goal; no gtt issues or signs of bleeding per RN.  Goal of Therapy:  Heparin level 0.3-0.7 units/ml   Plan:  Will give small heparin bolus of 2000 units and increase heparin gtt by 3 units/kg/hr to 1300 units/hr and check level in 6 hours.    Wynona Neat, PharmD, BCPS  09/09/2020,12:25 AM

## 2020-09-10 ENCOUNTER — Inpatient Hospital Stay (HOSPITAL_COMMUNITY): Payer: Medicare Other

## 2020-09-10 DIAGNOSIS — G300 Alzheimer's disease with early onset: Secondary | ICD-10-CM

## 2020-09-10 DIAGNOSIS — F0280 Dementia in other diseases classified elsewhere without behavioral disturbance: Secondary | ICD-10-CM

## 2020-09-10 DIAGNOSIS — R140 Abdominal distension (gaseous): Secondary | ICD-10-CM

## 2020-09-10 LAB — COMPREHENSIVE METABOLIC PANEL
ALT: 21 U/L (ref 0–44)
AST: 10 U/L — ABNORMAL LOW (ref 15–41)
Albumin: 2.5 g/dL — ABNORMAL LOW (ref 3.5–5.0)
Alkaline Phosphatase: 84 U/L (ref 38–126)
Anion gap: 18 — ABNORMAL HIGH (ref 5–15)
BUN: 22 mg/dL (ref 8–23)
CO2: 27 mmol/L (ref 22–32)
Calcium: 10.8 mg/dL — ABNORMAL HIGH (ref 8.9–10.3)
Chloride: 100 mmol/L (ref 98–111)
Creatinine, Ser: 1.21 mg/dL (ref 0.61–1.24)
GFR, Estimated: >60 mL/min (ref >60)
Glucose, Bld: 120 mg/dL — ABNORMAL HIGH (ref 70–99)
Potassium: 3.1 mmol/L — ABNORMAL LOW (ref 3.5–5.1)
Sodium: 145 mmol/L (ref 135–145)
Total Bilirubin: 1.3 mg/dL — ABNORMAL HIGH (ref 0.3–1.2)
Total Protein: 5.8 g/dL — ABNORMAL LOW (ref 6.5–8.1)

## 2020-09-10 LAB — GLUCOSE, CAPILLARY
Glucose-Capillary: 109 mg/dL — ABNORMAL HIGH (ref 70–99)
Glucose-Capillary: 117 mg/dL — ABNORMAL HIGH (ref 70–99)
Glucose-Capillary: 123 mg/dL — ABNORMAL HIGH (ref 70–99)
Glucose-Capillary: 332 mg/dL — ABNORMAL HIGH (ref 70–99)
Glucose-Capillary: 313 mg/dL — ABNORMAL HIGH (ref 70–99)

## 2020-09-10 LAB — CBC
HCT: 28.2 % — ABNORMAL LOW (ref 39.0–52.0)
Hemoglobin: 8.7 g/dL — ABNORMAL LOW (ref 13.0–17.0)
MCH: 27 pg (ref 26.0–34.0)
MCHC: 30.9 g/dL (ref 30.0–36.0)
MCV: 87.6 fL (ref 80.0–100.0)
Platelets: 371 10*3/uL (ref 150–400)
RBC: 3.22 MIL/uL — ABNORMAL LOW (ref 4.22–5.81)
RDW: 16.6 % — ABNORMAL HIGH (ref 11.5–15.5)
WBC: 8.2 10*3/uL (ref 4.0–10.5)
nRBC: 0 % (ref 0.0–0.2)

## 2020-09-10 LAB — MAGNESIUM: Magnesium: 1.6 mg/dL — ABNORMAL LOW (ref 1.7–2.4)

## 2020-09-10 MED ORDER — MAGNESIUM SULFATE 4 GM/100ML IV SOLN
4.0000 g | Freq: Once | INTRAVENOUS | Status: AC
  Administered 2020-09-10: 4 g via INTRAVENOUS
  Filled 2020-09-10: qty 100

## 2020-09-10 MED ORDER — POLYETHYLENE GLYCOL 3350 17 G PO PACK
17.0000 g | PACK | Freq: Every day | ORAL | Status: DC
  Administered 2020-09-10: 17 g via ORAL
  Filled 2020-09-10 (×2): qty 1

## 2020-09-10 MED ORDER — POTASSIUM CHLORIDE 10 MEQ/100ML IV SOLN
10.0000 meq | INTRAVENOUS | Status: AC
  Administered 2020-09-10 (×5): 10 meq via INTRAVENOUS
  Filled 2020-09-10 (×4): qty 100

## 2020-09-10 NOTE — Progress Notes (Signed)
[facility] Progress Note     Subjective: Spit in Ng canister/ no bilious output. Xray improved.   Objective: Vital signs in last 24 hours: Temp:  [97.6 F (36.4 C)-99.5 F (37.5 C)] 98.9 F (37.2 C) (04/04 0917) Pulse Rate:  [75-106] 106 (04/04 0917) Resp:  [16-18] 16 (04/04 0917) BP: (133-146)/(77-92) 146/84 (04/04 0917) SpO2:  [99 %-100 %] 100 % (04/04 0917) Last BM Date: 09/08/20  Intake/Output from previous day: 04/03 0701 - 04/04 0700 In: -  Out: 1750 [Urine:1250] Intake/Output this shift: No intake/output data recorded.  General appearance: alert and no distress GI: soft, minimally distended, non tender  Lab Results:  Recent Labs    09/09/20 0712 09/10/20 0621  WBC 8.4 8.2  HGB 9.4* 8.7*  HCT 29.7* 28.2*  PLT 362 371   BMET Recent Labs    09/09/20 0712 09/10/20 0621  NA 142 145  K 2.7* 3.1*  CL 97* 100  CO2 34* 27  GLUCOSE 94 120*  BUN 18 22  CREATININE 0.95 1.21  CALCIUM 10.8* 10.8*   PT/INR No results for input(s): LABPROT, INR in the last 72 hours.  Studies/Results: DG Abd 1 View  Result Date: 09/09/2020 CLINICAL DATA:  Gastric distension EXAM: ABDOMEN - 1 VIEW COMPARISON:  September 08, 2020 FINDINGS: Nasogastric tube is seen with its distal tip noted along the expected region of the gastric fundus. Air is seen throughout the large and small bowel with mildly dilated small bowel loops seen within the left lower quadrant. No radio-opaque calculi or other significant radiographic abnormality are seen. 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 IMPRESSION: 1. Nasogastric tube positioning, as described above. 2. Additional findings suggestive of an early partial small bowel obstruction versus ileus. Electronically Signed   By: Virgina Norfolk M.D.   On: 09/09/2020 18:31   DG Abd Portable 2V  Result Date: 09/10/2020 CLINICAL DATA:  Chronic constipation and bowel obstruction EXAM: PORTABLE ABDOMEN - 2 VIEW COMPARISON:  September 09, 2020  FINDINGS: Supine and upright images obtained. Nasogastric tube tip and side port in stomach. There is moderate stool in the colon. There is mild transverse colonic dilatation with several loops of small bowel upper normal in size. No air-fluid levels. No free air evident. Atelectatic change noted in right lung base. IMPRESSION: Nasogastric tube tip and side port in stomach. Moderate stool in colon. Borderline prominence of bowel loops potentially could indicate enteritis or ileus. Bowel obstruction not felt to be likely. No free air. Atelectatic change right lung base. Electronically Signed   By: Lowella Grip III M.D.   On: 09/10/2020 12:21    Anti-infectives: Anti-infectives (From admission, onward)   Start     Dose/Rate Route Frequency Ordered Stop   09/08/20 0200  Ampicillin-Sulbactam (UNASYN) 3 g in sodium chloride 0.9 % 100 mL IVPB        3 g 200 mL/hr over 30 Minutes Intravenous Every 8 hours 09/08/20 0122        Assessment/Plan: Mr. Seelman is a 70 yo with duodenal mass and Xray findings concerning for possible pSBO versus ileus and ? Internal hernia on CT. Was very constipated and I did not appreciate any signs of internal hernia.  NG dc and start diet adv as tolerated GI managing duodenal mass Palliative care should be involved given patient's dementia, multiple medical issues and now duodenal mass    LOS: 2 days    Virl Cagey 09/10/2020

## 2020-09-10 NOTE — Progress Notes (Signed)
Subjective: Unable to provide much information. Denies abdominal pain. No overt GI bleeding.   Objective: Vital signs in last 24 hours: Temp:  [97.6 F (36.4 C)-99.5 F (37.5 C)] 98.9 F (37.2 C) (04/04 0917) Pulse Rate:  [75-106] 106 (04/04 0917) Resp:  [16-18] 16 (04/04 0917) BP: (133-146)/(74-92) 146/84 (04/04 0917) SpO2:  [99 %-100 %] 100 % (04/04 0917) Last BM Date: 09/08/20 General:   Alert and oriented to person only, NG tube in place Head:  Normocephalic and atraumatic. Abdomen:  Bowel sounds present, soft, non-tender, non-distended. No HSM or hernias noted.  Extremities:  Without  edema. Neurologic:  Alert to person, unable to assess orientation   Intake/Output from previous day: 04/03 0701 - 04/04 0700 In: -  Out: 1750 [Urine:1250] Intake/Output this shift: No intake/output data recorded.  Lab Results: Recent Labs    09/08/20 0603 09/09/20 0712 09/10/20 0621  WBC 11.0* 8.4 8.2  HGB 9.7* 9.4* 8.7*  HCT 30.1* 29.7* 28.2*  PLT 339 362 371   BMET Recent Labs    09/08/20 0603 09/09/20 0712 09/10/20 0621  NA 136 142 145  K 3.9 2.7* 3.1*  CL 90* 97* 100  CO2 34* 34* 27  GLUCOSE 199* 94 120*  BUN 27* 18 22  CREATININE 0.94 0.95 1.21  CALCIUM 11.3* 10.8* 10.8*   LFT Recent Labs    09/08/20 0603 09/09/20 0712 09/10/20 0621  PROT 6.2* 5.9* 5.8*  ALBUMIN 2.6* 2.5* 2.5*  AST 21 13* 10*  ALT 41 29 21  ALKPHOS 103 89 84  BILITOT 0.5 0.6 1.3*    Studies/Results: DG Abd 1 View  Result Date: 09/09/2020 CLINICAL DATA:  Gastric distension EXAM: ABDOMEN - 1 VIEW COMPARISON:  September 08, 2020 FINDINGS: Nasogastric tube is seen with its distal tip noted along the expected region of the gastric fundus. Air is seen throughout the large and small bowel with mildly dilated small bowel loops seen within the left lower quadrant. No radio-opaque calculi or other significant radiographic abnormality are seen. 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 IMPRESSION: 1.  Nasogastric tube positioning, as described above. 2. Additional findings suggestive of an early partial small bowel obstruction versus ileus. Electronically Signed   By: Virgina Norfolk M.D.   On: 09/09/2020 18:31    Assessment: 70 year old male with history of diabetes, hyperlipidemia, hypertension, cognitive impairment, history of duodenal mass, gastric ulcers,CVA,who was admitted to the hospital after being found to have a DVT at [facility], and subsequently found to have interval marked circumferential wall thickening of distal esophagus, marked fluid distension of stomach without SBO, query functional obstruction, generalized distension of colon with large stool burden, possible internal hernia but Surgery following and felt not to have this.   Clinically, his abdominal exam is benign; nursing staff state no BM today but receiving tap water enemas. Remains with NG tube. Hopefully, can trial clear liquids in near future. Awaiting abdominal xray today.   Duodenal mass: keep plans for office visit 09/14/20 with Dr. Rush Landmark. Needs EGD as well for ulcer healing. Timing to be determined with Dr. Rush Landmark especially in light of recent DVT and anticoagulation.   Normocytic anemia: no overt GI bleeding. Known PUD, duodenal mass. Slight decrease sine admission but similar to Jan 2022.   Plan: Await Xray Continue PPI BID Start Miralax once taking in orally Keep outpatient appt upcoming 4/8 with Dr. Rush Landmark Continue tap water enemas for now    Annitta Needs, PhD, ANP-BC [facility] Gastroenterology  LOS: 2 days    09/10/2020, 9:57 AM

## 2020-09-10 NOTE — NC FL2 (Signed)
[place] LEVEL OF CARE SCREENING TOOL     IDENTIFICATION  Patient Name: Daniel [name] Birthdate: 09/03/1950 Sex: male Admission Date (Current Location): 09/07/2020  [place] and [place] Number:  [place] [facility] [id number] [place] and Address:  [facility] [address], [place]      Provider Number: [id number]  Attending Physician Name and Address:  Murlean Iba, MD  Relative Name and Phone Number:       Current Level of Care: Hospital Recommended Level of Care: [place] Prior Approval Number:    Date Approved/Denied:   PASRR Number:    Discharge Plan: SNF    Current Diagnoses: Patient Active Problem List   Diagnosis Date Noted  . Gastric distention   . Abdominal distention 09/08/2020  . Aspiration pneumonia ([place]) 09/08/2020  . Hypercalcemia 09/08/2020  . Multiple gastric ulcers 07/24/2020  . Esophagitis 07/24/2020  . 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 Normochromic normocytic anemia   . Upper abdominal pain   . Abnormal CT scan, esophagus   . Abnormal CT scan, stomach   . Pancreatic abnormality   . Constipation   . Hyperglycemia   . History of stroke   . Hypokalemia   . Hypomagnesemia   . AKI (acute kidney injury) ([place]) 06/06/2020  . Acute CVA (cerebrovascular accident) ([place]) 01/05/2020  . Right knee pain 01/01/2017  . Osteoarthritis of right knee 01/01/2017  . Mental impairment 09/23/2016  . Hypertension 09/23/2016  . Type 2 diabetes mellitus without complication, without long-term current use of insulin ([place]) 09/23/2016  . BPH (benign prostatic hyperplasia) 09/23/2016  . Dementia ([place]) 09/23/2016  . 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 See  d/c summary for updates.)  AMBULATORY STATUS COMMUNICATION OF NEEDS Skin   Extensive Assist Verbally PU Stage and Appropriate Care                       Personal Care Assistance Level of Assistance  Bathing,Feeding,Dressing Bathing Assistance: Limited assistance Feeding assistance: Limited assistance Dressing Assistance: Limited assistance     Functional Limitations Info  Sight,Hearing,Speech Sight Info: Impaired Hearing Info: Adequate Speech Info: Adequate    SPECIAL CARE FACTORS FREQUENCY                       Contractures Contractures Info: Not present    Additional Factors Info  Code Status,Allergies,Insulin Sliding Scale,Psychotropic     Psychotropic Info: Trazodone         Current Medications (09/10/2020):  This is the current hospital active medication list Current Facility-Administered Medications  Medication Dose Route Frequency Provider Last Rate Last Admin  . 0.9 %  sodium chloride infusion   Intravenous Continuous Zierle-Ghosh, Asia B, DO 75 mL/hr at 09/10/20 0219 New Bag at 09/10/20 0219  . acetaminophen (TYLENOL) tablet 650 mg  650 mg Oral Q6H PRN Zierle-Ghosh, Asia B, DO       Or  . acetaminophen (TYLENOL) suppository 650 mg  650 mg Rectal Q6H PRN Zierle-Ghosh, Asia B, DO      . Ampicillin-Sulbactam (UNASYN) 3 g in sodium chloride 0.9 % 100 mL IVPB  3 g Intravenous Q8H Zierle-Ghosh, Asia B, DO 200 mL/hr  at 09/10/20 1258 3 g at 09/10/20 1258  . Chlorhexidine Gluconate Cloth 2 % PADS 6 each  6 each Topical Daily Wynetta Emery, Clanford L, MD   6 each at 09/10/20 1151  . enoxaparin (LOVENOX) injection 60 mg  1 mg/kg Subcutaneous Q12H Johnson, Clanford L, MD   60 mg at 09/10/20 1244  . insulin aspart (novoLOG) injection 0-9 Units  0-9 Units Subcutaneous TID WC Johnson, Clanford L, MD      . metoprolol tartrate (LOPRESSOR) injection 7.5 mg  7.5 mg Intravenous Q6H Johnson, Clanford L, MD   7.5 mg at 09/10/20 1245  . morphine 2 MG/ML injection 2 mg  2 mg  Intravenous Q4H PRN Johnson, Clanford L, MD      . ondansetron (ZOFRAN) tablet 4 mg  4 mg Oral Q6H PRN Zierle-Ghosh, Asia B, DO       Or  . ondansetron (ZOFRAN) injection 4 mg  4 mg Intravenous Q6H PRN Zierle-Ghosh, Asia B, DO      . pantoprazole (PROTONIX) injection 40 mg  40 mg Intravenous Q12H Johnson, Clanford L, MD   40 mg at 09/10/20 0919  . polyethylene glycol (MIRALAX / GLYCOLAX) packet 17 g  17 g Oral Daily Annitta Needs, NP         Discharge Medications: Please see discharge summary for a list of discharge medications.  Relevant Imaging Results:  Relevant Lab Results:   Additional Information Pt SSN: [id number]  Salome Arnt, LCSW

## 2020-09-10 NOTE — Progress Notes (Signed)
PROGRESS NOTE   Daniel [name]  JTT:[id number] DOB: [birth year] DOA: 09/07/2020 PCP: Leonie Douglas, MD   Chief Complaint  Patient presents with  . Medical Clearance    Possible DVT Per [facility] staff   Level of care: Telemetry  Brief Admission History:  70 y.o. male,with history of HTN, HLD, and DMII presents to the ED with a chief complaint of DVT. Patient is a ward of the state from the [facility]. Unfortunately he is not able to provide much history. He answers most questions with "I don't know." From report, and chart review, it seems that an [place] of right lower extremity for DVT was done at the facility. The result revealed a DVT. Patient was then transported to the ED. Upon arrival - patient also had significant abdominal distention. Patient denied abdominal pain at arrival, and still denies abdominal pain now. Workup revealed a bowel obstruction. NG tube was placed, and within a few minutes had 1 L of dark brown/black output. Rectal occult blood test was negative. Gastric contents occult blood is pending. Patient is afebrile without leukocytosis. He has had a recent admission for GI bleed - hemoglobin is stable at 10.6. Chemistry panel reveals a hyperglycemia at 284, alk phos 128, albumin 3.1, AST/ALT 39/56. CT abdomen shows esophagitis, distended stomach indicative of functional bowel obstruction. It also reveals pneumonia vs aspiration, and absent left kidney.  EKG reveals sinus tach with a heart rate 124, QTc 464.  Given the DVT patient was started on heparin drip per pharmacy consult.  Dr. Constance Haw was consulted and advised NG tube and she will see in the morning.  GI was consulted and also reports they will see in a.m.  Patient was given 2 L of fluid, Zofran 4 mg, and Protonix.  Assessment & Plan:   Principal Problem:   Abdominal distention Active Problems:   Type 2 diabetes mellitus without complication, without long-term current use of insulin (HCC)   Hypomagnesemia    Aspiration pneumonia (HCC)   Hypercalcemia   Gastric distention  Severe constipation with partial bowel obstruction - continue NG to suction, slowly start diet today, advanced to full liquid, continue IV protonix, continue tap water enemas TID until he is adequately evacuated.  He has evacuated large amount of stool but xray still shows significant stool burden.   Duodenal mass - GI following, defer endoscopy until seen by Dr. 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  Normal saline infusing.  Recheck labs in AM.   Hypomagnesemia - corrected calcium is 12.0.  IV replacement ordered (4gm), repeat in AM.   Hypokalemia - IV replacement ordered, also repleting the potassium.    Aspiration pneumonia -IV amp/sulbactam ordered.   Type 2 DM - Pt is NPO, CBG testing every 4 hours with SSI coverage as needed.   Goals of care: Requesting  palliative consultation for goals of care.  Pt has a legal guardian.  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    RLE DVT -   Full dose enoxaparin per pharm D ordered. Hopefully can start oral DOAC on 4/5.    DVT prophylaxis: enoxaparin Code Status: currently full, pending palliative discussions Family Communication: has a legal guardian, unable to reach today Disposition:  Status is: Inpatient  Remains inpatient appropriate because:IV treatments appropriate due to intensity of illness or inability to take PO and  Inpatient level of care appropriate due to severity of illness  Dispo: The patient is from: SNF              Anticipated d/c is to: SNF              Patient currently is not medically stable to d/c.   Difficult to place patient No  Consultants:   GI  Surgery   Procedures:     Antimicrobials:  augmentin 4/3>>   Subjective: Pt wanting to eat.    Objective: Vitals:    09/10/20 0519 09/10/20 0528 09/10/20 0917 09/10/20 1243  BP: (!) 145/92 136/83 (!) 146/84 (!) 142/82  Pulse: 90 99 (!) 106 (!) 121  Resp: 18 18 16    Temp: 97.8 F (36.6 C) 98.9 F (37.2 C) 98.9 F (37.2 C)   TempSrc:  Oral Oral   SpO2: 100% 100% 100%   Weight:      Height:        Intake/Output Summary (Last 24 hours) at 09/10/2020 1802 Last data filed at 09/10/2020 1700 Gross per 24 hour  Intake 3388.68 ml  Output 1400 ml  Net 1988.68 ml   Filed Weights   09/07/20 1944 09/08/20 0246 09/09/20 0419  Weight: 68 kg 66 kg 60 kg    Examination:  General exam: somnolent, chronically ill appearing, Appears calm and comfortable  Respiratory system: Clear to auscultation. Respiratory effort normal. Cardiovascular system: tachycardic rate, normal S1 & S2 heard. No JVD, murmurs, rubs, gallops or clicks. No pedal edema. Gastrointestinal system: Abdomen is nondistended, soft. No organomegaly or masses felt. Normal bowel sounds heard. Central nervous system: somnolent but arousable. No focal neurological deficits. Extremities: Symmetric 5 x 5 power. Skin: No rashes, lesions or ulcers.  Psychiatry: Judgement and insight appear poor. Mood & affect flat.   Data Reviewed: I have personally reviewed following labs and imaging studies  CBC: Recent Labs  Lab 09/07/20 2000 09/08/20 0603 09/09/20 0712 09/10/20 0621  WBC 8.0 11.0* 8.4 8.2  NEUTROABS 6.9  --   --   --   HGB 10.6* 9.7* 9.4* 8.7*  HCT 32.5* 30.1* 29.7* 28.2*  MCV 84.4 85.0 85.3 87.6  PLT 390 339 362 544    Basic Metabolic Panel: Recent Labs  Lab 09/07/20 2000 09/08/20 0603 09/09/20 0712 09/10/20 0621  NA 133* 136 142 145  K 3.8 3.9 2.7* 3.1*  CL 87* 90* 97* 100  CO2 31 34* 34* 27  GLUCOSE 284* 199* 94 120*  BUN 27* 27* 18 22  CREATININE 0.93 0.94 0.95 1.21  CALCIUM 11.8* 11.3* 10.8* 10.8*  MG 1.1* 1.6* 1.3* 1.6*    GFR: Estimated Creatinine Clearance: 50.3 mL/min (by C-G formula based on SCr of 1.21  mg/dL).  Liver Function Tests: Recent Labs  Lab 09/07/20 2000 09/08/20 0603 09/09/20 0712 09/10/20 0621  AST 39 21 13* 10*  ALT 56* 41 29 21  ALKPHOS 128* 103 89 84  BILITOT 0.5 0.5 0.6 1.3*  PROT 7.5 6.2* 5.9* 5.8*  ALBUMIN 3.1* 2.6* 2.5* 2.5*    CBG: Recent Labs  Lab 09/09/20 1625 09/09/20 2048 09/10/20 0159 09/10/20 0928 09/10/20 1127  GLUCAP 86 111* 109* 117* 123*    Recent Results (from the past 240 hour(s))  Resp Panel by RT-PCR (Flu A&B, Covid) Nasopharyngeal Swab     Status: None   Collection Time: 09/08/20  1:10 AM   Specimen: Nasopharyngeal Swab; Nasopharyngeal(NP) swabs in vial transport medium  Result Value Ref Range Status  SARS Coronavirus 2 by RT PCR NEGATIVE NEGATIVE Final    Comment: (NOTE) SARS-CoV-2 target nucleic acids are NOT DETECTED.  The SARS-CoV-2 RNA is generally detectable in upper respiratory specimens during the acute phase of infection. The lowest concentration of SARS-CoV-2 viral copies this assay can detect is 138 copies/mL. A negative result does not preclude SARS-Cov-2 infection and should not be used as the sole basis for treatment or other patient management decisions. A negative result may occur with  improper specimen collection/handling, submission of specimen other than nasopharyngeal swab, presence of viral mutation(s) within the areas targeted by this assay, and inadequate number of viral copies(<138 copies/mL). A negative result must be combined with clinical observations, patient history, and epidemiological information. The expected result is Negative.  Fact Sheet for Patients:  EntrepreneurPulse.com.au  Fact Sheet for Healthcare Providers:  IncredibleEmployment.be  This test is no t yet approved or cleared by the [place] FDA and  has been authorized for detection and/or diagnosis of SARS-CoV-2 by FDA under an Emergency Use Authorization (EUA). This EUA will remain  in  effect (meaning this test can be used) for the duration of the COVID-19 declaration under Section 564(b)(1) of the Act, 21 U.S.C.section 360bbb-3(b)(1), unless the authorization is terminated  or revoked sooner.       Influenza A by PCR NEGATIVE NEGATIVE Final   Influenza B by PCR NEGATIVE NEGATIVE Final    Comment: (NOTE) The Xpert Xpress SARS-CoV-2/FLU/RSV plus assay is intended as an aid in the diagnosis of influenza from Nasopharyngeal swab specimens and should not be used as a sole basis for treatment. Nasal washings and aspirates are unacceptable for Xpert Xpress SARS-CoV-2/FLU/RSV testing.  Fact Sheet for Patients: EntrepreneurPulse.com.au  Fact Sheet for Healthcare Providers: IncredibleEmployment.be  This test is not yet approved or cleared by the [place] FDA and has been authorized for detection and/or diagnosis of SARS-CoV-2 by FDA under an Emergency Use Authorization (EUA). This EUA will remain in effect (meaning this test can be used) for the duration of the COVID-19 declaration under Section 564(b)(1) of the Act, 21 U.S.C. section 360bbb-3(b)(1), unless the authorization is terminated or revoked.  Performed at [facility], [address], [place], [place] [address]   MRSA PCR Screening     Status: None   Collection Time: 09/08/20  2:26 AM   Specimen: Nasal Mucosa; Nasopharyngeal  Result Value Ref Range Status   MRSA by PCR NEGATIVE NEGATIVE Final    Comment:        The GeneXpert MRSA Assay (FDA approved for NASAL specimens only), is one component of a comprehensive MRSA colonization surveillance program. It is not intended to diagnose MRSA infection nor to guide or monitor treatment for MRSA infections. Performed at [facility], [address] [place], [place] [address]      Radiology Studies: DG Abd 1 View  Result Date: 09/09/2020 CLINICAL DATA:  Gastric distension EXAM: ABDOMEN - 1 VIEW COMPARISON:  September 08, 2020 FINDINGS: Nasogastric tube is seen with its distal tip noted along the expected region of the gastric fundus. Air is seen throughout the large and small bowel with mildly dilated small bowel loops seen within the left lower quadrant. No radio-opaque calculi or other significant radiographic abnormality are seen. 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 IMPRESSION: 1. Nasogastric tube positioning, as described above. 2. Additional findings suggestive of an early partial small bowel obstruction versus ileus. Electronically Signed   By: Virgina Norfolk M.D.   On: 09/09/2020 18:31  DG Abd Portable 2V  Result Date: 09/10/2020 CLINICAL DATA:  Chronic constipation and bowel obstruction EXAM: PORTABLE ABDOMEN - 2 VIEW COMPARISON:  September 09, 2020 FINDINGS: Supine and upright images obtained. Nasogastric tube tip and side port in stomach. There is moderate stool in the colon. There is mild transverse colonic dilatation with several loops of small bowel upper normal in size. No air-fluid levels. No free air evident. Atelectatic change noted in right lung base. IMPRESSION: Nasogastric tube tip and side port in stomach. Moderate stool in colon. Borderline prominence of bowel loops potentially could indicate enteritis or ileus. Bowel obstruction not felt to be likely. No free air. Atelectatic change right lung base. Electronically Signed   By: Lowella Grip III M.D.   On: 09/10/2020 12:21   Scheduled Meds: . Chlorhexidine Gluconate Cloth  6 each Topical Daily  . enoxaparin (LOVENOX) injection  1 mg/kg Subcutaneous Q12H  . insulin aspart  0-9 Units Subcutaneous TID WC  . metoprolol tartrate  7.5 mg Intravenous Q6H  . pantoprazole (PROTONIX) IV  40 mg Intravenous Q12H  . polyethylene glycol  17 g Oral Daily   Continuous Infusions: . sodium chloride Stopped (09/10/20 1247)  . ampicillin-sulbactam (UNASYN) IV 3 g (09/10/20 1726)    LOS: 2 days   Time spent: 55 mins   Daniel Arnott Wynetta Emery, MD How to contact  the [facility] Attending or Consulting provider [place] or covering provider during after hours [place], for this patient?  1. Check the care team in [facility] and look for a) attending/consulting TRH provider listed and b) the [facility] team listed 2. Log into [email or web address] and use [place] universal password to access. If you do not have the password, please contact the hospital operator. 3. Locate the [facility] provider you are looking for under [facility] and page to a number that you can be directly reached. 4. If you still have difficulty reaching the provider, please page the [facility] (Director on Call) for the Hospitalists listed on amion for assistance.  09/10/2020, 6:02 PM

## 2020-09-10 NOTE — TOC Initial Note (Signed)
Transition of Care [facility]) - Initial/Assessment Note    Patient Details  Name: Daniel [name] MRN: [id number] Date of Birth: 1950-07-25  [facility] CM/SW Contact:    Salome Arnt, [place] Phone Number: 09/10/2020, 2:56 PM  Clinical Narrative:  Pt admitted due to abdominal distention. Pt is long term resident at [facility]. LCSW spoke with Ebony Hail at [facility] and [name], legal guardian. Plan is to return to [facility] when medically stable. TOC will continue to follow.                    Expected Discharge Plan: Skilled Nursing Facility Barriers to Discharge: Continued Medical Work up   Patient Goals and CMS Choice Patient states their goals for this hospitalization and ongoing recovery are:: return to SNF      Expected Discharge Plan and Services Expected Discharge Plan: [place] In-house Referral: Clinical Social Work   Post Acute Care Choice: Nursing Home Living arrangements for the past 2 months: [place]                 DME Arranged: N/A DME Agency: NA                  Prior Living Arrangements/Services Living arrangements for the past 2 months: [place] Lives with:: Facility Resident Patient language and need for interpreter reviewed:: Yes Do you feel safe going back to the place where you live?: Yes        Care giver support system in place?: Yes (comment)   Criminal Activity/Legal Involvement Pertinent to Current Situation/Hospitalization: No - Comment as needed  Activities of Daily Living Home Assistive Devices/Equipment: None ADL Screening (condition at time of admission) Patient's cognitive ability adequate to safely complete daily activities?: No Is the patient deaf or have difficulty hearing?: No Does the patient have difficulty seeing, even when wearing glasses/contacts?: Yes Does the patient have difficulty concentrating, remembering, or making decisions?: Yes Patient  able to express need for assistance with ADLs?: No Does the patient have difficulty dressing or bathing?: Yes Independently performs ADLs?: No Communication: Needs assistance Is this a change from baseline?: Pre-admission baseline Dressing (OT): Needs assistance Is this a change from baseline?: Pre-admission baseline Grooming: Needs assistance Is this a change from baseline?: Pre-admission baseline Feeding: Needs assistance Is this a change from baseline?: Pre-admission baseline Bathing: Needs assistance Is this a change from baseline?: Pre-admission baseline Toileting: Needs assistance Is this a change from baseline?: Pre-admission baseline In/Out Bed: Needs assistance Is this a change from baseline?: Pre-admission baseline Walks in Home: Needs assistance Is this a change from baseline?: Pre-admission baseline Does the patient have difficulty walking or climbing stairs?: Yes Weakness of Legs: Left Weakness of Arms/Hands: Left  Permission Sought/Granted                  Emotional Assessment   Attitude/Demeanor/Rapport: Unable to Assess Affect (typically observed): Unable to Assess   Alcohol / Substance Use: Not Applicable Psych Involvement: No (comment)  Admission diagnosis:  DVT (deep vein thrombosis) in pregnancy [O22.30] Abdominal pain [R10.9] Patient Active Problem List   Diagnosis Date Noted  . Gastric distention   . Abdominal distention 09/08/2020  . Aspiration pneumonia ([place]) 09/08/2020  . Hypercalcemia 09/08/2020  . Multiple gastric ulcers 07/24/2020  . Esophagitis 07/24/2020  . 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 Normochromic normocytic anemia   . Upper abdominal pain   . Abnormal CT scan, esophagus   .  Abnormal CT scan, stomach   . Pancreatic abnormality   . Constipation   . Hyperglycemia   . History of stroke   . Hypokalemia   . Hypomagnesemia   . AKI (acute kidney injury) ([place]) 06/06/2020  . Acute CVA (cerebrovascular accident) ([place]) 01/05/2020  .  Right knee pain 01/01/2017  . Osteoarthritis of right knee 01/01/2017  . Mental impairment 09/23/2016  . Hypertension 09/23/2016  . Type 2 diabetes mellitus without complication, without long-term current use of insulin ([place]) 09/23/2016  . BPH (benign prostatic hyperplasia) 09/23/2016  . Dementia ([place]) 09/23/2016  . 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 [place] [address] Phone: [phone] Fax: [phone]     Social Determinants of Health (SDOH) Interventions    Readmission Risk Interventions Readmission Risk Prevention Plan 09/10/2020 06/21/2020 06/08/2020  Transportation Screening Complete Complete Complete  PCP or Specialist Appt within 5-7 Days - - Complete  Home Care Screening - - Complete  Medication Review (RN CM) - - Complete  HRI or Home Care Consult Complete Complete -  Social Work Consult for [place] Planning/Counseling Complete Complete -  Palliative Care Screening - Not Applicable -  Medication Review Press photographer) Complete Complete -  Some recent data might be hidden

## 2020-09-11 ENCOUNTER — Telehealth: Payer: Self-pay | Admitting: Gastroenterology

## 2020-09-11 DIAGNOSIS — Z515 Encounter for palliative care: Secondary | ICD-10-CM

## 2020-09-11 DIAGNOSIS — R140 Abdominal distension (gaseous): Secondary | ICD-10-CM | POA: Diagnosis not present

## 2020-09-11 DIAGNOSIS — I82409 Acute embolism and thrombosis of unspecified deep veins of unspecified lower extremity: Secondary | ICD-10-CM

## 2020-09-11 DIAGNOSIS — K5909 Other constipation: Secondary | ICD-10-CM | POA: Diagnosis not present

## 2020-09-11 DIAGNOSIS — Z7189 Other specified counseling: Secondary | ICD-10-CM

## 2020-09-11 DIAGNOSIS — O2230 Deep phlebothrombosis in pregnancy, unspecified trimester: Principal | ICD-10-CM

## 2020-09-11 DIAGNOSIS — K56600 Partial intestinal obstruction, unspecified as to cause: Secondary | ICD-10-CM

## 2020-09-11 DIAGNOSIS — R627 Adult failure to thrive: Secondary | ICD-10-CM

## 2020-09-11 DIAGNOSIS — J690 Pneumonitis due to inhalation of food and vomit: Secondary | ICD-10-CM | POA: Diagnosis not present

## 2020-09-11 DIAGNOSIS — K3189 Other diseases of stomach and duodenum: Secondary | ICD-10-CM | POA: Diagnosis not present

## 2020-09-11 DIAGNOSIS — D649 Anemia, unspecified: Secondary | ICD-10-CM

## 2020-09-11 LAB — COMPREHENSIVE METABOLIC PANEL
ALT: 17 U/L (ref 0–44)
AST: 9 U/L — ABNORMAL LOW (ref 15–41)
Albumin: 2.4 g/dL — ABNORMAL LOW (ref 3.5–5.0)
Alkaline Phosphatase: 74 U/L (ref 38–126)
Anion gap: 9 (ref 5–15)
BUN: 17 mg/dL (ref 8–23)
CO2: 31 mmol/L (ref 22–32)
Calcium: 9.5 mg/dL (ref 8.9–10.3)
Chloride: 100 mmol/L (ref 98–111)
Creatinine, Ser: 1 mg/dL (ref 0.61–1.24)
GFR, Estimated: >60 mL/min (ref >60)
Glucose, Bld: 171 mg/dL — ABNORMAL HIGH (ref 70–99)
Potassium: 3.3 mmol/L — ABNORMAL LOW (ref 3.5–5.1)
Sodium: 140 mmol/L (ref 135–145)
Total Bilirubin: 0.6 mg/dL (ref 0.3–1.2)
Total Protein: 5.4 g/dL — ABNORMAL LOW (ref 6.5–8.1)

## 2020-09-11 LAB — CBC
HCT: 25.7 % — ABNORMAL LOW (ref 39.0–52.0)
Hemoglobin: 8.3 g/dL — ABNORMAL LOW (ref 13.0–17.0)
MCH: 27.4 pg (ref 26.0–34.0)
MCHC: 32.3 g/dL (ref 30.0–36.0)
MCV: 84.8 fL (ref 80.0–100.0)
Platelets: 329 10*3/uL (ref 150–400)
RBC: 3.03 MIL/uL — ABNORMAL LOW (ref 4.22–5.81)
RDW: 16.6 % — ABNORMAL HIGH (ref 11.5–15.5)
WBC: 5.1 10*3/uL (ref 4.0–10.5)
nRBC: 0 % (ref 0.0–0.2)

## 2020-09-11 LAB — GLUCOSE, CAPILLARY
Glucose-Capillary: 134 mg/dL — ABNORMAL HIGH (ref 70–99)
Glucose-Capillary: 145 mg/dL — ABNORMAL HIGH (ref 70–99)
Glucose-Capillary: 260 mg/dL — ABNORMAL HIGH (ref 70–99)
Glucose-Capillary: 301 mg/dL — ABNORMAL HIGH (ref 70–99)

## 2020-09-11 LAB — MAGNESIUM: Magnesium: 1.5 mg/dL — ABNORMAL LOW (ref 1.7–2.4)

## 2020-09-11 MED ORDER — POTASSIUM CHLORIDE 10 MEQ/100ML IV SOLN
10.0000 meq | INTRAVENOUS | Status: AC
  Administered 2020-09-11 – 2020-09-12 (×4): 10 meq via INTRAVENOUS
  Filled 2020-09-11: qty 100

## 2020-09-11 MED ORDER — POTASSIUM CHLORIDE 10 MEQ/100ML IV SOLN
10.0000 meq | INTRAVENOUS | Status: DC
  Filled 2020-09-11 (×3): qty 100

## 2020-09-11 MED ORDER — MAGNESIUM SULFATE 4 GM/100ML IV SOLN
4.0000 g | Freq: Once | INTRAVENOUS | Status: AC
  Administered 2020-09-11: 4 g via INTRAVENOUS
  Filled 2020-09-11: qty 100

## 2020-09-11 MED ORDER — POLYETHYLENE GLYCOL 3350 17 G PO PACK
17.0000 g | PACK | Freq: Three times a day (TID) | ORAL | Status: DC
  Administered 2020-09-11 – 2020-09-12 (×4): 17 g via ORAL
  Filled 2020-09-11 (×4): qty 1

## 2020-09-11 MED ORDER — POLYETHYLENE GLYCOL 3350 17 G PO PACK
17.0000 g | PACK | Freq: Two times a day (BID) | ORAL | Status: DC
  Administered 2020-09-11: 17 g via ORAL

## 2020-09-11 NOTE — Consult Note (Signed)
Consultation Note Date: 09/11/2020   Patient Name: Daniel [name]  DOB: Mar 28, 1951  MRN: [id number]  Age / Sex: 70 y.o., male  PCP: Leonie Douglas, MD Referring Physician: Murlean Iba, MD  Reason for Consultation: Establishing goals of care  HPI/Patient Profile: 70 y.o. 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. Also found to have significant abdominal distension related to bowel obstruction? 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 Also with aspiration pneumonia. Noted to not be a candidate for chemotherapy per Dr. Delton Coombes 08/30/20 note. Following with GI Dr. Rush Landmark outpatient with discussion for possible resection via EGD in ~May/June allowing time for ulcers to heal prior to intervention.   Clinical Assessment and Goals of Care: I met today at [facility] bedside but no visitors present. He is awake and alert and able to converse with me although with delayed responses. He does not have good understanding or recall of his medical situation. He denies pain, discomfort, nausea and is asking for more drink and food. RN reports he is eating and drinking full liquid diet well with good intake and no nausea/vomiting.   I called and spoke with legal guardian, [name]. [name] shares that "Daniel [name]" has been under her guardianship just since September of last year after a stroke. Prior to this he was living in his group home and would cook his own breakfast every morning and make coffee for everyone in the group home. He is very social and gregarious and active in the community. He helped with meals on wheels and enjoyed being around people. [name] has known Daniel [name] for many  years as she has had other clients at [employer] group home. [name] shares that it has been difficult to watch him decline and languish. He has lost ~40 lbs over the past months and now wheelchair bound. He no longer recalls her and people he has known for years. [name] has been frustrated by his continued decline and a helplessness of not knowing how to help him to improve. They have been awaiting GI appointment in [facility] scheduled for Friday although [name] shares there are no plans for EGD or biopsy at that time as far as she knows. [name] expresses frustration with previous EGD in which she thought that duodenal lesion/mass would be biopsied but has not. She knows that there is little that can be done to develop treatment plan without biopsy. We worry that the longer we wait the more risky these procedures and interventions become for Daniel [name] and the more likely he is to have further complications. At the same time we are also able to acknowledge that we want to be sure that everything we put Daniel [name] through is thought to be beneficial to him and his quality of life as there may be a point where pursuing aggressive interventions for Daniel [name] may not be  in his best interest. [name] is hopeful to continue to work with GI and oncology to develop a good plan for Daniel [name] and discuss risks vs benefits after biopsy and more information is known about the extent of what we are dealing with for Daniel [name].   All questions/concerns addressed. Emotional support provided.   Primary Decision Maker LEGAL GUARDIAN [name] Price    SUMMARY OF RECOMMENDATIONS   - Hopeful to pursue biopsy and work up of duodenal mass - Ongoing goals of care based on diagnosis and expectations - Recovering from bowel obstruction  Code Status/Advance Care Planning:  Full code   Symptom Management:   Per GI and attending  Palliative Prophylaxis:   Bowel Regimen, Delirium Protocol, Frequent Pain Assessment, Oral Care and Turn Reposition  Additional  Recommendations (Limitations, Scope, Preferences):  Full Scope Treatment  Psycho-social/Spiritual:   Desire for further Chaplaincy support:no  Prognosis:   Overall prognosis poor with failure to thrive and underlying cancer undergoing work up.   Discharge Planning: To Be Determined      Primary Diagnoses: Present on Admission: . (Resolved) Abdominal pain . Abdominal distention . Aspiration pneumonia ([place]) . Hypomagnesemia . Hypercalcemia   I have reviewed the medical record, interviewed the patient and family, and examined the patient. The following aspects are pertinent.  Past Medical History:  Diagnosis Date  . Diabetes mellitus without complication ([place])   . High cholesterol   . Hypertension    Social History   Socioeconomic History  . Marital status: Single    Spouse name: Not on file  . Number of children: Not on file  . Years of education: Not on file  . Highest education level: Not on file  Occupational History  . Not on file  Tobacco Use  . Smoking status: Never Smoker  . Smokeless tobacco: Never Used  Vaping Use  . Vaping Use: Never used  Substance and Sexual Activity  . Alcohol use: No  . Drug use: No  . Sexual activity: Never  Other Topics Concern  . Not on file  Social History Narrative  . Not on file   Social Determinants of Health   Financial Resource Strain: Not on file  Food Insecurity: Not on file  Transportation Needs: Not on file  Physical Activity: Not on file  Stress: Not on file  Social Connections: Not on file   Family History  Family history unknown: Yes   Scheduled Meds: . Chlorhexidine Gluconate Cloth  6 each Topical Daily  . enoxaparin (LOVENOX) injection  1 mg/kg Subcutaneous Q12H  . insulin aspart  0-9 Units Subcutaneous TID WC  . metoprolol tartrate  7.5 mg Intravenous Q6H  . pantoprazole (PROTONIX) IV  40 mg Intravenous Q12H  . polyethylene glycol  17 g Oral BID   Continuous Infusions: . sodium chloride 50  mL/hr at 09/10/20 2135  . ampicillin-sulbactam (UNASYN) IV 3 g (09/11/20 0218)   PRN Meds:.acetaminophen **OR** acetaminophen, morphine injection, ondansetron **OR** ondansetron (ZOFRAN) IV No Known Allergies Review of Systems  Unable to perform ROS: Dementia  Gastrointestinal: Negative for abdominal distention, abdominal pain, nausea and vomiting.    Physical Exam Vitals and nursing note reviewed.  Constitutional:      General: He is not in acute distress.    Appearance: He is cachectic. He is ill-appearing.  Cardiovascular:     Rate and Rhythm: Normal rate.  Pulmonary:     Effort: Pulmonary effort is normal. No tachypnea, accessory muscle usage or respiratory distress.  Abdominal:  General: Abdomen is flat.     Palpations: Abdomen is soft.     Tenderness: There is no abdominal tenderness.  Neurological:     Mental Status: He is alert.     Comments: Oriented to person and place but not to situation     Vital Signs: BP 135/78 (BP Location: Right Arm)   Pulse 94   Temp 98.6 F (37 C) (Oral)   Resp 16   Ht 6' (1.829 m)   Wt 60 kg   SpO2 100%   BMI 17.94 kg/m  Pain Scale: 0-10 POSS *See Group Information*: 1-Acceptable,Awake and alert Pain Score: 0-No pain   SpO2: SpO2: 100 % O2 Device:SpO2: 100 % O2 Flow Rate: .O2 Flow Rate (L/min): 3 L/min  IO: Intake/output summary:   Intake/Output Summary (Last 24 hours) at 09/11/2020 0928 Last data filed at 09/11/2020 0541 Gross per 24 hour  Intake 3388.68 ml  Output 2400 ml  Net 988.68 ml    LBM: Last BM Date: 09/08/20 Baseline Weight: Weight: 68 kg Most recent weight: Weight: 60 kg     Palliative Assessment/Data:     Time In/Out: 1020-1050, 1600-1640 Time Total: 70 min Greater than 50%  of this time was spent counseling and coordinating care related to the above assessment and plan.  Signed by: Vinie Sill, NP Palliative Medicine Team Pager # [phone] (M-F 8a-5p) Team Phone # [phone]  (Nights/Weekends)

## 2020-09-11 NOTE — Progress Notes (Signed)
PROGRESS NOTE   Daniel [name]  ZOX:[id number] DOB: 07/19/1950 DOA: 09/07/2020 PCP: Leonie Douglas, MD   Chief Complaint  Patient presents with  . Medical Clearance    Possible DVT Per [facility] staff   Level of care: Med-Surg  Brief Admission History:  70 y.o. male,with history of HTN, HLD, and DMII presents to the ED with a chief complaint of DVT. Patient is a ward of the state from the [place] center. Unfortunately he is not able to provide much history. He answers most questions with "I don't know." From report, and chart review, it seems that an [place] of right lower extremity for DVT was done at the facility. The result revealed a DVT. Patient was then transported to the ED. Upon arrival - patient also had significant abdominal distention. Patient denied abdominal pain at arrival, and still denies abdominal pain now. Workup revealed a bowel obstruction. NG tube was placed, and within a few minutes had 1 L of dark brown/black output. Rectal occult blood test was negative. Gastric contents occult blood is pending. Patient is afebrile without leukocytosis. He has had a recent admission for GI bleed - hemoglobin is stable at 10.6. Chemistry panel reveals a hyperglycemia at 284, alk phos 128, albumin 3.1, AST/ALT 39/56. CT abdomen shows esophagitis, distended stomach indicative of functional bowel obstruction. It also reveals pneumonia vs aspiration, and absent left kidney.  EKG reveals sinus tach with a heart rate 124, QTc 464.  Given the DVT patient was started on heparin drip per pharmacy consult.  Dr. Constance Haw was consulted and advised NG tube and she will see in the morning.  GI was consulted and also reports they will see in a.m.  Patient was given 2 L of fluid, Zofran 4 mg, and Protonix.  Assessment & Plan:   Principal Problem:   Abdominal distention Active Problems:   Type 2 diabetes mellitus without complication, without long-term current use of insulin (HCC)   Hypomagnesemia   Anemia    Aspiration pneumonia (HCC)   Hypercalcemia   Gastric distention   Partial bowel obstruction (HCC)  Severe constipation with partial bowel obstruction - continue NG to suction, slowly start diet today, advanced to full liquid, continue IV protonix, continue tap water enemas TID until he is adequately evacuated.  He has evacuated large amount of stool with laxative and tap water enemas.  Duodenal mass - GI following, defer endoscopy until seen by Dr. Rush Landmark outpatient on 09/14/20.   Hypercalcemia - corrected calcium was 12.0.  Elevated calcium likely from malignancy - IV zoledronic acid was given on 4/3 and should expect calcium to start falling in next 24-48 hours.  Normal saline infusing.  Recheck labs in AM.   Hypomagnesemia - IV replacement ordered (4gm), repeat in AM.   Hypokalemia - IV replacement ordered, also repleting the magnesium.    Aspiration pneumonia -IV amp/sulbactam ordered, discontinue after 3-5 days.   Type 2 DM - Pt is tolerating diet, CBG ac/Hs SSI coverage as needed.   Goals of care: Requesting  palliative consultation for goals of care.  Pt has a legal guardian.  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    RLE DVT -   Full dose enoxaparin per pharm D ordered. Had initially planned to start oral DOAC on 4/5. After further consideration, it may not be best idea to start [place] now because he is scheduled to have duodenal biopsy done on 4/8 with Dr. Early Osmond.  I would hold  DOAC for now and could continue lovenox until GI says it is safe to start after duodenal biopsy.  He would need to hold lovenox on day prior to having biopsy done.   DVT prophylaxis: enoxaparin Code Status: currently full, pending palliative discussions Family Communication: has a legal guardian Disposition:  Status is: Inpatient  Remains inpatient appropriate because:IV treatments appropriate due to intensity of illness or inability to take PO and  Inpatient level of care appropriate due to severity of illness  Dispo: The patient is from: SNF              Anticipated d/c is to: SNF              Patient currently is not medically stable to d/c.   Difficult to place patient No  Consultants:   GI  Surgery   Procedures:     Antimicrobials:  Amp sulbactam 4/3>>   Subjective: Pt had large BM today.    Objective: Vitals:   09/11/20 0429 09/11/20 0549 09/11/20 0903 09/11/20 1454  BP: 138/73 139/85 135/78 (!) 144/94  Pulse: 95 83 94 81  Resp: _0 Temp: 98.1 F (36.7 C)  98.6 F (37 C) 98.2 F (36.8 C)  TempSrc:   Oral Oral  SpO2: 100%  100% 100%  Weight:      Height:        Intake/Output Summary (Last 24 hours) at 09/11/2020 1721 Last data filed at 09/11/2020 1300 Gross per 24 hour  Intake 1200 ml  Output 4100 ml  Net -2900 ml   Filed Weights   09/07/20 1944 09/08/20 0246 09/09/20 0419  Weight: 68 kg 66 kg 60 kg    Examination:  General exam: awake, alert, chronically ill appearing, Appears calm and comfortable  Respiratory system: Clear to auscultation. Respiratory effort normal. Cardiovascular system: normal S1 & S2 heard. No JVD, murmurs, rubs, gallops or clicks. No pedal edema. Gastrointestinal system: Abdomen is nondistended, soft. No organomegaly or masses felt. Normal bowel sounds heard. Central nervous system: awake, alert. No focal neurological deficits. Extremities: Symmetric 5 x 5 power. Skin: No rashes, lesions or ulcers.  Psychiatry: Judgement and insight appear poor. Mood & affect flat.   Data Reviewed: I have personally reviewed following labs and imaging studies  CBC: Recent Labs  Lab 09/07/20 2000 09/08/20 0603 09/09/20 0712 09/10/20 0621 09/11/20 0425  WBC 8.0 11.0* 8.4 8.2 5.1  NEUTROABS 6.9  --   --   --   --   HGB 10.6* 9.7* 9.4* 8.7* 8.3*  HCT 32.5* 30.1* 29.7* 28.2* 25.7*  MCV 84.4 85.0 85.3 87.6 84.8  PLT 390 339 362 371 277    Basic Metabolic Panel: Recent Labs   Lab 09/07/20 2000 09/08/20 0603 09/09/20 0712 09/10/20 0621 09/11/20 0425  NA 133* 136 142 145 140  K 3.8 3.9 2.7* 3.1* 3.3*  CL 87* 90* 97* 100 100  CO2 31 34* 34* 27 31  GLUCOSE 284* 199* 94 120* 171*  BUN 27* 27* _1 CREATININE 0.93 0.94 0.95 1.21 1.00  CALCIUM 11.8* 11.3* 10.8* 10.8* 9.5  MG 1.1* 1.6* 1.3* 1.6* 1.5*    GFR: Estimated Creatinine Clearance: 60.8 mL/min (by C-G formula based on SCr of 1 mg/dL).  Liver Function Tests: Recent Labs  Lab 09/07/20 2000 09/08/20 0603 09/09/20 0712 09/10/20 0621 09/11/20 0425  AST 39 21 13* 10* 9*  ALT 56* 41 _2 ALKPHOS 128* 103 89 84 74  BILITOT 0.5 0.5 0.6 1.3* 0.6  PROT 7.5 6.2* 5.9* 5.8* 5.4*  ALBUMIN 3.1* 2.6* 2.5* 2.5* 2.4*    CBG: Recent Labs  Lab 09/10/20 2210 09/11/20 0012 09/11/20 0618 09/11/20 1118 09/11/20 1654  GLUCAP 313* 260* 145* 301* 134*    Recent Results (from the past 240 hour(s))  Resp Panel by RT-PCR (Flu A&B, Covid) Nasopharyngeal Swab     Status: None   Collection Time: 09/08/20  1:10 AM   Specimen: Nasopharyngeal Swab; Nasopharyngeal(NP) swabs in vial transport medium  Result Value Ref Range Status   SARS Coronavirus 2 by RT PCR NEGATIVE NEGATIVE Final    Comment: (NOTE) SARS-CoV-2 target nucleic acids are NOT DETECTED.  The SARS-CoV-2 RNA is generally detectable in upper respiratory specimens during the acute phase of infection. The lowest concentration of SARS-CoV-2 viral copies this assay can detect is 138 copies/mL. A negative result does not preclude SARS-Cov-2 infection and should not be used as the sole basis for treatment or other patient management decisions. A negative result may occur with  improper specimen collection/handling, submission of specimen other than nasopharyngeal swab, presence of viral mutation(s) within the areas targeted by this assay, and inadequate number of viral copies(<138 copies/mL). A negative result must be combined with clinical  observations, patient history, and epidemiological information. The expected result is Negative.  Fact Sheet for Patients:  EntrepreneurPulse.com.au  Fact Sheet for Healthcare Providers:  IncredibleEmployment.be  This test is no t yet approved or cleared by the [place] FDA and  has been authorized for detection and/or diagnosis of SARS-CoV-2 by FDA under an Emergency Use Authorization (EUA). This EUA will remain  in effect (meaning this test can be used) for the duration of the COVID-19 declaration under Section 564(b)(1) of the Act, 21 U.S.C.section 360bbb-3(b)(1), unless the authorization is terminated  or revoked sooner.       Influenza A by PCR NEGATIVE NEGATIVE Final   Influenza B by PCR NEGATIVE NEGATIVE Final    Comment: (NOTE) The Xpert Xpress SARS-CoV-2/FLU/RSV plus assay is intended as an aid in the diagnosis of influenza from Nasopharyngeal swab specimens and should not be used as a sole basis for treatment. Nasal washings and aspirates are unacceptable for Xpert Xpress SARS-CoV-2/FLU/RSV testing.  Fact Sheet for Patients: EntrepreneurPulse.com.au  Fact Sheet for Healthcare Providers: IncredibleEmployment.be  This test is not yet approved or cleared by the [place] FDA and has been authorized for detection and/or diagnosis of SARS-CoV-2 by FDA under an Emergency Use Authorization (EUA). This EUA will remain in effect (meaning this test can be used) for the duration of the COVID-19 declaration under Section 564(b)(1) of the Act, 21 U.S.C. section 360bbb-3(b)(1), unless the authorization is terminated or revoked.  Performed at [facility], [address], [place], [place] [address]   MRSA PCR Screening     Status: None   Collection Time: 09/08/20  2:26 AM   Specimen: Nasal Mucosa; Nasopharyngeal  Result Value Ref Range Status   MRSA by PCR NEGATIVE NEGATIVE Final    Comment:         The GeneXpert MRSA Assay (FDA approved for NASAL specimens only), is one component of a comprehensive MRSA colonization surveillance program. It is not intended to diagnose MRSA infection nor to guide or monitor treatment for MRSA infections. Performed at [facility], [address], [place], [place] [address]      Radiology Studies: DG Abd Portable 2V  Result Date: 09/10/2020 CLINICAL DATA:  Chronic constipation and bowel obstruction  EXAM: PORTABLE ABDOMEN - 2 VIEW COMPARISON:  September 09, 2020 FINDINGS: Supine and upright images obtained. Nasogastric tube tip and side port in stomach. There is moderate stool in the colon. There is mild transverse colonic dilatation with several loops of small bowel upper normal in size. No air-fluid levels. No free air evident. Atelectatic change noted in right lung base. IMPRESSION: Nasogastric tube tip and side port in stomach. Moderate stool in colon. Borderline prominence of bowel loops potentially could indicate enteritis or ileus. Bowel obstruction not felt to be likely. No free air. Atelectatic change right lung base. Electronically Signed   By: Lowella Grip III M.D.   On: 09/10/2020 12:21   Scheduled Meds: . Chlorhexidine Gluconate Cloth  6 each Topical Daily  . enoxaparin (LOVENOX) injection  1 mg/kg Subcutaneous Q12H  . insulin aspart  0-9 Units Subcutaneous TID WC  . metoprolol tartrate  7.5 mg Intravenous Q6H  . pantoprazole (PROTONIX) IV  40 mg Intravenous Q12H  . polyethylene glycol  17 g Oral TID   Continuous Infusions: . sodium chloride 50 mL/hr at 09/11/20 1650  . ampicillin-sulbactam (UNASYN) IV 3 g (09/11/20 1034)    LOS: 3 days   Time spent: 18 mins   Skylarr Liz Wynetta Emery, MD How to contact the [facility] Attending or Consulting provider [place] or covering provider during after hours [place], for this patient?  1. Check the care team in [facility] and look for a) attending/consulting [facility] provider listed and b) the [facility] team listed 2. Log  into [email or web address] and use [place]'s universal password to access. If you do not have the password, please contact the hospital operator. 3. Locate the [facility] provider you are looking for under [facility] and page to a number that you can be directly reached. 4. If you still have difficulty reaching the provider, please page the [facility] (Director on Call) for the Hospitalists listed on amion for assistance.  09/11/2020, 5:21 PM

## 2020-09-11 NOTE — Progress Notes (Addendum)
Subjective: No complaints. Little attainable information. Denies abdominal pain, nausea, or vomiting. Nurse tech just changed patient. States he had a small loose BM that was green. No brbpr or melena.   Spoke with patient's nurse. No BM yesterday though patient didn't receive enemas. Nurse states she was not able to see the orders for the enemas. He had a good BM this morning. Tolerating his diet well and took MiraLAX without trouble.   Objective: Vital signs in last 24 hours: Temp:  [98.1 F (36.7 C)-98.6 F (37 C)] 98.6 F (37 C) (04/05 0903) Pulse Rate:  [83-121] 94 (04/05 0903) Resp:  [16-18] 16 (04/05 0903) BP: (131-142)/(73-93) 135/78 (04/05 0903) SpO2:  [100 %] 100 % (04/05 0903) Last BM Date: 09/08/20 General:   Alert and oriented to self. Pleasant and cooperative. NAD.  Head:  Normocephalic and atraumatic. Eyes:  No icterus, sclera clear. Conjuctiva pink.  Abdomen:  Bowel sounds present, soft, non-tender, non-distended. No HSM or hernias noted. No rebound or guarding. No masses appreciated  Extremities:  Without edema. Neurologic:  Alert and  oriented to person. Unable to assess further.   Intake/Output from previous day: 04/04 0701 - 04/05 0700 In: 3388.7 [P.O.:240; I.V.:2338.3; IV Piggyback:810.4] Out: 2400 [Urine:2400] Intake/Output this shift: No intake/output data recorded.  Lab Results: Recent Labs    09/09/20 0712 09/10/20 0621 09/11/20 0425  WBC 8.4 8.2 5.1  HGB 9.4* 8.7* 8.3*  HCT 29.7* 28.2* 25.7*  PLT 362 371 329   BMET Recent Labs    09/09/20 0712 09/10/20 0621 09/11/20 0425  NA 142 145 140  K 2.7* 3.1* 3.3*  CL 97* 100 100  CO2 34* 27 31  GLUCOSE 94 120* 171*  BUN 18 22 17   CREATININE 0.95 1.21 1.00  CALCIUM 10.8* 10.8* 9.5   LFT Recent Labs    09/09/20 0712 09/10/20 0621 09/11/20 0425  PROT 5.9* 5.8* 5.4*  ALBUMIN 2.5* 2.5* 2.4*  AST 13* 10* 9*  ALT 29 21 17   ALKPHOS 89 84 74  BILITOT 0.6 1.3* 0.6     Studies/Results: DG Abd 1 View  Result Date: 09/09/2020 CLINICAL DATA:  Gastric distension EXAM: ABDOMEN - 1 VIEW COMPARISON:  September 08, 2020 FINDINGS: Nasogastric tube is seen with its distal tip noted along the expected region of the gastric fundus. Air is seen throughout the large and small bowel with mildly dilated small bowel loops seen within the left lower quadrant. No radio-opaque calculi or other significant radiographic abnormality are seen. 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 IMPRESSION: 1. Nasogastric tube positioning, as described above. 2. Additional findings suggestive of an early partial small bowel obstruction versus ileus. Electronically Signed   By: Virgina Norfolk M.D.   On: 09/09/2020 18:31   DG Abd Portable 2V  Result Date: 09/10/2020 CLINICAL DATA:  Chronic constipation and bowel obstruction EXAM: PORTABLE ABDOMEN - 2 VIEW COMPARISON:  September 09, 2020 FINDINGS: Supine and upright images obtained. Nasogastric tube tip and side port in stomach. There is moderate stool in the colon. There is mild transverse colonic dilatation with several loops of small bowel upper normal in size. No air-fluid levels. No free air evident. Atelectatic change noted in right lung base. IMPRESSION: Nasogastric tube tip and side port in stomach. Moderate stool in colon. Borderline prominence of bowel loops potentially could indicate enteritis or ileus. Bowel obstruction not felt to be likely. No free air. Atelectatic change right lung base. Electronically Signed   By: Lowella Grip III  M.D.   On: 09/10/2020 12:21    Assessment: 70 year old male with history of diabetes, hyperlipidemia, hypertension, cognitive impairment, history of duodenal mass, gastric ulcers,CVA,who was admitted to the hospital after being found to have a DVT at [facility], and subsequently found to have interval marked circumferential wall thickening of distal esophagus, marked fluid distension of stomach without acute SBO,  query functional obstruction, generalized distension of colon with large stool burden, possible internal hernia but surgery following and felt not to have this.  Abdominal x-ray repeated yesterday with moderate stool in the colon, borderline prominence of bowel loops potentially could indicate enteritis or ileus, no bowel obstruction.  NG tube was discontinued and he was started on full liquids and MiraLAX.   Clinically, he is doing very well.  His abdomen is soft and non-distended.  Tolerating his diet well.  He did not have a BM yesterday as he did not receive tapwater enemas.  He did start MiraLAX yesterday and had 1 good BM this morning.  As his x-ray still showed moderate stool burden yesterday, I suspect he will likely benefit from tapwater enemas again today to ensure he is adequately evacuated.  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 Rush Landmark. Needs EGD as well for ulcer healing. Timing to be determined with Dr. Rush Landmark especially in light of recent DVT and anticoagulation.   Normocytic anemia: No overt GI bleeding.  BUN remains within normal limits.  Known PUD, duodenal mass, and now on anticoagulation for DVT.  Slow decline in hemoglobin since admission.  Hemoglobin 10.6 on 4/1, down to 8.3 today.  Decline likely influenced by hemodilution.  We will continue to monitor for now.  Plan: 1.  Continue MiraLAX daily. 2.  Resume tapwater enemas today to ensure adequate stool evacuation. New order placed as nurse wasn't able to see this.  3.  Continue PPI twice daily. 4.  Keep outpatient appointment on 4/8 with Dr. Rush Landmark.  5.  Continue to monitor H/H and for overt GI bleeding.   LOS: 3 days    09/11/2020, 9:40 AM   Aliene Altes, PA-C [facility]   Gastroenterology attending attestation note: Agree with the below findings as written. The patient was presented to me by the APP (Advanced Practice Provider) I personally reviewed the medical chart  and evaluated the patient. I personally evaluated and examined the patient by myself.  I agree with Mrs. Tivis Ringer Harper's H/P and assessment.  The patient has presented improvement of his oral intake as he has been able to eat clear liquids today after NG tube was removed without any episodes of nausea or vomiting.  Had one large bowel movement today in the morning.  Currently on MiraLAX twice a day.  Has not present any melena or hematochezia, remains electively stable since transfer from the ICU.  Overall, his labs only showed mild decrease in his hemoglobin but he has not presented any overt signs of gastrointestinal bleeding, with normal BUN.  Will need to advance diet as tolerated.  I consider that he will need to continue with the PPI twice a day and also will increase his MiraLAX to 3 times daily dosing as this may provide further improvement of his severe constipation.  Ultimately, he has an appointment with Dr. Rush Landmark on 09/14/2020 for evaluation of his duodenal mass to determine if this could be endoscopically resected.  Agree with palliative care consult.  GI service will sign-off, please call us back if you have any more questions.  [name], MD Gastroenterology and Hepatology [facility]

## 2020-09-11 NOTE — Progress Notes (Signed)
[facility]  Tolerating diet and having Bms. Agree with continuing plan for EUS with Dr. Early Osmond for duodenal mass. Palliative consult pending.  No surgical intervention indicated at this time.   Curlene Labrum, MD [facility] [address] [place], [place] [address] [phone] (office)

## 2020-09-11 NOTE — Telephone Encounter (Signed)
Patient needs hospital follow-up in 2-3 weeks with Dr. Jenetta Downer. DX: Constipation, duodenal mass, normocytic anemia.

## 2020-09-12 ENCOUNTER — Telehealth (INDEPENDENT_AMBULATORY_CARE_PROVIDER_SITE_OTHER): Payer: Self-pay | Admitting: Gastroenterology

## 2020-09-12 DIAGNOSIS — R140 Abdominal distension (gaseous): Secondary | ICD-10-CM | POA: Diagnosis not present

## 2020-09-12 LAB — RESP PANEL BY RT-PCR (FLU A&B, COVID) ARPGX2
Influenza A by PCR: NEGATIVE
Influenza B by PCR: NEGATIVE
SARS Coronavirus 2 by RT PCR: NEGATIVE

## 2020-09-12 LAB — COMPREHENSIVE METABOLIC PANEL
ALT: 24 U/L (ref 0–44)
AST: 18 U/L (ref 15–41)
Albumin: 2.8 g/dL — ABNORMAL LOW (ref 3.5–5.0)
Alkaline Phosphatase: 94 U/L (ref 38–126)
Anion gap: 12 (ref 5–15)
BUN: 9 mg/dL (ref 8–23)
CO2: 27 mmol/L (ref 22–32)
Calcium: 9.3 mg/dL (ref 8.9–10.3)
Chloride: 96 mmol/L — ABNORMAL LOW (ref 98–111)
Creatinine, Ser: 0.82 mg/dL (ref 0.61–1.24)
GFR, Estimated: >60 mL/min (ref >60)
Glucose, Bld: 173 mg/dL — ABNORMAL HIGH (ref 70–99)
Potassium: 3.5 mmol/L (ref 3.5–5.1)
Sodium: 135 mmol/L (ref 135–145)
Total Bilirubin: 0.4 mg/dL (ref 0.3–1.2)
Total Protein: 6.3 g/dL — ABNORMAL LOW (ref 6.5–8.1)

## 2020-09-12 LAB — MAGNESIUM: Magnesium: 1.7 mg/dL (ref 1.7–2.4)

## 2020-09-12 LAB — GLUCOSE, CAPILLARY
Glucose-Capillary: 172 mg/dL — ABNORMAL HIGH (ref 70–99)
Glucose-Capillary: 178 mg/dL — ABNORMAL HIGH (ref 70–99)
Glucose-Capillary: 183 mg/dL — ABNORMAL HIGH (ref 70–99)
Glucose-Capillary: 207 mg/dL — ABNORMAL HIGH (ref 70–99)

## 2020-09-12 MED ORDER — ENOXAPARIN SODIUM 60 MG/0.6ML ~~LOC~~ SOLN: 1.0000 mg/kg | Freq: Two times a day (BID) | SUBCUTANEOUS | 0 refills | Status: DC

## 2020-09-12 MED ORDER — LACTULOSE 20 GM/30ML PO SOLN: 20.0000 g | ORAL | 0 refills | Status: DC

## 2020-09-12 MED ORDER — POLYETHYLENE GLYCOL 3350 17 G PO PACK: 17.0000 g | PACK | Freq: Two times a day (BID) | ORAL | 2 refills | Status: AC

## 2020-09-12 MED ORDER — METOPROLOL SUCCINATE ER 25 MG PO TB24
25.0000 mg | ORAL_TABLET | Freq: Once | ORAL | Status: AC
  Administered 2020-09-12: 25 mg via ORAL
  Filled 2020-09-12: qty 1

## 2020-09-12 MED ORDER — AMOXICILLIN-POT CLAVULANATE 875-125 MG PO TABS: 1.0000 | ORAL_TABLET | Freq: Two times a day (BID) | ORAL | 0 refills | Status: AC

## 2020-09-12 MED ORDER — SENNOSIDES-DOCUSATE SODIUM 8.6-50 MG PO TABS: 2.0000 | ORAL_TABLET | Freq: Every day | ORAL | 3 refills | Status: AC

## 2020-09-12 NOTE — Discharge Instructions (Signed)
1)Avoid ibuprofen/Advil/Aleve/Motrin/Goody Powders/Naproxen/BC powders/Meloxicam/Diclofenac/Indomethacin and other Nonsteroidal anti-inflammatory medications as these will make you more likely to bleed and can cause stomach ulcers, can also cause Kidney problems.  2)Last Dose of Lovenox in am of Thursday 09/13/20, then Restart in the evening of Friday 09/14/20 after Gi Procedure if ok with Gi Physician-Dr. Mansouraty....  Patient will need to be switched to oral apixaban/Eliquis around Sunday 09/16/2020 if no bleeding concerns post procedure  3) please keep gastroenterology appointment for EGD/EUS with duodenal mass biopsy by Dr. Rush Landmark on Friday, 09/14/2020 as previously arranged  4) hold Metformin in the a.m. of 09/14/2020 to allow for procedure and avoid hypoglycemia  5) please keep patient NPO  after midnight on 09/13/2020 to allow for GI procedure as outlined above #3 in a.m. of 09/14/2020  6) weekly CBC and CMP blood test every Monday for the next 3 weeks advised starting 09/17/2020--- need to monitor calcium levels and hemoglobin  7) please consider lactulose 20 g p.o. x1 as needed anytime there is no bowel movement in 48 hours

## 2020-09-12 NOTE — Telephone Encounter (Signed)
Does this patient need a follow up appointment per [facility]) with you even though he has one at [facility] on 4/9

## 2020-09-12 NOTE — Telephone Encounter (Signed)
Would need a non urgent follow up, can be in 4 weeks or so if possible

## 2020-09-12 NOTE — Telephone Encounter (Signed)
Forwarded to Mitzie to sch'd OV 

## 2020-09-12 NOTE — TOC Transition Note (Signed)
Transition of Care [facility]) - CM/SW Discharge Note   Patient Details  Name: Daniel [name] MRN: [id number] Date of Birth: 04-Aug-1950  Transition [facility] CM/SW Contact:  Shade Flood, LCSW Phone Number: 09/12/2020, 2:44 PM   Clinical Narrative:     Pt stable for dc per MD. Damaris Schooner with Bryson Ha at [facility] and pt can return today. [facility] has pt on the schedule for transport to his appointment on Friday at [facility].   Spoke with pt's legal guardian to update on DC. She is in agreement with return to [facility] by EMS.  DC clinical will be sent electronically. RN to call report. There are no other TOC needs for dc.  Final next level of care: Long Term Nursing Home Barriers to Discharge: Barriers Resolved   Patient Goals and CMS Choice Patient states their goals for this hospitalization and ongoing recovery are:: return to SNF      Discharge Placement                       Discharge Plan and Services In-house Referral: Clinical Social Work   Post Acute Care Choice: Nursing Home          DME Arranged: N/A DME Agency: NA                  Social Determinants of Health (SDOH) Interventions     Readmission Risk Interventions Readmission Risk Prevention Plan 09/10/2020 06/21/2020 06/08/2020  Transportation Screening Complete Complete Complete  PCP or Specialist Appt within 5-7 Days - - Complete  Home Care Screening - - Complete  Medication Review (RN CM) - - Complete  HRI or Home Care Consult Complete Complete -  Social Work Consult for [place] Planning/Counseling Complete Complete -  Palliative Care Screening - Not Applicable -  Medication Review Press photographer) Complete Complete -  Some recent data might be hidden

## 2020-09-12 NOTE — Discharge Summary (Addendum)
Daniel [name], is a 70 y.o. male  DOB Dec 02, 1950  MRN [id number].  Admission date:  09/07/2020  Admitting Physician  Rolla Plate, DO  Discharge Date:  09/12/2020   Primary MD  Leonie Douglas, MD  Recommendations for primary care physician for things to follow:   1)Avoid ibuprofen/Advil/Aleve/Motrin/Goody Powders/Naproxen/BC powders/Meloxicam/Diclofenac/Indomethacin and other Nonsteroidal anti-inflammatory medications as these will make you more likely to bleed and can cause stomach ulcers, can also cause Kidney problems.  2)Last Dose of Lovenox in am of Thursday 09/13/20, then Restart in the evening of Friday 09/14/20 after Gi Procedure if ok with Gi Physician-Dr. Mansouraty....  Patient will need to be switched to oral apixaban/Eliquis around Sunday 09/16/2020 if no bleeding concerns post procedure  3) please keep gastroenterology appointment for EGD/EUS with duodenal mass biopsy by Dr. Rush Landmark on Friday, 09/14/2020 as previously arranged  4) hold Metformin in the a.m. of 09/14/2020 to allow for procedure and avoid hypoglycemia  5) please keep patient NPO  after midnight on 09/13/2020 to allow for GI procedure as outlined above #3 in a.m. of 09/14/2020  6) weekly CBC and CMP blood test every Monday for the next 3 weeks advised starting 09/17/2020--- need to monitor calcium levels and hemoglobin  7) please consider lactulose 20 g p.o. x1 as needed anytime there is no bowel movement in 48 hours   Admission Diagnosis  DVT (deep vein thrombosis)  Abdominal pain [R10.9]   Discharge Diagnosis  DVT (deep vein thrombosis) Abdominal pain [R10.9]   Principal Problem:   Abdominal distention Active Problems:   Type 2 diabetes mellitus without complication, without long-term current use of insulin (HCC)   Hypomagnesemia   Anemia   Aspiration pneumonia (HCC)   Hypercalcemia   Gastric distention   Partial  bowel obstruction (HCC)   Chronic constipation   DVT (deep vein thrombosis)       Past Medical History:  Diagnosis Date  . Diabetes mellitus without complication ([place])   . High cholesterol   . Hypertension     Past Surgical History:  Procedure Laterality Date  . BIOPSY  06/21/2020   Procedure: BIOPSY;  Surgeon: Daneil Dolin, MD;  Location: [facility];  Service: Endoscopy;;  gastric  . ESOPHAGOGASTRODUODENOSCOPY (EGD) WITH PROPOFOL N/A 06/21/2020   Procedure: ESOPHAGOGASTRODUODENOSCOPY (EGD) WITH PROPOFOL;  Surgeon: Daneil Dolin, MD;  Location: [facility];  Service: Endoscopy;  Laterality: N/A;  . HIP SURGERY     left  . KIDNEY SURGERY       HPI  from the history and physical done on the day of admission:      Daniel [name]  is a 70 y.o. male,with history of HTN, HLD, and DMII presents to the ED with a chief complaint of DVT. Patient is a ward of the state from the [facility]. Unfortunately he is not able to provide much history. He answers most questions with "I don't know." From report, and chart review, it seems that an [place] of right lower extremity for DVT  was done at the facility. The result revealed a DVT. Patient was then transported to the ED. Upon arrival - patient also had significant abdominal distention. Patient denied abdominal pain at arrival, and still denies abdominal pain now. Workup revealed a bowel obstruction. NG tube was placed, and within a few minutes had 1 L of dark brown/black output. Rectal occult blood test was negative. Gastric contents occult blood is pending. Patient is afebrile without leukocytosis. He has had a recent admission for GI bleed - hemoglobin is stable at 10.6. Chemistry panel reveals a hyperglycemia at 284, alk phos 128, albumin 3.1, AST/ALT 39/56. CT abdomen shows esophagitis, distended stomach indicative of functional bowel obstruction. It also reveals pneumonia vs aspiration, and absent left kidney.  EKG reveals sinus tach with a heart  rate 124, QTc 464.  Given the DVT patient was started on heparin drip per pharmacy consult.  Dr. Constance Haw was consulted and advised NG tube and she will see in the morning.  GI was consulted and also reports they will see in a.m.  Patient was given 2 L of fluid, Zofran 4 mg, and Protonix.   Hospital Course:     Brief Admission History:  70 y.o.male,with history of HTN, HLD, and DMII presents to the ED with a chief complaint of DVT. Patient is a ward of the state from the [facility]. Unfortunately he is not able to provide much history. He answers most questions with "I don't know." From report, and chart review, it seems that an [place] of right lower extremity for DVT was done at the facility. The result revealed a DVT. Patient was then transported to the ED. Upon arrival - patient also had significant abdominal distention. Patient denied abdominal pain at arrival, and still denies abdominal pain now. Workup revealed a bowel obstruction. NG tube was placed, and within a few minutes had 1 L of dark brown/black output. Rectal occult blood test was negative. Gastric contents occult blood is pending. Patient is afebrile without leukocytosis. He has had a recent admission for GI bleed - hemoglobin is stable at 10.6. Chemistry panel reveals a hyperglycemia at 284, alk phos 128, albumin 3.1, AST/ALT 39/56. CT abdomen shows esophagitis, distended stomach indicative of functional bowel obstruction. It also reveals pneumonia vs aspiration, and absent left kidney. EKG reveals sinus tach with a heart rate 124, QTc 464. Given the DVT patient was started on heparin drip per pharmacy consult. Dr. Constance Haw was consulted and advised NG tube and she will see in the morning. GI was consulted and also reports they will see in a.m. Patient was given 2 L of fluid, Zofran 4 mg, and Protonix.    A/p Severe constipation with partial bowel obstruction -initially required NG suction , as well as tap water enemas and laxatives --  He has evacuated large amount of stool with laxative and tap water enemas. -Okay to discharge on a combination of MiraLAX twice daily and Senokot-S nightly -May use lactulose 20 g every 48 hours if no BM   Duodenal mass -GI consult appreciated plan is for EGD with EUS and duodenal mass biopsy by Dr. Rush Landmark outpatient on 09/14/20.  -Please keep patient n.p.o. after midnight on 09/13/2020 for this procedure -Also please no Lovenox after a.m. of 09/13/2020 (hold p.m. dose of Lovenox on 09/13/2020 and also hold a.m. dose of Lovenox on 09/14/2020) -Since patient will be n.p.o. please hold a.m. dose of Metformin on 09/14/2020 to avoid hypoglycemia  Hypercalcemia - corrected calcium was 12.0.  Elevated  calcium likely from malignancy - IV zoledronic acid was given on 4/3  --Calcium is trending down---await duodenal mass biopsy -Weekly CMP advised  Hypomagnesemia/hypokalemia--- repleted -Weekly CMP advised, replace electrolytes as indicated  Aspiration pneumonia -overall improved after treatment with IV amp/sulbactam -discharge on p.o. Augmentin  Type 2 DM -  -Since patient will be n.p.o. please hold a.m. dose of Metformin on 09/14/2020 to avoid hypoglycemia  Goals of care: Requesting  palliative consultation for goals of care. -Palliative care practitioner Ms. 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    RLE DVT -  Dopplers apparently were done at outside facility - Full dose enoxaparin per pharm D ordered. Had initially planned to start oral DOAC on 4/5. After further consideration, it may not be best idea to start [place] now because he is scheduled to have duodenal biopsy done on 4/8 with Dr. Early Osmond.   I would hold DOAC for now and could continue lovenox until GI says it is safe to start after duodenal biopsy.  He would need to hold lovenox on day prior to having biopsy done.  -Last Dose of Lovenox in am of Thursday 09/13/20, then Restart in the evening of Friday 09/14/20 after Gi Procedure if ok with Gi Physician-Dr. Mansouraty....  Patient will need to be switched to oral apixaban/Eliquis around Sunday 09/16/2020 if no bleeding concerns post procedure -Given concerns about underlying malignancy patient may need lifelong anticoagulation   Code Status: currently full,  Family Communication: has a legal guardian Disposition:  Back to SNF  Discharge Condition: stable  Follow UP   Follow-up Information    Mansouraty, Telford Nab., MD. Go today.   Specialties: Gastroenterology, Internal Medicine Why: EGD with EUS and duodenal biopsy on Friday, 09/14/2020 Contact information: [address] [place] [address] [phone]                Consults obtained -GI, palliative care  Diet and Activity recommendation:  As advised  Discharge Instructions    Discharge Instructions    Call MD for:  difficulty breathing, headache or visual disturbances   Complete by: As directed    Call MD for:  persistant nausea and vomiting   Complete by: As directed    Call MD for:  temperature >100.4   Complete by: As directed    Diet - low sodium heart healthy   Complete by: As directed    Diet Carb Modified   Complete by: As directed    Discharge instructions   Complete by: As directed    1)Avoid ibuprofen/Advil/Aleve/Motrin/Goody Powders/Naproxen/BC powders/Meloxicam/Diclofenac/Indomethacin and other Nonsteroidal anti-inflammatory medications as these will make you more likely to bleed and can cause stomach ulcers, can also cause Kidney problems.  2)Last Dose of Lovenox in am of Thursday 09/13/20, then Restart in the evening of Friday 09/14/20 after Gi Procedure if ok with Gi Physician-Dr.  Mansouraty....  Patient will need to be switched to oral apixaban/Eliquis around Sunday 09/16/2020 if no bleeding concerns post procedure  3) please keep gastroenterology appointment for EGD/EUS with duodenal mass biopsy by Dr. Rush Landmark on Friday, 09/14/2020 as previously arranged  4) hold Metformin in the a.m. of 09/14/2020 to allow for procedure and avoid hypoglycemia  5) please keep patient NPO  after midnight on  09/13/2020 to allow for GI procedure as outlined above #3 in a.m. of 09/14/2020  6) weekly CBC and CMP blood test every Monday for the next 3 weeks advised starting 09/17/2020--- need to monitor calcium levels and hemoglobin  7) please consider lactulose 20 g p.o. x1 as needed anytime there is no bowel movement in 48 hours   Increase activity slowly   Complete by: As directed        Discharge Medications     Allergies as of 09/12/2020   No Known Allergies     Medication List    TAKE these medications   acetaminophen 325 MG tablet Commonly known as: TYLENOL Take 2 tablets (650 mg total) by mouth every 6 (six) hours as needed for mild pain, fever or headache (or Fever >/= 101).   aMILoride 5 MG tablet Commonly known as: MIDAMOR Take 1 tablet (5 mg total) by mouth daily.   amitriptyline 10 MG tablet Commonly known as: ELAVIL Take 10 mg by mouth at bedtime.   amoxicillin-clavulanate 875-125 MG tablet Commonly known as: Augmentin Take 1 tablet by mouth 2 (two) times daily for 3 days.   atorvastatin 40 MG tablet Commonly known as: LIPITOR Take 1 tablet (40 mg total) by mouth daily.   donepezil 10 MG tablet Commonly known as: ARICEPT TAKE (1) TABLET BY MOUTH ONCE DAILY. What changed: See the new instructions.   dronabinol 2.5 MG capsule Commonly known as: MARINOL Take 2.5 mg by mouth 2 (two) times daily before lunch and supper.   enoxaparin 60 MG/0.6ML injection Commonly known as: LOVENOX Inject 0.6 mLs (60 mg total) into the skin every 12 (twelve) hours. Last Dose  in am of Thursday 09/13/20, then Restart in the evening of Friday 09/14/20 after Gi Procedure if ok with Gi Physician-Dr. Mansouraty   feeding supplement Liqd Take 237 mLs by mouth 2 (two) times daily between meals.   FeroSul 325 (65 FE) MG tablet Generic drug: ferrous sulfate TAKE 1 TABLET BY MOUTH TWICE DAILY. What changed:   how much to take  when to take this   folic acid 751 MCG tablet Commonly known as: FOLVITE Take 800 mcg by mouth daily.   insulin aspart 100 UNIT/ML FlexPen Commonly known as: NOVOLOG Inject 0-10 Units into the skin 3 (three) times daily with meals. insulin aspart (novoLOG) injection 0-10 Units 0-10 Units Subcutaneous, 3 times daily with meals CBG < 70: Implement Hypoglycemia Standing Orders and refer to Hypoglycemia Standing Orders sidebar report  CBG 70 - 120: 0 unit CBG 121 - 150: 0 unit  CBG 151 - 200: 1 unit CBG 201 - 250: 2 units CBG 251 - 300: 4 units CBG 301 - 350: 6 units  CBG 351 - 400: 8 units  CBG > 400: 10 units   Lactulose 20 GM/30ML Soln Take 30 mLs (20 g total) by mouth See admin instructions. --Please give lactulose 20 g p.o. x1 if no bowel movement in 48 hours   magnesium oxide 400 MG tablet Commonly known as: MAG-OX Take 2 tablets (800 mg total) by mouth 3 (three) times daily.   memantine 10 MG tablet Commonly known as: NAMENDA Take 10 mg by mouth 2 (two) times daily.   metFORMIN 1000 MG tablet Commonly known as: GLUCOPHAGE Take 1 tablet (1,000 mg total) by mouth 2 (two) times daily with a meal.   metoprolol tartrate 25 MG tablet Commonly known as: LOPRESSOR Take 1 tablet (25 mg total) by mouth 2 (two) times daily.  ondansetron 4 MG tablet Commonly known as: ZOFRAN Take 1 tablet (4 mg total) by mouth every 6 (six) hours as needed for nausea.   oxybutynin 5 MG 24 hr tablet Commonly known as: DITROPAN-XL Take 5 mg by mouth daily.   OXYGEN Inhale 2 L into the lungs daily.   pantoprazole 40 MG tablet Commonly known as:  Protonix Take 1 tablet (40 mg total) by mouth daily.   polyethylene glycol 17 g packet Commonly known as: MiraLax Take 17 g by mouth 2 (two) times daily. For Constipation   senna-docusate 8.6-50 MG tablet Commonly known as: Senokot-S Take 2 tablets by mouth at bedtime.   sucralfate 1 g tablet Commonly known as: Carafate Take 1 tablet (1 g total) by mouth 4 (four) times daily.   tamsulosin 0.4 MG Caps capsule Commonly known as: FLOMAX TAKE 1 CAPSULE BY MOUTH AT BEDTIME.   traZODone 100 MG tablet Commonly known as: DESYREL Take 100 mg by mouth at bedtime.   vitamin B-12 500 MCG tablet Commonly known as: CYANOCOBALAMIN Take 1,000 mcg by mouth daily.   Vitamin D3 50 MCG (2000 UT) capsule TAKE 1 CAPSULE BY MOUTH ONCE DAILY. **DO NOT CRUSH** What changed: See the new instructions.       Major procedures and Radiology Reports - PLEASE review detailed and final reports for all details, in brief -   DG Abd 1 View  Result Date: 09/09/2020 CLINICAL DATA:  Gastric distension EXAM: ABDOMEN - 1 VIEW COMPARISON:  September 08, 2020 FINDINGS: Nasogastric tube is seen with its distal tip noted along the expected region of the gastric fundus. Air is seen throughout the large and small bowel with mildly dilated small bowel loops seen within the left lower quadrant. No radio-opaque calculi or other significant radiographic abnormality are seen. 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 IMPRESSION: 1. Nasogastric tube positioning, as described above. 2. Additional findings suggestive of an early partial small bowel obstruction versus ileus. Electronically Signed   By: Virgina Norfolk M.D.   On: 09/09/2020 18:31   DG Abdomen 1 View  Result Date: 09/08/2020 CLINICAL DATA:  Nasogastric tube placement EXAM: ABDOMEN - 1 VIEW COMPARISON:  09/07/2020 FINDINGS: Nasogastric tube tip and side port project within the stomach. Dilated and gas-filled bowel seen throughout the abdomen. Excreted contrast in the urinary  bladder and right renal collecting system. IMPRESSION: Nasogastric tube tip and side port in the stomach. Electronically Signed   By: Ulyses Jarred M.D.   On: 09/08/2020 00:54   CT ABDOMEN PELVIS W CONTRAST  Result Date: 09/07/2020 CLINICAL DATA:  Nausea and vomiting coffee ground emesis EXAM: CT ABDOMEN AND PELVIS WITH CONTRAST TECHNIQUE: Multidetector CT imaging of the abdomen and pelvis was performed using the standard protocol following bolus administration of intravenous contrast. CONTRAST:  190mL OMNIPAQUE IOHEXOL 300 MG/ML  SOLN COMPARISON:  09/07/2020, CT 08/24/2020, CT 07/27/2020 FINDINGS: Lower chest: Lung bases demonstrate ground-glass density and patchy consolidation in the right lower lobe and posterior right lower lobe. No pleural effusion. Normal cardiac size. Marked circumferential wall thickening of the distal esophagus. Hepatobiliary: No calcified gallstone. No focal hepatic abnormality or biliary dilatation Pancreas: Truncated appearance of pancreas.  No inflammatory change. Spleen: Normal in size without focal abnormality. Adrenals/Urinary Tract: Adrenal glands are normal. No definitive left renal tissue identified, question very diminutive atrophic left kidney, series 3, image number 41. Small cysts in the right kidney. Subcentimeter hypodense lesions too small to further characterize. The bladder is normal. Stomach/Bowel: Marked fluid distension  of the stomach. Some fluid-filled nondistended small bowel in the pelvis. Large volume of stool in the colon. Marked air distension of colon in the right upper quadrant, this appears to be sigmoid colon. Slightly narrowed appearing sigmoid colon that traverses horizontally and extends to right upper quadrant, series 3 image number 55 through 65. Considerable stool distal to the loop of dilated sigmoid colon with large feces in the rectum. No acute bowel wall thickening. No corkscrew appearance of mesentery or vasculature in the region of sigmoid  colon narrowing. Similar configuration of the colon on 07/27/2020 but more normal course of sigmoid colon on CT from January of 2022. Vascular/Lymphatic: Nonaneurysmal aorta. Aortic atherosclerosis. No suspicious nodes Reproductive: Prostate is unremarkable. 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 Left hip arthroplasty with artifact. IMPRESSION: 1. Interval marked circumferential wall thickening of the distal esophagus, probably due to esophagitis. Marked fluid distension of the stomach without evidence for small bowel obstruction, question functional obstruction. Consider NG tube decompression. 2. Generalized distension of the colon with large feces throughout. More focally dilated loop of bowel in the right upper quadrant appears to represent dilated segment of sigmoid colon. Slightly aberrant course of sigmoid colon which appears slightly narrowed at the right lower quadrant with adjacent slightly narrowed segment of rectosigmoid colon, question some degree of colon obstruction, potentially from internal hernia. No corkscrew appearance to suggest a volvulus. Considerable stool within the rectosigmoid colon distal to this suggesting that obstruction may be partial. Note that a similar configuration of the colon was present on CT from 07/27/2020 3. Ground-glass density and patchy consolidation in the right lower lobe and posterior right upper lobe concerning for pneumonia or aspiration. 4. Absent left kidney Aortic Atherosclerosis (ICD10-I70.0). Electronically Signed   By: Donavan Foil M.D.   On: 09/07/2020 22:57   DG ABD ACUTE 2+V W 1V CHEST  Result Date: 09/07/2020 CLINICAL DATA:  Abdominal distension, emesis and lower extremity swelling EXAM: DG ABDOMEN ACUTE WITH 1 VIEW CHEST COMPARISON:  CT 08/24/2020, MR 06/25/2020, CT 06/19/2020 FINDINGS: There is extensive severe and increasing  air distention of what appears to be the colon given some haustra markings although several distended loops of bowel in the low pelvis could reflect some small bowel dilatation as well. A large volume of feculent material is noted within portions of the distended colon as well. No convincing evidence of subdiaphragmatic free air at this time. No suspicious abdominal calcifications. Lung volumes are markedly diminished. Some streaky and patchy opacities in the lung bases may reflect atelectasis, airspace disease or sequela of aspiration in the provided clinical context. Cardiomediastinal contours are unremarkable for the portable upright technique. No pneumothorax. No layering pleural effusion. Telemetry leads overlie the chest and abdomen with nasal cannula the base of the neck. Multilevel degenerative changes in the shoulders, spine, hips and pelvis with a prior left hip arthroplasty. IMPRESSION: Severe and increasing air distention of what appears to be the colon given some haustra markings although several distended loops of bowel in the low pelvis could reflect some small bowel dilatation as well. Large volume of feculent material within portions of the distended colon as well. Appearance could reflect a colonic obstruction or pseudo-obstruction versus ileus. Electronically Signed   By: Lovena Le M.D.   On: 09/07/2020 20:58   DG Abd Portable 2V  Result Date: 09/10/2020 CLINICAL DATA:  Chronic constipation and  bowel obstruction EXAM: PORTABLE ABDOMEN - 2 VIEW COMPARISON:  September 09, 2020 FINDINGS: Supine and upright images obtained. Nasogastric tube tip and side port in stomach. There is moderate stool in the colon. There is mild transverse colonic dilatation with several loops of small bowel upper normal in size. No air-fluid levels. No free air evident. Atelectatic change noted in right lung base. IMPRESSION: Nasogastric tube tip and side port in stomach. Moderate stool in colon. Borderline prominence of  bowel loops potentially could indicate enteritis or ileus. Bowel obstruction not felt to be likely. No free air. Atelectatic change right lung base. Electronically Signed   By: Lowella Grip III M.D.   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 Follow-up study. EXAM: CT ABDOMEN WITHOUT AND WITH CONTRAST TECHNIQUE: Multidetector CT imaging of the abdomen was performed following the standard protocol before and following the bolus administration of intravenous contrast. CONTRAST:  193m OMNIPAQUE IOHEXOL 300 MG/ML  SOLN COMPARISON:  CT the abdomen and pelvis 06/19/2020. MRI of the abdomen with MRCP 06/25/2020. FINDINGS: Lower chest: Areas of subsegmental atelectasis or scarring are noted in the right lung base. Hepatobiliary: No suspicious cystic or solid hepatic lesions. No intra or extrahepatic biliary ductal dilatation. Gallbladder is normal in appearance. Pancreas: No pancreatic mass. No pancreatic ductal dilatation. No pancreatic or peripancreatic fluid collections or inflammatory changes. 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 Spleen: Unremarkable. 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 No surgical clips are noted in the left retroperitoneum. Multiple subcentimeter low-attenuation lesions in the right kidney, too small to definitively characterize, but likely to represent cysts based on their appearance on prior abdominal MRI. Small nonobstructive calculi are present within the right renal collecting system measuring up to 5 mm in the interpolar region. No suspicious right renal lesions. No right hydroureteronephrosis in the visualized portions of the abdomen. Bilateral adrenal glands are normal in appearance. Stomach/Bowel: Normal appearance of the stomach. No pathologic  dilatation of the visualized portions of small bowel or colon. Vascular/Lymphatic: No aneurysm identified in the visualized abdominal vasculature. No lymphadenopathy noted in the abdomen. Other: Small umbilical hernia containing only omental fat. No significant volume of ascites and no pneumoperitoneum noted in the visualized portions of the peritoneal cavity. Musculoskeletal: No aggressive appearing osseous lesions are noted in the visualized portions of the skeleton. Status post left hip arthroplasty. IMPRESSION: 1. No pancreatic ductal dilatation. 2. No acute findings in the abdomen. 3. Incidental findings, as above. Electronically Signed   By: DVinnie LangtonM.D.   On: 08/25/2020 06:34    Micro Results    Recent Results (from the past 240 hour(s))  Resp Panel by RT-PCR (Flu A&B, Covid) Nasopharyngeal Swab     Status: None   Collection Time: 09/08/20  1:10 AM   Specimen: Nasopharyngeal Swab; Nasopharyngeal(NP) swabs in vial transport medium  Result Value Ref Range Status   SARS Coronavirus 2 by RT PCR NEGATIVE NEGATIVE Final    Comment: (NOTE) SARS-CoV-2 target nucleic acids are NOT DETECTED.  The SARS-CoV-2 RNA is generally detectable in upper respiratory specimens during the acute phase of infection. The lowest concentration of SARS-CoV-2 viral copies this assay can detect is 138 copies/mL. A negative result does not preclude SARS-Cov-2 infection and should not be used as the sole basis for treatment or other patient management  decisions. A negative result may occur with  improper specimen collection/handling, submission of specimen other than nasopharyngeal swab, presence of viral mutation(s) within the areas targeted by this assay, and inadequate number of viral copies(<138 copies/mL). A negative result must be combined with clinical observations, patient history, and epidemiological information. The expected result is Negative.  Fact Sheet for Patients:   EntrepreneurPulse.com.au  Fact Sheet for Healthcare Providers:  IncredibleEmployment.be  This test is no t yet approved or cleared by the [place] FDA and  has been authorized for detection and/or diagnosis of SARS-CoV-2 by FDA under an Emergency Use Authorization (EUA). This EUA will remain  in effect (meaning this test can be used) for the duration of the COVID-19 declaration under Section 564(b)(1) of the Act, 21 U.S.C.section 360bbb-3(b)(1), unless the authorization is terminated  or revoked sooner.       Influenza A by PCR NEGATIVE NEGATIVE Final   Influenza B by PCR NEGATIVE NEGATIVE Final    Comment: (NOTE) The Xpert Xpress SARS-CoV-2/FLU/RSV plus assay is intended as an aid in the diagnosis of influenza from Nasopharyngeal swab specimens and should not be used as a sole basis for treatment. Nasal washings and aspirates are unacceptable for Xpert Xpress SARS-CoV-2/FLU/RSV testing.  Fact Sheet for Patients: EntrepreneurPulse.com.au  Fact Sheet for Healthcare Providers: IncredibleEmployment.be  This test is not yet approved or cleared by the [place] FDA and has been authorized for detection and/or diagnosis of SARS-CoV-2 by FDA under an Emergency Use Authorization (EUA). This EUA will remain in effect (meaning this test can be used) for the duration of the COVID-19 declaration under Section 564(b)(1) of the Act, 21 U.S.C. section 360bbb-3(b)(1), unless the authorization is terminated or revoked.  Performed at [facility], [address] [place], [place] [address]   MRSA PCR Screening     Status: None   Collection Time: 09/08/20  2:26 AM   Specimen: Nasal Mucosa; Nasopharyngeal  Result Value Ref Range Status   MRSA by PCR NEGATIVE NEGATIVE Final    Comment:        The GeneXpert MRSA Assay (FDA approved for NASAL specimens only), is one component of a comprehensive MRSA  colonization surveillance program. It is not intended to diagnose MRSA infection nor to guide or monitor treatment for MRSA infections. Performed at [facility], [address] [place], [place] [address]    Today   Subjective    Zethan Alfieri today has no new complaints -Tolerating oral intake well, had BM        -No vomiting   Patient has been seen and examined prior to discharge   Objective   Blood pressure (!) 152/89, pulse 79, temperature 98.6 F (37 C), temperature source Oral, resp. rate 19, height 6' (1.829 m), weight 60 kg, SpO2 100 %.   Intake/Output Summary (Last 24 hours) at 09/12/2020 1456 Last data filed at 09/12/2020 1300 Gross per 24 hour  Intake 2725.39 ml  Output 5525 ml  Net -2799.61 ml    Exam Gen:- Awake Alert, no acute distress HEENT:- [place].AT, No sclera icterus Neck-Supple Neck,No JVD,.  Lungs-  CTAB , good air movement bilaterally  CV- S1, S2 normal, regular Abd-  +ve B.Sounds, Abd Soft, No tenderness,    Extremity/Skin:-No significant lower extremity swelling or tenderness,   good pulses Psych-affect is flat, not very talkative  neuro-generalized weakness, no new focal deficits, no tremors    Data Review   CBC w Diff:  Lab Results  Component Value Date  WBC 5.1 09/11/2020   HGB 8.3 (L) 09/11/2020   HCT 25.7 (L) 09/11/2020   PLT 329 09/11/2020   LYMPHOPCT 9 09/07/2020   MONOPCT 5 09/07/2020   EOSPCT 0 09/07/2020   BASOPCT 0 09/07/2020    CMP:  Lab Results  Component Value Date   NA 135 09/12/2020   K 3.5 09/12/2020   CL 96 (L) 09/12/2020   CO2 27 09/12/2020   BUN 9 09/12/2020   CREATININE 0.82 09/12/2020   CREATININE 1.29 (H) 12/31/2016   PROT 6.3 (L) 09/12/2020   ALBUMIN 2.8 (L) 09/12/2020   BILITOT 0.4 09/12/2020   ALKPHOS 94 09/12/2020   AST 18 09/12/2020   ALT 24 09/12/2020  .   Total Discharge time is about 33 minutes  Roxan Hockey M.D on 09/12/2020 at 2:56 PM  Go to [email or web address] -  for contact info  [facility] - Office  [phone]

## 2020-09-12 NOTE — Progress Notes (Signed)
Palliative:  Mo is sleeping and stable. Tolerating intake with no nausea/vomiting or constipation. Discussed with Dr. Denton Brick and plans for discharge if facility has already arranged for transportation to [place] appointment for Friday. Discussed briefly with legal guardian, [name], who has no further questions and agrees with plan. I did route my consult note from yesterday to Dr. Rush Landmark to give heads up of situation.   No charge  Vinie Sill, NP Palliative Medicine Team Pager [phone] (Please see [email or web address] for schedule) Team Phone [phone]    Greater than 50%  of this time was spent counseling and coordinating care related to the above assessment and plan

## 2020-09-14 ENCOUNTER — Ambulatory Visit (INDEPENDENT_AMBULATORY_CARE_PROVIDER_SITE_OTHER): Payer: Medicare Other | Admitting: Gastroenterology

## 2020-09-14 ENCOUNTER — Encounter: Payer: Self-pay | Admitting: Gastroenterology

## 2020-09-14 VITALS — BP 80/56 | HR 96

## 2020-09-14 DIAGNOSIS — R935 Abnormal findings on diagnostic imaging of other abdominal regions, including retroperitoneum: Secondary | ICD-10-CM

## 2020-09-14 DIAGNOSIS — K209 Esophagitis, unspecified without bleeding: Secondary | ICD-10-CM

## 2020-09-14 DIAGNOSIS — Z7901 Long term (current) use of anticoagulants: Secondary | ICD-10-CM

## 2020-09-14 DIAGNOSIS — R970 Elevated carcinoembryonic antigen [CEA]: Secondary | ICD-10-CM | POA: Diagnosis not present

## 2020-09-14 DIAGNOSIS — K259 Gastric ulcer, unspecified as acute or chronic, without hemorrhage or perforation: Secondary | ICD-10-CM

## 2020-09-14 DIAGNOSIS — Q453 Other congenital malformations of pancreas and pancreatic duct: Secondary | ICD-10-CM | POA: Diagnosis not present

## 2020-09-14 DIAGNOSIS — R634 Abnormal weight loss: Secondary | ICD-10-CM

## 2020-09-14 DIAGNOSIS — D132 Benign neoplasm of duodenum: Secondary | ICD-10-CM

## 2020-09-14 MED ORDER — SUPREP BOWEL PREP KIT 17.5-3.13-1.6 GM/177ML PO SOLN: 1.0000 | ORAL | 0 refills | Status: DC

## 2020-09-14 NOTE — Patient Instructions (Signed)
You have been scheduled for an endoscopy and colonoscopy. Please follow the written instructions given to you at your visit today. Please pick up your prep supplies at the pharmacy within the next 1-3 days. If you use inhalers (even only as needed), please bring them with you on the day of your procedure.   We have sent the following medications to your pharmacy for you to pick up at your convenience: [place] will be contaced by our office prior to your procedure for directions on holding your Eliquis.  If you do not hear from our office 1 week prior to your scheduled procedure, please call [phone] to discuss.  Please have labs drawn 2 weeks before procedure. Labs need to be done 11/01/20 and faxed to [phone] Attn: [place]   If you are age 38 or older, your body mass index should be between 23-30. Your There is no height or weight on file to calculate BMI. If this is out of the aforementioned range listed, please consider follow up with your Primary Care Provider.  If you are age 93 or younger, your body mass index should be between 19-25. Your There is no height or weight on file to calculate BMI. If this is out of the aformentioned range listed, please consider follow up with your Primary Care Provider.    Thank you for choosing me and [facility].  Dr. Rush Landmark

## 2020-09-15 ENCOUNTER — Encounter: Payer: Self-pay | Admitting: Gastroenterology

## 2020-09-15 ENCOUNTER — Inpatient Hospital Stay (HOSPITAL_COMMUNITY)
Admission: EM | Admit: 2020-09-15 | Discharge: 2020-09-20 | DRG: 388 | Disposition: A | Discharge status: Skilled Nursing Facility | Payer: Medicare Other | Source: Skilled Nursing Facility | Attending: Family Medicine | Admitting: Family Medicine

## 2020-09-15 ENCOUNTER — Other Ambulatory Visit: Payer: Self-pay

## 2020-09-15 ENCOUNTER — Encounter (HOSPITAL_COMMUNITY): Payer: Self-pay

## 2020-09-15 ENCOUNTER — Emergency Department (HOSPITAL_COMMUNITY): Payer: Medicare Other

## 2020-09-15 DIAGNOSIS — I82431 Acute embolism and thrombosis of right popliteal vein: Secondary | ICD-10-CM | POA: Diagnosis present

## 2020-09-15 DIAGNOSIS — E782 Mixed hyperlipidemia: Secondary | ICD-10-CM

## 2020-09-15 DIAGNOSIS — Z96642 Presence of left artificial hip joint: Secondary | ICD-10-CM | POA: Diagnosis present

## 2020-09-15 DIAGNOSIS — Z7984 Long term (current) use of oral hypoglycemic drugs: Secondary | ICD-10-CM

## 2020-09-15 DIAGNOSIS — D649 Anemia, unspecified: Secondary | ICD-10-CM

## 2020-09-15 DIAGNOSIS — I82409 Acute embolism and thrombosis of unspecified deep veins of unspecified lower extremity: Secondary | ICD-10-CM | POA: Diagnosis present

## 2020-09-15 DIAGNOSIS — K2100 Gastro-esophageal reflux disease with esophagitis, without bleeding: Secondary | ICD-10-CM | POA: Diagnosis present

## 2020-09-15 DIAGNOSIS — K259 Gastric ulcer, unspecified as acute or chronic, without hemorrhage or perforation: Secondary | ICD-10-CM

## 2020-09-15 DIAGNOSIS — Z20822 Contact with and (suspected) exposure to covid-19: Secondary | ICD-10-CM | POA: Diagnosis present

## 2020-09-15 DIAGNOSIS — Z83438 Family history of other disorder of lipoprotein metabolism and other lipidemia: Secondary | ICD-10-CM

## 2020-09-15 DIAGNOSIS — N400 Enlarged prostate without lower urinary tract symptoms: Secondary | ICD-10-CM | POA: Diagnosis present

## 2020-09-15 DIAGNOSIS — E785 Hyperlipidemia, unspecified: Secondary | ICD-10-CM | POA: Diagnosis present

## 2020-09-15 DIAGNOSIS — E7800 Pure hypercholesterolemia, unspecified: Secondary | ICD-10-CM | POA: Diagnosis present

## 2020-09-15 DIAGNOSIS — G4700 Insomnia, unspecified: Secondary | ICD-10-CM | POA: Diagnosis present

## 2020-09-15 DIAGNOSIS — K219 Gastro-esophageal reflux disease without esophagitis: Secondary | ICD-10-CM | POA: Diagnosis present

## 2020-09-15 DIAGNOSIS — I10 Essential (primary) hypertension: Secondary | ICD-10-CM | POA: Diagnosis present

## 2020-09-15 DIAGNOSIS — Z8673 Personal history of transient ischemic attack (TIA), and cerebral infarction without residual deficits: Secondary | ICD-10-CM | POA: Diagnosis not present

## 2020-09-15 DIAGNOSIS — K567 Ileus, unspecified: Secondary | ICD-10-CM | POA: Diagnosis not present

## 2020-09-15 DIAGNOSIS — E8809 Other disorders of plasma-protein metabolism, not elsewhere classified: Secondary | ICD-10-CM | POA: Diagnosis present

## 2020-09-15 DIAGNOSIS — K920 Hematemesis: Secondary | ICD-10-CM | POA: Diagnosis not present

## 2020-09-15 DIAGNOSIS — Z794 Long term (current) use of insulin: Secondary | ICD-10-CM

## 2020-09-15 DIAGNOSIS — Z79899 Other long term (current) drug therapy: Secondary | ICD-10-CM

## 2020-09-15 DIAGNOSIS — D631 Anemia in chronic kidney disease: Secondary | ICD-10-CM | POA: Diagnosis present

## 2020-09-15 DIAGNOSIS — E1165 Type 2 diabetes mellitus with hyperglycemia: Secondary | ICD-10-CM | POA: Diagnosis present

## 2020-09-15 DIAGNOSIS — R1112 Projectile vomiting: Secondary | ICD-10-CM

## 2020-09-15 DIAGNOSIS — K209 Esophagitis, unspecified without bleeding: Secondary | ICD-10-CM | POA: Diagnosis not present

## 2020-09-15 DIAGNOSIS — R1110 Vomiting, unspecified: Secondary | ICD-10-CM

## 2020-09-15 DIAGNOSIS — R970 Elevated carcinoembryonic antigen [CEA]: Secondary | ICD-10-CM | POA: Diagnosis not present

## 2020-09-15 DIAGNOSIS — I2699 Other pulmonary embolism without acute cor pulmonale: Secondary | ICD-10-CM

## 2020-09-15 DIAGNOSIS — Z9981 Dependence on supplemental oxygen: Secondary | ICD-10-CM

## 2020-09-15 DIAGNOSIS — I82501 Chronic embolism and thrombosis of unspecified deep veins of right lower extremity: Secondary | ICD-10-CM | POA: Diagnosis present

## 2020-09-15 DIAGNOSIS — Q248 Other specified congenital malformations of heart: Secondary | ICD-10-CM

## 2020-09-15 DIAGNOSIS — F0390 Unspecified dementia without behavioral disturbance: Secondary | ICD-10-CM | POA: Diagnosis present

## 2020-09-15 DIAGNOSIS — D72829 Elevated white blood cell count, unspecified: Secondary | ICD-10-CM | POA: Diagnosis not present

## 2020-09-15 DIAGNOSIS — Z8249 Family history of ischemic heart disease and other diseases of the circulatory system: Secondary | ICD-10-CM | POA: Diagnosis not present

## 2020-09-15 DIAGNOSIS — K56600 Partial intestinal obstruction, unspecified as to cause: Secondary | ICD-10-CM | POA: Diagnosis present

## 2020-09-15 DIAGNOSIS — K254 Chronic or unspecified gastric ulcer with hemorrhage: Secondary | ICD-10-CM | POA: Diagnosis present

## 2020-09-15 DIAGNOSIS — E46 Unspecified protein-calorie malnutrition: Secondary | ICD-10-CM

## 2020-09-15 DIAGNOSIS — K3189 Other diseases of stomach and duodenum: Secondary | ICD-10-CM

## 2020-09-15 DIAGNOSIS — I313 Pericardial effusion (noninflammatory): Secondary | ICD-10-CM | POA: Diagnosis not present

## 2020-09-15 LAB — COMPREHENSIVE METABOLIC PANEL
ALT: 34 U/L (ref 0–44)
AST: 24 U/L (ref 15–41)
Albumin: 3.3 g/dL — ABNORMAL LOW (ref 3.5–5.0)
Alkaline Phosphatase: 102 U/L (ref 38–126)
Anion gap: 14 (ref 5–15)
BUN: 25 mg/dL — ABNORMAL HIGH (ref 8–23)
CO2: 29 mmol/L (ref 22–32)
Calcium: 11.6 mg/dL — ABNORMAL HIGH (ref 8.9–10.3)
Chloride: 92 mmol/L — ABNORMAL LOW (ref 98–111)
Creatinine, Ser: 1.16 mg/dL (ref 0.61–1.24)
GFR, Estimated: >60 mL/min (ref >60)
Glucose, Bld: 221 mg/dL — ABNORMAL HIGH (ref 70–99)
Potassium: 4.8 mmol/L (ref 3.5–5.1)
Sodium: 135 mmol/L (ref 135–145)
Total Bilirubin: 0.5 mg/dL (ref 0.3–1.2)
Total Protein: 7.1 g/dL (ref 6.5–8.1)

## 2020-09-15 LAB — CBC WITH DIFFERENTIAL/PLATELET
Abs Immature Granulocytes: 0.09 10*3/uL — ABNORMAL HIGH (ref 0.00–0.07)
Basophils Absolute: 0 10*3/uL (ref 0.0–0.1)
Basophils Relative: 0 %
Eosinophils Absolute: 0 10*3/uL (ref 0.0–0.5)
Eosinophils Relative: 0 %
HCT: 36.6 % — ABNORMAL LOW (ref 39.0–52.0)
Hemoglobin: 11.7 g/dL — ABNORMAL LOW (ref 13.0–17.0)
Immature Granulocytes: 1 %
Lymphocytes Relative: 4 %
Lymphs Abs: 0.6 10*3/uL — ABNORMAL LOW (ref 0.7–4.0)
MCH: 27.1 pg (ref 26.0–34.0)
MCHC: 32 g/dL (ref 30.0–36.0)
MCV: 84.9 fL (ref 80.0–100.0)
Monocytes Absolute: 0.7 10*3/uL (ref 0.1–1.0)
Monocytes Relative: 5 %
Neutro Abs: 11.9 10*3/uL — ABNORMAL HIGH (ref 1.7–7.7)
Neutrophils Relative %: 90 %
Platelets: 399 10*3/uL (ref 150–400)
RBC: 4.31 MIL/uL (ref 4.22–5.81)
RDW: 16.5 % — ABNORMAL HIGH (ref 11.5–15.5)
WBC: 13.3 10*3/uL — ABNORMAL HIGH (ref 4.0–10.5)
nRBC: 0 % (ref 0.0–0.2)

## 2020-09-15 LAB — LACTIC ACID, PLASMA: Lactic Acid, Venous: 1.8 mmol/L (ref 0.5–1.9)

## 2020-09-15 LAB — AMMONIA: Ammonia: 16 umol/L (ref 9–35)

## 2020-09-15 LAB — PROTIME-INR
INR: 1.1 (ref 0.8–1.2)
Prothrombin Time: 13.8 seconds (ref 11.4–15.2)

## 2020-09-15 LAB — LIPASE, BLOOD: Lipase: 34 U/L (ref 11–51)

## 2020-09-15 IMAGING — CT CT ABD-PELV W/ CM
2 of 5 series · 16 of 46 positions shown, 18 images · IV contrast (omnipaque)
Comparison: [DATE]

CLINICAL DATA: Gastrointestinal bleeding. Projectile vomiting with
coffee-ground brown emesis.

EXAM:
CT ABDOMEN AND PELVIS WITH CONTRAST
TECHNIQUE: Multidetector CT imaging of the abdomen and pelvis was performed
using the standard protocol following bolus administration of
intravenous contrast.
CONTRAST:  100mL OMNIPAQUE 300

[Series 6: axial st · axial · 0.83mm/px · z∈[+676,+1102]mm · 13 of 97 slices shown, 15 images]
[im 6/97  soft-tissue]
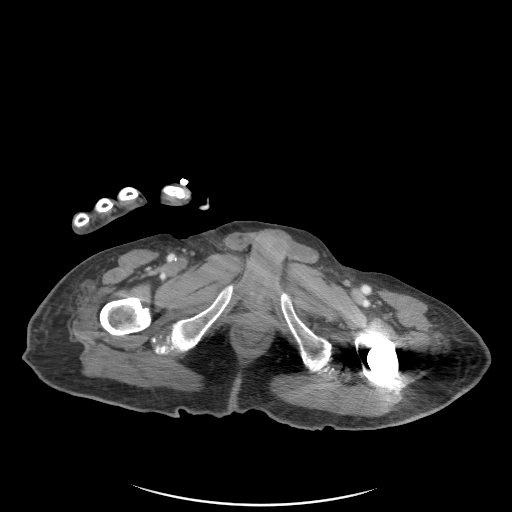
[im 6/97  bone]
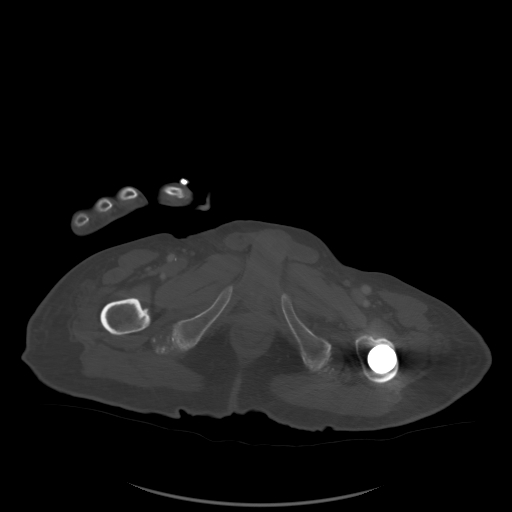
[im 11/97  soft-tissue]
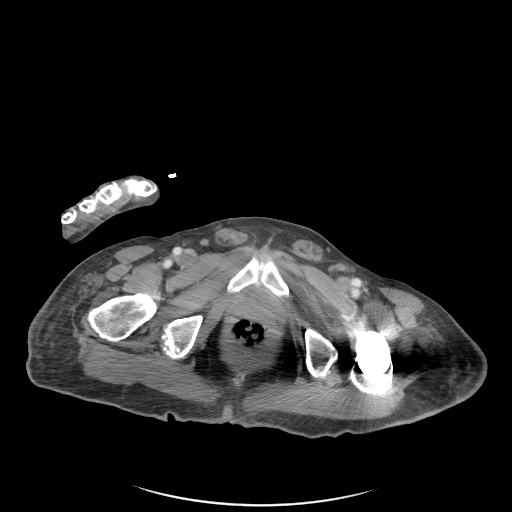
[im 22/97  soft-tissue]
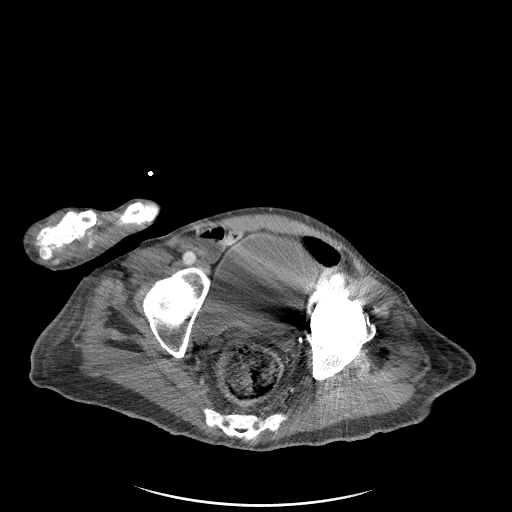
[im 27/97  soft-tissue]
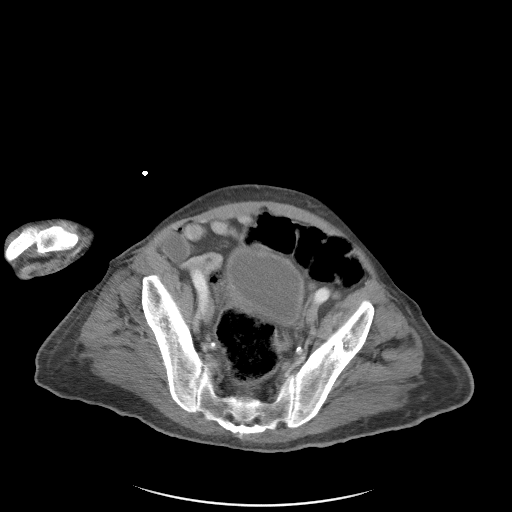
[im 33/97  soft-tissue]
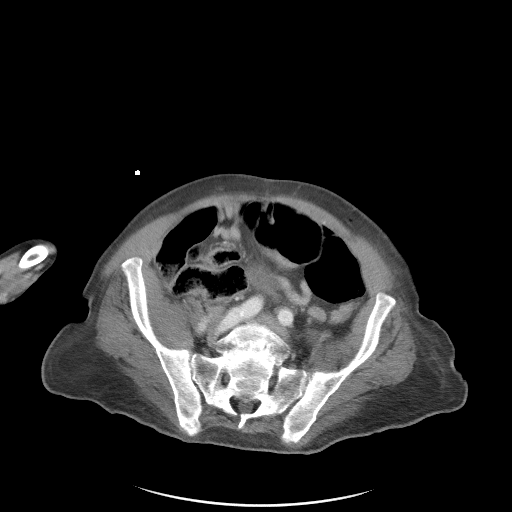
[im 43/97  soft-tissue]
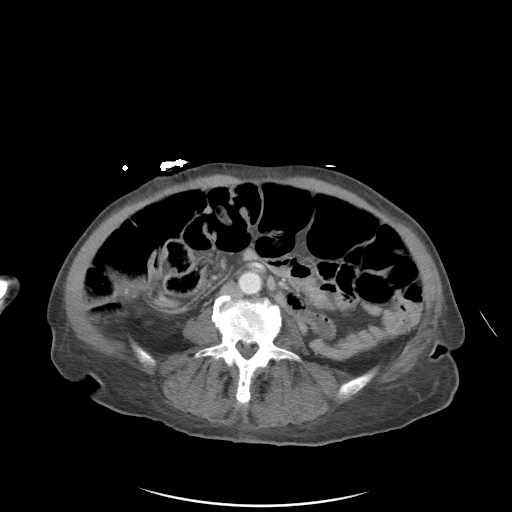
[im 49/97  soft-tissue]
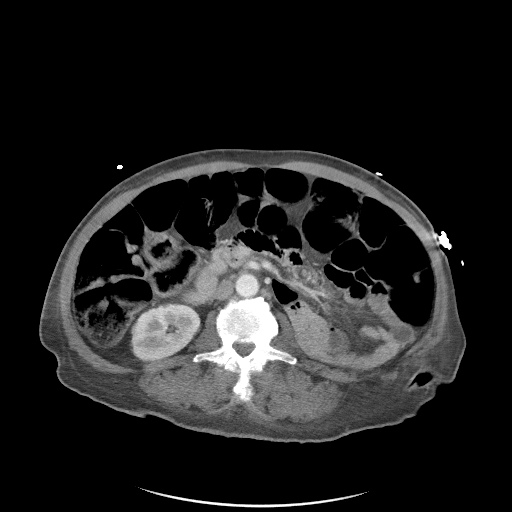
[im 54/97  soft-tissue]
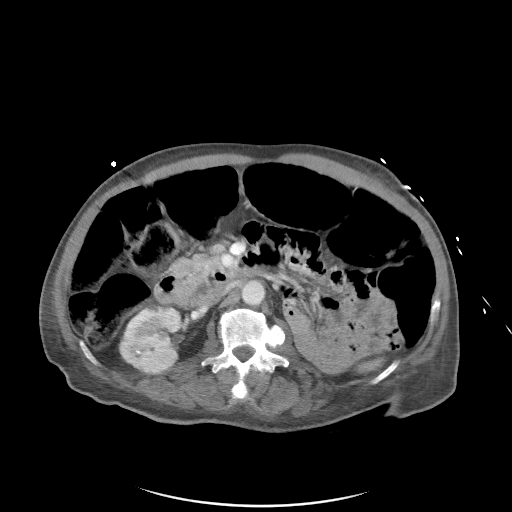
[im 65/97  soft-tissue]
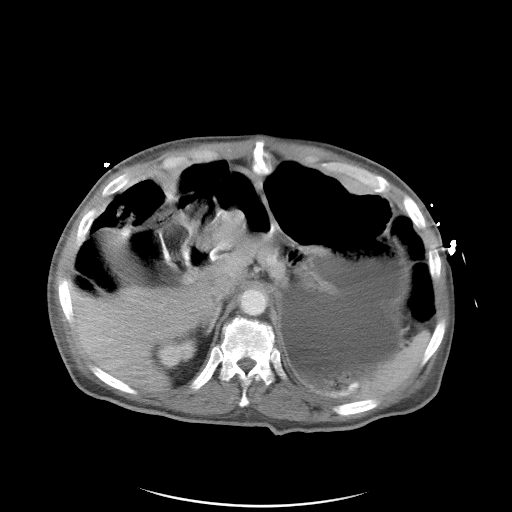
[im 65/97  bone]
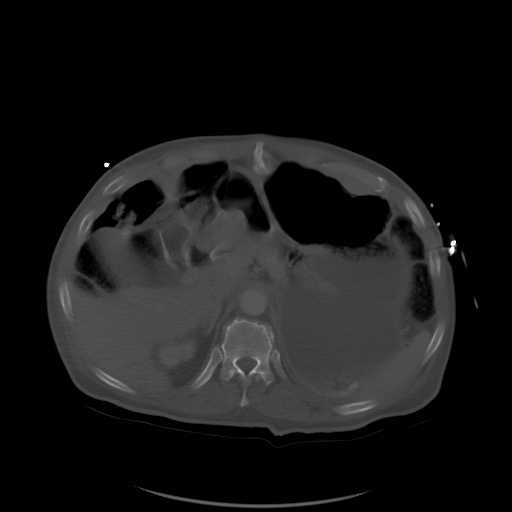
[im 70/97  soft-tissue]
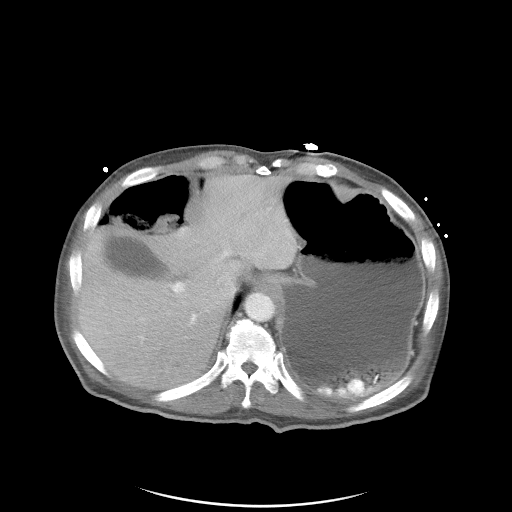
[im 75/97  soft-tissue]
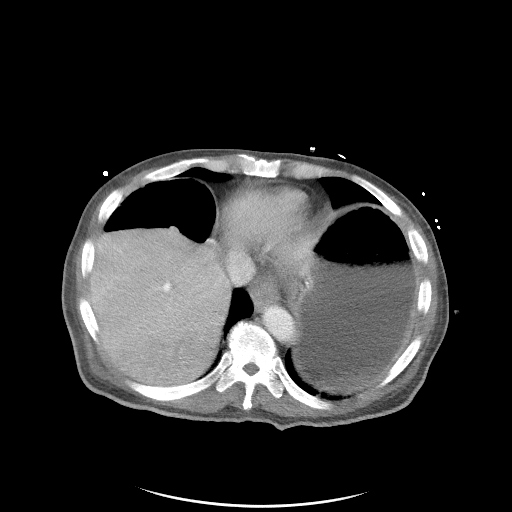
[im 86/97  soft-tissue]
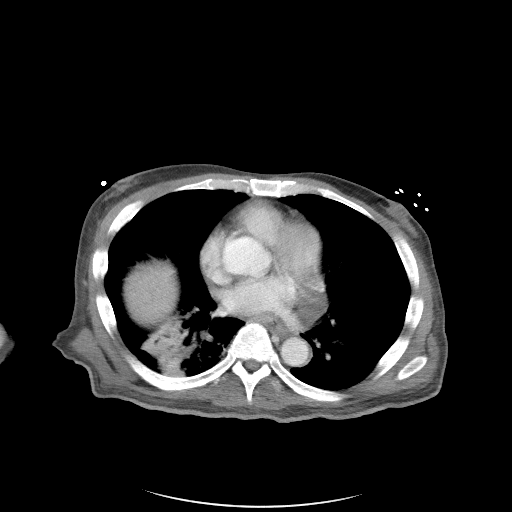
[im 91/97  soft-tissue]
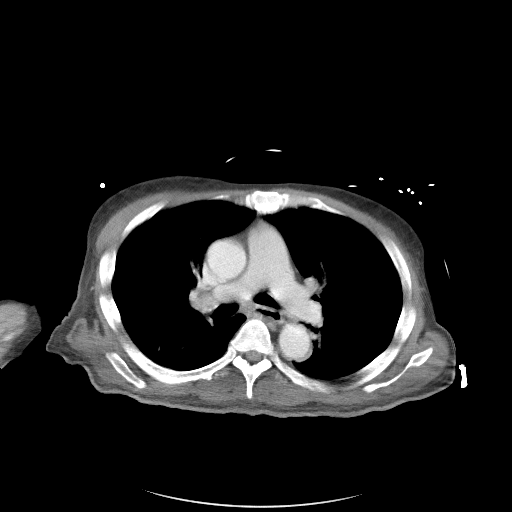

[Series 9: coronal st · coronal · 0.82mm/px · 3 of 117 slices shown]
[im 39/117  soft-tissue]
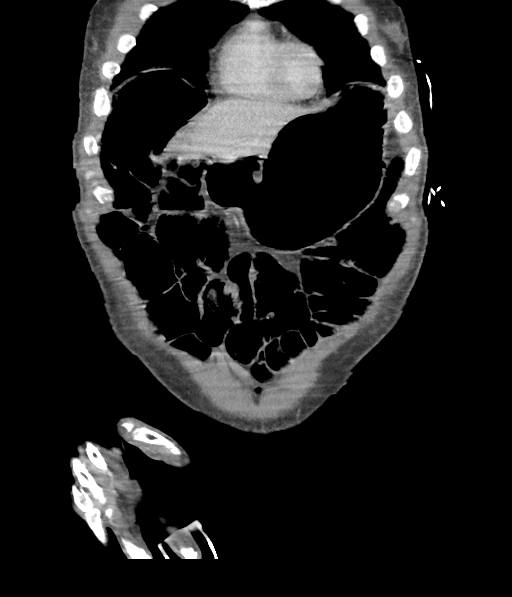
[im 52/117  soft-tissue]
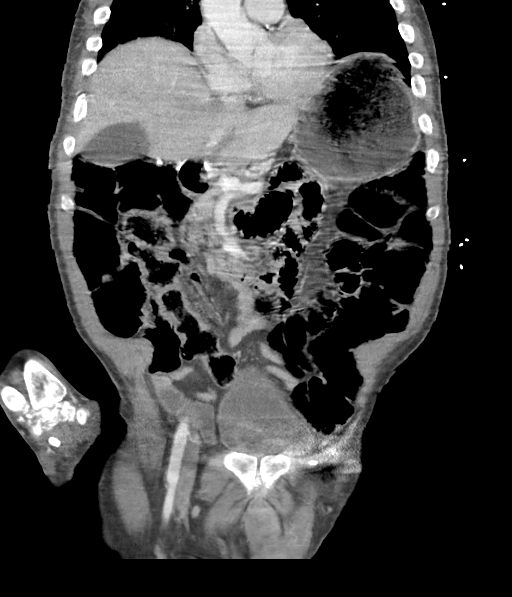
[im 65/117  soft-tissue]
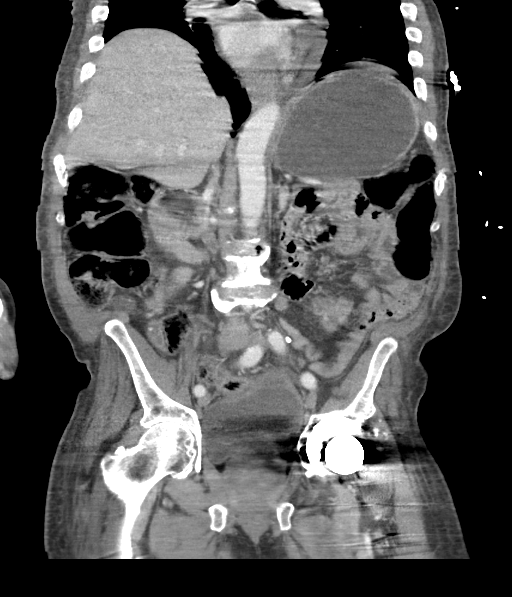

[16 of 46 positions shown; findings below may reference images not displayed]

FINDINGS: Lower chest: Stable 5.6 x 2.4 cm oval area of fluid density adjacent
to the posterolateral aspect of the heart on the left on image
number [DATE]. Diffuse low density distal esophageal wall thickening
with improvement. Normal sized heart. Atheromatous coronary artery
calcifications. Patchy densities in the right lower lobe with some
consolidation with mild improvement. Patchy densities in the
posterior right upper lobe with mild improvement. Small amount of
interval dependent patchy density in the left lower lobe. No pleural
fluid.

Hepatobiliary: No focal liver abnormality is seen. No gallstones,
gallbladder wall thickening, or biliary dilatation.

Pancreas: Stable normal appearing pancreatic head and proximal neck.
The remainder of the pancreas is severely atrophied/absent.

Spleen: Normal in size without focal abnormality.

Adrenals/Urinary Tract: Normal appearing adrenal glands. Stable
right renal cysts and small parenchymal calcification. Stable
possible small, severely atrophied left kidney. Unremarkable urinary
bladder and right ureter.

Stomach/Bowel: Stable dilated stomach and colon. Normal caliber
small bowel. No evidence of appendicitis.

Vascular/Lymphatic: No significant vascular findings are present. No
enlarged abdominal or pelvic lymph nodes.

Reproductive: Prostate is unremarkable.

Other: Small umbilical hernia containing fat.

Musculoskeletal: Stable left hip prosthesis. Lumbar and lower
thoracic spine degenerative changes. Stable bilateral L5 pars
defects with grade 2 anterolisthesis and vertebral body fusion at
the L5-S1 level.
IMPRESSION: 1. No acute abnormality.
2. Improving distal esophagitis.
3. Stable dilated stomach and colon, compatible with ileus. No
findings to indicate interval gastric outlet obstruction.
4. Mildly improved bilateral lung opacities, compatible with
improving pneumonia.
5. Stable 5.6 x 2.4 cm oval area of fluid density adjacent to the
posterolateral aspect of the heart on the left, compatible with a
pericardial cyst.
6. Stable bilateral L5 spondylolysis with grade 2 spondylolisthesis
and vertebral body fusion at the L5-S1 level.
7. Stable possible small, severely atrophied left kidney.

## 2020-09-15 IMAGING — DX DG CHEST 1V PORT
1 series · 1 of 1 positions shown · non-contrast
Comparison: [DATE]

CLINICAL DATA: Coffee-ground emesis

EXAM:
PORTABLE CHEST 1 VIEW

[chest ap]
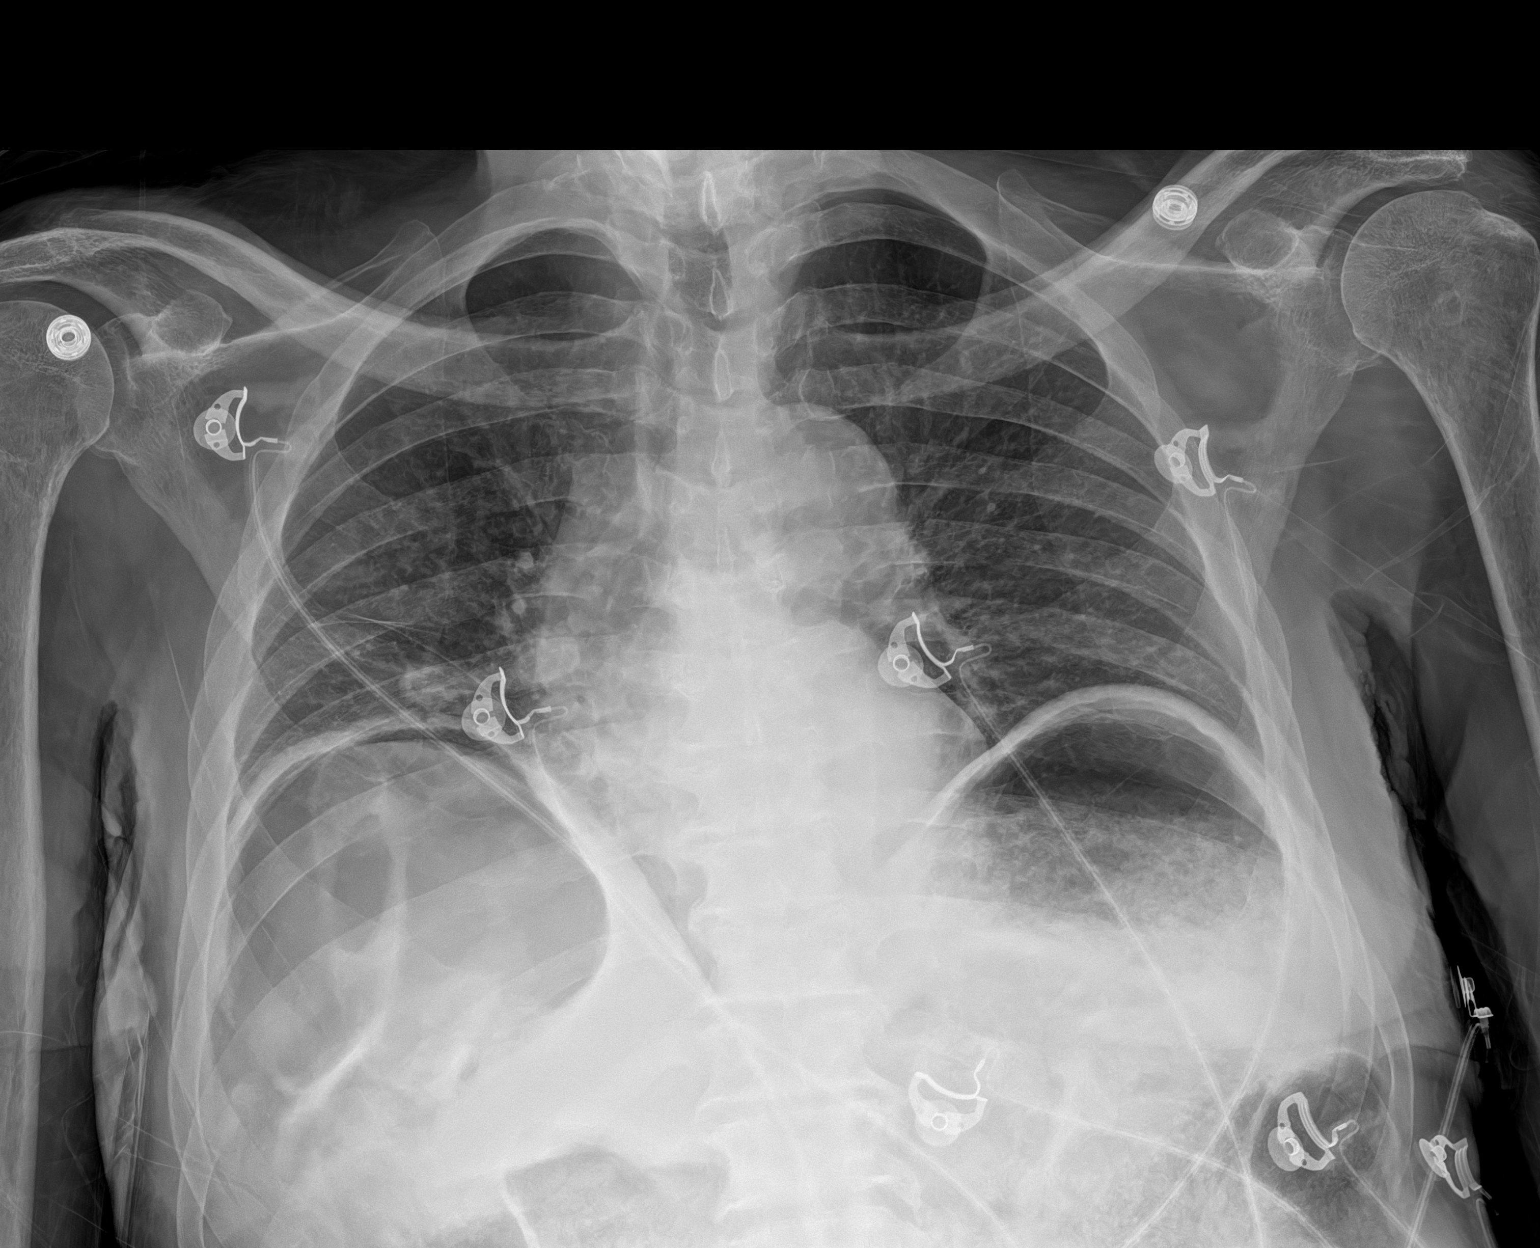

[1 of 1 positions shown; findings below may reference images not displayed]

FINDINGS: The heart size and mediastinal contours are within normal limits.
Both lungs are clear. Unchanged elevation of the bilateral
hemidiaphragms with associated scarring or atelectasis. The
visualized skeletal structures are unremarkable.
IMPRESSION: No acute abnormality of the lungs in AP portable projection.

## 2020-09-15 MED ORDER — DIPHENHYDRAMINE HCL 50 MG/ML IJ SOLN
25.0000 mg | Freq: Once | INTRAMUSCULAR | Status: AC
  Administered 2020-09-15: 25 mg via INTRAVENOUS
  Filled 2020-09-15: qty 1

## 2020-09-15 MED ORDER — SODIUM CHLORIDE 0.9 % IV SOLN
80.0000 mg | Freq: Once | INTRAVENOUS | Status: AC
  Administered 2020-09-15: 80 mg via INTRAVENOUS
  Filled 2020-09-15: qty 80

## 2020-09-15 MED ORDER — METOCLOPRAMIDE HCL 5 MG/ML IJ SOLN
5.0000 mg | Freq: Once | INTRAMUSCULAR | Status: AC
  Administered 2020-09-15: 5 mg via INTRAVENOUS
  Filled 2020-09-15: qty 2

## 2020-09-15 MED ORDER — SODIUM CHLORIDE 0.9 % IV BOLUS
1000.0000 mL | Freq: Once | INTRAVENOUS | Status: AC
  Administered 2020-09-15: 1000 mL via INTRAVENOUS

## 2020-09-15 MED ORDER — ONDANSETRON HCL 4 MG/2ML IJ SOLN
4.0000 mg | Freq: Once | INTRAMUSCULAR | Status: AC
Start: 2020-09-15 — End: 2020-09-15
  Administered 2020-09-15: 4 mg via INTRAVENOUS
  Filled 2020-09-15: qty 2

## 2020-09-15 MED ORDER — IOHEXOL 300 MG/ML  SOLN
100.0000 mL | Freq: Once | INTRAMUSCULAR | Status: AC | PRN
  Administered 2020-09-15: 100 mL via INTRAVENOUS

## 2020-09-15 MED ORDER — SODIUM CHLORIDE 0.9 % IV SOLN
8.0000 mg/h | INTRAVENOUS | Status: AC
  Administered 2020-09-15 – 2020-09-18 (×6): 8 mg/h via INTRAVENOUS
  Filled 2020-09-15 (×8): qty 80

## 2020-09-15 MED ORDER — IOPAMIDOL (ISOVUE-300) INJECTION 61%: 80.0000 mL | Freq: Once | INTRAVENOUS | Status: DC | PRN

## 2020-09-15 NOTE — Progress Notes (Signed)
[facility] VISIT   Primary Care Provider Leonie Douglas, MD [address] [place] [id number] [phone]  Referring Provider Dr. Jenetta Downer  Patient Profile: Daniel [name] is a 70 y.o. male with a pmh significant for CVA (previously on Plavix), acute DVT (on apixaban), hypertension, hyperlipidemia, diabetes, osteoarthritis, hypomagnesemia, esophagitis, gastric ulcers, duodenal adenoma, possible partial small bowel obstruction.  The patient presents to the [facility] for an evaluation and management of problem(s) noted below:  Problem List 1. Adenomatous duodenal polyp   2. Elevated CEA   3. Pancreatic abnormality   4. Multiple gastric ulcers   5. Esophagitis determined by endoscopy   6. Unintentional weight loss   7. Chronic anticoagulation     History of Present Illness This is the patient's first visit to the outpatient [facility].  He is followed by the [facility] (Dr. Jenetta Downer).  The patient was admitted to the hospital in January in the setting of anemia and progressive weight loss and chest pain.  He eventually underwent an upper endoscopy with findings of significant esophagitis, multiple large ulcers of the stomach and gastritis as well as finding of a polypoid mass that was adenomatous appearing in the duodenum.  The patient was transitioned off his Plavix.  Patient was eventually discharged.  He followed up in outpatient clinic and then was referred for consideration of advanced resection attempt of the polypoid lesion in the duodenum.  Unfortunately the patient has been hospitalized a few times with persistent anemia as well as issues most recently of a potential partial small bowel obstruction.  Laboratories were obtained at his outpatient center he was found to have a CEA level that was elevated to 3.5.  Hemoglobin has been monitored.  His hypomagnesemia has persisted.  His PPI therapy was stopped in the  setting/concern of it causing his hypomagnesemia.  The patient is accompanied by his Education officer, museum and power of attorney today.  She describes a significant weight loss since August of nearly 40 pounds however things have stabilized in the last 3 to 4 months since his endoscopy.  Patient's speech language pathologist has not noted any issues of overt regurgitation.  However, the patient's POA is certainly seen a significant decline in the patient's overall health within the last 6 months, mostly since there is CVA.  During his most recent hospitalization he was diagnosed with a new lower extremity DVT and started on anticoagulation.  He was just discharged 2 days ago to come today's clinic, however there was some mistake noted that the patient actually was not scheduled for endoscopy but rather just a clinic visit.  Patient has no complaints at this time.  It is not clear if the patient has ever had a colonoscopy based on what the POA knows from history.  GI Review of Systems Positive as above Negative for dysphagia, odynophagia, melena, hematochezia, change in bowel habits  Review of Systems General: Denies fevers/chills HEENT: Denies oral lesions Cardiovascular: Denies chest pain Pulmonary: Denies shortness of breath Gastroenterological: See HPI Genitourinary: Denies darkened urine Hematological: Positive for history of easy bruising/bleeding due to prior antiplatelet therapy and now anticoagulation Dermatological: Denies jaundice Psychological: Mood is stable   Medications No current facility-administered medications for this visit.   No current outpatient medications on file.   Facility-Administered Medications Ordered in Other Visits  Medication Dose Route Frequency Provider Last Rate Last Admin  . 0.9 %  sodium chloride infusion   Intravenous Continuous Rehman, Najeeb  U, MD 50 mL/hr at 09/16/20 1718 Infusion Verify at 09/16/20 1718  . aMILoride (MIDAMOR) tablet 5 mg  5 mg Oral Daily  Emokpae, Courage, MD   5 mg at 09/17/20 1112  . amitriptyline (ELAVIL) tablet 10 mg  10 mg Oral QHS Emokpae, Courage, MD   10 mg at 09/17/20 2231  . atorvastatin (LIPITOR) tablet 40 mg  40 mg Oral QPM Emokpae, Courage, MD   40 mg at 09/17/20 1643  . donepezil (ARICEPT) tablet 5 mg  5 mg Oral QHS Emokpae, Courage, MD   5 mg at 09/17/20 2231  . insulin aspart (novoLOG) injection 0-9 Units  0-9 Units Subcutaneous Q4H Adefeso, Oladapo, DO   1 Units at 09/18/20 0020  . iopamidol (ISOVUE-300) 61 % injection 80 mL  80 mL Intravenous Once PRN Adefeso, Oladapo, DO      . memantine (NAMENDA) tablet 5 mg  5 mg Oral Daily Emokpae, Courage, MD   5 mg at 09/17/20 1049  . metoCLOPramide (REGLAN) injection 5 mg  5 mg Intravenous TID AC Rehman, Najeeb U, MD   5 mg at 09/17/20 1642  . metoprolol tartrate (LOPRESSOR) tablet 25 mg  25 mg Oral BID Roxan Hockey, MD   25 mg at 09/17/20 2231  . ondansetron (ZOFRAN) injection 4 mg  4 mg Intravenous Q6H PRN Adefeso, Oladapo, DO      . pantoprazole (PROTONIX) 80 mg in sodium chloride 0.9 % 100 mL (0.8 mg/mL) infusion  8 mg/hr Intravenous Continuous Adefeso, Oladapo, DO 10 mL/hr at 09/17/20 2317 8 mg/hr at 09/17/20 2317  . phosphorus (K PHOS NEUTRAL) tablet 250 mg  250 mg Oral TID Roxan Hockey, MD   250 mg at 09/17/20 2231  . polyethylene glycol (MIRALAX / GLYCOLAX) packet 17 g  17 g Oral BID Denton Brick, Courage, MD   17 g at 09/17/20 2231  . senna-docusate (Senokot-S) tablet 2 tablet  2 tablet Oral QHS Roxan Hockey, MD   2 tablet at 09/17/20 2231    Allergies No Known Allergies  Histories Past Medical History:  Diagnosis Date  . Acute kidney failure ([place])   . Acute respiratory failure ([place])   . Adenomatous polyp of stomach   . Anemia   . Benign prostate hyperplasia   . Dementia ([place])   . Depression   . Diabetes mellitus without complication ([place])   . DVT (deep venous thrombosis) ([place])   . Gastric ulcer   . GERD (gastroesophageal reflux disease)   .  High cholesterol   . Hypertension   . Hypo-osmolality and hyponatremia   . Insomnia   . Neuromuscular dysfunction of bladder   . Osteoarthritis   . Partial intestinal obstruction ([place])   . Pneumonitis   . Protein-calorie malnutrition, moderate ([place])   . Stroke [facility])    Past Surgical History:  Procedure Laterality Date  . BIOPSY  06/21/2020   Procedure: BIOPSY;  Surgeon: Daneil Dolin, MD;  Location: [facility];  Service: Endoscopy;;  gastric  . ESOPHAGOGASTRODUODENOSCOPY (EGD) WITH PROPOFOL N/A 06/21/2020   Procedure: ESOPHAGOGASTRODUODENOSCOPY (EGD) WITH PROPOFOL;  Surgeon: Daneil Dolin, MD;  Location: [facility];  Service: Endoscopy;  Laterality: N/A;  . HIP SURGERY Left   . KIDNEY SURGERY     kidney removed   Social History   Socioeconomic History  . Marital status: Single    Spouse name: Not on file  . Number of children: Not on file  . Years of education: Not on file  . Highest education  level: Not on file  Occupational History  . Not on file  Tobacco Use  . Smoking status: Never Smoker  . Smokeless tobacco: Never Used  Vaping Use  . Vaping Use: Never used  Substance and Sexual Activity  . Alcohol use: No  . Drug use: No  . Sexual activity: Never  Other Topics Concern  . Not on file  Social History Narrative  . Not on file   Social Determinants of Health   Financial Resource Strain: Not on file  Food Insecurity: Not on file  Transportation Needs: Not on file  Physical Activity: Not on file  Stress: Not on file  Social Connections: Not on file  Intimate Partner Violence: Not on file   Family History  Problem Relation Age of Onset  . Hypertension Sister   . Hyperlipidemia Sister   . Osteoarthritis Sister   . Rheum arthritis Sister   . Breast cancer Niece   . Colon cancer Neg Hx   . Esophageal cancer Neg Hx   . Inflammatory bowel disease Neg Hx   . Liver disease Neg Hx   . Pancreatic cancer Neg Hx   . Stomach cancer Neg Hx   . Rectal  cancer Neg Hx    I have reviewed his medical, social, and family history in detail and updated the electronic medical record as necessary.    PHYSICAL EXAMINATION  BP (!) 80/56 (BP Location: Left Arm, Patient Position: Sitting, Cuff Size: Normal)   Pulse 96 Comment: irregular Wt Readings from Last 3 Encounters:  09/15/20 146 lb 9.6 oz (66.5 kg)  09/09/20 132 lb 4.4 oz (60 kg)  08/28/20 150 lb (68 kg)  GEN: NAD, appears older than stated age, appears chronically ill but is nontoxic, accompanied by power of attorney PSYCH: Cooperative EYE: Conjunctivae pink, sclerae anicteric ENT: MMM CV: Nontachycardic RESP: No audible wheezing GI: NABS, soft, NT/ND, without rebound or guarding MSK/EXT: Lower extremity edema present SKIN: No jaundice NEURO:  Alert & Oriented x 2 (person and place)    REVIEW OF DATA  I reviewed the following data at the time of this encounter:  GI Procedures and Studies  January 2022 EGD -Moderately severe reflux esophagitis. -Multiple gastric ulcers without bleeding stigmata and associated gastric erosions. Patent pylorus. -Status post gastric mucosal biopsy -Duodenal mass.  Laboratory Studies  Reviewed those in epic  Outside records March 2022 labs CEA 3.5 (upper limit of normal 2.5 if non-smoker) WBC 7.7 Hemoglobin 9.9 Hematocrit 28.7 Platelets 263 MCV 86.8 Sodium 137 Potassium 4.1 BUN/creatinine 30.7/0.87 Phosphorus 1.2 Magnesium 0.7  Imaging Studies  April 2022 CT abdomen pelvis with contrast IMPRESSION: 1. Interval marked circumferential wall thickening of the distal esophagus, probably due to esophagitis. Marked fluid distension of the stomach without evidence for small bowel obstruction, question functional obstruction. Consider NG tube decompression. 2. Generalized distension of the colon with large feces throughout. More focally dilated loop of bowel in the right upper quadrant appears to represent dilated segment of sigmoid colon.  Slightly aberrant course of sigmoid colon which appears slightly narrowed at the right lower quadrant with adjacent slightly narrowed segment of rectosigmoid colon, question some degree of colon obstruction, potentially from internal hernia. No corkscrew appearance to suggest a volvulus. Considerable stool within the rectosigmoid colon distal to this suggesting that obstruction may be partial. Note that a similar configuration of the colon was present on CT from 07/27/2020 3. Ground-glass density and patchy consolidation in the right lower lobe and posterior right upper lobe concerning  for pneumonia or aspiration. 4. Absent left kidney  March 2022 CT abdomen pancreas protocol IMPRESSION: 1. No pancreatic ductal dilatation. 2. No acute findings in the abdomen. 3. Incidental findings, as above.   ASSESSMENT  Mr. Colin is a 70 y.o. male with a pmh significant for CVA (previously on Plavix), acute DVT (on apixaban), hypertension, hyperlipidemia, diabetes, osteoarthritis, hypomagnesemia, esophagitis, gastric ulcers, duodenal adenoma, possible partial small bowel obstruction.  The patient is seen today for evaluation and management of:  1. Adenomatous duodenal polyp   2. Elevated CEA   3. Pancreatic abnormality   4. Multiple gastric ulcers   5. Esophagitis determined by endoscopy   6. Unintentional weight loss   7. Chronic anticoagulation    The patient is hemodynamically stable at this moment though has recently been hospitalized with potential partial small bowel obstruction.  The patient is followed by gastroenterology in [place].  The patient has been referred for consideration of advanced polyp resection.  Focusing solely on this particular concern, based upon the description and endoscopic pictures I do feel that it is reasonable to pursue an Advanced Polypectomy attempt of the polyp/lesion.  We discussed some of the techniques of advanced polypectomy which include Endoscopic  Mucosal Resection, OVESCO Full-Thickness Resection, Endorotor Morcellation, and Tissue Ablation via Fulguration.  We also reviewed images of typical techniques as noted above.  The risks and benefits of endoscopic evaluation were discussed with the patient; these include but are not limited to the risk of perforation, infection, bleeding, missed lesions, lack of diagnosis, severe illness requiring hospitalization, as well as anesthesia and sedation related illnesses.  During attempts at advanced resection, the risks of bleeding and perforation/leak are increased as opposed to diagnostic and screening procedures, and that was discussed with the patient as well.   In addition, I explained that with the possible need for piecemeal resection, subsequent short-interval endoscopic evaluation for follow up and potential retreatment of the lesion/area may be necessary.  I did offer, a referral to surgery in order for patient to have opportunity to discuss surgical management/intervention prior to finalizing decision for attempt at endoscopic removal, however, the patient deferred on this.  If, after attempt at removal of the polyp/lesion, it is found that the patient has a complication or that an invasive lesion or malignant lesion is found, or that the polyp/lesion continues to recur, the patient is aware and understands that surgery may still be indicated/required.  In regards to the patient's elevated CEA level, colonoscopy is recommended.  The hardest part of this whole situation is the patient's significant decline within the last few months since his stroke.  With the recent blood clot being found, off anticoagulation is going to be hard.  I would recommend that we hold on any sort of hold of anticoagulation for at least 6 weeks but it certainly is up to his primary providers at his facility to decide whether he can come off anticoagulation or not.  I am concerned that he will likely be rehospitalized with issues  before 6 weeks but we will have to see how he does.  If the patient can tolerate an upper and lower endoscopic evaluation then I will move forward with this and at least 6 weeks as long as we get approval for holding of his anticoagulation.  All POA questions were answered, to the best of my ability, and she agrees to the aforementioned plan of action with follow-up as indicated.   PLAN  EGD with EMR plus diagnostic colonoscopy  to be scheduled no sooner than 6 weeks from now because of recent acute DVT Preprocedure labs to be obtained 1 week before procedure Will try to obtain approval for holding anticoagulation for 2 days prior to procedures based on his primary provider at facility decision Restart PPI therapy 40 mg daily Hopefully will not be rehospitalized before then   Orders Placed This Encounter  Procedures  . Procedural/ Surgical Case Request: COLONOSCOPY WITH PROPOFOL, ESOPHAGOGASTRODUODENOSCOPY (EGD) WITH PROPOFOL, ENDOSCOPIC MUCOSAL RESECTION  . CBC  . Basic Metabolic Panel (BMET)  . Ambulatory referral to Gastroenterology    New Prescriptions   NA SULFATE-K SULFATE-MG SULF (SUPREP BOWEL PREP KIT) 17.5-3.13-1.6 GM/177ML SOLN    Take 1 kit by mouth as directed. For colonoscopy prep   Modified Medications   No medications on file    Planned Follow Up No follow-ups on file.   Total Time in Face-to-Face and in Coordination of Care for patient including independent/personal interpretation/review of prior testing, medical history, examination, medication adjustment, communicating results with the patient directly, and documentation with the EHR is 50 minutes.   Justice Britain, MD [facility] Advanced Endoscopy Office # [phone]

## 2020-09-15 NOTE — ED Provider Notes (Signed)
[facility] EMERGENCY DEPARTMENT Provider Note   CSN: [id number] Arrival date & time: 09/15/20  1806     History Chief Complaint  Patient presents with  . Emesis    Daniel [name] is a 70 y.o. male with a past medical history of hypertension, hyperlipidemia, type 2 diabetes.  He is a ward of the state and lives at the [facility].  Patient was recently admitted to the hospital with DVT, bowel obstruction secondary to severe constipation, and hematemesis.  Patient is a very poor historian history is gathered primarily from nursing intake and review of EMR.  Patient was also recently admitted for GI bleed.  He was started on Lovenox during his stay for the DVT and discontinued at discharge on the sixth prior to having an EGD done yesterday by Dr. Rush Landmark where he had a biopsy of an adenomatous duodenal polyp.  He has a known elevated CEA.  He was found to have moderately severe reflux esophagitis multiple gastric older ulcers.  Patient was sent in today from his facility after having what was described as projectile vomiting of coffee-ground emesis x2.  Notably mildly hyper hypotensive upon arrival and tachycardic.   HPI     Past Medical History:  Diagnosis Date  . Acute kidney failure ([place])   . Acute respiratory failure ([place])   . Adenomatous polyp of stomach   . Anemia   . Benign prostate hyperplasia   . Dementia ([place])   . Depression   . Diabetes mellitus without complication ([place])   . DVT (deep venous thrombosis) ([place])   . Gastric ulcer   . GERD (gastroesophageal reflux disease)   . High cholesterol   . Hypertension   . Hypo-osmolality and hyponatremia   . Insomnia   . Neuromuscular dysfunction of bladder   . Osteoarthritis   . Partial intestinal obstruction ([place])   . Pneumonitis   . Protein-calorie malnutrition, moderate ([place])   . Stroke [facility])     Patient Active Problem List   Diagnosis Date Noted  . Partial bowel obstruction ([place])   . Chronic constipation   . DVT  (deep vein thrombosis) in pregnancy   . Gastric distention   . Abdominal distention 09/08/2020  . Aspiration pneumonia ([place]) 09/08/2020  . Hypercalcemia 09/08/2020  . Multiple gastric ulcers 07/24/2020  . Esophagitis 07/24/2020  . 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 Anemia   . Upper abdominal pain   . Abnormal CT scan, esophagus   . Abnormal CT scan, stomach   . Pancreatic abnormality   . Constipation   . Hyperglycemia   . History of stroke   . Hypokalemia   . Hypomagnesemia   . AKI (acute kidney injury) ([place]) 06/06/2020  . Acute CVA (cerebrovascular accident) ([place]) 01/05/2020  . Right knee pain 01/01/2017  . Osteoarthritis of right knee 01/01/2017  . Mental impairment 09/23/2016  . Hypertension 09/23/2016  . Type 2 diabetes mellitus without complication, without long-term current use of insulin ([place]) 09/23/2016  . BPH (benign prostatic hyperplasia) 09/23/2016  . Dementia ([place]) 09/23/2016  . 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 BIOPSY  06/21/2020   Procedure: BIOPSY;  Surgeon: Daneil Dolin, MD;  Location: [facility];  Service: Endoscopy;;  gastric  . ESOPHAGOGASTRODUODENOSCOPY (EGD) WITH PROPOFOL N/A 06/21/2020   Procedure: ESOPHAGOGASTRODUODENOSCOPY (EGD) WITH PROPOFOL;  Surgeon: Daneil Dolin, MD;  Location: [facility] END[facility];  Service: Endoscopy;  Laterality: N/A;  . HIP  SURGERY Left   . KIDNEY SURGERY     kidney removed       Family History  Problem Relation Age of Onset  . Hypertension Sister   . Hyperlipidemia Sister   . Osteoarthritis Sister   . Rheum arthritis Sister   . Breast cancer Niece   . Colon cancer Neg Hx   . Esophageal cancer Neg Hx   . Inflammatory bowel disease Neg Hx   . Liver disease Neg Hx   . Pancreatic cancer Neg Hx   . Stomach cancer Neg Hx   . Rectal cancer Neg Hx     Social History   Tobacco Use  . Smoking status: Never Smoker  . Smokeless tobacco: Never Used  Vaping  Use  . Vaping Use: Never used  Substance Use Topics  . Alcohol use: No  . Drug use: No    Home Medications Prior to Admission medications   Medication Sig Start Date End Date Taking? Authorizing Provider  acetaminophen (TYLENOL) 325 MG tablet Take 2 tablets (650 mg total) by mouth every 6 (six) hours as needed for mild pain, fever or headache (or Fever >/= 101). 08/30/20   Emokpae, Courage, MD  aMILoride (MIDAMOR) 5 MG tablet Take 1 tablet (5 mg total) by mouth daily. 08/31/20   Roxan Hockey, MD  amitriptyline (ELAVIL) 10 MG tablet Take 10 mg by mouth at bedtime. 04/30/20   [provider]  amoxicillin-clavulanate (AUGMENTIN) 875-125 MG tablet Take 1 tablet by mouth 2 (two) times daily for 3 days. 09/12/20 09/15/20  Roxan Hockey, MD  atorvastatin (LIPITOR) 40 MG tablet Take 1 tablet (40 mg total) by mouth daily. 08/30/20 09/29/20  Roxan Hockey, MD  Cholecalciferol (VITAMIN D3) 2000 units capsule TAKE 1 CAPSULE BY MOUTH ONCE DAILY. **DO NOT CRUSH** Patient taking differently: Take 2,000 Units by mouth daily. 01/06/18   Fayrene Helper, MD  donepezil (ARICEPT) 10 MG tablet TAKE (1) TABLET BY MOUTH ONCE DAILY. Patient taking differently: Take 10 mg by mouth daily. 06/15/17   Raylene Everts, MD  dronabinol (MARINOL) 2.5 MG capsule Take 2.5 mg by mouth 2 (two) times daily before lunch and supper.    [provider]  enoxaparin (LOVENOX) 60 MG/0.6ML injection Inject 0.6 mLs (60 mg total) into the skin every 12 (twelve) hours. Last Dose in am of Thursday 09/13/20, then Restart in the evening of Friday 09/14/20 after Gi Procedure if ok with Gi Physician-Dr. Mansouraty 09/12/20   Roxan Hockey, MD  feeding supplement (ENSURE ENLIVE / ENSURE PLUS) LIQD Take 237 mLs by mouth 2 (two) times daily between meals. 06/22/20   Manuella Ghazi, Pratik D, DO  FEROSUL 325 (65 Fe) MG tablet TAKE 1 TABLET BY MOUTH TWICE DAILY. Patient taking differently: Take 325 mg by mouth in the morning and at  bedtime. 06/15/17   Raylene Everts, MD  folic acid (FOLVITE) 546 MCG tablet Take 800 mcg by mouth daily.    [provider]  insulin aspart (NOVOLOG) 100 UNIT/ML FlexPen Inject 0-10 Units into the skin 3 (three) times daily with meals. insulin aspart (novoLOG) injection 0-10 Units 0-10 Units Subcutaneous, 3 times daily with meals CBG < 70: Implement Hypoglycemia Standing Orders and refer to Hypoglycemia Standing Orders sidebar report  CBG 70 - 120: 0 unit CBG 121 - 150: 0 unit  CBG 151 - 200: 1 unit CBG 201 - 250: 2 units CBG 251 - 300: 4 units CBG 301 - 350: 6 units  CBG 351 - 400: 8  units  CBG > 400: 10 units 08/30/20   Emokpae, Courage, MD  Lactulose 20 GM/30ML SOLN Take 30 mLs (20 g total) by mouth See admin instructions. --Please give lactulose 20 g p.o. x1 if no bowel movement in 48 hours 09/12/20   Roxan Hockey, MD  magnesium oxide (MAG-OX) 400 MG tablet Take 2 tablets (800 mg total) by mouth 3 (three) times daily. 08/30/20   Roxan Hockey, MD  memantine (NAMENDA) 10 MG tablet Take 10 mg by mouth 2 (two) times daily. 06/07/20   [provider]  metFORMIN (GLUCOPHAGE) 1000 MG tablet Take 1 tablet (1,000 mg total) by mouth 2 (two) times daily with a meal. 06/08/20   Barton Dubois, MD  metoprolol tartrate (LOPRESSOR) 25 MG tablet Take 1 tablet (25 mg total) by mouth 2 (two) times daily. 08/30/20 09/29/20  Roxan Hockey, MD  Na Sulfate-K Sulfate-Mg Sulf (SUPREP BOWEL PREP KIT) 17.5-3.13-1.6 GM/177ML SOLN Take 1 kit by mouth as directed. For colonoscopy prep 09/14/20   Mansouraty, Telford Nab., MD  ondansetron (ZOFRAN) 4 MG tablet Take 1 tablet (4 mg total) by mouth every 6 (six) hours as needed for nausea. 06/22/20   Manuella Ghazi, Pratik D, DO  oxybutynin (DITROPAN-XL) 5 MG 24 hr tablet Take 5 mg by mouth daily. 01/04/20   [provider]  OXYGEN Inhale 2 L into the lungs daily.    [provider]  pantoprazole (PROTONIX) 40 MG tablet Take 1 tablet (40 mg total) by mouth  daily. 08/30/20 11/28/20  Roxan Hockey, MD  polyethylene glycol (MIRALAX) 17 g packet Take 17 g by mouth 2 (two) times daily. For Constipation 09/12/20   Roxan Hockey, MD  senna-docusate (SENOKOT-S) 8.6-50 MG tablet Take 2 tablets by mouth at bedtime. 09/12/20 09/12/21  Roxan Hockey, MD  sucralfate (CARAFATE) 1 g tablet Take 1 tablet (1 g total) by mouth 4 (four) times daily. 06/22/20 06/22/21  Manuella Ghazi, Pratik D, DO  tamsulosin (FLOMAX) 0.4 MG CAPS capsule TAKE 1 CAPSULE BY MOUTH AT BEDTIME. Patient taking differently: Take 0.4 mg by mouth at bedtime. 06/15/17   Raylene Everts, MD  traZODone (DESYREL) 100 MG tablet Take 100 mg by mouth at bedtime. 01/04/20   [provider]  vitamin B-12 (CYANOCOBALAMIN) 500 MCG tablet Take 1,000 mcg by mouth daily.    [provider]  ramipril (ALTACE) 10 MG capsule Take 2 capsules (20 mg total) by mouth daily. 06/08/20 06/16/20  Barton Dubois, MD    Allergies    Patient has no known allergies.  Review of Systems   Review of Systems  Unable to perform ROS: Other  patient poor historian  Physical Exam Updated Vital Signs BP 102/77   Pulse (!) 135   Temp 98.4 F (36.9 C) (Oral)   Resp 19   Ht 5' 8"  (1.727 m)   Wt 66.5 kg   SpO2 95%   BMI 22.29 kg/m   Physical Exam Vitals and nursing note reviewed.  Constitutional:      General: He is not in acute distress.    Appearance: He is well-developed. He is not diaphoretic.  HENT:     Head: Normocephalic and atraumatic.  Eyes:     General: No scleral icterus.    Conjunctiva/sclera: Conjunctivae normal.  Cardiovascular:     Rate and Rhythm: Normal rate and regular rhythm.     Heart sounds: Normal heart sounds.  Pulmonary:     Effort: Pulmonary effort is normal. No respiratory distress.     Breath sounds:  Normal breath sounds.  Abdominal:     Palpations: Abdomen is soft.     Tenderness: There is no abdominal tenderness.  Musculoskeletal:     Cervical back: Normal range of  motion and neck supple.  Skin:    General: Skin is warm and dry.  Neurological:     Mental Status: He is alert.  Psychiatric:        Behavior: Behavior normal.     ED Results / Procedures / Treatments   Labs (all labs ordered are listed, but only abnormal results are displayed) Labs Reviewed  COMPREHENSIVE METABOLIC PANEL  CBC WITH DIFFERENTIAL/PLATELET  PROTIME-INR  AMMONIA  LIPASE, BLOOD  LACTIC ACID, PLASMA  TYPE AND SCREEN    EKG None  Radiology No results found.  Procedures Procedures   Medications Ordered in ED Medications  pantoprazole (PROTONIX) 80 mg in sodium chloride 0.9 % 100 mL IVPB (has no administration in time range)  pantoprazole (PROTONIX) 80 mg in sodium chloride 0.9 % 100 mL (0.8 mg/mL) infusion (has no administration in time range)  ondansetron (ZOFRAN) injection 4 mg (has no administration in time range)    ED Course  I have reviewed the triage vital signs and the nursing notes.  Pertinent labs & imaging results that were available during my care of the patient were reviewed by me and considered in my medical decision making (see chart for details).  Clinical Course as of 09/15/20 1915  Sat Sep 15, 2020  1913 70 yo male w/ hx of GI bleeds, esophagitis, ulcers, EGD yesterday with polyp biopsy, presenting with dark coffee ground emesis.   Pt has partial SBO on prior hospitalization.  Here tachycardic, HR 135, afebrile, abdominal discomfort and mild tenderness.  Pending workup for GI bleed vs recurring obstruction [MT]    Clinical Course User Index [MT] Wyvonnia Dusky, MD   MDM Rules/Calculators/A&P                         70 year old male here recently discharged after DVT, ileus.  Had an EGD yesterday.  He has had no active vomiting here in the emergency department.  Patient has been persistently tachycardic with soft but stable pressures throughout his ED course.  Ordered and reviewed labs which shows CBC with slightly elevated white blood  cell count, hemoglobin is improved from previous admission.  Lipase lactic acid PT/INR and ammonia within normal limits.  CMP shows mildly elevated glucose of insignificant value.  Patient is hypercalcemic.  I ordered and reviewed a CT abdomen and pelvis which shows persistent ileus unchanged from previous admission.  Portable 1 view chest x-ray is without significant abnormality.  Case discussed with Dr. Constance Haw who feels the patient would do best with medical management.  Patient also discussed with Dr. Josephine Cables who will admit the patient for ileus. Final Clinical Impression(s) / ED Diagnoses Final diagnoses:  Vomiting    Rx / DC Orders ED Discharge Orders    None       Margarita Mail, PA-C 09/15/20 2310    Wyvonnia Dusky, MD 09/16/20 [phone]

## 2020-09-15 NOTE — ED Notes (Signed)
Patient transported to CT 

## 2020-09-15 NOTE — ED Triage Notes (Signed)
Pt arrived from [facility] via [facility] for projectile vomiting, coffee ground brown emesis.

## 2020-09-15 NOTE — H&P (Signed)
History and Physical  Daniel [name] JJK:[id number] DOB: 1951-06-08 DOA: 09/15/2020  Referring physician: Margarita Mail, PA-C PCP: Leonie Douglas, MD  Patient coming from: [facility]  Chief Complaint: Vomiting  HPI: Daniel [name] is a 70 y.o. male with medical history significant for hypertension, T2DM, dementia, esophagitis, gastric ulcers, DVT and duodenal mass who presents to the emergency department via EMS due to vomiting.  Patient was unable to provide history, this was obtained from ED physician and ED medical record.  Per report, patient was sent from the nursing facility due to projectile vomiting described as coffee-ground emesis x2.  Of note, patient was started on Lovenox due to DVT, this was discontinued on discharge 4/6 prior to having an EGD with Dr. Rush Landmark yesterday (4/8) where he had a biopsy of an adenomatous duodenal polyp. Recent hospitalization:  4/1-4/6; due to partial bowel obstruction and duodenal mass (adenomatous duodenal polyp).  1/11-1/18; due to abdominal pain, EGD showed severe esophagitis, 4 gastric ulcers with erosions, largest ulcer 2.5 cm, biopsies were negative for H. pylori or dysplasia.   ED Course:  In the emergency department, he was tachycardic, but other vital signs were within normal range.  Work-up in the ED showed leukocytosis, normocytic anemia, hyperglycemia, hypoalbuminemia, lactic acid, ammonia and lipase levels were within normal range. CT abdomen and pelvis with contrast showed no acute abnormality, improving distal esophagitis, stable dilated stomach and colon, compatible with ileus.  No findings to indicate interval gastric outlet obstruction and mildly improved bilateral lung opacities, compatible with improving pneumonia. ED PA states that she spoke with general surgery who states the patient can be managed medically at this time and that there is no indication for NG tube since patient was not actively vomiting. IV hydration, Protonix,  Reglan and Zofran were given.  Hospitalist was asked to admit patient for further evaluation and management.  Review of Systems: This cannot be obtained at this time due to patient's possible underlying dementia  Past Medical History:  Diagnosis Date  . Acute kidney failure ([place])   . Acute respiratory failure ([place])   . Adenomatous polyp of stomach   . Anemia   . Benign prostate hyperplasia   . Dementia ([place])   . Depression   . Diabetes mellitus without complication ([place])   . DVT (deep venous thrombosis) ([place])   . Gastric ulcer   . GERD (gastroesophageal reflux disease)   . High cholesterol   . Hypertension   . Hypo-osmolality and hyponatremia   . Insomnia   . Neuromuscular dysfunction of bladder   . Osteoarthritis   . Partial intestinal obstruction ([place])   . Pneumonitis   . Protein-calorie malnutrition, moderate ([place])   . Stroke [facility])    Past Surgical History:  Procedure Laterality Date  . BIOPSY  06/21/2020   Procedure: BIOPSY;  Surgeon: Daneil Dolin, MD;  Location: [facility] END[facility];  Service: Endoscopy;;  gastric  . ESOPHAGOGASTRODUODENOSCOPY (EGD) WITH PROPOFOL N/A 06/21/2020   Procedure: ESOPHAGOGASTRODUODENOSCOPY (EGD) WITH PROPOFOL;  Surgeon: Daneil Dolin, MD;  Location: [facility];  Service: Endoscopy;  Laterality: N/A;  . HIP SURGERY Left   . KIDNEY SURGERY     kidney removed    Social History:  reports that he has never smoked. He has never used smokeless tobacco. He reports that he does not drink alcohol and does not use drugs.   No Known Allergies  Family History  Problem Relation Age of Onset  . Hypertension Sister   . Hyperlipidemia  Sister   . Osteoarthritis Sister   . Rheum arthritis Sister   . Breast cancer Niece   . Colon cancer Neg Hx   . Esophageal cancer Neg Hx   . Inflammatory bowel disease Neg Hx   . Liver disease Neg Hx   . Pancreatic cancer Neg Hx   . Stomach cancer Neg Hx   . Rectal cancer Neg Hx     Prior to Admission  medications   Medication Sig Start Date End Date Taking? Authorizing Provider  acetaminophen (TYLENOL) 325 MG tablet Take 2 tablets (650 mg total) by mouth every 6 (six) hours as needed for mild pain, fever or headache (or Fever >/= 101). 08/30/20   Emokpae, Courage, MD  aMILoride (MIDAMOR) 5 MG tablet Take 1 tablet (5 mg total) by mouth daily. 08/31/20   Roxan Hockey, MD  amitriptyline (ELAVIL) 10 MG tablet Take 10 mg by mouth at bedtime. 04/30/20   [provider]  amoxicillin-clavulanate (AUGMENTIN) 875-125 MG tablet Take 1 tablet by mouth 2 (two) times daily for 3 days. 09/12/20 09/15/20  Roxan Hockey, MD  atorvastatin (LIPITOR) 40 MG tablet Take 1 tablet (40 mg total) by mouth daily. 08/30/20 09/29/20  Roxan Hockey, MD  Cholecalciferol (VITAMIN D3) 2000 units capsule TAKE 1 CAPSULE BY MOUTH ONCE DAILY. **DO NOT CRUSH** Patient taking differently: Take 2,000 Units by mouth daily. 01/06/18   Fayrene Helper, MD  donepezil (ARICEPT) 10 MG tablet TAKE (1) TABLET BY MOUTH ONCE DAILY. Patient taking differently: Take 10 mg by mouth daily. 06/15/17   Raylene Everts, MD  dronabinol (MARINOL) 2.5 MG capsule Take 2.5 mg by mouth 2 (two) times daily before lunch and supper.    [provider]  enoxaparin (LOVENOX) 60 MG/0.6ML injection Inject 0.6 mLs (60 mg total) into the skin every 12 (twelve) hours. Last Dose in am of Thursday 09/13/20, then Restart in the evening of Friday 09/14/20 after Gi Procedure if ok with Gi Physician-Dr. Mansouraty 09/12/20   Roxan Hockey, MD  feeding supplement (ENSURE ENLIVE / ENSURE PLUS) LIQD Take 237 mLs by mouth 2 (two) times daily between meals. 06/22/20   Manuella Ghazi, Pratik D, DO  FEROSUL 325 (65 Fe) MG tablet TAKE 1 TABLET BY MOUTH TWICE DAILY. Patient taking differently: Take 325 mg by mouth in the morning and at bedtime. 06/15/17   Raylene Everts, MD  folic acid (FOLVITE) 381 MCG tablet Take 800 mcg by mouth daily.    [provider]   insulin aspart (NOVOLOG) 100 UNIT/ML FlexPen Inject 0-10 Units into the skin 3 (three) times daily with meals. insulin aspart (novoLOG) injection 0-10 Units 0-10 Units Subcutaneous, 3 times daily with meals CBG < 70: Implement Hypoglycemia Standing Orders and refer to Hypoglycemia Standing Orders sidebar report  CBG 70 - 120: 0 unit CBG 121 - 150: 0 unit  CBG 151 - 200: 1 unit CBG 201 - 250: 2 units CBG 251 - 300: 4 units CBG 301 - 350: 6 units  CBG 351 - 400: 8 units  CBG > 400: 10 units 08/30/20   Emokpae, Courage, MD  Lactulose 20 GM/30ML SOLN Take 30 mLs (20 g total) by mouth See admin instructions. --Please give lactulose 20 g p.o. x1 if no bowel movement in 48 hours 09/12/20   Roxan Hockey, MD  magnesium oxide (MAG-OX) 400 MG tablet Take 2 tablets (800 mg total) by mouth 3 (three) times daily. 08/30/20   Roxan Hockey, MD  memantine (NAMENDA) 10 MG tablet Take  10 mg by mouth 2 (two) times daily. 06/07/20   [provider]  metFORMIN (GLUCOPHAGE) 1000 MG tablet Take 1 tablet (1,000 mg total) by mouth 2 (two) times daily with a meal. 06/08/20   Barton Dubois, MD  metoprolol tartrate (LOPRESSOR) 25 MG tablet Take 1 tablet (25 mg total) by mouth 2 (two) times daily. 08/30/20 09/29/20  Roxan Hockey, MD  Na Sulfate-K Sulfate-Mg Sulf (SUPREP BOWEL PREP KIT) 17.5-3.13-1.6 GM/177ML SOLN Take 1 kit by mouth as directed. For colonoscopy prep 09/14/20   Mansouraty, Telford Nab., MD  ondansetron (ZOFRAN) 4 MG tablet Take 1 tablet (4 mg total) by mouth every 6 (six) hours as needed for nausea. 06/22/20   Manuella Ghazi, Pratik D, DO  oxybutynin (DITROPAN-XL) 5 MG 24 hr tablet Take 5 mg by mouth daily. 01/04/20   [provider]  OXYGEN Inhale 2 L into the lungs daily.    [provider]  pantoprazole (PROTONIX) 40 MG tablet Take 1 tablet (40 mg total) by mouth daily. 08/30/20 11/28/20  Roxan Hockey, MD  polyethylene glycol (MIRALAX) 17 g packet Take 17 g by mouth 2 (two) times daily. For  Constipation 09/12/20   Roxan Hockey, MD  senna-docusate (SENOKOT-S) 8.6-50 MG tablet Take 2 tablets by mouth at bedtime. 09/12/20 09/12/21  Roxan Hockey, MD  sucralfate (CARAFATE) 1 g tablet Take 1 tablet (1 g total) by mouth 4 (four) times daily. 06/22/20 06/22/21  Manuella Ghazi, Pratik D, DO  tamsulosin (FLOMAX) 0.4 MG CAPS capsule TAKE 1 CAPSULE BY MOUTH AT BEDTIME. Patient taking differently: Take 0.4 mg by mouth at bedtime. 06/15/17   Raylene Everts, MD  traZODone (DESYREL) 100 MG tablet Take 100 mg by mouth at bedtime. 01/04/20   [provider]  vitamin B-12 (CYANOCOBALAMIN) 500 MCG tablet Take 1,000 mcg by mouth daily.    [provider]  ramipril (ALTACE) 10 MG capsule Take 2 capsules (20 mg total) by mouth daily. 06/08/20 06/16/20  Barton Dubois, MD    Physical Exam: BP 96/70   Pulse (!) 112   Temp 98.4 F (36.9 C) (Oral)   Resp 15   Ht 5' 8"  (1.727 m)   Wt 66.5 kg   SpO2 92%   BMI 22.29 kg/m   . General: 70 y.o. year-old male well developed and in no acute distress.   Marland Kitchen HEENT: NCAT, EOMI . Neck: Supple, trachea medial . Cardiovascular: Regular rate and rhythm with no rubs or gallops.  No thyromegaly or JVD noted.  No lower extremity edema. 2/4 pulses in all 4 extremities. Marland Kitchen Respiratory: Clear to auscultation with no wheezes or rales. Good inspiratory effort. . Abdomen: Soft nontender nondistended with normal bowel sounds x4 quadrants. . Muskuloskeletal: No cyanosis, clubbing or edema noted bilaterally . Neuro: Alert, moves all extremities, follow commands, sensation intact. . Skin: No ulcerative lesions noted or rashes . Psychiatry: Mood is appropriate for condition and setting          Labs on Admission:  Basic Metabolic Panel: Recent Labs  Lab 09/09/20 0712 09/10/20 0621 09/11/20 0425 09/12/20 0529 09/15/20 1850  NA 142 145 140 135 135  K 2.7* 3.1* 3.3* 3.5 4.8  CL 97* 100 100 96* 92*  CO2 34* 27 31 27 29   GLUCOSE 94 120* 171* 173* 221*  BUN 18  22 17 9  25*  CREATININE 0.95 1.21 1.00 0.82 1.16  CALCIUM 10.8* 10.8* 9.5 9.3 11.6*  MG 1.3* 1.6* 1.5* 1.7  --    Liver Function Tests: Recent  Labs  Lab 09/09/20 0712 09/10/20 0621 09/11/20 0425 09/12/20 0529 09/15/20 1850  AST 13* 10* 9* 18 24  ALT 29 21 17 24  34  ALKPHOS 89 84 74 94 102  BILITOT 0.6 1.3* 0.6 0.4 0.5  PROT 5.9* 5.8* 5.4* 6.3* 7.1  ALBUMIN 2.5* 2.5* 2.4* 2.8* 3.3*   Recent Labs  Lab 09/15/20 1850  LIPASE 34   Recent Labs  Lab 09/15/20 1919  AMMONIA 16   CBC: Recent Labs  Lab 09/09/20 0712 09/10/20 0621 09/11/20 0425 09/15/20 1850  WBC 8.4 8.2 5.1 13.3*  NEUTROABS  --   --   --  11.9*  HGB 9.4* 8.7* 8.3* 11.7*  HCT 29.7* 28.2* 25.7* 36.6*  MCV 85.3 87.6 84.8 84.9  PLT 362 371 329 399   Cardiac Enzymes: No results for input(s): CKTOTAL, CKMB, CKMBINDEX, TROPONINI in the last 168 hours.  BNP (last 3 results) No results for input(s): BNP in the last 8760 hours.  ProBNP (last 3 results) No results for input(s): PROBNP in the last 8760 hours.  CBG: Recent Labs  Lab 09/11/20 1654 09/12/20 0024 09/12/20 0748 09/12/20 1111 09/12/20 1627  GLUCAP 134* 172* 183* 207* 178*    Radiological Exams on Admission: CT ABDOMEN PELVIS W CONTRAST  Result Date: 09/15/2020 CLINICAL DATA:  Gastrointestinal bleeding. Projectile vomiting with coffee-ground brown emesis. EXAM: CT ABDOMEN AND PELVIS WITH CONTRAST TECHNIQUE: Multidetector CT imaging of the abdomen and pelvis was performed using the standard protocol following bolus administration of intravenous contrast. CONTRAST:  182m OMNIPAQUE 300 COMPARISON:  09/07/2020 FINDINGS: Lower chest: Stable 5.6 x 2.4 cm oval area of fluid density adjacent to the posterolateral aspect of the heart on the left on image number 14/6. Diffuse low density distal esophageal wall thickening with improvement. Normal sized heart. Atheromatous coronary artery calcifications. Patchy densities in the right lower lobe with some  consolidation with mild improvement. Patchy densities in the posterior right upper lobe with mild improvement. Small amount of interval dependent patchy density in the left lower lobe. No pleural fluid. Hepatobiliary: No focal liver abnormality is seen. No gallstones, gallbladder wall thickening, or biliary dilatation. Pancreas: Stable normal appearing pancreatic head and proximal neck. 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 Spleen: Normal in size without focal abnormality. Adrenals/Urinary Tract: Normal appearing adrenal glands. Stable right renal cysts and small parenchymal calcification. Stable possible small, severely atrophied left kidney. Unremarkable urinary bladder and right ureter. Stomach/Bowel: Stable dilated stomach and colon. Normal caliber small bowel. No evidence of appendicitis. Vascular/Lymphatic: No significant vascular findings are present. No enlarged abdominal or pelvic lymph nodes. Reproductive: Prostate is unremarkable. Other: Small umbilical hernia containing fat. Musculoskeletal: Stable left hip prosthesis. Lumbar and lower thoracic spine degenerative changes. Stable bilateral L5 pars defects with grade 2 anterolisthesis and vertebral body fusion at the L5-S1 level. IMPRESSION: 1. No acute abnormality. 2. Improving distal esophagitis. 3. Stable dilated stomach and colon, compatible with ileus. No findings to indicate interval gastric outlet obstruction. 4. Mildly improved bilateral lung opacities, compatible with improving pneumonia. 5. Stable 5.6 x 2.4 cm oval area of fluid density adjacent to the posterolateral aspect of the heart on the left, compatible with a pericardial cyst. 6. 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 7. Stable possible small, severely atrophied left kidney. Electronically Signed   By: SClaudie ReveringM.D.   On: 09/15/2020 21:58   DG Chest Port 1 View  Result Date:  09/15/2020 CLINICAL DATA:  Coffee-ground emesis EXAM: PORTABLE CHEST 1 VIEW COMPARISON:  07/27/2020 FINDINGS: The heart size and mediastinal contours are within normal limits. Both lungs are clear. Unchanged elevation of the bilateral hemidiaphragms with associated scarring or atelectasis. The visualized skeletal structures are unremarkable. IMPRESSION: No acute abnormality of the lungs in AP portable projection. Electronically Signed   By: Eddie Candle M.D.   On: 09/15/2020 19:14    EKG: I independently viewed the EKG done and my findings are as followed: EKG was not done in the ED  Assessment/Plan Present on Admission: . Ileus ([place]) . Esophagitis . Duodenal mass . Multiple gastric ulcers . Hypertension . Dementia ([place]) . BPH (benign prostatic hyperplasia)  Principal Problem:   Ileus ([place]) Active Problems:   Hypertension   BPH (benign prostatic hyperplasia)   Dementia ([facility])   Hyperglycemia due to diabetes mellitus ([facility])   Normocytic anemia   Multiple gastric ulcers   Esophagitis   Duodenal mass   DVT (deep venous thrombosis) ([facility])   Vomiting   Leukocytosis   Hypoalbuminemia due to protein-calorie malnutrition ([facility])  Ileus CT abdomen and pelvis showed  stable dilated stomach and colon, compatible with ileus.  No findings to indicate interval gastric outlet obstruction No indication for NG tube at this time since patient is not vomiting Continue NPO at this time with plan to advance diet as tolerated Continue IV hydration Continue IV Zofran p.r.n. for nausea/vomiting No indication for any surgical intervention and that patient should be managed medically per ED PA consult to surgery  Reported vomiting (?  Hematemesis) Patient was reported to have projectile vomiting of coffee-ground emesis at the nursing facility, he has not vomited since arrival to the ED Continue IV Zofran as needed Continue IV Protonix Gastroenterology will be consulted and we shall await further  recommendation  Distal esophagitis, history of gastric ulcer January 2022 EGD showed: Moderately severe reflux esophagitis. Multiple gastric ulcers without bleeding stigmata and associated gastric erosions. Patent pylorus. Status post gastric mucosal biopsy Duodenal mass. Continue IV Protonix  Duodenal mass Patient followed with Dr. Rush Landmark on 4/8, and EGD was done with endoscopic mucosal resection  Hyperglycemia secondary to T2DM Continue ISS and hypoglycemic protocol  History of DVT Continue Lovenox with plan to hold this for any recurrent hematemesis/bleed Leukocytosis possibly reactive WBC 13.3; continue to monitor WBC with morning labs  Hypoalbuminemia possibly secondary to mild protein calorie malnutrition Protein supplement will be provided when patient resumes oral intake  Normocytic anemia Stable, continue to monitor CBC with morning labs  Essential hypertension BP meds will be held at this time due to soft BP  BPH Flomax will be resumed when patient resumes oral intake  Dementia Aricept and Namenda will be resumed when patient resumes oral intake  Hyperlipidemia Continue Lipitor when patient resumes oral intake  Pericardial cyst CT abdomen and pelvis with contrast showed stable 5.6 x 2.4 cm oval area of fluid density adjacent to the posterolateral aspect of the heart on the left, compatible with a pericardial cyst.  DVT prophylaxis: Lovenox  Code Status: Full code  Family Communication: None at bedside  Disposition Plan:  Patient is from:                        home Anticipated DC to:                   SNF or family members home Anticipated DC date:  2-3 days Anticipated DC barriers:           Patient requires inpatient management due to ileus and GI consult to rule out reported hematemesis   Consults called: Gastroenterology  Admission status: Changed    Bernadette Hoit MD [facility] Pager [phone]  If 7PM-7AM,  please contact night-coverage [email or web address] Password [facility]  09/16/2020, 12:47 AM

## 2020-09-16 DIAGNOSIS — K920 Hematemesis: Secondary | ICD-10-CM

## 2020-09-16 DIAGNOSIS — K3189 Other diseases of stomach and duodenum: Secondary | ICD-10-CM

## 2020-09-16 DIAGNOSIS — R1110 Vomiting, unspecified: Secondary | ICD-10-CM

## 2020-09-16 DIAGNOSIS — D72829 Elevated white blood cell count, unspecified: Secondary | ICD-10-CM

## 2020-09-16 DIAGNOSIS — E8809 Other disorders of plasma-protein metabolism, not elsewhere classified: Secondary | ICD-10-CM

## 2020-09-16 DIAGNOSIS — Q248 Other specified congenital malformations of heart: Secondary | ICD-10-CM

## 2020-09-16 DIAGNOSIS — E46 Unspecified protein-calorie malnutrition: Secondary | ICD-10-CM

## 2020-09-16 DIAGNOSIS — R970 Elevated carcinoembryonic antigen [CEA]: Secondary | ICD-10-CM

## 2020-09-16 DIAGNOSIS — E785 Hyperlipidemia, unspecified: Secondary | ICD-10-CM

## 2020-09-16 DIAGNOSIS — I82409 Acute embolism and thrombosis of unspecified deep veins of unspecified lower extremity: Secondary | ICD-10-CM

## 2020-09-16 LAB — CBC
HCT: 29.7 % — ABNORMAL LOW (ref 39.0–52.0)
Hemoglobin: 9.6 g/dL — ABNORMAL LOW (ref 13.0–17.0)
MCH: 27.7 pg (ref 26.0–34.0)
MCHC: 32.3 g/dL (ref 30.0–36.0)
MCV: 85.8 fL (ref 80.0–100.0)
Platelets: 328 10*3/uL (ref 150–400)
RBC: 3.46 MIL/uL — ABNORMAL LOW (ref 4.22–5.81)
RDW: 17 % — ABNORMAL HIGH (ref 11.5–15.5)
WBC: 12.2 10*3/uL — ABNORMAL HIGH (ref 4.0–10.5)
nRBC: 0 % (ref 0.0–0.2)

## 2020-09-16 LAB — CBG MONITORING, ED
Glucose-Capillary: 157 mg/dL — ABNORMAL HIGH (ref 70–99)
Glucose-Capillary: 100 mg/dL — ABNORMAL HIGH (ref 70–99)
Glucose-Capillary: 129 mg/dL — ABNORMAL HIGH (ref 70–99)
Glucose-Capillary: 111 mg/dL — ABNORMAL HIGH (ref 70–99)

## 2020-09-16 LAB — SARS CORONAVIRUS 2 (TAT 6-24 HRS): SARS Coronavirus 2: NEGATIVE

## 2020-09-16 LAB — COMPREHENSIVE METABOLIC PANEL
ALT: 25 U/L (ref 0–44)
AST: 15 U/L (ref 15–41)
Albumin: 2.7 g/dL — ABNORMAL LOW (ref 3.5–5.0)
Alkaline Phosphatase: 82 U/L (ref 38–126)
Anion gap: 10 (ref 5–15)
BUN: 24 mg/dL — ABNORMAL HIGH (ref 8–23)
CO2: 26 mmol/L (ref 22–32)
Calcium: 10.3 mg/dL (ref 8.9–10.3)
Chloride: 99 mmol/L (ref 98–111)
Creatinine, Ser: 1.16 mg/dL (ref 0.61–1.24)
GFR, Estimated: >60 mL/min (ref >60)
Glucose, Bld: 122 mg/dL — ABNORMAL HIGH (ref 70–99)
Potassium: 4.1 mmol/L (ref 3.5–5.1)
Sodium: 135 mmol/L (ref 135–145)
Total Bilirubin: 0.7 mg/dL (ref 0.3–1.2)
Total Protein: 5.8 g/dL — ABNORMAL LOW (ref 6.5–8.1)

## 2020-09-16 LAB — MAGNESIUM: Magnesium: 1.6 mg/dL — ABNORMAL LOW (ref 1.7–2.4)

## 2020-09-16 LAB — PROTIME-INR
INR: 1.2 (ref 0.8–1.2)
Prothrombin Time: 15.1 seconds (ref 11.4–15.2)

## 2020-09-16 LAB — APTT: aPTT: 32 seconds (ref 24–36)

## 2020-09-16 LAB — GLUCOSE, CAPILLARY
Glucose-Capillary: 103 mg/dL — ABNORMAL HIGH (ref 70–99)
Glucose-Capillary: 185 mg/dL — ABNORMAL HIGH (ref 70–99)

## 2020-09-16 LAB — PHOSPHORUS: Phosphorus: 1.4 mg/dL — ABNORMAL LOW (ref 2.5–4.6)

## 2020-09-16 MED ORDER — SODIUM CHLORIDE 0.9 % IV SOLN: Freq: Once | INTRAVENOUS | Status: AC

## 2020-09-16 MED ORDER — AMILORIDE HCL 5 MG PO TABS
5.0000 mg | ORAL_TABLET | Freq: Every day | ORAL | Status: DC
  Administered 2020-09-16 – 2020-09-19 (×4): 5 mg via ORAL
  Filled 2020-09-16 (×6): qty 1

## 2020-09-16 MED ORDER — DONEPEZIL HCL 5 MG PO TABS
5.0000 mg | ORAL_TABLET | Freq: Every day | ORAL | Status: DC
  Administered 2020-09-16 – 2020-09-19 (×4): 5 mg via ORAL
  Filled 2020-09-16 (×4): qty 1

## 2020-09-16 MED ORDER — INSULIN ASPART 100 UNIT/ML ~~LOC~~ SOLN
0.0000 [IU] | SUBCUTANEOUS | Status: DC
  Administered 2020-09-16: 1 [IU] via SUBCUTANEOUS
  Administered 2020-09-16: 6 [IU] via SUBCUTANEOUS
  Administered 2020-09-16: 2 [IU] via SUBCUTANEOUS
  Administered 2020-09-17: 1 [IU] via SUBCUTANEOUS
  Administered 2020-09-17: 2 [IU] via SUBCUTANEOUS
  Administered 2020-09-17 – 2020-09-18 (×6): 1 [IU] via SUBCUTANEOUS
  Filled 2020-09-16 (×2): qty 1

## 2020-09-16 MED ORDER — SODIUM CHLORIDE 0.9 % IV SOLN: INTRAVENOUS | Status: DC

## 2020-09-16 MED ORDER — METOPROLOL TARTRATE 25 MG PO TABS
25.0000 mg | ORAL_TABLET | Freq: Two times a day (BID) | ORAL | Status: DC
  Administered 2020-09-16 – 2020-09-19 (×7): 25 mg via ORAL
  Filled 2020-09-16 (×7): qty 1

## 2020-09-16 MED ORDER — MEMANTINE HCL 10 MG PO TABS
5.0000 mg | ORAL_TABLET | Freq: Every day | ORAL | Status: DC
  Administered 2020-09-16 – 2020-09-19 (×4): 5 mg via ORAL
  Filled 2020-09-16 (×4): qty 1

## 2020-09-16 MED ORDER — MAGNESIUM SULFATE 2 GM/50ML IV SOLN
2.0000 g | Freq: Once | INTRAVENOUS | Status: AC
  Administered 2020-09-16: 2 g via INTRAVENOUS
  Filled 2020-09-16: qty 50

## 2020-09-16 MED ORDER — MORPHINE SULFATE (PF) 2 MG/ML IV SOLN: 2.0000 mg | INTRAVENOUS | Status: DC | PRN

## 2020-09-16 MED ORDER — POLYETHYLENE GLYCOL 3350 17 G PO PACK
17.0000 g | PACK | Freq: Two times a day (BID) | ORAL | Status: DC
  Administered 2020-09-16 – 2020-09-19 (×6): 17 g via ORAL
  Filled 2020-09-16 (×6): qty 1

## 2020-09-16 MED ORDER — ENOXAPARIN SODIUM 60 MG/0.6ML ~~LOC~~ SOLN
60.0000 mg | Freq: Two times a day (BID) | SUBCUTANEOUS | Status: DC
  Administered 2020-09-16: 60 mg via SUBCUTANEOUS
  Filled 2020-09-16 (×3): qty 0.6

## 2020-09-16 MED ORDER — METOCLOPRAMIDE HCL 5 MG/ML IJ SOLN
5.0000 mg | Freq: Three times a day (TID) | INTRAMUSCULAR | Status: DC
  Administered 2020-09-17 – 2020-09-19 (×7): 5 mg via INTRAVENOUS
  Filled 2020-09-16 (×6): qty 2

## 2020-09-16 MED ORDER — SENNOSIDES-DOCUSATE SODIUM 8.6-50 MG PO TABS
2.0000 | ORAL_TABLET | Freq: Every day | ORAL | Status: DC
  Administered 2020-09-16 – 2020-09-19 (×4): 2 via ORAL
  Filled 2020-09-16 (×4): qty 2

## 2020-09-16 MED ORDER — K PHOS MONO-SOD PHOS DI & MONO 155-852-130 MG PO TABS
250.0000 mg | ORAL_TABLET | Freq: Three times a day (TID) | ORAL | Status: DC
  Administered 2020-09-16 – 2020-09-19 (×12): 250 mg via ORAL
  Filled 2020-09-16 (×13): qty 1

## 2020-09-16 MED ORDER — AMITRIPTYLINE HCL 10 MG PO TABS
10.0000 mg | ORAL_TABLET | Freq: Every day | ORAL | Status: DC
  Administered 2020-09-16 – 2020-09-19 (×4): 10 mg via ORAL
  Filled 2020-09-16 (×4): qty 1

## 2020-09-16 MED ORDER — ATORVASTATIN CALCIUM 40 MG PO TABS
40.0000 mg | ORAL_TABLET | Freq: Every evening | ORAL | Status: DC
  Administered 2020-09-16 – 2020-09-19 (×4): 40 mg via ORAL
  Filled 2020-09-16 (×4): qty 1

## 2020-09-16 MED ORDER — ONDANSETRON HCL 4 MG/2ML IJ SOLN: 4.0000 mg | Freq: Four times a day (QID) | INTRAMUSCULAR | Status: DC | PRN

## 2020-09-16 NOTE — Progress Notes (Signed)
ANTICOAGULATION CONSULT NOTE - Initial Consult  Pharmacy Consult for Lovenox Indication: DVT  No Known Allergies  Patient Measurements: Height: 5' 8"  (172.7 cm) Weight: 66.5 kg (146 lb 9.6 oz) IBW/kg (Calculated) : 68.4  Vital Signs: Temp: 98.4 F (36.9 C) (04/09 1825) Temp Source: Oral (04/09 1825) BP: 96/70 (04/10 0000) Pulse Rate: 112 (04/10 0000)  Labs: Recent Labs    09/15/20 1850  HGB 11.7*  HCT 36.6*  PLT 399  LABPROT 13.8  INR 1.1  CREATININE 1.16    Estimated Creatinine Clearance: 58.1 mL/min (by C-G formula based on SCr of 1.16 mg/dL).   Medical History: Past Medical History:  Diagnosis Date  . Acute kidney failure ([place])   . Acute respiratory failure ([place])   . Adenomatous polyp of stomach   . Anemia   . Benign prostate hyperplasia   . Dementia ([place])   . Depression   . Diabetes mellitus without complication ([place])   . DVT (deep venous thrombosis) ([place])   . Gastric ulcer   . GERD (gastroesophageal reflux disease)   . High cholesterol   . Hypertension   . Hypo-osmolality and hyponatremia   . Insomnia   . Neuromuscular dysfunction of bladder   . Osteoarthritis   . Partial intestinal obstruction ([place])   . Pneumonitis   . Protein-calorie malnutrition, moderate ([place])   . Stroke [facility])     Medications:  No current facility-administered medications on file prior to encounter.   Current Outpatient Medications on File Prior to Encounter  Medication Sig Dispense Refill  . acetaminophen (TYLENOL) 325 MG tablet Take 2 tablets (650 mg total) by mouth every 6 (six) hours as needed for mild pain, fever or headache (or Fever >/= 101). 12 tablet 0  . aMILoride (MIDAMOR) 5 MG tablet Take 1 tablet (5 mg total) by mouth daily. 30 tablet 5  . amitriptyline (ELAVIL) 10 MG tablet Take 10 mg by mouth at bedtime.    . [EXPIRED] amoxicillin-clavulanate (AUGMENTIN) 875-125 MG tablet Take 1 tablet by mouth 2 (two) times daily for 3 days. 6 tablet 0  . atorvastatin  (LIPITOR) 40 MG tablet Take 1 tablet (40 mg total) by mouth daily. 30 tablet 4  . Cholecalciferol (VITAMIN D3) 2000 units capsule TAKE 1 CAPSULE BY MOUTH ONCE DAILY. **DO NOT CRUSH** (Patient taking differently: Take 2,000 Units by mouth daily.) 30 capsule 5  . donepezil (ARICEPT) 10 MG tablet TAKE (1) TABLET BY MOUTH ONCE DAILY. (Patient taking differently: Take 10 mg by mouth daily.) 30 tablet PRN  . dronabinol (MARINOL) 2.5 MG capsule Take 2.5 mg by mouth 2 (two) times daily before lunch and supper.    . enoxaparin (LOVENOX) 60 MG/0.6ML injection Inject 0.6 mLs (60 mg total) into the skin every 12 (twelve) hours. Last Dose in am of Thursday 09/13/20, then Restart in the evening of Friday 09/14/20 after Gi Procedure if ok with Gi Physician-Dr. Mansouraty 6 mL 0  . feeding supplement (ENSURE ENLIVE / ENSURE PLUS) LIQD Take 237 mLs by mouth 2 (two) times daily between meals. 237 mL 12  . FEROSUL 325 (65 Fe) MG tablet TAKE 1 TABLET BY MOUTH TWICE DAILY. (Patient taking differently: Take 325 mg by mouth in the morning and at bedtime.) 163 tablet 3  . folic acid (FOLVITE) 846 MCG tablet Take 800 mcg by mouth daily.    . insulin aspart (NOVOLOG) 100 UNIT/ML FlexPen Inject 0-10 Units into the skin 3 (three) times daily with meals. insulin aspart (novoLOG) injection 0-10 Units 0-10  Units Subcutaneous, 3 times daily with meals CBG < 70: Implement Hypoglycemia Standing Orders and refer to Hypoglycemia Standing Orders sidebar report  CBG 70 - 120: 0 unit CBG 121 - 150: 0 unit  CBG 151 - 200: 1 unit CBG 201 - 250: 2 units CBG 251 - 300: 4 units CBG 301 - 350: 6 units  CBG 351 - 400: 8 units  CBG > 400: 10 units 15 mL 11  . Lactulose 20 GM/30ML SOLN Take 30 mLs (20 g total) by mouth See admin instructions. --Please give lactulose 20 g p.o. x1 if no bowel movement in 48 hours 236 mL 0  . magnesium oxide (MAG-OX) 400 MG tablet Take 2 tablets (800 mg total) by mouth 3 (three) times daily. 180 tablet 5  . memantine  (NAMENDA) 10 MG tablet Take 10 mg by mouth 2 (two) times daily.    . metFORMIN (GLUCOPHAGE) 1000 MG tablet Take 1 tablet (1,000 mg total) by mouth 2 (two) times daily with a meal. 60 tablet 3  . metoprolol tartrate (LOPRESSOR) 25 MG tablet Take 1 tablet (25 mg total) by mouth 2 (two) times daily. 60 tablet 3  . Na Sulfate-K Sulfate-Mg Sulf (SUPREP BOWEL PREP KIT) 17.5-3.13-1.6 GM/177ML SOLN Take 1 kit by mouth as directed. For colonoscopy prep 354 mL 0  . ondansetron (ZOFRAN) 4 MG tablet Take 1 tablet (4 mg total) by mouth every 6 (six) hours as needed for nausea. 20 tablet 0  . oxybutynin (DITROPAN-XL) 5 MG 24 hr tablet Take 5 mg by mouth daily.    . OXYGEN Inhale 2 L into the lungs daily.    . pantoprazole (PROTONIX) 40 MG tablet Take 1 tablet (40 mg total) by mouth daily. 90 tablet 3  . polyethylene glycol (MIRALAX) 17 g packet Take 17 g by mouth 2 (two) times daily. For Constipation 60 each 2  . senna-docusate (SENOKOT-S) 8.6-50 MG tablet Take 2 tablets by mouth at bedtime. 60 tablet 3  . sucralfate (CARAFATE) 1 g tablet Take 1 tablet (1 g total) by mouth 4 (four) times daily. 120 tablet 1  . tamsulosin (FLOMAX) 0.4 MG CAPS capsule TAKE 1 CAPSULE BY MOUTH AT BEDTIME. (Patient taking differently: Take 0.4 mg by mouth at bedtime.) 90 capsule 1  . traZODone (DESYREL) 100 MG tablet Take 100 mg by mouth at bedtime.    . vitamin B-12 (CYANOCOBALAMIN) 500 MCG tablet Take 1,000 mcg by mouth daily.    . [DISCONTINUED] ramipril (ALTACE) 10 MG capsule Take 2 capsules (20 mg total) by mouth daily. 60 capsule 3     Assessment: 70 y.o. male with h/o recent DVT for Lovenox Goal of Therapy:  Full anticoagulation with Lovenox Monitor platelets by anticoagulation protocol: Yes   Plan:  Lovenox 60 mg SQ q12h  Caryl Pina 09/16/2020,1:55 AM

## 2020-09-16 NOTE — Progress Notes (Signed)
Patient Demographics:    Daniel [name], is a 70 y.o. male, DOB - 08-Mar-1951, ZOX:[id number]  Admit date - 09/15/2020   Admitting Physician Bernadette Hoit, DO  Outpatient Primary MD for the patient is Leonie Douglas, MD  LOS - 1   Chief Complaint  Patient presents with  . Emesis        Subjective:    Daniel [name] today has no fevers, no further emesis,  No chest pain,    Assessment  & Plan :    Principal Problem:   Ileus ([place]) Active Problems:   Hypertension   BPH (benign prostatic hyperplasia)   Dementia (HCC)   Hyperglycemia due to diabetes mellitus (HCC)   Normocytic anemia   Multiple gastric ulcers   Esophagitis   Duodenal mass   DVT (deep venous thrombosis) (HCC)   Vomiting   Leukocytosis   Hypoalbuminemia due to protein-calorie malnutrition (HCC)   Hyperlipidemia   Pericardial cyst  Brief Summary:- 70 y.o. male with medical history significant for hypertension, T2DM,dementia, esophagitis, gastric ulcers, DVT and duodenal mass admitted from SNF on 09/15/2020 with concerns for hematemesis   A/p  1) hematemesis--- no further vomiting here, GI consult appreciated -IV Protonix as ordered -Monitor H&H and transfuse as needed -Defer to GI service if repeat EGD is needed at this time   2) history of severe constipation with recurrent pSBO-now presenting with suspected ileus CT abdomen and pelvis showed  stable dilated stomach and colon, compatible with ileus.  No findings to indicate interval gastric outlet obstruction -Reglan as advised by GI service  3)Severe constipation with recurrent partial bowel obstruction --laxatives as ordered  4)Duodenal mass patient had initial consultation with Dr. Rush Landmark on 09/14/2020 -Plan is for EGD with EUS and duodenal mass biopsy in June 2022 by Dr. 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 -Palliative care practitioner Ms. 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   9)RLE DVT - Dopplers apparently were done at outside facility -Hold Eliquis, treat with Lovenox pending repeat venous Dopplers  10) dementia--patient with baseline cognitive and memory deficits, continue Namenda and Aricept  11)Pericardial cyst CT abdomen and pelvis with contrast showed stable 5.6 x 2.4 cm oval area of fluid density adjacent to the posterolateral aspect of the heart on the left, compatible with a pericardial cyst.  12) esophagitis/PUD--- EGD from January 2022 showed distal  Moderately severe reflux esophagitis. Multiple gastric ulcers without bleeding stigmata and associated gastric erosions. -IV Protonix as above  Code Status:currently full,  Family Communication:has a legal guardian  Disposition/Need for in-Hospital Stay- patient unable to be discharged at this time due to --possible GI bleed requiring further observation and possible transfusion--- as well as IV Protonix and IV fluids until oral intake is reliable  Status is:  Inpatient  Remains inpatient appropriate because:Please see above   Disposition: The patient is from: SNF              Anticipated d/c is to: SNF              Anticipated d/c date is: 2 days              Patient currently is not medically stable to d/c. Barriers: Not Clinically Stable-   Code  Status : -  Code Status: Full Code   Family Communication:   Legal guardian  Consults  :  Gi  DVT Prophylaxis  :   - lovenox      Lab Results  Component Value Date   PLT 328 09/16/2020    Inpatient Medications  Scheduled Meds: . aMILoride  5 mg Oral Daily  . amitriptyline  10 mg Oral QHS  . atorvastatin  40 mg Oral QPM  . donepezil  5 mg Oral QHS  . enoxaparin (LOVENOX) injection  60 mg Subcutaneous BID  . insulin aspart  0-9 Units Subcutaneous Q4H  . memantine  5 mg Oral Daily  . [START ON 09/17/2020] metoCLOPramide (REGLAN) injection  5 mg Intravenous TID AC  . metoprolol tartrate  25 mg Oral BID  . phosphorus  250 mg Oral TID  . polyethylene glycol  17 g Oral BID  . senna-docusate  2 tablet Oral QHS   Continuous Infusions: . sodium chloride 50 mL/hr at 09/16/20 1718  . pantoprozole (PROTONIX) infusion 8 mg/hr (09/16/20 1718)   PRN Meds:.iopamidol, ondansetron (ZOFRAN) IV    Anti-infectives (From admission, onward)   None        Objective:   Vitals:   09/16/20 1000 09/16/20 1007 09/16/20 1100 09/16/20 1200  BP: 105/63 105/63 122/69 126/77  Pulse:  99    Resp: 16 16 (!) 21 15  Temp:  98.2 F (36.8 C)    TempSrc:  Oral    SpO2:  92%  93%  Weight:      Height:        Wt Readings from Last 3 Encounters:  09/15/20 66.5 kg  09/09/20 60 kg  08/28/20 68 kg     Intake/Output Summary (Last 24 hours) at 09/16/2020 1900 Last data filed at 09/16/2020 1813 Gross per 24 hour  Intake 2242.33 ml  Output --  Net 2242.33 ml     Physical Exam  Gen:- Awake Alert, no acute distress HEENT:- [place].AT, No sclera icterus Neck-Supple Neck,No JVD,.  Lungs-  CTAB , good air movement bilaterally  CV- S1, S2 normal, regular Abd-  +ve B.Sounds, Abd Soft, No tenderness,    Extremity/Skin:-No significant lower extremity swelling or tenderness,   good pulses Psych-affect is flat, not very talkative  neuro-generalized weakness, no new focal deficits, no tremors    Data  Review:   Micro Results Recent Results (from the past 240 hour(s))  Resp Panel by RT-PCR (Flu A&B, Covid) Nasopharyngeal Swab     Status: None   Collection Time: 09/08/20  1:10 AM   Specimen: Nasopharyngeal Swab; Nasopharyngeal(NP) swabs in vial transport medium  Result Value Ref Range Status   SARS Coronavirus 2 by RT PCR NEGATIVE NEGATIVE Final    Comment: (NOTE) SARS-CoV-2 target nucleic acids are NOT DETECTED.  The SARS-CoV-2 RNA is generally detectable in upper respiratory specimens during the acute phase of infection. The lowest concentration of SARS-CoV-2 viral copies this assay can detect is 138 copies/mL. A negative result does not preclude SARS-Cov-2  infection and should not be used as the sole basis for treatment or other patient management decisions. A negative result may occur with  improper specimen collection/handling, submission of specimen other than nasopharyngeal swab, presence of viral mutation(s) within the areas targeted by this assay, and inadequate number of viral copies(<138 copies/mL). A negative result must be combined with clinical observations, patient history, and epidemiological information. The expected result is Negative.  Fact Sheet for Patients:  EntrepreneurPulse.com.au  Fact Sheet for Healthcare Providers:  IncredibleEmployment.be  This test is no t yet approved or cleared by the [place] FDA and  has been authorized for detection and/or diagnosis of SARS-CoV-2 by FDA under an Emergency Use Authorization (EUA). This EUA will remain  in effect (meaning this test can be used) for the duration of the COVID-19 declaration under Section 564(b)(1) of the Act, 21 U.S.C.section 360bbb-3(b)(1), unless the authorization is terminated  or revoked sooner.       Influenza A by PCR NEGATIVE NEGATIVE Final   Influenza B by PCR NEGATIVE NEGATIVE Final    Comment: (NOTE) The Xpert Xpress SARS-CoV-2/FLU/RSV plus  assay is intended as an aid in the diagnosis of influenza from Nasopharyngeal swab specimens and should not be used as a sole basis for treatment. Nasal washings and aspirates are unacceptable for Xpert Xpress SARS-CoV-2/FLU/RSV testing.  Fact Sheet for Patients: EntrepreneurPulse.com.au  Fact Sheet for Healthcare Providers: IncredibleEmployment.be  This test is not yet approved or cleared by the [place] FDA and has been authorized for detection and/or diagnosis of SARS-CoV-2 by FDA under an Emergency Use Authorization (EUA). This EUA will remain in effect (meaning this test can be used) for the duration of the COVID-19 declaration under Section 564(b)(1) of the Act, 21 U.S.C. section 360bbb-3(b)(1), unless the authorization is terminated or revoked.  Performed at [facility], [address], [place], [place] [address]   MRSA PCR Screening     Status: None   Collection Time: 09/08/20  2:26 AM   Specimen: Nasal Mucosa; Nasopharyngeal  Result Value Ref Range Status   MRSA by PCR NEGATIVE NEGATIVE Final    Comment:        The GeneXpert MRSA Assay (FDA approved for NASAL specimens only), is one component of a comprehensive MRSA colonization surveillance program. It is not intended to diagnose MRSA infection nor to guide or monitor treatment for MRSA infections. Performed at [facility], [address], [place], [place] [address]   Resp Panel by RT-PCR (Flu A&B, Covid) Nasopharyngeal Swab     Status: None   Collection Time: 09/12/20  4:06 PM   Specimen: Nasopharyngeal Swab; Nasopharyngeal(NP) swabs in vial transport medium  Result Value Ref Range Status   SARS Coronavirus 2 by RT PCR NEGATIVE NEGATIVE Final    Comment: (NOTE) SARS-CoV-2 target nucleic acids are NOT DETECTED.  The SARS-CoV-2 RNA is generally detectable in upper respiratory specimens during the acute phase of infection. The lowest concentration of SARS-CoV-2 viral  copies this assay can detect is 138 copies/mL. A negative result does not preclude SARS-Cov-2 infection and should not be used as the sole basis for treatment or other patient management decisions. A negative result may occur with  improper specimen collection/handling, submission of specimen other than nasopharyngeal swab, presence of viral mutation(s) within the areas targeted by this assay, and inadequate number of viral copies(<138 copies/mL). A negative result must be combined with clinical observations, patient history, and epidemiological information. The expected result is Negative.  Fact Sheet for Patients:  EntrepreneurPulse.com.au  Fact Sheet for Healthcare Providers:  IncredibleEmployment.be  This test is no t yet approved or cleared by the [place] FDA and  has been authorized for detection and/or diagnosis of SARS-CoV-2 by FDA under an Emergency Use Authorization (EUA). This EUA will remain  in effect (meaning this test can be used) for the duration of the COVID-19 declaration under Section 564(b)(1) of the Act, 21 U.S.C.section 360bbb-3(b)(1), unless the authorization is terminated  or revoked sooner.       Influenza A by PCR NEGATIVE NEGATIVE Final   Influenza B by PCR NEGATIVE NEGATIVE Final    Comment: (NOTE) The Xpert Xpress SARS-CoV-2/FLU/RSV plus assay is intended as an aid in the diagnosis of influenza from Nasopharyngeal swab specimens and should not be used as a sole basis for treatment. Nasal washings and aspirates are unacceptable for Xpert Xpress SARS-CoV-2/FLU/RSV testing.  Fact Sheet for Patients: EntrepreneurPulse.com.au  Fact Sheet for Healthcare Providers: IncredibleEmployment.be  This test is not yet approved or cleared by the [place] FDA and has been authorized for detection and/or diagnosis of SARS-CoV-2 by FDA under an Emergency Use Authorization (EUA). This  EUA will remain in effect (meaning this test can be used) for the duration of the COVID-19 declaration under Section 564(b)(1) of the Act, 21 U.S.C. section 360bbb-3(b)(1), unless the authorization is terminated or revoked.  Performed at [facility], [address], [place], [place] [address]   SARS CORONAVIRUS 2 (TAT 6-24 HRS) Nasopharyngeal Nasopharyngeal Swab     Status: None   Collection Time: 09/15/20 11:35 PM   Specimen: Nasopharyngeal Swab  Result Value Ref Range Status   SARS Coronavirus 2 NEGATIVE NEGATIVE Final    Comment: (NOTE) SARS-CoV-2 target nucleic acids are NOT DETECTED.  The SARS-CoV-2 RNA is generally detectable in upper and lower respiratory specimens during the acute phase of infection. Negative results do not preclude SARS-CoV-2 infection, do not rule out co-infections with other pathogens, and should not be used as the sole basis for treatment or other patient management decisions. Negative results must be combined with clinical observations, patient history, and epidemiological information. The expected result is Negative.  Fact Sheet for Patients: SugarRoll.be  Fact Sheet for Healthcare Providers: https://www.woods-mathews.com/  This test is not yet approved or cleared by the [place] FDA and  has been authorized for detection and/or diagnosis of SARS-CoV-2 by FDA under an Emergency Use Authorization (EUA). This EUA will remain  in effect (meaning this test can be used) for the duration of the COVID-19 declaration under Se ction 564(b)(1) of the Act, 21 U.S.C. section 360bbb-3(b)(1), unless the authorization is terminated or revoked sooner.  Performed at [facility], [place] [address], [place], [place] [address]     Radiology Reports DG Abd 1 View  Result Date: 09/09/2020 CLINICAL DATA:  Gastric distension EXAM: ABDOMEN - 1 VIEW COMPARISON:  September 08, 2020 FINDINGS: Nasogastric tube is seen with  its distal tip noted along the expected region of the gastric fundus. Air is seen throughout the large and small bowel with mildly dilated small bowel loops seen within the left lower quadrant. No radio-opaque calculi or other significant radiographic abnormality are seen. 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 IMPRESSION: 1. Nasogastric tube positioning, as described above. 2. Additional findings suggestive of an early partial small bowel obstruction versus ileus. Electronically Signed   By: Virgina Norfolk M.D.   On: 09/09/2020 18:31   DG Abdomen 1 View  Result Date: 09/08/2020 CLINICAL DATA:  Nasogastric tube placement EXAM: ABDOMEN - 1 VIEW COMPARISON:  09/07/2020 FINDINGS: Nasogastric tube tip and side port project within the stomach. Dilated and gas-filled bowel seen throughout the abdomen. Excreted contrast in the urinary bladder and right renal collecting system. IMPRESSION: Nasogastric tube tip and side port in the stomach. Electronically Signed   By: Ulyses Jarred M.D.   On: 09/08/2020 00:54   CT ABDOMEN PELVIS W CONTRAST  Result Date: 09/15/2020 CLINICAL DATA:  Gastrointestinal bleeding. Projectile vomiting with coffee-ground brown emesis. EXAM: CT ABDOMEN AND PELVIS WITH CONTRAST TECHNIQUE: Multidetector CT imaging of the abdomen and pelvis was performed using the standard protocol following bolus administration of intravenous contrast. CONTRAST:  130mL OMNIPAQUE 300 COMPARISON:  09/07/2020 FINDINGS: Lower chest: Stable 5.6 x 2.4 cm oval area of fluid density adjacent to the posterolateral aspect of the heart on the left on image number 14/6. Diffuse low density distal esophageal wall thickening with improvement. Normal sized heart. Atheromatous coronary artery calcifications. Patchy densities in the right lower lobe with some consolidation with mild improvement. Patchy densities in the posterior right upper lobe with mild improvement. Small amount of interval dependent patchy density in the left  lower lobe. No pleural fluid. Hepatobiliary: No focal liver abnormality is seen. No gallstones, gallbladder wall thickening, or biliary dilatation. Pancreas: Stable normal appearing pancreatic head and proximal neck. 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 Spleen: Normal in size without focal abnormality. Adrenals/Urinary Tract: Normal appearing adrenal glands. Stable right renal cysts and small parenchymal calcification. Stable possible small, severely atrophied left kidney. Unremarkable urinary bladder and right ureter. Stomach/Bowel: Stable dilated stomach and colon. Normal caliber small bowel. No evidence of appendicitis. Vascular/Lymphatic: No significant vascular findings are present. No enlarged abdominal or pelvic lymph nodes. Reproductive: Prostate is unremarkable. Other: Small umbilical hernia containing fat. Musculoskeletal: Stable left hip prosthesis. Lumbar and lower thoracic spine degenerative changes. Stable bilateral L5 pars defects with grade 2 anterolisthesis and vertebral body fusion at the L5-S1 level. IMPRESSION: 1. No acute abnormality. 2. Improving distal esophagitis. 3. Stable dilated stomach and colon, compatible with ileus. No findings to indicate interval gastric outlet obstruction. 4. Mildly improved bilateral lung opacities, compatible with improving pneumonia. 5. Stable 5.6 x 2.4 cm oval area of fluid density adjacent to the posterolateral aspect of the heart on the left, compatible with a pericardial cyst. 6. 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 7. Stable possible small, severely atrophied left kidney. Electronically Signed   By: Claudie Revering M.D.   On: 09/15/2020 21:58   CT ABDOMEN PELVIS W CONTRAST  Result Date: 09/07/2020 CLINICAL DATA:  Nausea and vomiting coffee ground emesis EXAM: CT ABDOMEN AND PELVIS WITH CONTRAST TECHNIQUE: Multidetector CT imaging of the abdomen and pelvis was  performed using the standard protocol following bolus administration of intravenous contrast. CONTRAST:  142mL OMNIPAQUE IOHEXOL 300 MG/ML  SOLN COMPARISON:  09/07/2020, CT 08/24/2020, CT 07/27/2020 FINDINGS: Lower chest: Lung bases demonstrate ground-glass density and patchy consolidation in the right lower lobe and posterior right lower lobe. No pleural effusion. Normal cardiac size. Marked circumferential wall thickening of the distal esophagus. Hepatobiliary: No calcified gallstone. No focal hepatic abnormality or biliary dilatation Pancreas: Truncated appearance of pancreas.  No inflammatory change. Spleen: Normal in size without focal abnormality. Adrenals/Urinary Tract: Adrenal glands are normal. No definitive left renal tissue identified, question very diminutive atrophic left kidney, series 3, image number 41. Small cysts in the right kidney. Subcentimeter hypodense  lesions too small to further characterize. The bladder is normal. Stomach/Bowel: Marked fluid distension of the stomach. Some fluid-filled nondistended small bowel in the pelvis. Large volume of stool in the colon. Marked air distension of colon in the right upper quadrant, this appears to be sigmoid colon. Slightly narrowed appearing sigmoid colon that traverses horizontally and extends to right upper quadrant, series 3 image number 55 through 65. Considerable stool distal to the loop of dilated sigmoid colon with large feces in the rectum. No acute bowel wall thickening. No corkscrew appearance of mesentery or vasculature in the region of sigmoid colon narrowing. Similar configuration of the colon on 07/27/2020 but more normal course of sigmoid colon on CT from January of 2022. Vascular/Lymphatic: Nonaneurysmal aorta. Aortic atherosclerosis. No suspicious nodes Reproductive: Prostate is unremarkable. Other: Negative for free air or free fluid Musculoskeletal: Chronic pars defects at L5 with 15 mm anterolisthesis L5 on S1 and ankylosis across  the L5-S1 disc space. Left hip arthroplasty with artifact. IMPRESSION: 1. Interval marked circumferential wall thickening of the distal esophagus, probably due to esophagitis. Marked fluid distension of the stomach without evidence for small bowel obstruction, question functional obstruction. Consider NG tube decompression. 2. Generalized distension of the colon with large feces throughout. More focally dilated loop of bowel in the right upper quadrant appears to represent dilated segment of sigmoid colon. Slightly aberrant course of sigmoid colon which appears slightly narrowed at the right lower quadrant with adjacent slightly narrowed segment of rectosigmoid colon, question some degree of colon obstruction, potentially from internal hernia. No corkscrew appearance to suggest a volvulus. Considerable stool within the rectosigmoid colon distal to this suggesting that obstruction may be partial. Note that a similar configuration of the colon was present on CT from 07/27/2020 3. Ground-glass density and patchy consolidation in the right lower lobe and posterior right upper lobe concerning for pneumonia or aspiration. 4. Absent left kidney Aortic Atherosclerosis (ICD10-I70.0). Electronically Signed   By: Donavan Foil M.D.   On: 09/07/2020 22:57   DG Chest Port 1 View  Result Date: 09/15/2020 CLINICAL DATA:  Coffee-ground emesis EXAM: PORTABLE CHEST 1 VIEW COMPARISON:  07/27/2020 FINDINGS: The heart size and mediastinal contours are within normal limits. Both lungs are clear. Unchanged elevation of the bilateral hemidiaphragms with associated scarring or atelectasis. The visualized skeletal structures are unremarkable. IMPRESSION: No acute abnormality of the lungs in AP portable projection. Electronically Signed   By: Eddie Candle M.D.   On: 09/15/2020 19:14   DG ABD ACUTE 2+V W 1V CHEST  Result Date: 09/07/2020 CLINICAL DATA:  Abdominal distension, emesis and lower extremity swelling EXAM: DG ABDOMEN ACUTE WITH  1 VIEW CHEST COMPARISON:  CT 08/24/2020, MR 06/25/2020, CT 06/19/2020 FINDINGS: There is extensive severe and increasing air distention of what appears to be the colon given some haustra markings although several distended loops of bowel in the low pelvis could reflect some small bowel dilatation as well. A large volume of feculent material is noted within portions of the distended colon as well. No convincing evidence of subdiaphragmatic free air at this time. No suspicious abdominal calcifications. Lung volumes are markedly diminished. Some streaky and patchy opacities in the lung bases may reflect atelectasis, airspace disease or sequela of aspiration in the provided clinical context. Cardiomediastinal contours are unremarkable for the portable upright technique. No pneumothorax. No layering pleural effusion. Telemetry leads overlie the chest and abdomen with nasal cannula the base of the neck. Multilevel degenerative changes in the shoulders, spine, hips and  pelvis with a prior left hip arthroplasty. IMPRESSION: Severe and increasing air distention of what appears to be the colon given some haustra markings although several distended loops of bowel in the low pelvis could reflect some small bowel dilatation as well. Large volume of feculent material within portions of the distended colon as well. Appearance could reflect a colonic obstruction or pseudo-obstruction versus ileus. Electronically Signed   By: Lovena Le M.D.   On: 09/07/2020 20:58   DG Abd Portable 2V  Result Date: 09/10/2020 CLINICAL DATA:  Chronic constipation and bowel obstruction EXAM: PORTABLE ABDOMEN - 2 VIEW COMPARISON:  September 09, 2020 FINDINGS: Supine and upright images obtained. Nasogastric tube tip and side port in stomach. There is moderate stool in the colon. There is mild transverse colonic dilatation with several loops of small bowel upper normal in size. No air-fluid levels. No free air evident. Atelectatic change noted in right  lung base. IMPRESSION: Nasogastric tube tip and side port in stomach. Moderate stool in colon. Borderline prominence of bowel loops potentially could indicate enteritis or ileus. Bowel obstruction not felt to be likely. No free air. Atelectatic change right lung base. Electronically Signed   By: Lowella Grip III M.D.   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 Follow-up study. EXAM: CT ABDOMEN WITHOUT AND WITH CONTRAST TECHNIQUE: Multidetector CT imaging of the abdomen was performed following the standard protocol before and following the bolus administration of intravenous contrast. CONTRAST:  151mL OMNIPAQUE IOHEXOL 300 MG/ML  SOLN COMPARISON:  CT the abdomen and pelvis 06/19/2020. MRI of the abdomen with MRCP 06/25/2020. FINDINGS: Lower chest: Areas of subsegmental atelectasis or scarring are noted in the right lung base. Hepatobiliary: No suspicious cystic or solid hepatic lesions. No intra or extrahepatic biliary ductal dilatation. Gallbladder is normal in appearance. Pancreas: No pancreatic mass. No pancreatic ductal dilatation. No pancreatic or peripancreatic fluid collections or inflammatory changes. 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 Spleen: Unremarkable. 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 No surgical clips are noted in the left retroperitoneum. Multiple subcentimeter low-attenuation lesions in the right kidney, too small to definitively characterize, but likely to represent cysts based on their appearance on prior abdominal MRI. Small nonobstructive calculi are present within the right renal collecting system measuring up to 5 mm in the interpolar region. No suspicious right renal lesions. No right hydroureteronephrosis in the visualized portions of the  abdomen. Bilateral adrenal glands are normal in appearance. Stomach/Bowel: Normal appearance of the stomach. No pathologic dilatation of the visualized portions of small bowel or colon. Vascular/Lymphatic: No aneurysm identified in the visualized abdominal vasculature. No lymphadenopathy noted in the abdomen. Other: Small umbilical hernia containing only omental fat. No significant volume of ascites and no pneumoperitoneum noted in the visualized portions of the peritoneal cavity. Musculoskeletal: No aggressive appearing osseous lesions are noted in the visualized portions of the skeleton. Status post left hip arthroplasty. IMPRESSION: 1. No pancreatic ductal dilatation. 2. No acute findings in the abdomen. 3. Incidental findings, as above. Electronically Signed   By: Vinnie Langton M.D.   On: 08/25/2020 06:34     CBC Recent Labs  Lab 09/10/20 0621 09/11/20 0425 09/15/20 1850 09/16/20 0555  WBC 8.2 5.1 13.3* 12.2*  HGB 8.7* 8.3* 11.7* 9.6*  HCT 28.2* 25.7* 36.6* 29.7*  PLT 371  329 399 328  MCV 87.6 84.8 84.9 85.8  MCH 27.0 27.4 27.1 27.7  MCHC 30.9 32.3 32.0 32.3  RDW 16.6* 16.6* 16.5* 17.0*  LYMPHSABS  --   --  0.6*  --   MONOABS  --   --  0.7  --   EOSABS  --   --  0.0  --   BASOSABS  --   --  0.0  --     Chemistries  Recent Labs  Lab 09/10/20 0621 09/11/20 0425 09/12/20 0529 09/15/20 1850 09/16/20 0555  NA 145 140 135 135 135  K 3.1* 3.3* 3.5 4.8 4.1  CL 100 100 96* 92* 99  CO2 27 31 27 29 26   GLUCOSE 120* 171* 173* 221* 122*  BUN 22 17 9  25* 24*  CREATININE 1.21 1.00 0.82 1.16 1.16  CALCIUM 10.8* 9.5 9.3 11.6* 10.3  MG 1.6* 1.5* 1.7  --  1.6*  AST 10* 9* 18 24 15   ALT 21 17 24  34 25  ALKPHOS 84 74 94 102 82  BILITOT 1.3* 0.6 0.4 0.5 0.7   ------------------------------------------------------------------------------------------------------------------ No results for input(s): CHOL, HDL, LDLCALC, TRIG, CHOLHDL, LDLDIRECT in the last 72 hours.  Lab Results   Component Value Date   HGBA1C 7.1 (H) 09/08/2020   ------------------------------------------------------------------------------------------------------------------ No results for input(s): TSH, T4TOTAL, T3FREE, THYROIDAB in the last 72 hours.  Invalid input(s): FREET3 ------------------------------------------------------------------------------------------------------------------ No results for input(s): VITAMINB12, FOLATE, FERRITIN, TIBC, IRON, RETICCTPCT in the last 72 hours.  Coagulation profile Recent Labs  Lab 09/15/20 1850 09/16/20 0555  INR 1.1 1.2    No results for input(s): DDIMER in the last 72 hours.  Cardiac Enzymes No results for input(s): CKMB, TROPONINI, MYOGLOBIN in the last 168 hours.  Invalid input(s): CK ------------------------------------------------------------------------------------------------------------------ No results found for: BNP   Roxan Hockey M.D on 09/16/2020 at 7:00 PM  Go to [email or web address] - for contact info  [facility] - Office  [phone]

## 2020-09-16 NOTE — Consult Note (Signed)
Referring Provider: No ref. provider found Primary Care Physician:  Leonie Douglas, MD Primary Gastroenterologist:  Dr. Laural Golden  Reason for Consultation:    Coffee-ground emesis.  HPI:   History is obtained primarily from the chart as patient unable to provide any in-depth information. Patient is a resident of [facility].  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 Gala Romney gastric ulcer biopsy negative for H. Pylori)), hypertension diabetes mellitus cognitive impairment who was recently admitted to this facility on 09/07/2020 for new diagnosis of right DVT.  He was discharged on Eliquis 4 days ago.  He also has a history of elevated CEA.  Regarding large duodenal adenoma he was referred to Dr. Justice Britain of [facility] gastroenterology.  Patient was seen by him 2 days ago.  He was scheduled for esophagogastroduodenoscopy with EMR of duodenal polyp and colonoscopy on 11/15/2020. Patient was brought to the emergency room last evening via EMS for coffee-ground emesis.  Evaluation in emergency room revealed heart rate of 135, blood pressure of 102/77 and he was afebrile.  His hemoglobin was 11.7 g and hematocrit 36.6.  His H&H was 8.3 and 25.7 respectively on 09/11/2020.  Comprehensive chemistry panel was pertinent for glucose of 221 BUN of 25 creatinine of 1.16 and calcium was 11.6.  His calcium 3 days earlier was 9.3.  INR was 1.1.  Lactic acid level was normal at 1.8. Patient was begun on IV fluids and pantoprazole infusion. Repeat lab from this morning revealed hemoglobin of 9.6 and hematocrit of 29.7. Serum calcium was 10.3 and BUN was 24.  Patient denies nausea vomiting chest pain dysphagia or abdominal pain.  No history of melena or rectal bleeding.  All the patient remembers about this illness is that he vomited yesterday.  He also denies  shortness of breath. Patient is not sure how long he has been cared for at [facility].  He says he has 2 sisters one of his sisters living.  One is a nursing home. Both parents are disease. Patient has never been married and he does not have any children.     Past Medical History:  Diagnosis Date  . Acute kidney failure ([place])   . Acute respiratory failure ([place])   . Adenomatous polyp of stomach   . Anemia   . Benign prostate hyperplasia   . Dementia ([place])   . Depression   . Diabetes mellitus without complication ([place])   . DVT (deep venous thrombosis) ([place])   . Gastric ulcer   . GERD (gastroesophageal reflux disease)   . High cholesterol   . Hypertension   . Hypo-osmolality and hyponatremia   . Insomnia   . Neuromuscular dysfunction of bladder   . Osteoarthritis   . Partial intestinal obstruction ([place])   . Pneumonitis   . Protein-calorie malnutrition, moderate ([place])   . Stroke [facility])     Past Surgical History:  Procedure Laterality Date  . BIOPSY  06/21/2020   Procedure: BIOPSY;  Surgeon: Daneil Dolin, MD;  Location[facility];  Service: Endoscopy;;  gastric  . ESOPHAGOGASTRODUODENOSCOPY (EGD) WITH PROPOFOL N/A 06/21/2020   Procedure: ESOPHAGOGASTRODUODENOSCOPY (EGD) WITH PROPOFOL;  Surgeon: Daneil Dolin, MD;  Location: [facility];  Service: Endoscopy;  Laterality: N/A;  . HIP SURGERY Left   . KIDNEY SURGERY     kidney removed    Prior to Admission medications   Medication Sig  Start Date End Date Taking? Authorizing Provider  acetaminophen (TYLENOL) 325 MG tablet Take 2 tablets (650 mg total) by mouth every 6 (six) hours as needed for mild pain, fever or headache (or Fever >/= 101). 08/30/20   Emokpae, Courage, MD  aMILoride (MIDAMOR) 5 MG tablet Take 1 tablet (5 mg total) by mouth daily. 08/31/20   Roxan Hockey, MD  amitriptyline (ELAVIL) 10 MG tablet Take 10 mg by mouth at bedtime. 04/30/20   [provider]  atorvastatin (LIPITOR) 40 MG tablet  Take 1 tablet (40 mg total) by mouth daily. 08/30/20 09/29/20  Roxan Hockey, MD  Cholecalciferol (VITAMIN D3) 2000 units capsule TAKE 1 CAPSULE BY MOUTH ONCE DAILY. **DO NOT CRUSH** Patient taking differently: Take 2,000 Units by mouth daily. 01/06/18   Fayrene Helper, MD  donepezil (ARICEPT) 10 MG tablet TAKE (1) TABLET BY MOUTH ONCE DAILY. Patient taking differently: Take 10 mg by mouth daily. 06/15/17   Raylene Everts, MD  dronabinol (MARINOL) 2.5 MG capsule Take 2.5 mg by mouth 2 (two) times daily before lunch and supper.    [provider]  enoxaparin (LOVENOX) 60 MG/0.6ML injection Inject 0.6 mLs (60 mg total) into the skin every 12 (twelve) hours. Last Dose in am of Thursday 09/13/20, then Restart in the evening of Friday 09/14/20 after Gi Procedure if ok with Gi Physician-Dr. Mansouraty 09/12/20   Roxan Hockey, MD  feeding supplement (ENSURE ENLIVE / ENSURE PLUS) LIQD Take 237 mLs by mouth 2 (two) times daily between meals. 06/22/20   Manuella Ghazi, Pratik D, DO  FEROSUL 325 (65 Fe) MG tablet TAKE 1 TABLET BY MOUTH TWICE DAILY. Patient taking differently: Take 325 mg by mouth in the morning and at bedtime. 06/15/17   Raylene Everts, MD  folic acid (FOLVITE) 409 MCG tablet Take 800 mcg by mouth daily.    [provider]  insulin aspart (NOVOLOG) 100 UNIT/ML FlexPen Inject 0-10 Units into the skin 3 (three) times daily with meals. insulin aspart (novoLOG) injection 0-10 Units 0-10 Units Subcutaneous, 3 times daily with meals CBG < 70: Implement Hypoglycemia Standing Orders and refer to Hypoglycemia Standing Orders sidebar report  CBG 70 - 120: 0 unit CBG 121 - 150: 0 unit  CBG 151 - 200: 1 unit CBG 201 - 250: 2 units CBG 251 - 300: 4 units CBG 301 - 350: 6 units  CBG 351 - 400: 8 units  CBG > 400: 10 units 08/30/20   Emokpae, Courage, MD  Lactulose 20 GM/30ML SOLN Take 30 mLs (20 g total) by mouth See admin instructions. --Please give lactulose 20 g p.o. x1 if no bowel movement in  48 hours 09/12/20   Roxan Hockey, MD  magnesium oxide (MAG-OX) 400 MG tablet Take 2 tablets (800 mg total) by mouth 3 (three) times daily. 08/30/20   Roxan Hockey, MD  memantine (NAMENDA) 10 MG tablet Take 10 mg by mouth 2 (two) times daily. 06/07/20   [provider]  metFORMIN (GLUCOPHAGE) 1000 MG tablet Take 1 tablet (1,000 mg total) by mouth 2 (two) times daily with a meal. 06/08/20   Barton Dubois, MD  metoprolol tartrate (LOPRESSOR) 25 MG tablet Take 1 tablet (25 mg total) by mouth 2 (two) times daily. 08/30/20 09/29/20  Roxan Hockey, MD  Na Sulfate-K Sulfate-Mg Sulf (SUPREP BOWEL PREP KIT) 17.5-3.13-1.6 GM/177ML SOLN Take 1 kit by mouth as directed. For colonoscopy prep 09/14/20   Mansouraty, Telford Nab., MD  ondansetron (ZOFRAN) 4 MG tablet Take 1 tablet (  4 mg total) by mouth every 6 (six) hours as needed for nausea. 06/22/20   Manuella Ghazi, Pratik D, DO  oxybutynin (DITROPAN-XL) 5 MG 24 hr tablet Take 5 mg by mouth daily. 01/04/20   [provider]  OXYGEN Inhale 2 L into the lungs daily.    [provider]  pantoprazole (PROTONIX) 40 MG tablet Take 1 tablet (40 mg total) by mouth daily. 08/30/20 11/28/20  Roxan Hockey, MD  polyethylene glycol (MIRALAX) 17 g packet Take 17 g by mouth 2 (two) times daily. For Constipation 09/12/20   Roxan Hockey, MD  senna-docusate (SENOKOT-S) 8.6-50 MG tablet Take 2 tablets by mouth at bedtime. 09/12/20 09/12/21  Roxan Hockey, MD  sucralfate (CARAFATE) 1 g tablet Take 1 tablet (1 g total) by mouth 4 (four) times daily. 06/22/20 06/22/21  Manuella Ghazi, Pratik D, DO  tamsulosin (FLOMAX) 0.4 MG CAPS capsule TAKE 1 CAPSULE BY MOUTH AT BEDTIME. Patient taking differently: Take 0.4 mg by mouth at bedtime. 06/15/17   Raylene Everts, MD  traZODone (DESYREL) 100 MG tablet Take 100 mg by mouth at bedtime. 01/04/20   [provider]  vitamin B-12 (CYANOCOBALAMIN) 500 MCG tablet Take 1,000 mcg by mouth daily.    [provider]   ramipril (ALTACE) 10 MG capsule Take 2 capsules (20 mg total) by mouth daily. 06/08/20 06/16/20  Barton Dubois, MD    Current Facility-Administered Medications  Medication Dose Route Frequency Provider Last Rate Last Admin  . enoxaparin (LOVENOX) injection 60 mg  60 mg Subcutaneous BID Adefeso, Oladapo, DO      . insulin aspart (novoLOG) injection 0-9 Units  0-9 Units Subcutaneous Q4H Adefeso, Oladapo, DO   1 Units at 09/16/20 0845  . iopamidol (ISOVUE-300) 61 % injection 80 mL  80 mL Intravenous Once PRN Adefeso, Oladapo, DO      . [START ON 09/17/2020] metoCLOPramide (REGLAN) injection 5 mg  5 mg Intravenous TID AC Lynelle Weiler U, MD      . ondansetron (ZOFRAN) injection 4 mg  4 mg Intravenous Q6H PRN Adefeso, Oladapo, DO      . pantoprazole (PROTONIX) 80 mg in sodium chloride 0.9 % 100 mL (0.8 mg/mL) infusion  8 mg/hr Intravenous Continuous Adefeso, Oladapo, DO 10 mL/hr at 09/16/20 0652 8 mg/hr at 09/16/20 0652  . phosphorus (K PHOS NEUTRAL) tablet 250 mg  250 mg Oral TID Roxan Hockey, MD   250 mg at 09/16/20 1044    Allergies as of 09/15/2020  . (No Known Allergies)    Family History  Problem Relation Age of Onset  . Hypertension Sister   . Hyperlipidemia Sister   . Osteoarthritis Sister   . Rheum arthritis Sister   . Breast cancer Niece   . Colon cancer Neg Hx   . Esophageal cancer Neg Hx   . Inflammatory bowel disease Neg Hx   . Liver disease Neg Hx   . Pancreatic cancer Neg Hx   . Stomach cancer Neg Hx   . Rectal cancer Neg Hx     Social History   Socioeconomic History  . Marital status: Single    Spouse name: Not on file  . Number of children: Not on file  . Years of education: Not on file  . Highest education level: Not on file  Occupational History  . Not on file  Tobacco Use  . Smoking status: Never Smoker  . Smokeless tobacco: Never Used  Vaping Use  . Vaping Use: Never used  Substance and Sexual Activity  .  Alcohol use: No  . Drug use: No  .  Sexual activity: Never  Other Topics Concern  . Not on file  Social History Narrative  . Not on file   Social Determinants of Health   Financial Resource Strain: Not on file  Food Insecurity: Not on file  Transportation Needs: Not on file  Physical Activity: Not on file  Stress: Not on file  Social Connections: Not on file  Intimate Partner Violence: Not on file    Review of Systems: See HPI, otherwise normal ROS  Physical Exam: Temp:  [98.2 F (36.8 C)-98.4 F (36.9 C)] 98.2 F (36.8 C) (04/10 1007) Pulse Rate:  [96-138] 99 (04/10 1007) Resp:  [14-22] 15 (04/10 1200) BP: (94-130)/(59-87) 126/77 (04/10 1200) SpO2:  [90 %-95 %] 93 % (04/10 1200) FiO2 (%):  [21 %] 21 % (04/10 0023) Weight:  [66.5 kg] 66.5 kg (04/09 1826)   Patient is alert and responds appropriately to questions. 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 Conjunctivae is pale.  Sclerae nonicteric. Oropharyngeal mucosa is normal.  Few of the teeth are missing and remaining teeth in satisfactory condition. Neck without masses adenopathy or thyromegaly. Cardiac exam with regular rhythm normal S1 and S2.  No murmur gallop noted. Auscultation of lungs reveal vesicular breath sounds bilaterally.  No rales or rhonchi. Abdomen is symmetrical.  He has umbilical hernia with focal pigmentation to skin.  This hernia is completely reducible.  Bowel sounds are normal.  On palpation abdomen is soft and nontender with organomegaly or masses. He does not have clubbing or koilonychia. He has trace pitting edema to right ankle along the medial aspect.  No calf tenderness noted.  Lab Results: Recent Labs    09/15/20 1850 09/16/20 0555  WBC 13.3* 12.2*  HGB 11.7* 9.6*  HCT 36.6* 29.7*  PLT 399 328   BMET Recent Labs    09/15/20 1850 09/16/20 0555  NA 135 135  K 4.8 4.1  CL 92* 99  CO2 29 26  GLUCOSE 221* 122*  BUN 25* 24*  CREATININE 1.16 1.16  CALCIUM 11.6* 10.3   LFT Recent Labs     09/16/20 0555  PROT 5.8*  ALBUMIN 2.7*  AST 15  ALT 25  ALKPHOS 82  BILITOT 0.7   PT/INR Recent Labs    09/15/20 1850 09/16/20 0555  LABPROT 13.8 15.1  INR 1.1 1.2   Hepatitis Panel No results for input(s): HEPBSAG, HCVAB, HEPAIGM, HEPBIGM in the last 72 hours.  Studies/Results: CT ABDOMEN PELVIS W CONTRAST  Result Date: 09/15/2020 CLINICAL DATA:  Gastrointestinal bleeding. Projectile vomiting with coffee-ground brown emesis. EXAM: CT ABDOMEN AND PELVIS WITH CONTRAST TECHNIQUE: Multidetector CT imaging of the abdomen and pelvis was performed using the standard protocol following bolus administration of intravenous contrast. CONTRAST:  128m OMNIPAQUE 300 COMPARISON:  09/07/2020 FINDINGS: Lower chest: Stable 5.6 x 2.4 cm oval area of fluid density adjacent to the posterolateral aspect of the heart on the left on image number 14/6. Diffuse low density distal esophageal wall thickening with improvement. Normal sized heart. Atheromatous coronary artery calcifications. Patchy densities in the right lower lobe with some consolidation with mild improvement. Patchy densities in the posterior right upper lobe with mild improvement. Small amount of interval dependent patchy density in the left lower lobe. No pleural fluid. Hepatobiliary: No focal liver abnormality is seen. No gallstones, gallbladder wall thickening, or biliary dilatation. Pancreas: Stable normal appearing pancreatic head and proximal neck. 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 Spleen: Normal in size without focal abnormality. Adrenals/Urinary Tract: Normal appearing adrenal glands. Stable right renal cysts and small parenchymal calcification. Stable possible small, severely atrophied left kidney. Unremarkable urinary bladder and right ureter. Stomach/Bowel: Stable dilated stomach and colon. Normal caliber small bowel. No evidence of appendicitis. Vascular/Lymphatic: No significant vascular findings are present. No  enlarged abdominal or pelvic lymph nodes. Reproductive: Prostate is unremarkable. Other: Small umbilical hernia containing fat. Musculoskeletal: Stable left hip prosthesis. Lumbar and lower thoracic spine degenerative changes. Stable bilateral L5 pars defects with grade 2 anterolisthesis and vertebral body fusion at the L5-S1 level. IMPRESSION: 1. No acute abnormality. 2. Improving distal esophagitis. 3. Stable dilated stomach and colon, compatible with ileus. No findings to indicate interval gastric outlet obstruction. 4. Mildly improved bilateral lung opacities, compatible with improving pneumonia. 5. Stable 5.6 x 2.4 cm oval area of fluid density adjacent to the posterolateral aspect of the heart on the left, compatible with a pericardial cyst. 6. 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 7. Stable possible small, severely atrophied left kidney. Electronically Signed   By: Claudie Revering M.D.   On: 09/15/2020 21:58   DG Chest Port 1 View  Result Date: 09/15/2020 CLINICAL DATA:  Coffee-ground emesis EXAM: PORTABLE CHEST 1 VIEW COMPARISON:  07/27/2020 FINDINGS: The heart size and mediastinal contours are within normal limits. Both lungs are clear. Unchanged elevation of the bilateral hemidiaphragms with associated scarring or atelectasis. The visualized skeletal structures are unremarkable. IMPRESSION: No acute abnormality of the lungs in AP portable projection. Electronically Signed   By: Eddie Candle M.D.   On: 09/15/2020 19:14    Assessment;  #1.  Upper GI bleed.  Patient is hemodynamically stable.  He has not had any more hematemesis.  Drop in hemoglobin appears to be due to IV fluids.  He has a history of severe reflux esophagitis gastric and duodenal ulcers which are well documented by Dr. Gala Romney on EGD of 12 weeks ago.  I suspect he could be bleeding from any 1 of these lesions.  Other less likely source would be large duodenal adenoma  discovered on EGD 12 weeks ago.  He could also have Mallory-Weiss tear.  I suspect underlying gastroparesis given limited physical activity and the fact that he is diabetic.  #2.  Mild hypercalcemia most likely secondary to dehydration.  Serum calcium is returned to normal.  It definitely needs to be followed up.  #3.  Large duodenal adenoma.  Patient is scheduled for EGD with EMR by Dr. Rush Landmark in June 2022 at which time hopefully we can document complete healing of these ulcers.  #4.  Right DVT.  This was diagnosed over a week ago.  Anticoagulation is on hold. He can be started on heparin in case endoscopic intervention needed.  #5.  History of elevated CEA.  I cannot find any record in Epic.  I see Cologuard was recommended but never completed.  He is scheduled for colonoscopy in June by Dr. Rush Landmark.  Recommendations;  Clear liquids. CBC in a.m. Metoclopramide 5 mg IV before each meal. Continue pantoprazole infusion. Will reevaluate patient in a.m and determine need for esophagogastroduodenoscopy   LOS: 1 day   George Alcantar  09/16/2020, 4:27 PM

## 2020-09-17 ENCOUNTER — Inpatient Hospital Stay (HOSPITAL_COMMUNITY): Payer: Medicare Other

## 2020-09-17 ENCOUNTER — Telehealth: Payer: Self-pay

## 2020-09-17 ENCOUNTER — Telehealth: Payer: Self-pay | Admitting: Gastroenterology

## 2020-09-17 DIAGNOSIS — K567 Ileus, unspecified: Principal | ICD-10-CM

## 2020-09-17 DIAGNOSIS — D132 Benign neoplasm of duodenum: Secondary | ICD-10-CM | POA: Insufficient documentation

## 2020-09-17 DIAGNOSIS — K209 Esophagitis, unspecified without bleeding: Secondary | ICD-10-CM | POA: Insufficient documentation

## 2020-09-17 DIAGNOSIS — R634 Abnormal weight loss: Secondary | ICD-10-CM | POA: Insufficient documentation

## 2020-09-17 DIAGNOSIS — R970 Elevated carcinoembryonic antigen [CEA]: Secondary | ICD-10-CM | POA: Insufficient documentation

## 2020-09-17 DIAGNOSIS — Z7901 Long term (current) use of anticoagulants: Secondary | ICD-10-CM | POA: Insufficient documentation

## 2020-09-17 LAB — RENAL FUNCTION PANEL
Albumin: 2.5 g/dL — ABNORMAL LOW (ref 3.5–5.0)
Anion gap: 8 (ref 5–15)
BUN: 21 mg/dL (ref 8–23)
CO2: 25 mmol/L (ref 22–32)
Calcium: 9.7 mg/dL (ref 8.9–10.3)
Chloride: 99 mmol/L (ref 98–111)
Creatinine, Ser: 1.06 mg/dL (ref 0.61–1.24)
GFR, Estimated: >60 mL/min (ref >60)
Glucose, Bld: 128 mg/dL — ABNORMAL HIGH (ref 70–99)
Phosphorus: 2.3 mg/dL — ABNORMAL LOW (ref 2.5–4.6)
Potassium: 4.4 mmol/L (ref 3.5–5.1)
Sodium: 132 mmol/L — ABNORMAL LOW (ref 135–145)

## 2020-09-17 LAB — CBC
HCT: 25.4 % — ABNORMAL LOW (ref 39.0–52.0)
Hemoglobin: 8.4 g/dL — ABNORMAL LOW (ref 13.0–17.0)
MCH: 28.2 pg (ref 26.0–34.0)
MCHC: 33.1 g/dL (ref 30.0–36.0)
MCV: 85.2 fL (ref 80.0–100.0)
Platelets: 322 10*3/uL (ref 150–400)
RBC: 2.98 MIL/uL — ABNORMAL LOW (ref 4.22–5.81)
RDW: 17.1 % — ABNORMAL HIGH (ref 11.5–15.5)
WBC: 5.8 10*3/uL (ref 4.0–10.5)
nRBC: 0 % (ref 0.0–0.2)

## 2020-09-17 LAB — GLUCOSE, CAPILLARY
Glucose-Capillary: 110 mg/dL — ABNORMAL HIGH (ref 70–99)
Glucose-Capillary: 123 mg/dL — ABNORMAL HIGH (ref 70–99)
Glucose-Capillary: 133 mg/dL — ABNORMAL HIGH (ref 70–99)
Glucose-Capillary: 136 mg/dL — ABNORMAL HIGH (ref 70–99)
Glucose-Capillary: 147 mg/dL — ABNORMAL HIGH (ref 70–99)
Glucose-Capillary: 150 mg/dL — ABNORMAL HIGH (ref 70–99)
Glucose-Capillary: 171 mg/dL — ABNORMAL HIGH (ref 70–99)

## 2020-09-17 IMAGING — US US EXTREM LOW VENOUS
1 series · 13 of 24 positions shown · non-contrast
Comparison: None.

CLINICAL DATA: History of right lower extremity DVT



[Series 1: us extrem low venous · 0.06mm/px · 13 of 68 slices shown]
[im 1/68]
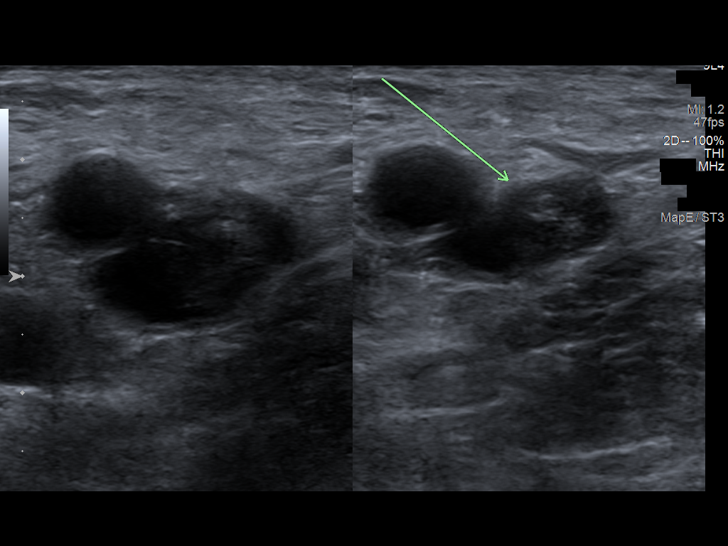
[im 6/68]
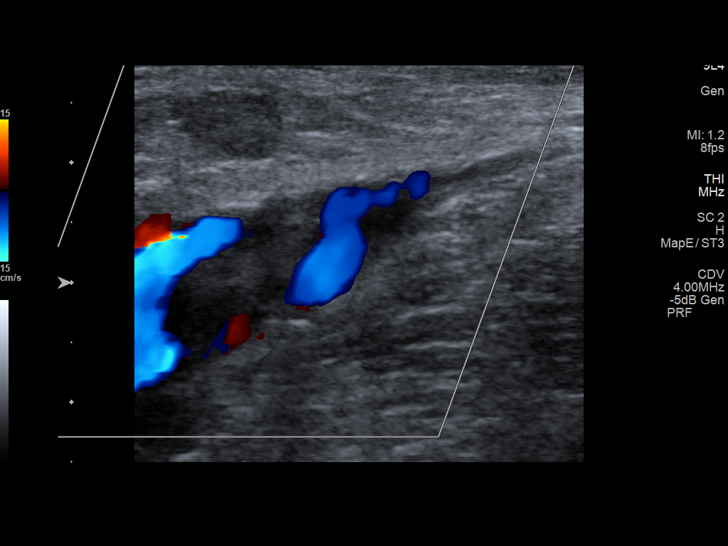
[im 12/68]
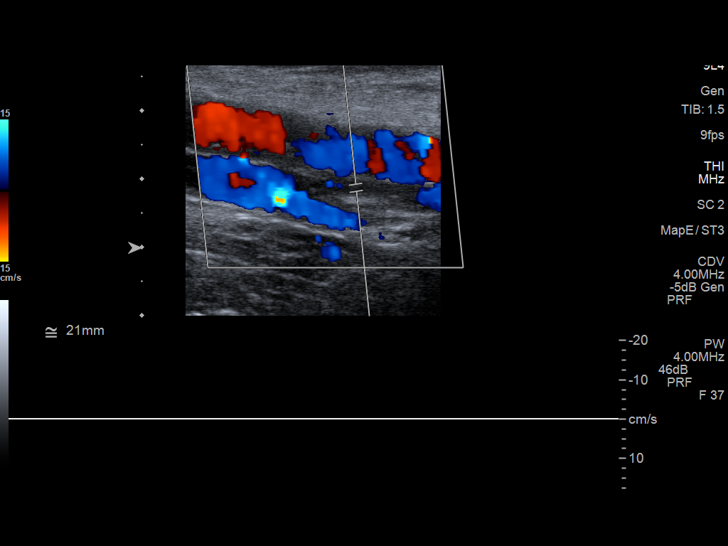
[im 18/68]
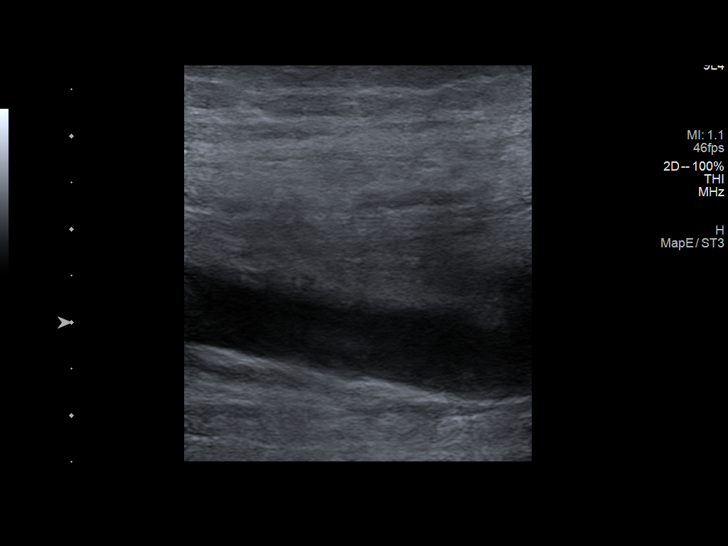
[im 24/68]
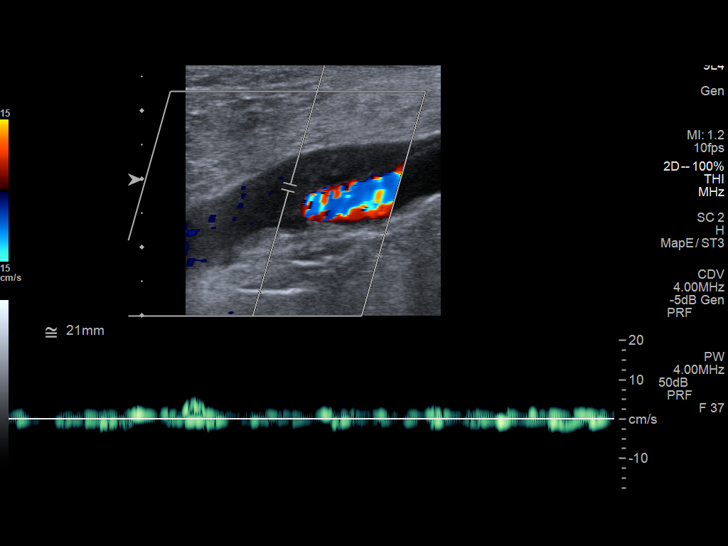
[im 30/68]
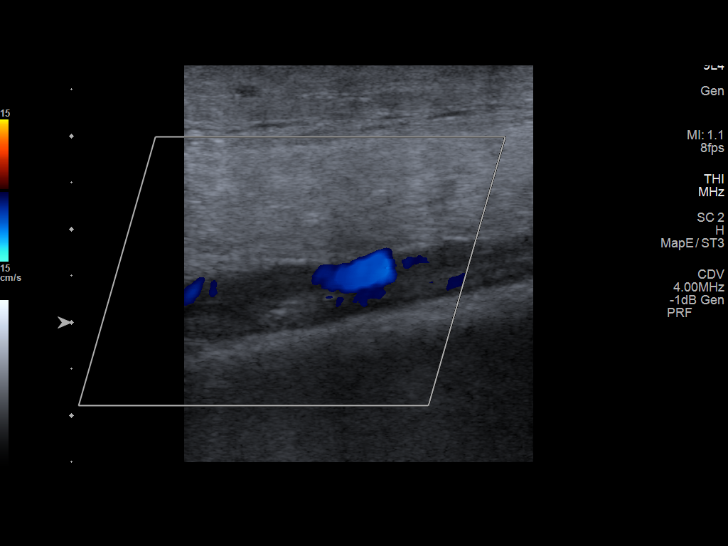
[im 35/68]
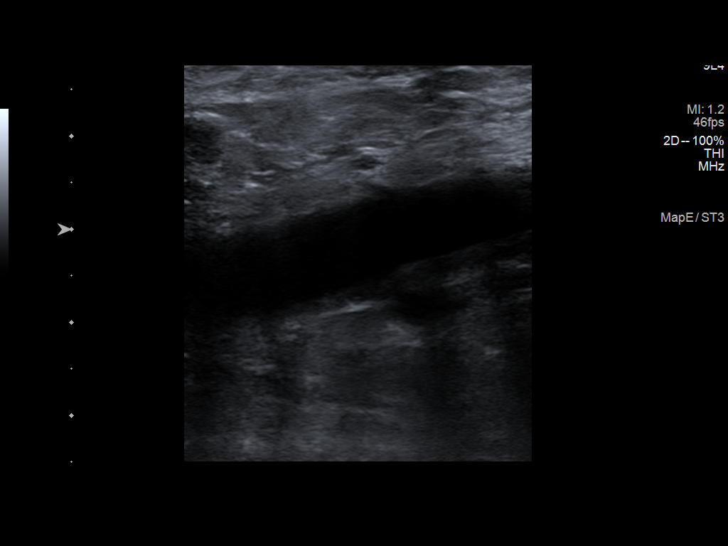
[im 38/68]
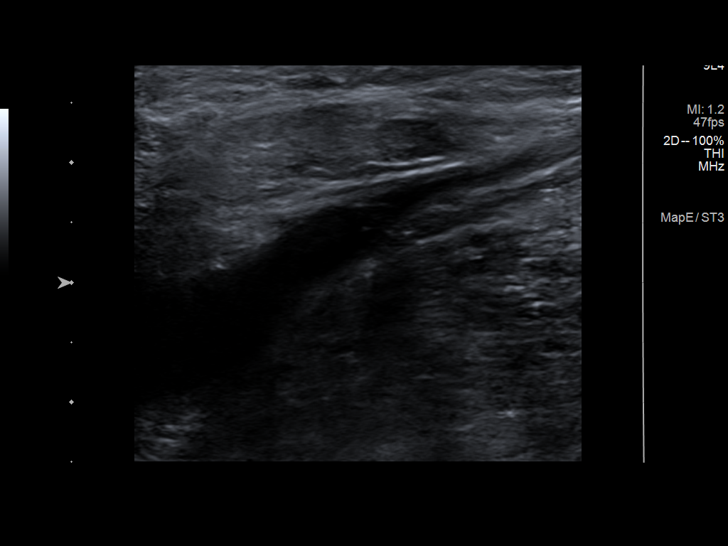
[im 44/68]
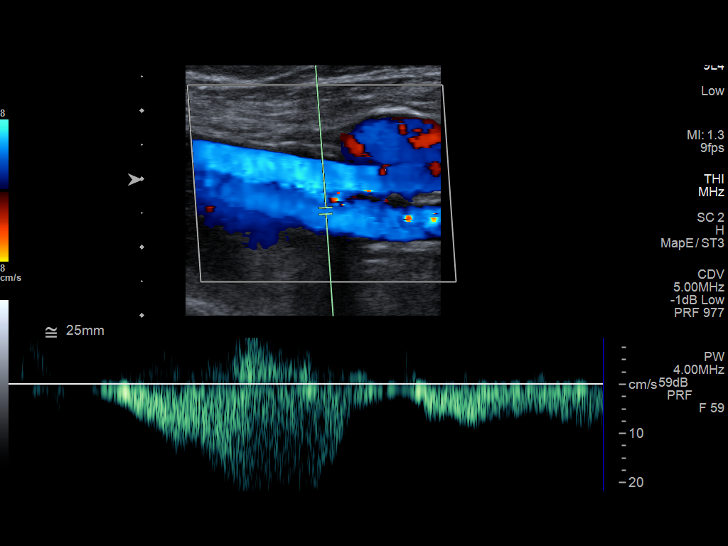
[im 50/68]
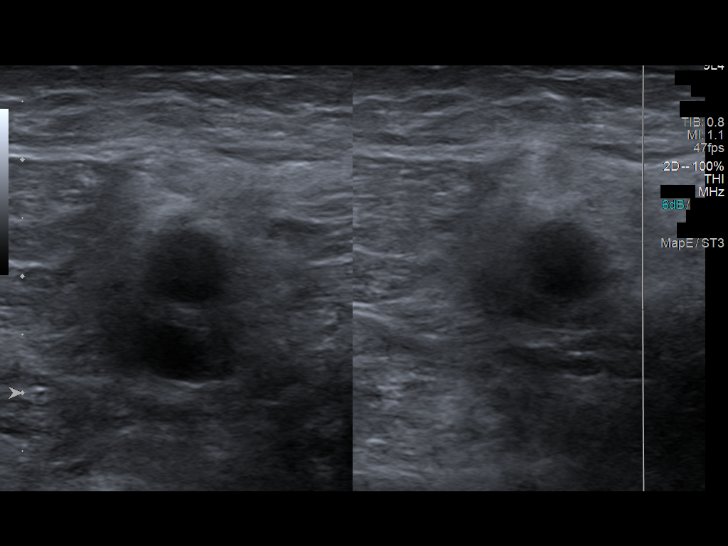
[im 56/68]
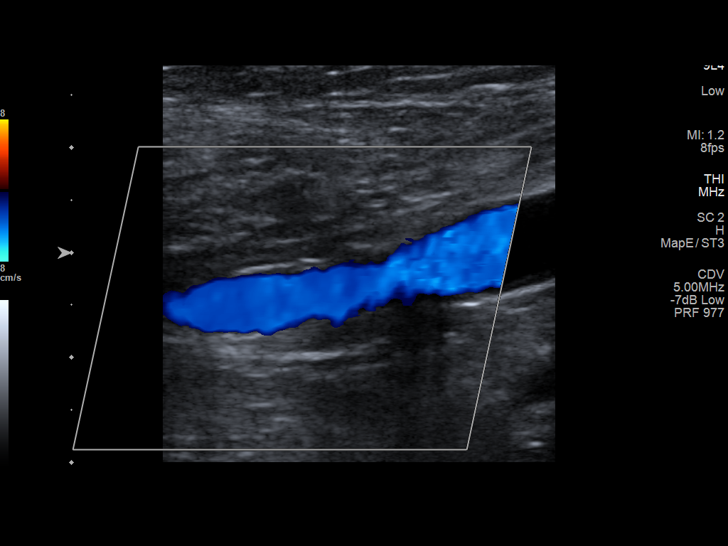
[im 62/68]
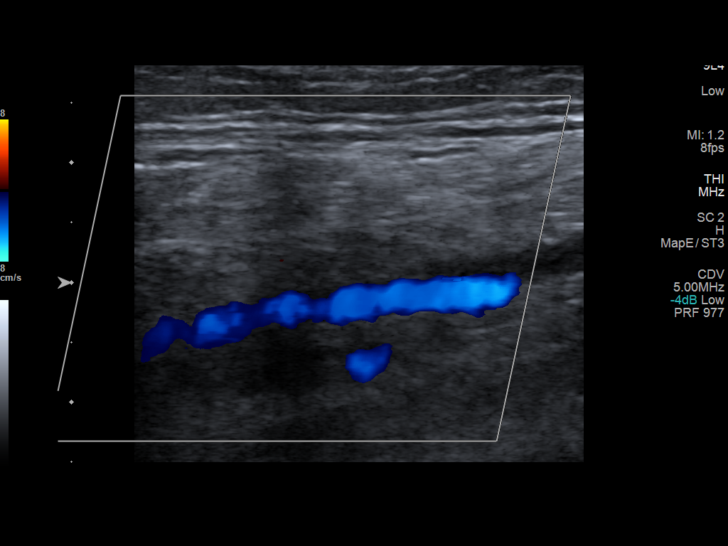
[im 68/68]
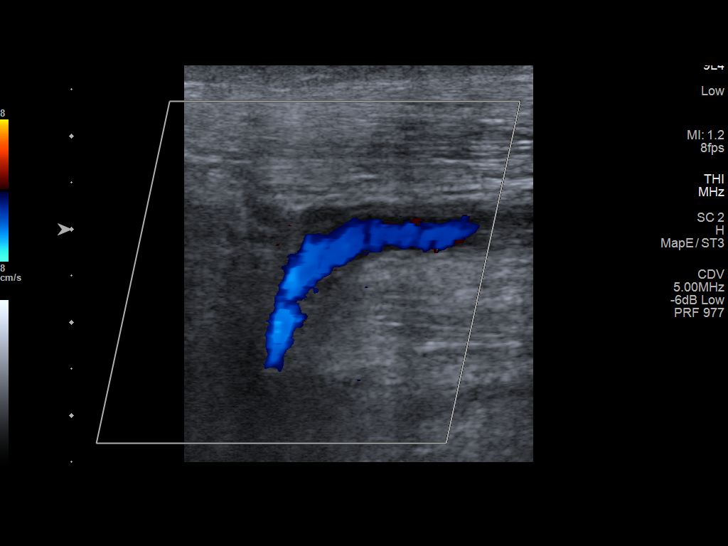

[13 of 24 positions shown; findings below may reference images not displayed]

FINDINGS: RIGHT LOWER EXTREMITY

Common Femoral Vein: Incompletely compressible. Heterogeneous
echogenic material present within the lumen consistent with
nonocclusive DVT. Partial flow visualized on color Doppler imaging.

Saphenofemoral Junction: Nonocclusive thrombus extends into the
saphenofemoral junction.

Profunda Femoral Vein: No evidence of thrombus. Normal
compressibility and flow on color Doppler imaging.

Femoral Vein: Nonocclusive thrombus extends into the femoral vein
throughout the thigh.

Popliteal Vein: Thrombus extends into the popliteal vein where it is
occlusive.

Calf Veins: Occlusive thrombus extends into the peroneal and
posterior tibial veins.

Superficial Great Saphenous Vein: No evidence of thrombus. Normal
compressibility.

Venous Reflux:  None.

Other Findings:  None.

LEFT LOWER EXTREMITY

Common Femoral Vein: No evidence of thrombus. Normal
compressibility, respiratory phasicity and response to augmentation.

Saphenofemoral Junction: No evidence of thrombus. Normal
compressibility and flow on color Doppler imaging.

Profunda Femoral Vein: No evidence of thrombus. Normal
compressibility and flow on color Doppler imaging.

Femoral Vein: No evidence of thrombus. Normal compressibility,
respiratory phasicity and response to augmentation.

Popliteal Vein: No evidence of thrombus. Normal compressibility,
respiratory phasicity and response to augmentation.

Calf Veins: No evidence of thrombus. Normal compressibility and flow
on color Doppler imaging.

Superficial Great Saphenous Vein: No evidence of thrombus. Normal
compressibility.

Venous Reflux:  None.

Other Findings:  None.
IMPRESSION: 1. Occlusive thrombus present within the right calf veins and right
popliteal vein. Nonocclusive thrombus extends throughout the femoral
vein in the thigh and into the common femoral vein. Given reported
history of prior right lower extremity DVT, findings are favored to
represent subacute versus acute on chronic DVT. Recommend
correlation with prior outside imaging if available.
2. No evidence of DVT in the left lower extremity.

## 2020-09-17 NOTE — NC FL2 (Signed)
[place] LEVEL OF CARE SCREENING TOOL     IDENTIFICATION  Patient Name: Daniel [name] Birthdate: 1951/01/23 Sex: male Admission Date (Current Location): 09/15/2020  [place] and [place] Number:  [facility] [phone] [place] and Address:  [facility] [address], [place]      Provider Number: [id number]  Attending Physician Name and Address:  Roxan Hockey, MD  Relative Name and Phone Number:  [name] ([place]) [phone]    Current Level of Care: Hospital Recommended Level of Care: [place] Prior Approval Number:    Date Approved/Denied:   PASRR Number:    Discharge Plan: SNF    Current Diagnoses: Patient Active Problem List   Diagnosis Date Noted  . Elevated CEA 09/17/2020  . 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 Esophagitis determined by endoscopy 09/17/2020  . Unintentional weight loss 09/17/2020  . Chronic anticoagulation 09/17/2020  . Vomiting 09/16/2020  . Leukocytosis 09/16/2020  . Hypoalbuminemia due to protein-calorie malnutrition ([place]) 09/16/2020  . Hyperlipidemia 09/16/2020  . 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 Ileus ([place]) 09/15/2020  . Partial bowel obstruction ([place])   . Chronic constipation   . DVT (deep venous thrombosis) ([place])   . Gastric distention   . Abdominal distention 09/08/2020  . Aspiration pneumonia ([place]) 09/08/2020  . Hypercalcemia 09/08/2020  . Multiple gastric ulcers 07/24/2020  . Esophagitis 07/24/2020  . 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 Normocytic anemia   . Upper abdominal pain   . Abnormal CT scan, esophagus   . Abnormal CT scan, stomach   . Pancreatic abnormality   . Constipation   . Hyperglycemia due to diabetes mellitus ([place])   . History of stroke   . Hypokalemia   . Hypomagnesemia   . AKI (acute kidney injury) ([place]) 06/06/2020  . Acute CVA (cerebrovascular accident) ([place]) 01/05/2020  . Right knee pain 01/01/2017  . Osteoarthritis of right knee  01/01/2017  . Mental impairment 09/23/2016  . Hypertension 09/23/2016  . Type 2 diabetes mellitus without complication, without long-term current use of insulin ([place]) 09/23/2016  . BPH (benign prostatic hyperplasia) 09/23/2016  . Dementia ([place]) 09/23/2016  . 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    Orientation RESPIRATION BLADDER Height & Weight     Self,Place,Time  Normal Incontinent Weight: 146 lb 9.6 oz (66.5 kg) Height:  5\' 8"  (172.7 cm)  BEHAVIORAL SYMPTOMS/MOOD NEUROLOGICAL BOWEL NUTRITION STATUS      Incontinent Diet (see dc summary)  AMBULATORY STATUS COMMUNICATION OF NEEDS Skin   Extensive Assist Verbally Normal                       Personal Care Assistance Level of Assistance    Bathing Assistance: Limited assistance Feeding assistance: Limited assistance Dressing Assistance: Limited assistance     Functional Limitations Info    Sight Info: Impaired Hearing Info: Adequate Speech Info: Adequate    SPECIAL CARE FACTORS FREQUENCY                       Contractures Contractures Info: Not present    Additional Factors Info    Code Status Info: Full Allergies Info: NKA Psychotropic Info: Trazodone         Current Medications (09/17/2020):  This is the current hospital active medication list Current Facility-Administered Medications  Medication Dose Route Frequency Provider Last Rate Last Admin  . 0.9 %  sodium chloride infusion   Intravenous Continuous Rehman, Mechele Dawley, MD 50  mL/hr at 09/16/20 1718 Infusion Verify at 09/16/20 1718  . aMILoride (MIDAMOR) tablet 5 mg  5 mg Oral Daily Emokpae, Courage, MD   5 mg at 09/16/20 2119  . amitriptyline (ELAVIL) tablet 10 mg  10 mg Oral QHS Emokpae, Courage, MD   10 mg at 09/16/20 2111  . atorvastatin (LIPITOR) tablet 40 mg  40 mg Oral QPM Emokpae, Courage, MD   40 mg at 09/16/20 2106  . donepezil (ARICEPT) tablet 5 mg  5 mg Oral QHS Emokpae, Courage, MD   5 mg at 09/16/20 2106  . insulin aspart  (novoLOG) injection 0-9 Units  0-9 Units Subcutaneous Q4H Adefeso, Oladapo, DO   1 Units at 09/17/20 0356  . iopamidol (ISOVUE-300) 61 % injection 80 mL  80 mL Intravenous Once PRN Adefeso, Oladapo, DO      . memantine (NAMENDA) tablet 5 mg  5 mg Oral Daily Emokpae, Courage, MD   5 mg at 09/16/20 2115  . metoCLOPramide (REGLAN) injection 5 mg  5 mg Intravenous TID AC Rehman, Najeeb U, MD      . metoprolol tartrate (LOPRESSOR) tablet 25 mg  25 mg Oral BID Roxan Hockey, MD   25 mg at 09/16/20 2106  . ondansetron (ZOFRAN) injection 4 mg  4 mg Intravenous Q6H PRN Adefeso, Oladapo, DO      . pantoprazole (PROTONIX) 80 mg in sodium chloride 0.9 % 100 mL (0.8 mg/mL) infusion  8 mg/hr Intravenous Continuous Adefeso, Oladapo, DO 10 mL/hr at 09/17/20 0113 8 mg/hr at 09/17/20 0113  . phosphorus (K PHOS NEUTRAL) tablet 250 mg  250 mg Oral TID Roxan Hockey, MD   250 mg at 09/16/20 2106  . polyethylene glycol (MIRALAX / GLYCOLAX) packet 17 g  17 g Oral BID Roxan Hockey, MD   17 g at 09/16/20 2106  . senna-docusate (Senokot-S) tablet 2 tablet  2 tablet Oral QHS Roxan Hockey, MD   2 tablet at 09/16/20 2112     Discharge Medications: Please see discharge summary for a list of discharge medications.  Relevant Imaging Results:  Relevant Lab Results:   Additional Information SSN: [id number]  Shade Flood, LCSW

## 2020-09-17 NOTE — Progress Notes (Addendum)
Patient Demographics:    Daniel [name], is a 70 y.o. male, DOB - [birth year], YQM:[id number]  Admit date - 09/15/2020   Admitting Physician Bernadette Hoit, DO  Outpatient Primary MD for the patient is Leonie Douglas, MD  LOS - 2   Chief Complaint  Patient presents with  . Emesis        Subjective:    Daniel [name] today has no fevers, no further emesis,  No chest pain No fever  Or chills  I called and updated legal guardian [name] at  [phone]   Assessment  & Plan :    Principal Problem:   Ileus [facility]) Active Problems:   Hypertension   BPH (benign prostatic hyperplasia)   Dementia (HCC)   Hyperglycemia due to diabetes mellitus (HCC)   Normocytic anemia   Multiple gastric ulcers   Esophagitis   Duodenal mass   DVT (deep venous thrombosis) (HCC)   Vomiting   Leukocytosis   Hypoalbuminemia due to protein-calorie malnutrition (HCC)   Hyperlipidemia   Pericardial cyst  Brief Summary:- 70 y.o. male with medical history significant for hypertension, T2DM,dementia, esophagitis, gastric ulcers, DVT and duodenal mass admitted from SNF on 09/15/2020 with concerns for hematemesis   A/p  1) hematemesis--- no further vomiting here, GI consult appreciated -IV Protonix as ordered -Monitor H&H and transfuse as needed Hgb is down to 8.4 -GI consult appreciated--  possible repeat EGD on 09/18/20  2)History of severe constipation with recurrent pSBO-now presenting with suspected ileus CT abdomen and pelvis showed  stable dilated stomach and colon, compatible with ileus.  No findings to indicate interval gastric outlet obstruction -Reglan as advised by GI service  3)Severe constipation with recurrent partial bowel obstruction --laxatives as ordered  4)Duodenal mass patient had initial consultation with Dr. Rush Landmark on 09/14/2020 -Plan is for EGD with EUS and duodenal mass biopsy in June 2022 by  Dr. 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 -Palliative care practitioner Ms. 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  12) esophagitis/PUD--- EGD from January 2022 showed distal  Moderately severe reflux esophagitis. Multiple gastric ulcers without bleeding stigmata and associated gastric erosions. -Repeat EGD on 09/18/2020 -IV Protonix as above  Code Status:currently full,  Family Communication:has a legal guardian  Disposition/Need for in-Hospital Stay- patient unable to be discharged at this time due to --possible  GI bleed requiring further observation and possible transfusion--- as well as IV Protonix and IV fluids until oral intake is reliable  Status is: Inpatient  Remains inpatient appropriate because:Please see above   Disposition: The patient is from: SNF              Anticipated d/c is to: SNF               Anticipated d/c date is: 2 days              Patient currently is not medically stable to d/c. Barriers: Not Clinically Stable-   Code Status : -  Code Status: Full Code   Family Communication:   Legal guardian -I called and updated legal guardian [name] at  [phone]  Consults  :  Gi  DVT Prophylaxis  :   - lovenox      Lab Results  Component Value Date   PLT 322 09/17/2020    Inpatient Medications  Scheduled Meds: . aMILoride  5 mg Oral Daily  . amitriptyline  10 mg Oral QHS  . atorvastatin  40 mg Oral QPM  . donepezil  5 mg Oral QHS  . insulin aspart  0-9 Units Subcutaneous Q4H  . memantine  5 mg Oral Daily  . metoCLOPramide (REGLAN) injection  5 mg Intravenous TID AC  . metoprolol tartrate  25 mg Oral BID  . phosphorus  250 mg Oral TID  . polyethylene glycol  17 g Oral BID  . senna-docusate  2 tablet Oral QHS   Continuous Infusions: . sodium chloride 50 mL/hr at 09/16/20 1718  . pantoprozole (PROTONIX) infusion 8 mg/hr (09/17/20 1629)   PRN Meds:.iopamidol, ondansetron (ZOFRAN) IV    Anti-infectives (From admission, onward)   None        Objective:   Vitals:   09/16/20 1200 09/16/20 2106 09/17/20 0414 09/17/20 1346  BP: 126/77 129/72 121/76 130/81  Pulse:  95 81 72  Resp: 15 18 18    Temp:  98.6 F (37 C) 98 F (36.7 C) 98.9 F (37.2 C)  TempSrc:  Oral Oral Oral  SpO2: 93%  97% 99%  Weight:      Height:        Wt Readings from Last 3 Encounters:  09/15/20 66.5 kg  09/09/20 60 kg  08/28/20 68 kg     Intake/Output Summary (Last 24 hours) at 09/17/2020 1722 Last data filed at 09/17/2020 0700 Gross per 24 hour  Intake 1302.04 ml  Output 650 ml  Net 652.04 ml     Physical Exam  Gen:- Awake Alert, no acute distress HEENT:- [place].AT, No sclera icterus Neck-Supple Neck,No JVD,.  Lungs-  CTAB , good air movement bilaterally  CV- S1, S2 normal, regular Abd-  +ve B.Sounds, Abd Soft, No tenderness,    Extremity/Skin:-No significant lower  extremity swelling or tenderness,   good pulses Psych-affect is flat, not very talkative  neuro-generalized weakness, no new focal deficits, no tremors    Data Review:   Micro Results Recent Results (from the past 240 hour(s))  Resp Panel by RT-PCR (Flu A&B, Covid) Nasopharyngeal Swab     Status: None   Collection Time: 09/08/20  1:10 AM   Specimen: Nasopharyngeal Swab; Nasopharyngeal(NP) swabs in vial transport medium  Result Value Ref Range Status   SARS Coronavirus 2 by RT PCR NEGATIVE NEGATIVE Final    Comment: (NOTE) SARS-CoV-2 target nucleic acids are NOT DETECTED.  The SARS-CoV-2 RNA is generally detectable in upper respiratory specimens  during the acute phase of infection. The lowest concentration of SARS-CoV-2 viral copies this assay can detect is 138 copies/mL. A negative result does not preclude SARS-Cov-2 infection and should not be used as the sole basis for treatment or other patient management decisions. A negative result may occur with  improper specimen collection/handling, submission of specimen other than nasopharyngeal swab, presence of viral mutation(s) within the areas targeted by this assay, and inadequate number of viral copies(<138 copies/mL). A negative result must be combined with clinical observations, patient history, and epidemiological information. The expected result is Negative.  Fact Sheet for Patients:  EntrepreneurPulse.com.au  Fact Sheet for Healthcare Providers:  IncredibleEmployment.be  This test is no t yet approved or cleared by the [place] FDA and  has been authorized for detection and/or diagnosis of SARS-CoV-2 by FDA under an Emergency Use Authorization (EUA). This EUA will remain  in effect (meaning this test can be used) for the duration of the COVID-19 declaration under Section 564(b)(1) of the Act, 21 U.S.C.section 360bbb-3(b)(1), unless the authorization is terminated  or revoked sooner.        Influenza A by PCR NEGATIVE NEGATIVE Final   Influenza B by PCR NEGATIVE NEGATIVE Final    Comment: (NOTE) The Xpert Xpress SARS-CoV-2/FLU/RSV plus assay is intended as an aid in the diagnosis of influenza from Nasopharyngeal swab specimens and should not be used as a sole basis for treatment. Nasal washings and aspirates are unacceptable for Xpert Xpress SARS-CoV-2/FLU/RSV testing.  Fact Sheet for Patients: EntrepreneurPulse.com.au  Fact Sheet for Healthcare Providers: IncredibleEmployment.be  This test is not yet approved or cleared by the [place] FDA and has been authorized for detection and/or diagnosis of SARS-CoV-2 by FDA under an Emergency Use Authorization (EUA). This EUA will remain in effect (meaning this test can be used) for the duration of the COVID-19 declaration under Section 564(b)(1) of the Act, 21 U.S.C. section 360bbb-3(b)(1), unless the authorization is terminated or revoked.  Performed at [facility], [address], [place], [place] [address]   MRSA PCR Screening     Status: None   Collection Time: 09/08/20  2:26 AM   Specimen: Nasal Mucosa; Nasopharyngeal  Result Value Ref Range Status   MRSA by PCR NEGATIVE NEGATIVE Final    Comment:        The GeneXpert MRSA Assay (FDA approved for NASAL specimens only), is one component of a comprehensive MRSA colonization surveillance program. It is not intended to diagnose MRSA infection nor to guide or monitor treatment for MRSA infections. Performed at [facility], [address] [place], [place] [address]   Resp Panel by RT-PCR (Flu A&B, Covid) Nasopharyngeal Swab     Status: None   Collection Time: 09/12/20  4:06 PM   Specimen: Nasopharyngeal Swab; Nasopharyngeal(NP) swabs in vial transport medium  Result Value Ref Range Status   SARS Coronavirus 2 by RT PCR NEGATIVE NEGATIVE Final    Comment: (NOTE) SARS-CoV-2 target nucleic acids are NOT  DETECTED.  The SARS-CoV-2 RNA is generally detectable in upper respiratory specimens during the acute phase of infection. The lowest concentration of SARS-CoV-2 viral copies this assay can detect is 138 copies/mL. A negative result does not preclude SARS-Cov-2 infection and should not be used as the sole basis for treatment or other patient management decisions. A negative result may occur with  improper specimen collection/handling, submission of specimen other than nasopharyngeal swab, presence of viral mutation(s) within the areas targeted by this assay, and inadequate number of viral  copies(<138 copies/mL). A negative result must be combined with clinical observations, patient history, and epidemiological information. The expected result is Negative.  Fact Sheet for Patients:  EntrepreneurPulse.com.au  Fact Sheet for Healthcare Providers:  IncredibleEmployment.be  This test is no t yet approved or cleared by the [place] FDA and  has been authorized for detection and/or diagnosis of SARS-CoV-2 by FDA under an Emergency Use Authorization (EUA). This EUA will remain  in effect (meaning this test can be used) for the duration of the COVID-19 declaration under Section 564(b)(1) of the Act, 21 U.S.C.section 360bbb-3(b)(1), unless the authorization is terminated  or revoked sooner.       Influenza A by PCR NEGATIVE NEGATIVE Final   Influenza B by PCR NEGATIVE NEGATIVE Final    Comment: (NOTE) The Xpert Xpress SARS-CoV-2/FLU/RSV plus assay is intended as an aid in the diagnosis of influenza from Nasopharyngeal swab specimens and should not be used as a sole basis for treatment. Nasal washings and aspirates are unacceptable for Xpert Xpress SARS-CoV-2/FLU/RSV testing.  Fact Sheet for Patients: EntrepreneurPulse.com.au  Fact Sheet for Healthcare Providers: IncredibleEmployment.be  This test is not yet  approved or cleared by the [place] FDA and has been authorized for detection and/or diagnosis of SARS-CoV-2 by FDA under an Emergency Use Authorization (EUA). This EUA will remain in effect (meaning this test can be used) for the duration of the COVID-19 declaration under Section 564(b)(1) of the Act, 21 U.S.C. section 360bbb-3(b)(1), unless the authorization is terminated or revoked.  Performed at [facility], [address], [place], [place] [address]   SARS CORONAVIRUS 2 (TAT 6-24 HRS) Nasopharyngeal Nasopharyngeal Swab     Status: None   Collection Time: 09/15/20 11:35 PM   Specimen: Nasopharyngeal Swab  Result Value Ref Range Status   SARS Coronavirus 2 NEGATIVE NEGATIVE Final    Comment: (NOTE) SARS-CoV-2 target nucleic acids are NOT DETECTED.  The SARS-CoV-2 RNA is generally detectable in upper and lower respiratory specimens during the acute phase of infection. Negative results do not preclude SARS-CoV-2 infection, do not rule out co-infections with other pathogens, and should not be used as the sole basis for treatment or other patient management decisions. Negative results must be combined with clinical observations, patient history, and epidemiological information. The expected result is Negative.  Fact Sheet for Patients: SugarRoll.be  Fact Sheet for Healthcare Providers: https://www.woods-mathews.com/  This test is not yet approved or cleared by the [place] FDA and  has been authorized for detection and/or diagnosis of SARS-CoV-2 by FDA under an Emergency Use Authorization (EUA). This EUA will remain  in effect (meaning this test can be used) for the duration of the COVID-19 declaration under Se ction 564(b)(1) of the Act, 21 U.S.C. section 360bbb-3(b)(1), unless the authorization is terminated or revoked sooner.  Performed at [facility], [place] [address] [place], [place] [address]     Radiology  Reports DG Abd 1 View  Result Date: 09/09/2020 CLINICAL DATA:  Gastric distension EXAM: ABDOMEN - 1 VIEW COMPARISON:  September 08, 2020 FINDINGS: Nasogastric tube is seen with its distal tip noted along the expected region of the gastric fundus. Air is seen throughout the large and small bowel with mildly dilated small bowel loops seen within the left lower quadrant. No radio-opaque calculi or other significant radiographic abnormality are seen. 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 IMPRESSION: 1. Nasogastric tube positioning, as described above. 2. Additional findings suggestive of an early partial small bowel obstruction versus ileus.  Electronically Signed   By: Virgina Norfolk M.D.   On: 09/09/2020 18:31   DG Abdomen 1 View  Result Date: 09/08/2020 CLINICAL DATA:  Nasogastric tube placement EXAM: ABDOMEN - 1 VIEW COMPARISON:  09/07/2020 FINDINGS: Nasogastric tube tip and side port project within the stomach. Dilated and gas-filled bowel seen throughout the abdomen. Excreted contrast in the urinary bladder and right renal collecting system. IMPRESSION: Nasogastric tube tip and side port in the stomach. Electronically Signed   By: Ulyses Jarred M.D.   On: 09/08/2020 00:54   CT ABDOMEN PELVIS W CONTRAST  Result Date: 09/15/2020 CLINICAL DATA:  Gastrointestinal bleeding. Projectile vomiting with coffee-ground brown emesis. EXAM: CT ABDOMEN AND PELVIS WITH CONTRAST TECHNIQUE: Multidetector CT imaging of the abdomen and pelvis was performed using the standard protocol following bolus administration of intravenous contrast. CONTRAST:  128mL OMNIPAQUE 300 COMPARISON:  09/07/2020 FINDINGS: Lower chest: Stable 5.6 x 2.4 cm oval area of fluid density adjacent to the posterolateral aspect of the heart on the left on image number 14/6. Diffuse low density distal esophageal wall thickening with improvement. Normal sized heart. Atheromatous coronary artery calcifications. Patchy densities in the right lower lobe with some  consolidation with mild improvement. Patchy densities in the posterior right upper lobe with mild improvement. Small amount of interval dependent patchy density in the left lower lobe. No pleural fluid. Hepatobiliary: No focal liver abnormality is seen. No gallstones, gallbladder wall thickening, or biliary dilatation. Pancreas: Stable normal appearing pancreatic head and proximal neck. 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 Spleen: Normal in size without focal abnormality. Adrenals/Urinary Tract: Normal appearing adrenal glands. Stable right renal cysts and small parenchymal calcification. Stable possible small, severely atrophied left kidney. Unremarkable urinary bladder and right ureter. Stomach/Bowel: Stable dilated stomach and colon. Normal caliber small bowel. No evidence of appendicitis. Vascular/Lymphatic: No significant vascular findings are present. No enlarged abdominal or pelvic lymph nodes. Reproductive: Prostate is unremarkable. Other: Small umbilical hernia containing fat. Musculoskeletal: Stable left hip prosthesis. Lumbar and lower thoracic spine degenerative changes. Stable bilateral L5 pars defects with grade 2 anterolisthesis and vertebral body fusion at the L5-S1 level. IMPRESSION: 1. No acute abnormality. 2. Improving distal esophagitis. 3. Stable dilated stomach and colon, compatible with ileus. No findings to indicate interval gastric outlet obstruction. 4. Mildly improved bilateral lung opacities, compatible with improving pneumonia. 5. Stable 5.6 x 2.4 cm oval area of fluid density adjacent to the posterolateral aspect of the heart on the left, compatible with a pericardial cyst. 6. 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 7. Stable possible small, severely atrophied left kidney. Electronically Signed   By: Claudie Revering M.D.   On: 09/15/2020 21:58   CT ABDOMEN PELVIS W CONTRAST  Result Date:  09/07/2020 CLINICAL DATA:  Nausea and vomiting coffee ground emesis EXAM: CT ABDOMEN AND PELVIS WITH CONTRAST TECHNIQUE: Multidetector CT imaging of the abdomen and pelvis was performed using the standard protocol following bolus administration of intravenous contrast. CONTRAST:  158mL OMNIPAQUE IOHEXOL 300 MG/ML  SOLN COMPARISON:  09/07/2020, CT 08/24/2020, CT 07/27/2020 FINDINGS: Lower chest: Lung bases demonstrate ground-glass density and patchy consolidation in the right lower lobe and posterior right lower lobe. No pleural effusion. Normal cardiac size. Marked circumferential wall thickening of the distal esophagus. Hepatobiliary: No calcified gallstone. No focal hepatic abnormality or biliary dilatation Pancreas: Truncated appearance of pancreas.  No inflammatory change. Spleen: Normal in size without focal abnormality. Adrenals/Urinary Tract: Adrenal glands  are normal. No definitive left renal tissue identified, question very diminutive atrophic left kidney, series 3, image number 41. Small cysts in the right kidney. Subcentimeter hypodense lesions too small to further characterize. The bladder is normal. Stomach/Bowel: Marked fluid distension of the stomach. Some fluid-filled nondistended small bowel in the pelvis. Large volume of stool in the colon. Marked air distension of colon in the right upper quadrant, this appears to be sigmoid colon. Slightly narrowed appearing sigmoid colon that traverses horizontally and extends to right upper quadrant, series 3 image number 55 through 65. Considerable stool distal to the loop of dilated sigmoid colon with large feces in the rectum. No acute bowel wall thickening. No corkscrew appearance of mesentery or vasculature in the region of sigmoid colon narrowing. Similar configuration of the colon on 07/27/2020 but more normal course of sigmoid colon on CT from January of 2022. Vascular/Lymphatic: Nonaneurysmal aorta. Aortic atherosclerosis. No suspicious nodes  Reproductive: Prostate is unremarkable. 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 Left hip arthroplasty with artifact. IMPRESSION: 1. Interval marked circumferential wall thickening of the distal esophagus, probably due to esophagitis. Marked fluid distension of the stomach without evidence for small bowel obstruction, question functional obstruction. Consider NG tube decompression. 2. Generalized distension of the colon with large feces throughout. More focally dilated loop of bowel in the right upper quadrant appears to represent dilated segment of sigmoid colon. Slightly aberrant course of sigmoid colon which appears slightly narrowed at the right lower quadrant with adjacent slightly narrowed segment of rectosigmoid colon, question some degree of colon obstruction, potentially from internal hernia. No corkscrew appearance to suggest a volvulus. Considerable stool within the rectosigmoid colon distal to this suggesting that obstruction may be partial. Note that a similar configuration of the colon was present on CT from 07/27/2020 3. Ground-glass density and patchy consolidation in the right lower lobe and posterior right upper lobe concerning for pneumonia or aspiration. 4. Absent left kidney Aortic Atherosclerosis (ICD10-I70.0). Electronically Signed   By: Donavan Foil M.D.   On: 09/07/2020 22:57   US Venous Img Lower Bilateral (DVT)  Result Date: 09/17/2020 CLINICAL DATA:  History of right lower extremity DVT EXAM: BILATERAL LOWER EXTREMITY VENOUS DOPPLER ULTRASOUND TECHNIQUE: Gray-scale sonography with graded compression, as well as color Doppler and duplex ultrasound were performed to evaluate the lower extremity deep venous systems from the level of the common femoral vein and including the common femoral, femoral, profunda femoral, popliteal and calf veins including the posterior  tibial, peroneal and gastrocnemius veins when visible. The superficial great saphenous vein was also interrogated. Spectral Doppler was utilized to evaluate flow at rest and with distal augmentation maneuvers in the common femoral, femoral and popliteal veins. COMPARISON:  None. FINDINGS: RIGHT LOWER EXTREMITY Common Femoral Vein: Incompletely compressible. Heterogeneous echogenic material present within the lumen consistent with nonocclusive DVT. Partial flow visualized on color Doppler imaging. Saphenofemoral Junction: Nonocclusive thrombus extends into the saphenofemoral junction. Profunda Femoral Vein: No evidence of thrombus. Normal compressibility and flow on color Doppler imaging. Femoral Vein: Nonocclusive thrombus extends into the femoral vein throughout the thigh. Popliteal Vein: Thrombus extends into the popliteal vein where it is occlusive. Calf Veins: Occlusive thrombus extends into the peroneal and posterior tibial veins. Superficial Great Saphenous Vein: No evidence of thrombus. Normal compressibility. Venous Reflux:  None. Other Findings:  None. LEFT LOWER EXTREMITY Common Femoral Vein: No evidence  of thrombus. Normal compressibility, respiratory phasicity and response to augmentation. Saphenofemoral Junction: No evidence of thrombus. Normal compressibility and flow on color Doppler imaging. Profunda Femoral Vein: No evidence of thrombus. Normal compressibility and flow on color Doppler imaging. Femoral Vein: No evidence of thrombus. Normal compressibility, respiratory phasicity and response to augmentation. Popliteal Vein: No evidence of thrombus. Normal compressibility, respiratory phasicity and response to augmentation. Calf Veins: No evidence of thrombus. Normal compressibility and flow on color Doppler imaging. Superficial Great Saphenous Vein: No evidence of thrombus. Normal compressibility. Venous Reflux:  None. Other Findings:  None. IMPRESSION: 1. Occlusive thrombus present within the right  calf veins and right popliteal vein. Nonocclusive thrombus extends throughout the femoral vein in the thigh and into the common femoral vein. Given reported history of prior right lower extremity DVT, findings are favored to represent subacute versus acute on chronic DVT. Recommend correlation with prior outside imaging if available. 2. No evidence of DVT in the left lower extremity. Electronically Signed   By: Jacqulynn Cadet M.D.   On: 09/17/2020 12:33   DG Chest Port 1 View  Result Date: 09/15/2020 CLINICAL DATA:  Coffee-ground emesis EXAM: PORTABLE CHEST 1 VIEW COMPARISON:  07/27/2020 FINDINGS: The heart size and mediastinal contours are within normal limits. Both lungs are clear. Unchanged elevation of the bilateral hemidiaphragms with associated scarring or atelectasis. The visualized skeletal structures are unremarkable. IMPRESSION: No acute abnormality of the lungs in AP portable projection. Electronically Signed   By: Eddie Candle M.D.   On: 09/15/2020 19:14   DG ABD ACUTE 2+V W 1V CHEST  Result Date: 09/07/2020 CLINICAL DATA:  Abdominal distension, emesis and lower extremity swelling EXAM: DG ABDOMEN ACUTE WITH 1 VIEW CHEST COMPARISON:  CT 08/24/2020, MR 06/25/2020, CT 06/19/2020 FINDINGS: There is extensive severe and increasing air distention of what appears to be the colon given some haustra markings although several distended loops of bowel in the low pelvis could reflect some small bowel dilatation as well. A large volume of feculent material is noted within portions of the distended colon as well. No convincing evidence of subdiaphragmatic free air at this time. No suspicious abdominal calcifications. Lung volumes are markedly diminished. Some streaky and patchy opacities in the lung bases may reflect atelectasis, airspace disease or sequela of aspiration in the provided clinical context. Cardiomediastinal contours are unremarkable for the portable upright technique. No pneumothorax. No  layering pleural effusion. Telemetry leads overlie the chest and abdomen with nasal cannula the base of the neck. Multilevel degenerative changes in the shoulders, spine, hips and pelvis with a prior left hip arthroplasty. IMPRESSION: Severe and increasing air distention of what appears to be the colon given some haustra markings although several distended loops of bowel in the low pelvis could reflect some small bowel dilatation as well. Large volume of feculent material within portions of the distended colon as well. Appearance could reflect a colonic obstruction or pseudo-obstruction versus ileus. Electronically Signed   By: Lovena Le M.D.   On: 09/07/2020 20:58   DG Abd Portable 2V  Result Date: 09/10/2020 CLINICAL DATA:  Chronic constipation and bowel obstruction EXAM: PORTABLE ABDOMEN - 2 VIEW COMPARISON:  September 09, 2020 FINDINGS: Supine and upright images obtained. Nasogastric tube tip and side port in stomach. There is moderate stool in the colon. There is mild transverse colonic dilatation with several loops of small bowel upper normal in size. No air-fluid levels. No free air evident. Atelectatic change noted in right lung base. IMPRESSION: Nasogastric tube  tip and side port in stomach. Moderate stool in colon. Borderline prominence of bowel loops potentially could indicate enteritis or ileus. Bowel obstruction not felt to be likely. No free air. Atelectatic change right lung base. Electronically Signed   By: Lowella Grip III M.D.   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 Follow-up study. EXAM: CT ABDOMEN WITHOUT AND WITH CONTRAST TECHNIQUE: Multidetector CT imaging of the abdomen was performed following the standard protocol before and following the bolus administration of intravenous contrast. CONTRAST:  122mL OMNIPAQUE IOHEXOL 300 MG/ML  SOLN COMPARISON:   CT the abdomen and pelvis 06/19/2020. MRI of the abdomen with MRCP 06/25/2020. FINDINGS: Lower chest: Areas of subsegmental atelectasis or scarring are noted in the right lung base. Hepatobiliary: No suspicious cystic or solid hepatic lesions. No intra or extrahepatic biliary ductal dilatation. Gallbladder is normal in appearance. Pancreas: No pancreatic mass. No pancreatic ductal dilatation. No pancreatic or peripancreatic fluid collections or inflammatory changes. 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 Spleen: Unremarkable. 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 No surgical clips are noted in the left retroperitoneum. Multiple subcentimeter low-attenuation lesions in the right kidney, too small to definitively characterize, but likely to represent cysts based on their appearance on prior abdominal MRI. Small nonobstructive calculi are present within the right renal collecting system measuring up to 5 mm in the interpolar region. No suspicious right renal lesions. No right hydroureteronephrosis in the visualized portions of the abdomen. Bilateral adrenal glands are normal in appearance. Stomach/Bowel: Normal appearance of the stomach. No pathologic dilatation of the visualized portions of small bowel or colon. Vascular/Lymphatic: No aneurysm identified in the visualized abdominal vasculature. No lymphadenopathy noted in the abdomen. Other: Small umbilical hernia containing only omental fat. No significant volume of ascites and no pneumoperitoneum noted in the visualized portions of the peritoneal cavity. Musculoskeletal: No aggressive appearing osseous lesions are noted in the visualized portions of the skeleton. Status post left hip arthroplasty. IMPRESSION: 1. No pancreatic ductal dilatation. 2. No acute findings in the abdomen. 3. Incidental findings, as above. Electronically Signed   By: Vinnie Langton M.D.   On:  08/25/2020 06:34     CBC Recent Labs  Lab 09/11/20 0425 09/15/20 1850 09/16/20 0555 09/17/20 0538  WBC 5.1 13.3* 12.2* 5.8  HGB 8.3* 11.7* 9.6* 8.4*  HCT 25.7* 36.6* 29.7* 25.4*  PLT 329 399 328 322  MCV 84.8 84.9 85.8 85.2  MCH 27.4 27.1 27.7 28.2  MCHC 32.3 32.0 32.3 33.1  RDW 16.6* 16.5* 17.0* 17.1*  LYMPHSABS  --  0.6*  --   --   MONOABS  --  0.7  --   --   EOSABS  --  0.0  --   --   BASOSABS  --  0.0  --   --     Chemistries  Recent Labs  Lab 09/11/20 0425 09/12/20 0529 09/15/20 1850 09/16/20 0555 09/17/20 0538  NA 140 135 135 135 132*  K 3.3* 3.5 4.8 4.1 4.4  CL 100 96* 92* 99 99  CO2 31 27 29 26 25   GLUCOSE 171* 173* 221* 122* 128*  BUN 17 9 25* 24* 21  CREATININE 1.00 0.82 1.16 1.16 1.06  CALCIUM 9.5 9.3 11.6* 10.3 9.7  MG 1.5* 1.7  --  1.6*  --  AST 9* 18 24 15   --   ALT 17 24 34 25  --   ALKPHOS 74 94 102 82  --   BILITOT 0.6 0.4 0.5 0.7  --    ------------------------------------------------------------------------------------------------------------------ No results for input(s): CHOL, HDL, LDLCALC, TRIG, CHOLHDL, LDLDIRECT in the last 72 hours.  Lab Results  Component Value Date   HGBA1C 7.1 (H) 09/08/2020   ------------------------------------------------------------------------------------------------------------------ No results for input(s): TSH, T4TOTAL, T3FREE, THYROIDAB in the last 72 hours.  Invalid input(s): FREET3 ------------------------------------------------------------------------------------------------------------------ No results for input(s): VITAMINB12, FOLATE, FERRITIN, TIBC, IRON, RETICCTPCT in the last 72 hours.  Coagulation profile Recent Labs  Lab 09/15/20 1850 09/16/20 0555  INR 1.1 1.2    No results for input(s): DDIMER in the last 72 hours.  Cardiac Enzymes No results for input(s): CKMB, TROPONINI, MYOGLOBIN in the last 168 hours.  Invalid input(s):  CK ------------------------------------------------------------------------------------------------------------------ No results found for: BNP   Roxan Hockey M.D on 09/17/2020 at 5:22 PM  Go to [email or web address] - for contact info  [facility] - Office  [phone]

## 2020-09-17 NOTE — Telephone Encounter (Signed)
Pt last seen in the office please send to the [facility].  Thank you

## 2020-09-17 NOTE — Telephone Encounter (Signed)
Clearance letter to hold Eliquis x2 days prior to [facility] faxed over to [place] at [phone].

## 2020-09-17 NOTE — TOC Initial Note (Signed)
Transition of Care [facility]) - Initial/Assessment Note    Patient Details  Name: Daniel [name] MRN: [id number] Date of Birth: 06/25/1950  [facility]) CM/SW Contact:    Daniel Flood, LCSW Phone Number: 09/17/2020, 10:56 AM  Clinical Narrative:                 Pt admitted due to GI bleed. Pt is long term resident at [facility]. Pt has a legal guardian, Daniel [name]. Plan is to return to [facility] when medically stable. TOC will continue to follow.                    Expected Discharge Plan: Long Term Nursing Home Barriers to Discharge: Continued Medical Work up   Patient Goals and CMS Choice        Expected Discharge Plan and Services Expected Discharge Plan: Long Term Nursing Home In-house Referral: Clinical Social Work   Post Acute Care Choice: Resumption of Svcs/PTA Provider Living arrangements for the past 2 months: [place]                                      Prior Living Arrangements/Services Living arrangements for the past 2 months: [place] Lives with:: Facility Resident Patient language and need for interpreter reviewed:: Yes Do you feel safe going back to the place where you live?: Yes      Need for Family Participation in Patient Care: No (Comment) Care giver support system in place?: Yes (comment)   Criminal Activity/Legal Involvement Pertinent to Current Situation/Hospitalization: No - Comment as needed  Activities of Daily Living Home Assistive Devices/Equipment: CBG Meter,Cane (specify quad or straight),Eyeglasses ADL Screening (condition at time of admission) Patient's cognitive ability adequate to safely complete daily activities?: No Is the patient deaf or have difficulty hearing?: No Does the patient have difficulty seeing, even when wearing glasses/contacts?: Yes Does the patient have difficulty concentrating, remembering, or making decisions?: Yes Patient able to express need for  assistance with ADLs?: No Does the patient have difficulty dressing or bathing?: Yes Independently performs ADLs?: No Communication: Needs assistance Is this a change from baseline?: Pre-admission baseline Dressing (OT): Needs assistance Is this a change from baseline?: Pre-admission baseline Grooming: Needs assistance Is this a change from baseline?: Pre-admission baseline Feeding: Needs assistance Is this a change from baseline?: Pre-admission baseline Bathing: Needs assistance Is this a change from baseline?: Pre-admission baseline Toileting: Needs assistance Is this a change from baseline?: Pre-admission baseline In/Out Bed: Needs assistance Is this a change from baseline?: Pre-admission baseline Walks in Home: Needs assistance Is this a change from baseline?: Pre-admission baseline Does the patient have difficulty walking or climbing stairs?: Yes Weakness of Legs: Left Weakness of Arms/Hands: Left  Permission Sought/Granted                  Emotional Assessment       Orientation: : Oriented to Self,Oriented to Place Alcohol / Substance Use: Not Applicable Psych Involvement: No (comment)  Admission diagnosis:  Ileus ([place]) [K56.7] Vomiting [R11.10] Ileus of unspecified type [facility]) [K56.7] Patient Active Problem List   Diagnosis Date Noted  . Elevated CEA 09/17/2020  . 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 Esophagitis determined by endoscopy 09/17/2020  . Unintentional weight loss 09/17/2020  . Chronic anticoagulation 09/17/2020  . Vomiting 09/16/2020  . Leukocytosis 09/16/2020  . Hypoalbuminemia due to protein-calorie  malnutrition ([place]) 09/16/2020  . Hyperlipidemia 09/16/2020  . 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 Ileus ([place]) 09/15/2020  . Partial bowel obstruction ([place])   . Chronic constipation   . DVT (deep venous thrombosis) ([place])   . Gastric distention   . Abdominal distention 09/08/2020  . Aspiration pneumonia ([place]) 09/08/2020  . Hypercalcemia  09/08/2020  . Multiple gastric ulcers 07/24/2020  . Esophagitis 07/24/2020  . 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 Normocytic anemia   . Upper abdominal pain   . Abnormal CT scan, esophagus   . Abnormal CT scan, stomach   . Pancreatic abnormality   . Constipation   . Hyperglycemia due to diabetes mellitus ([place])   . History of stroke   . Hypokalemia   . Hypomagnesemia   . AKI (acute kidney injury) ([place]) 06/06/2020  . Acute CVA (cerebrovascular accident) ([place]) 01/05/2020  . Right knee pain 01/01/2017  . Osteoarthritis of right knee 01/01/2017  . Mental impairment 09/23/2016  . Hypertension 09/23/2016  . Type 2 diabetes mellitus without complication, without long-term current use of insulin ([place]) 09/23/2016  . BPH (benign prostatic hyperplasia) 09/23/2016  . Dementia ([place]) 09/23/2016  . 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 [place] [address] Phone: [phone] Fax: [phone]     Social Determinants of Health (SDOH) Interventions    Readmission Risk Interventions Readmission Risk Prevention Plan 09/17/2020 09/10/2020 06/21/2020  Transportation Screening Complete Complete Complete  PCP or Specialist Appt within 5-7 Days - - -  Home Care Screening - - -  Medication Review (RN CM) - - -  [place] or [place] - Complete Complete  Social Work Consult for [place] Planning/Counseling - Complete Complete  Palliative Care Screening - - Not Applicable  Medication Review Press photographer) Complete Complete Complete  SW Recovery Care/Counseling Consult Complete - -  [place] Complete - -  Some recent data might be hidden

## 2020-09-17 NOTE — Telephone Encounter (Signed)
Recd call from [name]- she called with concerns because pt has now be admitted to hospital for coffee ground emesis x2  and HGB of 8.4. [name] has some concerns regarding incorrect information that has been documented by hospital. Pt is scheduled for inpt EGD on tomorrow 09/18/20. [name] wanted [place] to be informed and possibly a call back with your thought. [name] call back number is [phone]

## 2020-09-17 NOTE — Progress Notes (Addendum)
Subjective: No abdominal pain, N/V. Large runny/soft green stool between legs, nursing staff noted. No further hematemesis. Clear liquids for breakfast and tolerated well.   Objective: Vital signs in last 24 hours: Temp:  [98 F (36.7 C)-98.6 F (37 C)] 98 F (36.7 C) (04/11 0414) Pulse Rate:  [81-99] 81 (04/11 0414) Resp:  [14-21] 18 (04/11 0414) BP: (94-129)/(63-77) 121/76 (04/11 0414) SpO2:  [92 %-97 %] 97 % (04/11 0414) Last BM Date:  (patient does not recall) General:   Alert and oriented to person and city only.  pleasant Head:  Normocephalic and atraumatic. Abdomen:  Bowel sounds present, round but soft, non-tender, non-distended. No HSM or hernias noted. No rebound or guarding.  Neurologic:  Alert and  oriented to person and city only. Psych:  Flat affect  Intake/Output from previous day: 04/10 0701 - 04/11 0700 In: 1404.9 [P.O.:600; I.V.:804.9] Out: 650 [Urine:650] Intake/Output this shift: No intake/output data recorded.  Lab Results: Recent Labs    09/15/20 1850 09/16/20 0555 09/17/20 0538  WBC 13.3* 12.2* 5.8  HGB 11.7* 9.6* 8.4*  HCT 36.6* 29.7* 25.4*  PLT 399 328 322   BMET Recent Labs    09/15/20 1850 09/16/20 0555 09/17/20 0538  NA 135 135 132*  K 4.8 4.1 4.4  CL 92* 99 99  CO2 29 26 25   GLUCOSE 221* 122* 128*  BUN 25* 24* 21  CREATININE 1.16 1.16 1.06  CALCIUM 11.6* 10.3 9.7   LFT Recent Labs    09/15/20 1850 09/16/20 0555 09/17/20 0538  PROT 7.1 5.8*  --   ALBUMIN 3.3* 2.7* 2.5*  AST 24 15  --   ALT 34 25  --   ALKPHOS 102 82  --   BILITOT 0.5 0.7  --    PT/INR Recent Labs    09/15/20 1850 09/16/20 0555  LABPROT 13.8 15.1  INR 1.1 1.2     Studies/Results: CT ABDOMEN PELVIS W CONTRAST  Result Date: 09/15/2020 CLINICAL DATA:  Gastrointestinal bleeding. Projectile vomiting with coffee-ground brown emesis. EXAM: CT ABDOMEN AND PELVIS WITH CONTRAST TECHNIQUE: Multidetector CT imaging of the abdomen and pelvis was  performed using the standard protocol following bolus administration of intravenous contrast. CONTRAST:  129mL OMNIPAQUE 300 COMPARISON:  09/07/2020 FINDINGS: Lower chest: Stable 5.6 x 2.4 cm oval area of fluid density adjacent to the posterolateral aspect of the heart on the left on image number 14/6. Diffuse low density distal esophageal wall thickening with improvement. Normal sized heart. Atheromatous coronary artery calcifications. Patchy densities in the right lower lobe with some consolidation with mild improvement. Patchy densities in the posterior right upper lobe with mild improvement. Small amount of interval dependent patchy density in the left lower lobe. No pleural fluid. Hepatobiliary: No focal liver abnormality is seen. No gallstones, gallbladder wall thickening, or biliary dilatation. Pancreas: Stable normal appearing pancreatic head and proximal neck. 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 Spleen: Normal in size without focal abnormality. Adrenals/Urinary Tract: Normal appearing adrenal glands. Stable right renal cysts and small parenchymal calcification. Stable possible small, severely atrophied left kidney. Unremarkable urinary bladder and right ureter. Stomach/Bowel: Stable dilated stomach and colon. Normal caliber small bowel. No evidence of appendicitis. Vascular/Lymphatic: No significant vascular findings are present. No enlarged abdominal or pelvic lymph nodes. Reproductive: Prostate is unremarkable. Other: Small umbilical hernia containing fat. Musculoskeletal: Stable left hip prosthesis. Lumbar and lower thoracic spine degenerative changes. Stable bilateral L5 pars defects with grade 2 anterolisthesis and vertebral body fusion at the  L5-S1 level. IMPRESSION: 1. No acute abnormality. 2. Improving distal esophagitis. 3. Stable dilated stomach and colon, compatible with ileus. No findings to indicate interval gastric outlet obstruction. 4. Mildly improved bilateral lung  opacities, compatible with improving pneumonia. 5. Stable 5.6 x 2.4 cm oval area of fluid density adjacent to the posterolateral aspect of the heart on the left, compatible with a pericardial cyst. 6. 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 7. Stable possible small, severely atrophied left kidney. Electronically Signed   By: Claudie Revering M.D.   On: 09/15/2020 21:58   DG Chest Port 1 View  Result Date: 09/15/2020 CLINICAL DATA:  Coffee-ground emesis EXAM: PORTABLE CHEST 1 VIEW COMPARISON:  07/27/2020 FINDINGS: The heart size and mediastinal contours are within normal limits. Both lungs are clear. Unchanged elevation of the bilateral hemidiaphragms with associated scarring or atelectasis. The visualized skeletal structures are unremarkable. IMPRESSION: No acute abnormality of the lungs in AP portable projection. Electronically Signed   By: Eddie Candle M.D.   On: 09/15/2020 19:14    Assessment: 70 year old male with history of  diabetes, hyperlipidemia, hypertension, cognitive impairment, history of duodenal adenoma, gastric ulcers (EGD Jan 2022),CVA, DVT early April 2022, recently inpatient with functional obstruction and subsequently discharged, now presenting with hematemesis and suspected ileus. CT abd/pelvis with contrast showed improving distal esophagitis, stable dilated stomach and colon compatible with ileus, no gastric outlet obstruction.   Hematemesis: now resolved. Known history of reflux esophagitis, gastric ulcers, which would be most likely sources; also unable to rule out bleeding from duodenal adenoma, MW tear. Concern for underlying gastroparesis in light of diabetes and limited physical activity. No further overt GI bleeding. Hgb 11.7 on admission, down to 8.4 this morning, which is similar to a week ago. He has already eaten clear liquids this morning. EGD and EMR planned by Dr. Rush Landmark in June 2022. Colonoscopy  also to be completed at that time. Discussed with Dr. Denton Brick regarding need for anticoagulation. In order to resume safely, requesting EGD if possible prior to discharge.   Ileus: abdominal exam benign. No gastric outlet obstruction., Large runny BM today at time of evaluation.   N/V: continue Reglan for now in light of suspected gastroparesis and monitor for any extrapyramidal side effects.   Right DVT:  Eliquis on hold. Repeat doppler ordered per hospitalist.   Duodenal mass: keep plans for EGD and endoscopic mucosal resection upcoming in June 2022.   Reportedly history of elevated CEA: Colonoscopy in June 2022.  Plan: Can continue PPI infusion for now, anticipate switching to BID shortly Reglan before meals: monitor for side effects Continue Miralax and Senna EGD, endoscopic mucosal resection, and colonoscopy in June 2022 with Dr. Rush Landmark Will make NPO after midnight and tentatively plan on EGD tomorrow, 4/12 Follow H/H Advance to full liquids   Annitta Needs, PhD, ANP-BC [facility]    LOS: 2 days    09/17/2020, 8:58 AM

## 2020-09-17 NOTE — Progress Notes (Signed)
Talked with Legal [place] with [facility] and updated her on patient and the plan for him per MD's notes.

## 2020-09-18 ENCOUNTER — Inpatient Hospital Stay (HOSPITAL_COMMUNITY): Payer: Medicare Other | Admitting: Anesthesiology

## 2020-09-18 ENCOUNTER — Encounter (HOSPITAL_COMMUNITY)
Admission: EM | Disposition: A | Discharge status: Skilled Nursing Facility | Payer: Self-pay | Source: Skilled Nursing Facility | Attending: Family Medicine

## 2020-09-18 ENCOUNTER — Encounter (HOSPITAL_COMMUNITY): Payer: Self-pay | Admitting: Internal Medicine

## 2020-09-18 ENCOUNTER — Inpatient Hospital Stay (HOSPITAL_COMMUNITY): Payer: Medicare Other

## 2020-09-18 DIAGNOSIS — K920 Hematemesis: Secondary | ICD-10-CM

## 2020-09-18 DIAGNOSIS — R935 Abnormal findings on diagnostic imaging of other abdominal regions, including retroperitoneum: Secondary | ICD-10-CM | POA: Insufficient documentation

## 2020-09-18 HISTORY — PX: BIOPSY: SHX5522

## 2020-09-18 HISTORY — PX: ESOPHAGOGASTRODUODENOSCOPY (EGD) WITH PROPOFOL: SHX5813

## 2020-09-18 LAB — CBC
HCT: 29.5 % — ABNORMAL LOW (ref 39.0–52.0)
Hemoglobin: 9.5 g/dL — ABNORMAL LOW (ref 13.0–17.0)
MCH: 27.2 pg (ref 26.0–34.0)
MCHC: 32.2 g/dL (ref 30.0–36.0)
MCV: 84.5 fL (ref 80.0–100.0)
Platelets: 319 10*3/uL (ref 150–400)
RBC: 3.49 MIL/uL — ABNORMAL LOW (ref 4.22–5.81)
RDW: 17 % — ABNORMAL HIGH (ref 11.5–15.5)
WBC: 5.1 10*3/uL (ref 4.0–10.5)
nRBC: 0 % (ref 0.0–0.2)

## 2020-09-18 LAB — GLUCOSE, CAPILLARY
Glucose-Capillary: 105 mg/dL — ABNORMAL HIGH (ref 70–99)
Glucose-Capillary: 106 mg/dL — ABNORMAL HIGH (ref 70–99)
Glucose-Capillary: 128 mg/dL — ABNORMAL HIGH (ref 70–99)
Glucose-Capillary: 129 mg/dL — ABNORMAL HIGH (ref 70–99)
Glucose-Capillary: 130 mg/dL — ABNORMAL HIGH (ref 70–99)
Glucose-Capillary: 141 mg/dL — ABNORMAL HIGH (ref 70–99)
Glucose-Capillary: 214 mg/dL — ABNORMAL HIGH (ref 70–99)

## 2020-09-18 IMAGING — CT CT CHEST W/ CM
2 of 3 series · 14 of 36 positions shown, 17 images · IV contrast (Omnipaque or Isovue)
Comparison: Abdominopelvic CT 3 days ago

CLINICAL DATA: Abnormal gastric shape.  Possible hernia.

Vomiting.
EXAM:
CT CHEST WITH CONTRAST
TECHNIQUE: Multidetector CT imaging of the chest was performed during
intravenous contrast administration.
CONTRAST:  75mL OMNIPAQUE IOHEXOL 300 MG/ML  SOLN

[Series 2: routine chest with · axial · 0.69mm/px · z∈[+922,+1134]mm · 11 of 126 slices shown, 14 images]
[im 10/126  mediastinal]
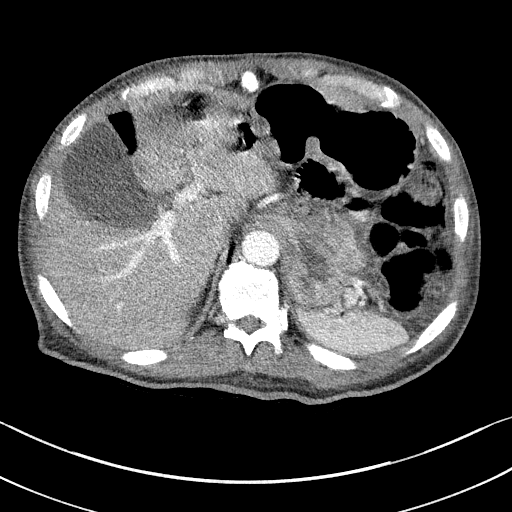
[im 10/126  lung]
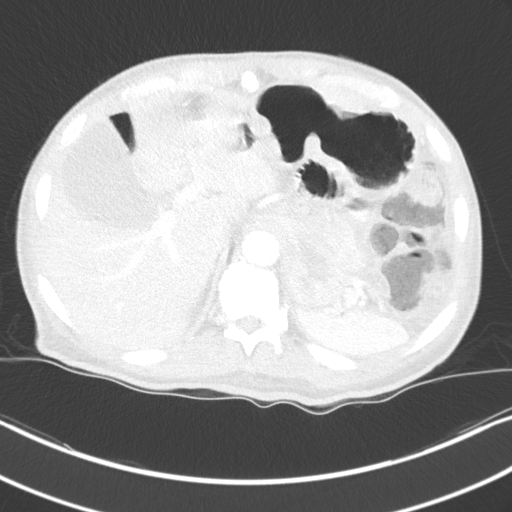
[im 19/126  lung]
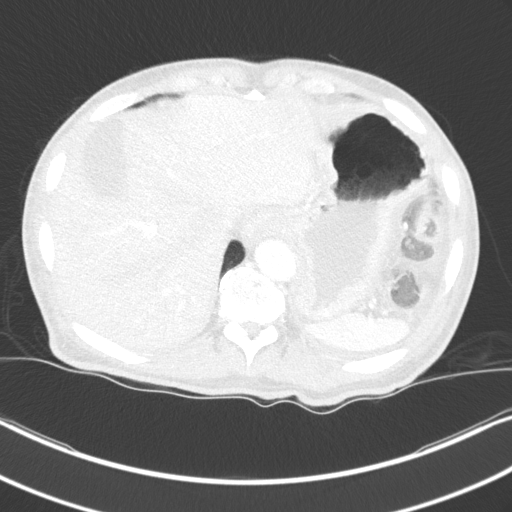
[im 28/126  lung]
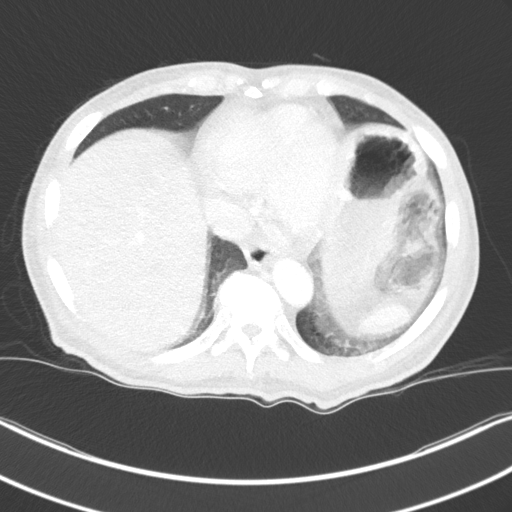
[im 42/126  lung]
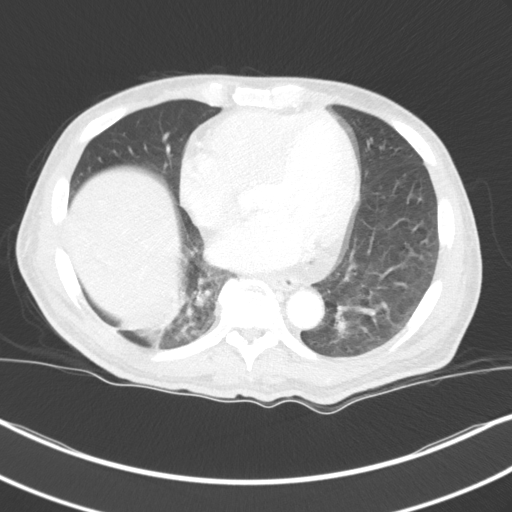
[im 51/126  mediastinal]
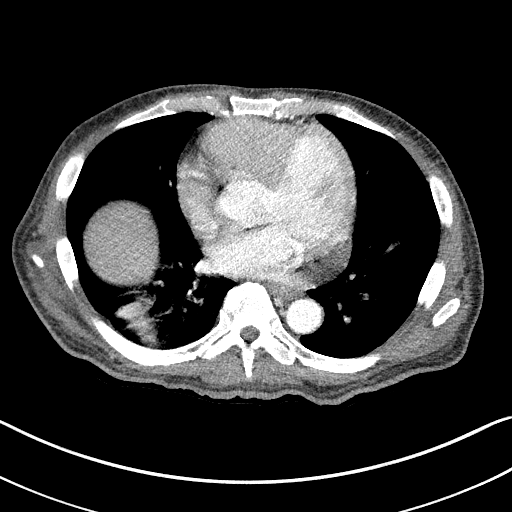
[im 51/126  lung]
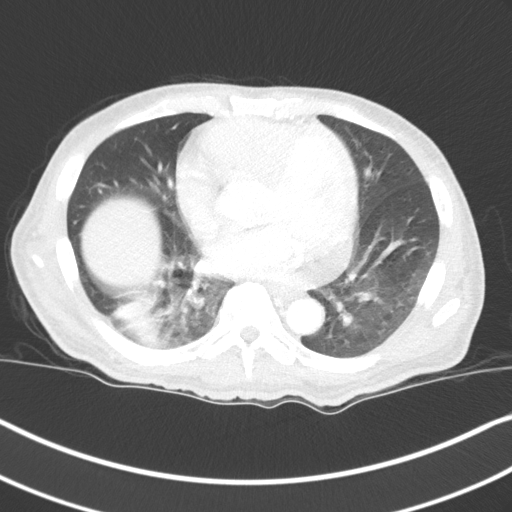
[im 65/126  lung]
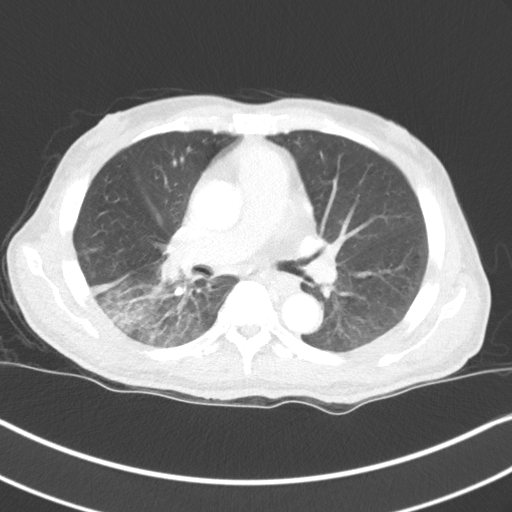
[im 75/126  lung]
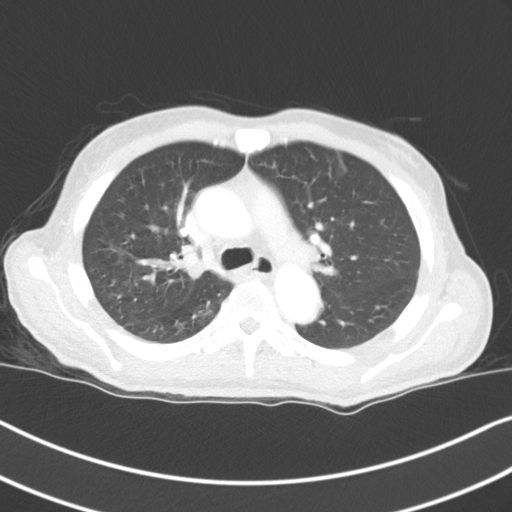
[im 84/126  lung]
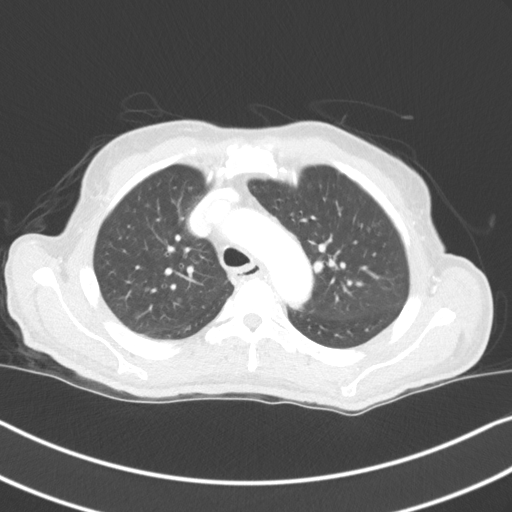
[im 98/126  mediastinal]
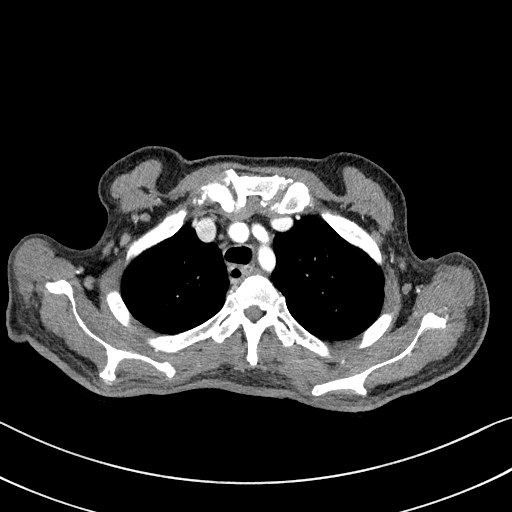
[im 98/126  lung]
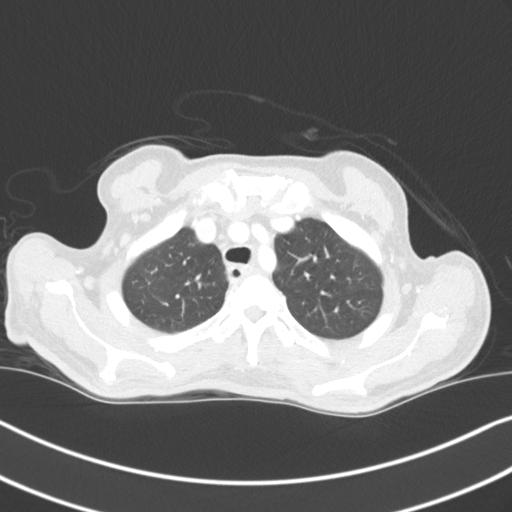
[im 107/126  lung]
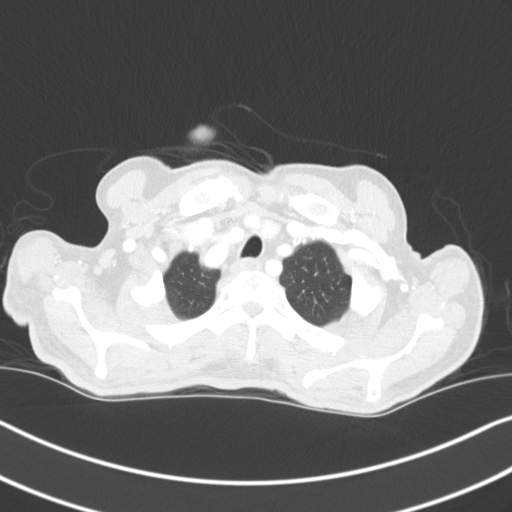
[im 116/126  lung]
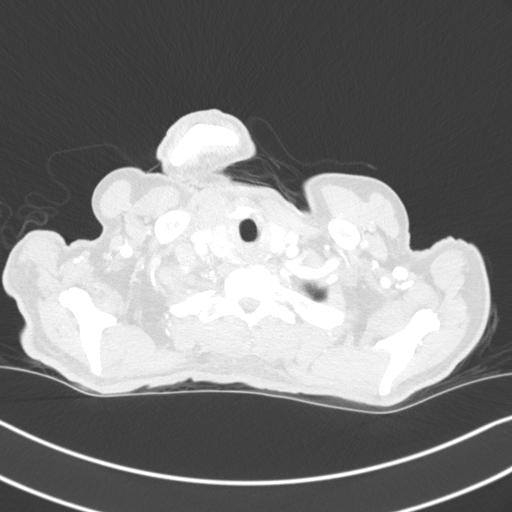

[Series 5: coronal · coronal · 0.52mm/px · 3 of 133 slices shown]
[im 27/133  lung]
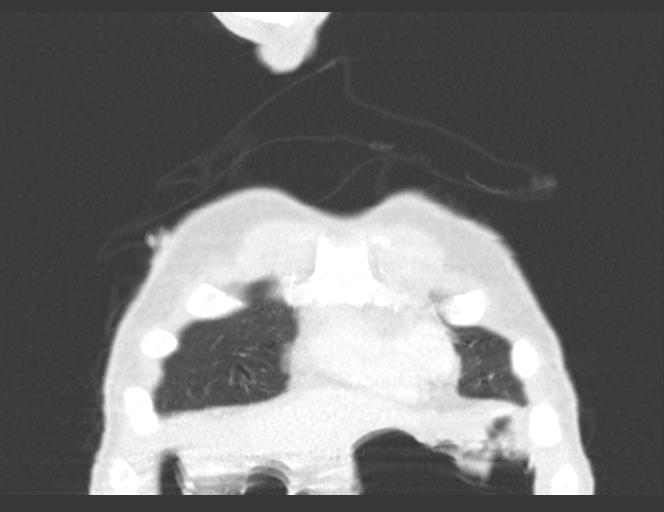
[im 53/133  lung]
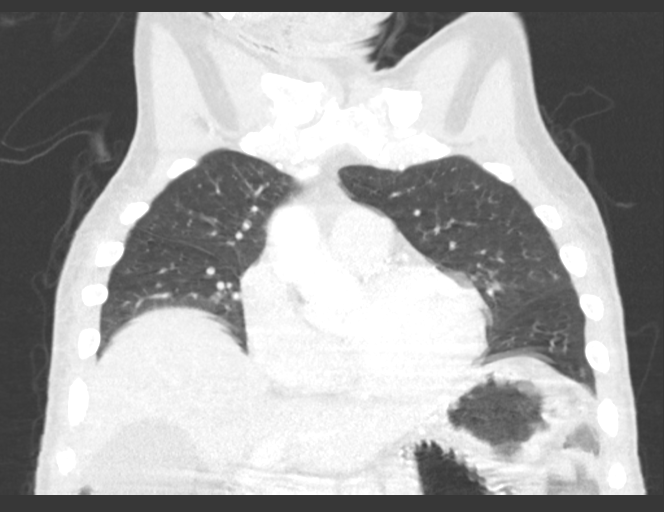
[im 80/133  lung]
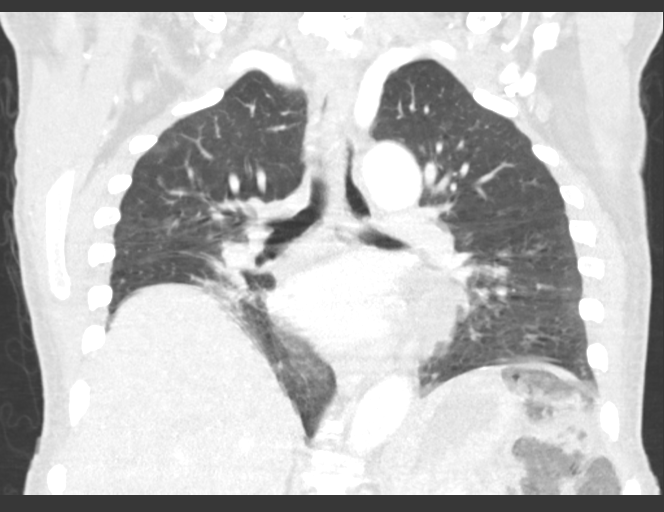

[14 of 36 positions shown; findings below may reference images not displayed]

FINDINGS: Cardiovascular: Tortuous thoracic aorta. No aortic aneurysm. Mild
dilatation of the main pulmonary artery at 3.3 cm. Borderline
cardiomegaly. Elongated fluid density structure adjacent to the
posterolateral left heart border spans approximately 3.2 cm, better
defined on prior abdominal CT, currently obscured by patient motion.
This is grossly stable. No significant pericardial effusion

Mediastinum/Nodes: No enlarged mediastinal or hilar lymph nodes.
Scattered prominent bilateral axillary nodes measuring up to 9 mm on
the right and 8 mm on the left. The esophagus is patulous with areas
of wall thickening. No definite hiatal hernia. No visualized thyroid
nodule.

Lungs/Pleura: Significant breathing motion artifact limits
assessment. Ground-glass and consolidative airspace disease in the
right lower lobe is similar to prior abdominal CT. Few patchy
ground-glass opacities in the superior segment of the right lower
lobe and adjacent right upper lobe with mild improvement. There is
mild improvement in the ground-glass opacity in the left lower lobe.
No new or progressive consolidation. No significant pleural
effusion. Limited assessment for nodule given motion.

Upper Abdomen: Included stomach contains small amount of
fluid/ingested material. Gastric anatomy is normal were visualized.
No evidence of gastric hernia. Motion artifact through the upper
abdomen.

Musculoskeletal: Thoracic spondylosis with multilevel endplate
spurring. There are no acute or suspicious osseous abnormalities.
IMPRESSION: 1. Patulous esophagus with areas of wall thickening, lower esophagus
appears similar to prior abdominal CT. No evidence of gastric or
hiatal hernia. The stomach in the included upper abdomen contains a
small amount of fluid and is unremarkable.
2. Multifocal airspace opacities, greatest in the right lower lobe.
There is overall mild improvement from prior exam. Breathing motion
artifact limits detailed assessment.
3. Elongated fluid density structure adjacent to the posterolateral
left heart border, better defined on prior abdominal CT, currently
obscured by patient motion. This is grossly stable, favor
pericardial cyst.
4. Mild dilatation of the main pulmonary artery suggesting pulmonary
arterial hypertension.
5. Coronary artery calcifications.

Aortic Atherosclerosis ([NS]-[NS]).

## 2020-09-18 SURGERY — ESOPHAGOGASTRODUODENOSCOPY (EGD) WITH PROPOFOL
Anesthesia: General

## 2020-09-18 MED ORDER — DEXAMETHASONE SODIUM PHOSPHATE 4 MG/ML IJ SOLN
INTRAMUSCULAR | Status: AC
  Filled 2020-09-18: qty 1

## 2020-09-18 MED ORDER — STERILE WATER FOR IRRIGATION IR SOLN
Status: DC | PRN
  Administered 2020-09-18: 1.5 mL

## 2020-09-18 MED ORDER — ONDANSETRON HCL 4 MG/2ML IJ SOLN
INTRAMUSCULAR | Status: AC
  Filled 2020-09-18: qty 2

## 2020-09-18 MED ORDER — PHENYLEPHRINE 40 MCG/ML (10ML) SYRINGE FOR IV PUSH (FOR BLOOD PRESSURE SUPPORT)
PREFILLED_SYRINGE | INTRAVENOUS | Status: AC
  Filled 2020-09-18: qty 10

## 2020-09-18 MED ORDER — IOHEXOL 300 MG/ML  SOLN
75.0000 mL | Freq: Once | INTRAMUSCULAR | Status: AC | PRN
  Administered 2020-09-18: 75 mL via INTRAVENOUS

## 2020-09-18 MED ORDER — GLUCAGON HCL RDNA (DIAGNOSTIC) 1 MG IJ SOLR
INTRAMUSCULAR | Status: DC | PRN
  Administered 2020-09-18: .25 mg via INTRAVENOUS

## 2020-09-18 MED ORDER — SODIUM CHLORIDE 0.9 % IV SOLN
INTRAVENOUS | Status: DC
  Administered 2020-09-18: 500 mL via INTRAVENOUS

## 2020-09-18 MED ORDER — LIDOCAINE HCL (PF) 2 % IJ SOLN
INTRAMUSCULAR | Status: AC
  Filled 2020-09-18: qty 5

## 2020-09-18 MED ORDER — APIXABAN 5 MG PO TABS
10.0000 mg | ORAL_TABLET | Freq: Two times a day (BID) | ORAL | Status: DC
  Administered 2020-09-18 – 2020-09-19 (×3): 10 mg via ORAL
  Filled 2020-09-18 (×4): qty 2

## 2020-09-18 MED ORDER — DEXAMETHASONE SODIUM PHOSPHATE 4 MG/ML IJ SOLN
INTRAMUSCULAR | Status: DC | PRN
  Administered 2020-09-18: 4 mg via INTRAVENOUS

## 2020-09-18 MED ORDER — VASOPRESSIN 20 UNIT/ML IV SOLN
INTRAVENOUS | Status: AC
  Filled 2020-09-18: qty 1

## 2020-09-18 MED ORDER — ONDANSETRON HCL 4 MG/2ML IJ SOLN
INTRAMUSCULAR | Status: DC | PRN
  Administered 2020-09-18: 4 mg via INTRAVENOUS

## 2020-09-18 MED ORDER — APIXABAN 5 MG PO TABS: 5.0000 mg | ORAL_TABLET | Freq: Two times a day (BID) | ORAL | Status: DC

## 2020-09-18 MED ORDER — FENTANYL CITRATE (PF) 100 MCG/2ML IJ SOLN
INTRAMUSCULAR | Status: AC
  Filled 2020-09-18: qty 2

## 2020-09-18 MED ORDER — PROPOFOL 10 MG/ML IV BOLUS
INTRAVENOUS | Status: AC
  Filled 2020-09-18: qty 20

## 2020-09-18 MED ORDER — EPHEDRINE 5 MG/ML INJ
INTRAVENOUS | Status: AC
  Filled 2020-09-18: qty 10

## 2020-09-18 MED ORDER — GLUCAGON HCL RDNA (DIAGNOSTIC) 1 MG IJ SOLR
INTRAMUSCULAR | Status: AC
  Filled 2020-09-18: qty 1

## 2020-09-18 MED ORDER — INSULIN ASPART 100 UNIT/ML ~~LOC~~ SOLN
0.0000 [IU] | Freq: Three times a day (TID) | SUBCUTANEOUS | Status: DC
  Administered 2020-09-18: 3 [IU] via SUBCUTANEOUS
  Administered 2020-09-19: 2 [IU] via SUBCUTANEOUS
  Administered 2020-09-19: 3 [IU] via SUBCUTANEOUS
  Administered 2020-09-19: 2 [IU] via SUBCUTANEOUS

## 2020-09-18 MED ORDER — SUCCINYLCHOLINE CHLORIDE 200 MG/10ML IV SOSY
PREFILLED_SYRINGE | INTRAVENOUS | Status: DC | PRN
  Administered 2020-09-18: 100 mg via INTRAVENOUS

## 2020-09-18 MED ORDER — PHENYLEPHRINE 40 MCG/ML (10ML) SYRINGE FOR IV PUSH (FOR BLOOD PRESSURE SUPPORT)
PREFILLED_SYRINGE | INTRAVENOUS | Status: DC | PRN
  Administered 2020-09-18 (×2): 80 ug via INTRAVENOUS

## 2020-09-18 MED ORDER — LIDOCAINE 2% (20 MG/ML) 5 ML SYRINGE
INTRAMUSCULAR | Status: DC | PRN
  Administered 2020-09-18: 100 mg via INTRAVENOUS

## 2020-09-18 MED ORDER — SODIUM CHLORIDE 0.9 % IV SOLN: INTRAVENOUS | Status: DC

## 2020-09-18 MED ORDER — PROPOFOL 10 MG/ML IV BOLUS
INTRAVENOUS | Status: DC | PRN
  Administered 2020-09-18: 100 mg via INTRAVENOUS

## 2020-09-18 MED ORDER — FENTANYL CITRATE (PF) 100 MCG/2ML IJ SOLN
INTRAMUSCULAR | Status: DC | PRN
  Administered 2020-09-18: 50 ug via INTRAVENOUS

## 2020-09-18 MED ORDER — SUCCINYLCHOLINE CHLORIDE 200 MG/10ML IV SOSY
PREFILLED_SYRINGE | INTRAVENOUS | Status: AC
  Filled 2020-09-18: qty 10

## 2020-09-18 NOTE — Anesthesia Procedure Notes (Signed)
Procedure Name: Intubation Date/Time: 09/18/2020 2:47 PM Performed by: Orlie Dakin, CRNA Pre-anesthesia Checklist: Patient identified, Emergency Drugs available, Suction available and Patient being monitored Patient Re-evaluated:Patient Re-evaluated prior to induction Oxygen Delivery Method: Circle system utilized Preoxygenation: Pre-oxygenation with 100% oxygen Induction Type: IV induction, Rapid sequence and Cricoid Pressure applied Laryngoscope Size: Glidescope and 3 Grade View: Grade I Tube type: Oral Tube size: 7.5 mm Number of attempts: 1 Airway Equipment and Method: Stylet and Video-laryngoscopy Placement Confirmation: ETT inserted through vocal cords under direct vision,  positive ETCO2 and breath sounds checked- equal and bilateral Secured at: 23 cm Tube secured with: Tape Dental Injury: Teeth and Oropharynx as per pre-operative assessment  Comments: Glidescope used due to ? SBO.

## 2020-09-18 NOTE — Progress Notes (Signed)
We will proceed with EGD as scheduled.  I thoroughly discussed with the patient his procedure, including the risks involved. Patient understands what the procedure involves including the benefits and any risks. Patient understands alternatives to the proposed procedure. Risks including (but not limited to) bleeding, tearing of the lining (perforation), rupture of adjacent organs, problems with heart and lung function, infection, and medication reactions. A small percentage of complications may require surgery, hospitalization, repeat endoscopic procedure, and/or transfusion.  Patient understood and agreed.  Cherith Tewell Castaneda, MD Gastroenterology and Hepatology [facility]  

## 2020-09-18 NOTE — Brief Op Note (Signed)
09/15/2020 - 09/18/2020  3:23 PM  PATIENT:  Daniel [name]  70 y.o. male  PRE-OPERATIVE DIAGNOSIS:  hematemesis, history of PUD  POST-OPERATIVE DIAGNOSIS:  duodenal mass, healed gastric ulcer, large esophageal hiatal hernia, esophagitis  PROCEDURE:  Procedure(s): ESOPHAGOGASTRODUODENOSCOPY (EGD) WITH PROPOFOL (N/A) BIOPSY  SURGEON:  Surgeon(s) and Role:    * Harvel Quale, MD - Primary  Patient underwent EGD under general anesthesia.  Patient tolerated the procedure adequately.  There was presence of some areas of erythema in the gastric wall which had a circular shape, possibly from previous NG tube placement.  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  After multiple attempts, the pylorus could be transversed, in the second portion of the duodenum there was visualization of 2.5 cm x 3 cm multilobulated mass with adenomatous appearance without presence of any central depression or ongoing bleeding, it was not obstructing the gastrointestinal lumen.  The mass was biopsied.  RECOMMENDATIONS: -Advance diet as tolerated - Check H&H every day - Perform chest CT today -Follow-up pathology report  Daniel Peppers, MD Gastroenterology and Hepatology [facility]

## 2020-09-18 NOTE — Anesthesia Preprocedure Evaluation (Addendum)
Anesthesia Evaluation  Patient identified by MRN, date of birth, ID band Patient awake and Patient confused    Reviewed: Allergy & Precautions, NPO status , Patient's Chart, lab work & pertinent test results, reviewed documented beta blocker date and time   History of Anesthesia Complications Negative for: history of anesthetic complications  Airway Mallampati: III  TM Distance: >3 FB Neck ROM: Full    Dental  (+) Dental Advisory Given, Missing   Pulmonary pneumonia,    Pulmonary exam normal breath sounds clear to auscultation       Cardiovascular Exercise Tolerance: Poor hypertension, Pt. on medications and Pt. on home beta blockers + DVT   Rhythm:Regular Rate:Tachycardia  19-Jun-2020 21:21:55 [facility]-ED ROUTINE RECORD Sinus tachycardia Rightward axis Cannot rule out Anterior infarct , age undetermined Abnormal ECG No significant change since last tracing Confirmed by Gareth Morgan [phone]) on 06/21/2020 9:12:53 AM   Neuro/Psych PSYCHIATRIC DISORDERS Depression Dementia CVA, Residual Symptoms    GI/Hepatic Neg liver ROS, PUD, GERD  Medicated and Poorly Controlled,  Endo/Other  diabetes, Well Controlled, Type 2, Oral Hypoglycemic Agents  Renal/GU Renal InsufficiencyRenal disease     Musculoskeletal  (+) Arthritis ,   Abdominal   Peds  Hematology  (+) anemia ,   Anesthesia Other Findings   Reproductive/Obstetrics                            Anesthesia Physical  Anesthesia Plan  ASA: III  Anesthesia Plan: General   Post-op Pain Management:    Induction: Intravenous, Rapid sequence and Cricoid pressure planned  PONV Risk Score and Plan: 3 and Ondansetron and Dexamethasone  Airway Management Planned: Oral ETT  Additional Equipment:   Intra-op Plan:   Post-operative Plan: Extubation in OR  Informed Consent: I have reviewed the patients History and  Physical, chart, labs and discussed the procedure including the risks, benefits and alternatives for the proposed anesthesia with the patient or authorized representative who has indicated his/her understanding and acceptance.     Dental advisory given and Consent reviewed with POA  Plan Discussed with: CRNA and Surgeon  Anesthesia Plan Comments:         Anesthesia Quick Evaluation

## 2020-09-18 NOTE — Anesthesia Postprocedure Evaluation (Signed)
Anesthesia Post Note  Patient: ANTHONEE GELIN  Procedure(s) Performed: ESOPHAGOGASTRODUODENOSCOPY (EGD) WITH PROPOFOL (N/A ) BIOPSY  Patient location during evaluation: PACU Anesthesia Type: General Level of consciousness: awake and alert Pain management: pain level controlled Vital Signs Assessment: post-procedure vital signs reviewed and stable Respiratory status: spontaneous breathing and respiratory function stable Cardiovascular status: blood pressure returned to baseline and stable Postop Assessment: no apparent nausea or vomiting Anesthetic complications: no   No complications documented.   Last Vitals:  Vitals:   09/18/20 1514 09/18/20 1515  BP: 140/77 140/77  Pulse: 84 86  Resp: (!) 23 19  Temp: 36.6 C   SpO2: 100% 100%    Last Pain:  Vitals:   09/18/20 1515  TempSrc:   PainSc: 0-No pain                 Aubrionna Istre C Deric Bocock

## 2020-09-18 NOTE — Progress Notes (Signed)
Patient Demographics:    Daniel [name], is a 70 y.o. male, DOB - 06-Nov-1950, PIR:[id number]  Admit date - 09/15/2020   Admitting Physician Bernadette Hoit, DO  Outpatient Primary MD for the patient is Leonie Douglas, MD  LOS - 3   Chief Complaint  Patient presents with  . Emesis        Subjective:    Daniel [name] today has no fevers, no further emesis,  No chest pain No fever  Or chills  09/18/20 I called and updated legal guardian [name] at  [phone]  -Tolerated EGD with biopsy well   Assessment  & Plan :    Principal Problem:   Ileus ([place]) Active Problems:   Hypertension   BPH (benign prostatic hyperplasia)   Dementia ([place])   Hyperglycemia due to diabetes mellitus (HCC)   Normocytic anemia   Multiple gastric ulcers   Esophagitis   Duodenal mass   DVT (deep venous thrombosis) (HCC)   Vomiting   Leukocytosis   Hypoalbuminemia due to protein-calorie malnutrition (HCC)   Hyperlipidemia   Pericardial cyst  Brief Summary:- 70 y.o. male with medical history significant for hypertension, T2DM,dementia, esophagitis, gastric ulcers, DVT and duodenal mass admitted from SNF on 09/15/2020 with concerns for hematemesis   A/p 1)Hematemesis--- no further vomiting here, -IV Protonix as ordered -Monitor H&H and transfuse as needed Hgb is  9.5 -GI consult appreciated-- -EGD from January 2022 showed distal  Moderately severe reflux esophagitis. Multiple gastric ulcers without bleeding stigmata and associated gastric erosions. 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 Biopsied  2)History of severe constipation with recurrent pSBO-now presenting with suspected ileus CT abdomen and pelvis showed  stable dilated stomach and colon, compatible with ileus.  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 Jenetta Downer okay to restart Eliquis -We will monitor closely for bleeding  4)Duodenal mass patient had initial consultation with Dr. Rush Landmark on 09/14/2020 -Plan is for EGD with EUS and duodenal mass excision in June 2022 by Dr. Rush Landmark -EGD with biopsy by Dr. 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  7)Type 2 DM -  -Use Novolog/Humalog Sliding scale insulin with Accu-Cheks/Fingersticks as ordered   8)Goals of care: . -Palliative care practitioner Ms. 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   9) dementia--patient with baseline cognitive and memory deficits, continue Namenda and Aricept  10)Pericardial cyst CT abdomen and pelvis with contrast showed stable 5.6 x 2.4 cm oval area of fluid density adjacent to the posterolateral aspect of the heart on the left, compatible with a pericardial cyst. -CT chest pending Echo pending  Code Status:currently full code  Family Communication:has a legal guardian  Disposition/Need for in-Hospital Stay- patient unable to be discharged at this time due to --possible GI bleed requiring further observation and possible transfusion--- as well as IV Protonix and IV fluids until oral intake is reliable  Status is: Inpatient  Remains inpatient appropriate because:Please see above   Disposition: The patient is  from: SNF              Anticipated d/c is to: SNF              Anticipated d/c date is: 1 day              Patient currently is not medically stable to d/c. Barriers: Not Clinically Stable-   Code Status : -  Code Status: Full Code   Family Communication:   Legal guardian -I called and updated legal guardian [name] at  [phone]  Consults  :  Gi  DVT Prophylaxis  :   -Eliquis    Lab Results  Component Value Date   PLT 319 09/18/2020    Inpatient Medications  Scheduled Meds: . aMILoride  5 mg Oral Daily  . amitriptyline  10 mg Oral QHS  . atorvastatin  40 mg Oral QPM  . donepezil  5 mg Oral QHS  . insulin aspart  0-9 Units Subcutaneous Q4H  . memantine  5 mg Oral Daily  . metoCLOPramide (REGLAN) injection  5 mg Intravenous TID AC  . metoprolol tartrate  25 mg Oral BID  . phosphorus  250 mg Oral TID  . polyethylene glycol  17 g Oral BID  . senna-docusate  2 tablet Oral QHS   Continuous Infusions: . sodium chloride 50 mL/hr at 09/16/20 1718  . pantoprozole (PROTONIX) infusion 8 mg/hr (09/18/20 1442)   PRN Meds:.iopamidol, ondansetron (ZOFRAN) IV    Anti-infectives (From admission, onward)   None        Objective:   Vitals:   09/18/20 1413 09/18/20 1514 09/18/20 1515 09/18/20 1530  BP: (!) 150/93 140/77 140/77 138/75  Pulse: 88 84 86 83  Resp: (!) 22 (!) 23 19 (!) 23  Temp: 98.5 F (36.9 C) 97.8 F (36.6 C)    TempSrc: Oral     SpO2: 97% 100% 100% 97%  Weight:      Height:        Wt Readings from Last 3 Encounters:  09/15/20 66.5 kg  09/09/20 60 kg  08/28/20 68 kg     Intake/Output Summary (Last 24 hours) at 09/18/2020 1637 Last data filed at 09/18/2020 1516 Gross per 24 hour  Intake 800 ml  Output 4350 ml  Net -3550 ml   Physical Exam  Gen:- Awake Alert, no acute distress HEENT:-Right eye vision loss, Neck-Supple Neck,No JVD,.  Lungs-  CTAB , good air movement bilaterally  CV- S1, S2 normal, regular Abd-  +ve B.Sounds, Abd Soft, No  tenderness,    Extremity/Skin:-No significant lower extremity swelling or tenderness,   good pulses Psych-affect is flat, not very talkative  neuro-generalized weakness, no new focal deficits, no tremors    Data Review:   Micro Results Recent Results (from the past 240 hour(s))  Resp Panel by RT-PCR (Flu A&B, Covid) Nasopharyngeal Swab     Status: None   Collection Time: 09/12/20  4:06 PM   Specimen: Nasopharyngeal Swab; Nasopharyngeal(NP) swabs in vial transport medium  Result Value Ref Range Status   SARS Coronavirus 2 by RT PCR NEGATIVE NEGATIVE Final  Comment: (NOTE) SARS-CoV-2 target nucleic acids are NOT DETECTED.  The SARS-CoV-2 RNA is generally detectable in upper respiratory specimens during the acute phase of infection. The lowest concentration of SARS-CoV-2 viral copies this assay can detect is 138 copies/mL. A negative result does not preclude SARS-Cov-2 infection and should not be used as the sole basis for treatment or other patient management decisions. A negative result may occur with  improper specimen collection/handling, submission of specimen other than nasopharyngeal swab, presence of viral mutation(s) within the areas targeted by this assay, and inadequate number of viral copies(<138 copies/mL). A negative result must be combined with clinical observations, patient history, and epidemiological information. The expected result is Negative.  Fact Sheet for Patients:  EntrepreneurPulse.com.au  Fact Sheet for Healthcare Providers:  IncredibleEmployment.be  This test is no t yet approved or cleared by the [place] FDA and  has been authorized for detection and/or diagnosis of SARS-CoV-2 by FDA under an Emergency Use Authorization (EUA). This EUA will remain  in effect (meaning this test can be used) for the duration of the COVID-19 declaration under Section 564(b)(1) of the Act, 21 U.S.C.section 360bbb-3(b)(1), unless  the authorization is terminated  or revoked sooner.       Influenza A by PCR NEGATIVE NEGATIVE Final   Influenza B by PCR NEGATIVE NEGATIVE Final    Comment: (NOTE) The Xpert Xpress SARS-CoV-2/FLU/RSV plus assay is intended as an aid in the diagnosis of influenza from Nasopharyngeal swab specimens and should not be used as a sole basis for treatment. Nasal washings and aspirates are unacceptable for Xpert Xpress SARS-CoV-2/FLU/RSV testing.  Fact Sheet for Patients: EntrepreneurPulse.com.au  Fact Sheet for Healthcare Providers: IncredibleEmployment.be  This test is not yet approved or cleared by the [place] FDA and has been authorized for detection and/or diagnosis of SARS-CoV-2 by FDA under an Emergency Use Authorization (EUA). This EUA will remain in effect (meaning this test can be used) for the duration of the COVID-19 declaration under Section 564(b)(1) of the Act, 21 U.S.C. section 360bbb-3(b)(1), unless the authorization is terminated or revoked.  Performed at [facility], [address], [place], [place] [address]   SARS CORONAVIRUS 2 (TAT 6-24 HRS) Nasopharyngeal Nasopharyngeal Swab     Status: None   Collection Time: 09/15/20 11:35 PM   Specimen: Nasopharyngeal Swab  Result Value Ref Range Status   SARS Coronavirus 2 NEGATIVE NEGATIVE Final    Comment: (NOTE) SARS-CoV-2 target nucleic acids are NOT DETECTED.  The SARS-CoV-2 RNA is generally detectable in upper and lower respiratory specimens during the acute phase of infection. Negative results do not preclude SARS-CoV-2 infection, do not rule out co-infections with other pathogens, and should not be used as the sole basis for treatment or other patient management decisions. Negative results must be combined with clinical observations, patient history, and epidemiological information. The expected result is Negative.  Fact Sheet for  Patients: SugarRoll.be  Fact Sheet for Healthcare Providers: https://www.woods-mathews.com/  This test is not yet approved or cleared by the [place] FDA and  has been authorized for detection and/or diagnosis of SARS-CoV-2 by FDA under an Emergency Use Authorization (EUA). This EUA will remain  in effect (meaning this test can be used) for the duration of the COVID-19 declaration under Se ction 564(b)(1) of the Act, 21 U.S.C. section 360bbb-3(b)(1), unless the authorization is terminated or revoked sooner.  Performed at [facility], [place] [address], [place], [place] [address]     Radiology Reports DG Abd 1  View  Result Date: 09/09/2020 CLINICAL DATA:  Gastric distension EXAM: ABDOMEN - 1 VIEW COMPARISON:  September 08, 2020 FINDINGS: Nasogastric tube is seen with its distal tip noted along the expected region of the gastric fundus. Air is seen throughout the large and small bowel with mildly dilated small bowel loops seen within the left lower quadrant. No radio-opaque calculi or other significant radiographic abnormality are seen. 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 IMPRESSION: 1. Nasogastric tube positioning, as described above. 2. Additional findings suggestive of an early partial small bowel obstruction versus ileus. Electronically Signed   By: Virgina Norfolk M.D.   On: 09/09/2020 18:31   DG Abdomen 1 View  Result Date: 09/08/2020 CLINICAL DATA:  Nasogastric tube placement EXAM: ABDOMEN - 1 VIEW COMPARISON:  09/07/2020 FINDINGS: Nasogastric tube tip and side port project within the stomach. Dilated and gas-filled bowel seen throughout the abdomen. Excreted contrast in the urinary bladder and right renal collecting system. IMPRESSION: Nasogastric tube tip and side port in the stomach. Electronically Signed   By: Ulyses Jarred M.D.   On: 09/08/2020 00:54   CT ABDOMEN PELVIS W CONTRAST  Result Date: 09/15/2020 CLINICAL DATA:   Gastrointestinal bleeding. Projectile vomiting with coffee-ground brown emesis. EXAM: CT ABDOMEN AND PELVIS WITH CONTRAST TECHNIQUE: Multidetector CT imaging of the abdomen and pelvis was performed using the standard protocol following bolus administration of intravenous contrast. CONTRAST:  149mL OMNIPAQUE 300 COMPARISON:  09/07/2020 FINDINGS: Lower chest: Stable 5.6 x 2.4 cm oval area of fluid density adjacent to the posterolateral aspect of the heart on the left on image number 14/6. Diffuse low density distal esophageal wall thickening with improvement. Normal sized heart. Atheromatous coronary artery calcifications. Patchy densities in the right lower lobe with some consolidation with mild improvement. Patchy densities in the posterior right upper lobe with mild improvement. Small amount of interval dependent patchy density in the left lower lobe. No pleural fluid. Hepatobiliary: No focal liver abnormality is seen. No gallstones, gallbladder wall thickening, or biliary dilatation. Pancreas: Stable normal appearing pancreatic head and proximal neck. 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 Spleen: Normal in size without focal abnormality. Adrenals/Urinary Tract: Normal appearing adrenal glands. Stable right renal cysts and small parenchymal calcification. Stable possible small, severely atrophied left kidney. Unremarkable urinary bladder and right ureter. Stomach/Bowel: Stable dilated stomach and colon. Normal caliber small bowel. No evidence of appendicitis. Vascular/Lymphatic: No significant vascular findings are present. No enlarged abdominal or pelvic lymph nodes. Reproductive: Prostate is unremarkable. Other: Small umbilical hernia containing fat. Musculoskeletal: Stable left hip prosthesis. Lumbar and lower thoracic spine degenerative changes. Stable bilateral L5 pars defects with grade 2 anterolisthesis and vertebral body fusion at the L5-S1 level. IMPRESSION: 1. No acute abnormality.  2. Improving distal esophagitis. 3. Stable dilated stomach and colon, compatible with ileus. No findings to indicate interval gastric outlet obstruction. 4. Mildly improved bilateral lung opacities, compatible with improving pneumonia. 5. Stable 5.6 x 2.4 cm oval area of fluid density adjacent to the posterolateral aspect of the heart on the left, compatible with a pericardial cyst. 6. 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 7. Stable possible small, severely atrophied left kidney. Electronically Signed   By: Claudie Revering M.D.   On: 09/15/2020 21:58   CT ABDOMEN PELVIS W CONTRAST  Result Date: 09/07/2020 CLINICAL DATA:  Nausea and vomiting coffee ground emesis EXAM: CT ABDOMEN AND PELVIS WITH CONTRAST TECHNIQUE: Multidetector CT imaging of the  abdomen and pelvis was performed using the standard protocol following bolus administration of intravenous contrast. CONTRAST:  164mL OMNIPAQUE IOHEXOL 300 MG/ML  SOLN COMPARISON:  09/07/2020, CT 08/24/2020, CT 07/27/2020 FINDINGS: Lower chest: Lung bases demonstrate ground-glass density and patchy consolidation in the right lower lobe and posterior right lower lobe. No pleural effusion. Normal cardiac size. Marked circumferential wall thickening of the distal esophagus. Hepatobiliary: No calcified gallstone. No focal hepatic abnormality or biliary dilatation Pancreas: Truncated appearance of pancreas.  No inflammatory change. Spleen: Normal in size without focal abnormality. Adrenals/Urinary Tract: Adrenal glands are normal. No definitive left renal tissue identified, question very diminutive atrophic left kidney, series 3, image number 41. Small cysts in the right kidney. Subcentimeter hypodense lesions too small to further characterize. The bladder is normal. Stomach/Bowel: Marked fluid distension of the stomach. Some fluid-filled nondistended small bowel in the pelvis. Large volume of stool in the  colon. Marked air distension of colon in the right upper quadrant, this appears to be sigmoid colon. Slightly narrowed appearing sigmoid colon that traverses horizontally and extends to right upper quadrant, series 3 image number 55 through 65. Considerable stool distal to the loop of dilated sigmoid colon with large feces in the rectum. No acute bowel wall thickening. No corkscrew appearance of mesentery or vasculature in the region of sigmoid colon narrowing. Similar configuration of the colon on 07/27/2020 but more normal course of sigmoid colon on CT from January of 2022. Vascular/Lymphatic: Nonaneurysmal aorta. Aortic atherosclerosis. No suspicious nodes Reproductive: Prostate is unremarkable. 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 Left hip arthroplasty with artifact. IMPRESSION: 1. Interval marked circumferential wall thickening of the distal esophagus, probably due to esophagitis. Marked fluid distension of the stomach without evidence for small bowel obstruction, question functional obstruction. Consider NG tube decompression. 2. Generalized distension of the colon with large feces throughout. More focally dilated loop of bowel in the right upper quadrant appears to represent dilated segment of sigmoid colon. Slightly aberrant course of sigmoid colon which appears slightly narrowed at the right lower quadrant with adjacent slightly narrowed segment of rectosigmoid colon, question some degree of colon obstruction, potentially from internal hernia. No corkscrew appearance to suggest a volvulus. Considerable stool within the rectosigmoid colon distal to this suggesting that obstruction may be partial. Note that a similar configuration of the colon was present on CT from 07/27/2020 3. Ground-glass density and patchy consolidation in the right lower lobe and posterior right upper lobe  concerning for pneumonia or aspiration. 4. Absent left kidney Aortic Atherosclerosis (ICD10-I70.0). Electronically Signed   By: Donavan Foil M.D.   On: 09/07/2020 22:57   US Venous Img Lower Bilateral (DVT)  Result Date: 09/17/2020 CLINICAL DATA:  History of right lower extremity DVT EXAM: BILATERAL LOWER EXTREMITY VENOUS DOPPLER ULTRASOUND TECHNIQUE: Gray-scale sonography with graded compression, as well as color Doppler and duplex ultrasound were performed to evaluate the lower extremity deep venous systems from the level of the common femoral vein and including the common femoral, femoral, profunda femoral, popliteal and calf veins including the posterior tibial, peroneal and gastrocnemius veins when visible. The superficial great saphenous vein was also interrogated. Spectral Doppler was utilized to evaluate flow at rest and with distal augmentation maneuvers in the common femoral, femoral and popliteal veins. COMPARISON:  None. FINDINGS: RIGHT LOWER EXTREMITY Common Femoral Vein: Incompletely compressible. Heterogeneous echogenic material present within the lumen  consistent with nonocclusive DVT. Partial flow visualized on color Doppler imaging. Saphenofemoral Junction: Nonocclusive thrombus extends into the saphenofemoral junction. Profunda Femoral Vein: No evidence of thrombus. Normal compressibility and flow on color Doppler imaging. Femoral Vein: Nonocclusive thrombus extends into the femoral vein throughout the thigh. Popliteal Vein: Thrombus extends into the popliteal vein where it is occlusive. Calf Veins: Occlusive thrombus extends into the peroneal and posterior tibial veins. Superficial Great Saphenous Vein: No evidence of thrombus. Normal compressibility. Venous Reflux:  None. Other Findings:  None. LEFT LOWER EXTREMITY Common Femoral Vein: No evidence of thrombus. Normal compressibility, respiratory phasicity and response to augmentation. Saphenofemoral Junction: No evidence of thrombus. Normal  compressibility and flow on color Doppler imaging. Profunda Femoral Vein: No evidence of thrombus. Normal compressibility and flow on color Doppler imaging. Femoral Vein: No evidence of thrombus. Normal compressibility, respiratory phasicity and response to augmentation. Popliteal Vein: No evidence of thrombus. Normal compressibility, respiratory phasicity and response to augmentation. Calf Veins: No evidence of thrombus. Normal compressibility and flow on color Doppler imaging. Superficial Great Saphenous Vein: No evidence of thrombus. Normal compressibility. Venous Reflux:  None. Other Findings:  None. IMPRESSION: 1. Occlusive thrombus present within the right calf veins and right popliteal vein. Nonocclusive thrombus extends throughout the femoral vein in the thigh and into the common femoral vein. Given reported history of prior right lower extremity DVT, findings are favored to represent subacute versus acute on chronic DVT. Recommend correlation with prior outside imaging if available. 2. No evidence of DVT in the left lower extremity. Electronically Signed   By: Jacqulynn Cadet M.D.   On: 09/17/2020 12:33   DG Chest Port 1 View  Result Date: 09/15/2020 CLINICAL DATA:  Coffee-ground emesis EXAM: PORTABLE CHEST 1 VIEW COMPARISON:  07/27/2020 FINDINGS: The heart size and mediastinal contours are within normal limits. Both lungs are clear. Unchanged elevation of the bilateral hemidiaphragms with associated scarring or atelectasis. The visualized skeletal structures are unremarkable. IMPRESSION: No acute abnormality of the lungs in AP portable projection. Electronically Signed   By: Eddie Candle M.D.   On: 09/15/2020 19:14   DG ABD ACUTE 2+V W 1V CHEST  Result Date: 09/07/2020 CLINICAL DATA:  Abdominal distension, emesis and lower extremity swelling EXAM: DG ABDOMEN ACUTE WITH 1 VIEW CHEST COMPARISON:  CT 08/24/2020, MR 06/25/2020, CT 06/19/2020 FINDINGS: There is extensive severe and increasing air  distention of what appears to be the colon given some haustra markings although several distended loops of bowel in the low pelvis could reflect some small bowel dilatation as well. A large volume of feculent material is noted within portions of the distended colon as well. No convincing evidence of subdiaphragmatic free air at this time. No suspicious abdominal calcifications. Lung volumes are markedly diminished. Some streaky and patchy opacities in the lung bases may reflect atelectasis, airspace disease or sequela of aspiration in the provided clinical context. Cardiomediastinal contours are unremarkable for the portable upright technique. No pneumothorax. No layering pleural effusion. Telemetry leads overlie the chest and abdomen with nasal cannula the base of the neck. Multilevel degenerative changes in the shoulders, spine, hips and pelvis with a prior left hip arthroplasty. IMPRESSION: Severe and increasing air distention of what appears to be the colon given some haustra markings although several distended loops of bowel in the low pelvis could reflect some small bowel dilatation as well. Large volume of feculent material within portions of the distended colon as well. Appearance could reflect a colonic obstruction or pseudo-obstruction versus ileus.  Electronically Signed   By: Lovena Le M.D.   On: 09/07/2020 20:58   DG Abd Portable 2V  Result Date: 09/10/2020 CLINICAL DATA:  Chronic constipation and bowel obstruction EXAM: PORTABLE ABDOMEN - 2 VIEW COMPARISON:  September 09, 2020 FINDINGS: Supine and upright images obtained. Nasogastric tube tip and side port in stomach. There is moderate stool in the colon. There is mild transverse colonic dilatation with several loops of small bowel upper normal in size. No air-fluid levels. No free air evident. Atelectatic change noted in right lung base. IMPRESSION: Nasogastric tube tip and side port in stomach. Moderate stool in colon. Borderline prominence of bowel  loops potentially could indicate enteritis or ileus. Bowel obstruction not felt to be likely. No free air. Atelectatic change right lung base. Electronically Signed   By: Lowella Grip III M.D.   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 Follow-up study. EXAM: CT ABDOMEN WITHOUT AND WITH CONTRAST TECHNIQUE: Multidetector CT imaging of the abdomen was performed following the standard protocol before and following the bolus administration of intravenous contrast. CONTRAST:  118mL OMNIPAQUE IOHEXOL 300 MG/ML  SOLN COMPARISON:  CT the abdomen and pelvis 06/19/2020. MRI of the abdomen with MRCP 06/25/2020. FINDINGS: Lower chest: Areas of subsegmental atelectasis or scarring are noted in the right lung base. Hepatobiliary: No suspicious cystic or solid hepatic lesions. No intra or extrahepatic biliary ductal dilatation. Gallbladder is normal in appearance. Pancreas: No pancreatic mass. No pancreatic ductal dilatation. No pancreatic or peripancreatic fluid collections or inflammatory changes. 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 Spleen: Unremarkable. 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 No surgical clips are noted in the left retroperitoneum. Multiple subcentimeter low-attenuation lesions in the right kidney, too small to definitively characterize, but likely to represent cysts based on their appearance on prior abdominal MRI. Small nonobstructive calculi are present within the right renal collecting system measuring up to 5 mm in the interpolar region. No suspicious right renal lesions. No right hydroureteronephrosis in the visualized portions of the abdomen. Bilateral adrenal glands are normal in appearance. Stomach/Bowel: Normal appearance of the stomach. No pathologic dilatation  of the visualized portions of small bowel or colon. Vascular/Lymphatic: No aneurysm identified in the visualized abdominal vasculature. No lymphadenopathy noted in the abdomen. Other: Small umbilical hernia containing only omental fat. No significant volume of ascites and no pneumoperitoneum noted in the visualized portions of the peritoneal cavity. Musculoskeletal: No aggressive appearing osseous lesions are noted in the visualized portions of the skeleton. Status post left hip arthroplasty. IMPRESSION: 1. No pancreatic ductal dilatation. 2. No acute findings in the abdomen. 3. Incidental findings, as above. Electronically Signed   By: Vinnie Langton M.D.   On: 08/25/2020 06:34     CBC Recent Labs  Lab 09/15/20 1850 09/16/20 0555 09/17/20 0538 09/18/20 0841  WBC 13.3* 12.2* 5.8 5.1  HGB 11.7* 9.6* 8.4* 9.5*  HCT 36.6* 29.7* 25.4* 29.5*  PLT 399 328 322 319  MCV 84.9 85.8 85.2 84.5  MCH 27.1 27.7 28.2 27.2  MCHC 32.0 32.3 33.1 32.2  RDW 16.5* 17.0* 17.1* 17.0*  LYMPHSABS 0.6*  --   --   --   MONOABS 0.7  --   --   --   EOSABS 0.0  --   --   --   BASOSABS  0.0  --   --   --     Chemistries  Recent Labs  Lab 09/12/20 0529 09/15/20 1850 09/16/20 0555 09/17/20 0538  NA 135 135 135 132*  K 3.5 4.8 4.1 4.4  CL 96* 92* 99 99  CO2 27 29 26 25   GLUCOSE 173* 221* 122* 128*  BUN 9 25* 24* 21  CREATININE 0.82 1.16 1.16 1.06  CALCIUM 9.3 11.6* 10.3 9.7  MG 1.7  --  1.6*  --   AST 18 24 15   --   ALT 24 34 25  --   ALKPHOS 94 102 82  --   BILITOT 0.4 0.5 0.7  --    ------------------------------------------------------------------------------------------------------------------ No results for input(s): CHOL, HDL, LDLCALC, TRIG, CHOLHDL, LDLDIRECT in the last 72 hours.  Lab Results  Component Value Date   HGBA1C 7.1 (H) 09/08/2020   ------------------------------------------------------------------------------------------------------------------ No results for input(s): TSH,  T4TOTAL, T3FREE, THYROIDAB in the last 72 hours.  Invalid input(s): FREET3 ------------------------------------------------------------------------------------------------------------------ No results for input(s): VITAMINB12, FOLATE, FERRITIN, TIBC, IRON, RETICCTPCT in the last 72 hours.  Coagulation profile Recent Labs  Lab 09/15/20 1850 09/16/20 0555  INR 1.1 1.2    No results for input(s): DDIMER in the last 72 hours.  Cardiac Enzymes No results for input(s): CKMB, TROPONINI, MYOGLOBIN in the last 168 hours.  Invalid input(s): CK ------------------------------------------------------------------------------------------------------------------ No results found for: BNP   Roxan Hockey M.D on 09/18/2020 at 4:37 PM  Go to [email or web address] - for contact info  [facility] - Office  [phone]

## 2020-09-18 NOTE — Transfer of Care (Signed)
Immediate Anesthesia Transfer of Care Note  Patient: Daniel [name]  Procedure(s) Performed: ESOPHAGOGASTRODUODENOSCOPY (EGD) WITH PROPOFOL (N/A ) BIOPSY  Patient Location: PACU  Anesthesia Type:General  Level of Consciousness: awake and oriented  Airway & Oxygen Therapy: Patient Spontanous Breathing and Patient connected to face mask oxygen  Post-op Assessment: Report given to RN and Post -op Vital signs reviewed and stable  Post vital signs: Reviewed and stable  Last Vitals:  Vitals Value Taken Time  BP 140/84 09/18/20 1515  Temp    Pulse 83 09/18/20 1515  Resp 26 09/18/20 1515  SpO2 100 % 09/18/20 1515  Vitals shown include unvalidated device data.  Last Pain:  Vitals:   09/18/20 1442  TempSrc:   PainSc: 0-No pain      Patients Stated Pain Goal: 5 (92/34/14 4360)  Complications: No complications documented.

## 2020-09-18 NOTE — Op Note (Signed)
[facility] Patient Name: Daniel [name] Procedure Date: 09/18/2020 2:22 PM MRN: [id number] Date of Birth: [birth year] Attending MD: [name] ,  CSN: [id number] Age: 70 Admit Type: Outpatient Procedure:                Upper GI endoscopy Indications:              Coffee-ground emesis Providers:                [name], Caprice Kluver, Thomas Hoff.,                            Technician Referring MD:              Medicines:                General Anesthesia Complications:            No immediate complications. Estimated Blood Loss:     Estimated blood loss: none. Procedure:                Pre-Anesthesia Assessment:                           - Prior to the procedure, a History and Physical                            was performed, and patient medications, allergies                            and sensitivities were reviewed. The patient's                            tolerance of previous anesthesia was reviewed.                           - The risks and benefits of the procedure and the                            sedation options and risks were discussed with the                            patient. All questions were answered and informed                            consent was obtained.                           - ASA Grade Assessment: III - A patient with severe                            systemic disease.                           After obtaining informed consent, the endoscope was                            passed under direct vision. Throughout the  procedure, the patient's blood pressure, pulse, and                            oxygen saturations were monitored continuously. The                            [id number]-[id number] ([id number]) scope was introduced through the                            mouth, and advanced to the second part of duodenum.                            The upper GI endoscopy was accomplished without                             difficulty. The patient tolerated the procedure                            well. Scope In: 2:52:22 PM Scope Out: 3:06:24 PM Total Procedure Duration: 0 hours 14 minutes 2 seconds  Findings:      The examined esophagus was normal.      Localized erythematous mucosa without bleeding was found in the gastric       body. These areas were circular , likely due to NG tube.      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      A 2.5 x 3.0 cm frond-like/villous multilobulated mass with no bleeding       was found in the second portion of the duodenum. There was no       obstruction of the lumen. Mass is likely adenomatous, which was       inspected with NBI. This was biopsied with a cold forceps for histology. Impression:               - Normal esophagus.                           - Erythematous mucosa in the gastric body, likely                            due to NG tube.                           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 Biopsied. Moderate Sedation:      Per Anesthesia Care Recommendation:           - Return patient to hospital ward for ongoing care.                           -  Advance diet as tolerated                           - Check H&H every day                           - Perform chest CT today                           -Follow-up pathology report Procedure Code(s):        --- Professional ---                           [phone], Esophagogastroduodenoscopy, flexible,                            transoral; with biopsy, single or multiple Diagnosis Code(s):        --- Professional ---                           K31.89, Other diseases of stomach and duodenum                           K92.0, Hematemesis CPT copyright 2019 [employer]. All rights  reserved. The codes documented in this report are preliminary and upon coder review may  be revised to meet current compliance requirements. [name], MD [name],  09/18/2020 3:38:55 PM This report has been signed electronically. Number of Addenda: 0

## 2020-09-18 NOTE — Telephone Encounter (Signed)
Having read the chart, there seems to be concern for recurrent coffee-ground emesis/hematemesis.  With decreasing hemoglobin, diagnostic endoscopy seems reasonable.  I am not sure that they will end up being able to resect the polyp but they can at least look at it and if there are any other concerning findings or if his prior esophagitis or gastric ulcers are still in the need of healing this will help [place] better understand issues so I agree with potential endoscopic evaluation diagnostically.  I would not be able to call the patient's POA is I am away until tomorrow.   Justice Britain, MD [facility] Office # [phone]

## 2020-09-19 ENCOUNTER — Telehealth: Payer: Self-pay | Admitting: Gastroenterology

## 2020-09-19 ENCOUNTER — Inpatient Hospital Stay (HOSPITAL_COMMUNITY): Payer: Medicare Other

## 2020-09-19 DIAGNOSIS — I313 Pericardial effusion (noninflammatory): Secondary | ICD-10-CM

## 2020-09-19 LAB — RENAL FUNCTION PANEL
Albumin: 2.8 g/dL — ABNORMAL LOW (ref 3.5–5.0)
Anion gap: 11 (ref 5–15)
BUN: 15 mg/dL (ref 8–23)
CO2: 22 mmol/L (ref 22–32)
Calcium: 9 mg/dL (ref 8.9–10.3)
Chloride: 101 mmol/L (ref 98–111)
Creatinine, Ser: 1.03 mg/dL (ref 0.61–1.24)
GFR, Estimated: >60 mL/min (ref >60)
Glucose, Bld: 124 mg/dL — ABNORMAL HIGH (ref 70–99)
Phosphorus: 3.1 mg/dL (ref 2.5–4.6)
Potassium: 4.1 mmol/L (ref 3.5–5.1)
Sodium: 134 mmol/L — ABNORMAL LOW (ref 135–145)

## 2020-09-19 LAB — CBC
HCT: 28.4 % — ABNORMAL LOW (ref 39.0–52.0)
Hemoglobin: 9.1 g/dL — ABNORMAL LOW (ref 13.0–17.0)
MCH: 27 pg (ref 26.0–34.0)
MCHC: 32 g/dL (ref 30.0–36.0)
MCV: 84.3 fL (ref 80.0–100.0)
Platelets: 323 10*3/uL (ref 150–400)
RBC: 3.37 MIL/uL — ABNORMAL LOW (ref 4.22–5.81)
RDW: 17.1 % — ABNORMAL HIGH (ref 11.5–15.5)
WBC: 7.3 10*3/uL (ref 4.0–10.5)
nRBC: 0 % (ref 0.0–0.2)

## 2020-09-19 LAB — GLUCOSE, CAPILLARY
Glucose-Capillary: 115 mg/dL — ABNORMAL HIGH (ref 70–99)
Glucose-Capillary: 225 mg/dL — ABNORMAL HIGH (ref 70–99)
Glucose-Capillary: 180 mg/dL — ABNORMAL HIGH (ref 70–99)
Glucose-Capillary: 188 mg/dL — ABNORMAL HIGH (ref 70–99)

## 2020-09-19 MED ORDER — METOCLOPRAMIDE HCL 5 MG PO TABS: 5.0000 mg | ORAL_TABLET | Freq: Three times a day (TID) | ORAL | 1 refills | Status: AC

## 2020-09-19 MED ORDER — MAGNESIUM OXIDE 400 MG PO TABS: 400.0000 mg | ORAL_TABLET | Freq: Two times a day (BID) | ORAL | 5 refills | Status: AC

## 2020-09-19 MED ORDER — APIXABAN 5 MG PO TABS: 5.0000 mg | ORAL_TABLET | Freq: Two times a day (BID) | ORAL | 2 refills | Status: DC

## 2020-09-19 MED ORDER — SUPREP BOWEL PREP KIT 17.5-3.13-1.6 GM/177ML PO SOLN: 1.0000 | ORAL | 0 refills | Status: DC

## 2020-09-19 MED ORDER — ELIQUIS DVT/PE STARTER PACK 5 MG PO TBPK: ORAL_TABLET | ORAL | 0 refills | Status: DC

## 2020-09-19 NOTE — Discharge Summary (Signed)
Daniel [name], is a 70 y.o. male  DOB 11-30-1950  MRN [id number].  Admission date:  09/15/2020  Admitting Physician  Bernadette Hoit, DO  Discharge Date:  09/19/2020   Primary MD  Leonie Douglas, MD  Recommendations for primary care physician for things to follow:   1)You are taking Eliquis/Apixaban so please Avoid ibuprofen/Advil/Aleve/Motrin/Goody Powders/Naproxen/BC powders/Meloxicam/Diclofenac/Indomethacin and other Nonsteroidal anti-inflammatory medications as these will make you more likely to bleed and can cause stomach ulcers, can also cause Kidney problems.   2)CBC every Friday and Monday for 2 weeks starting 09/21/20  3)Restart Eliquis/Apixaban on Thursday 09/20/20--- please call if any bleeding concerns  4) please keep your appointment with gastroenterologist Dr. Rush Landmark for colonoscopy as well as EGD with duodenal mass biopsy on November 15, 2020  5)weekly  CMP blood test every Monday for the next 2 weeks advised starting 09/24/2020--- need to monitor calcium levels a  7) please consider lactulose 20 g p.o. x1 as needed anytime there is no bowel movement in 48 hours  Admission Diagnosis  Ileus ([place]) [K56.7] Vomiting [R11.10] Ileus of unspecified type ([place]) [K56.7]   Discharge Diagnosis  Ileus ([place]) [K56.7] Vomiting [R11.10] Ileus of unspecified type ([place]) [K56.7]    Principal Problem:   Ileus ([place]) Active Problems:   Duodenal mass   Rt LE DVT (deep venous thrombosis) (HCC)   Esophagitis   Hypertension   BPH (benign prostatic hyperplasia)   Dementia (HCC)   Hyperglycemia due to diabetes mellitus ([place])   Normocytic anemia   Multiple gastric ulcers   Vomiting   Leukocytosis   Hypoalbuminemia due to protein-calorie malnutrition ([place])   Hyperlipidemia   Pericardial cyst     Past Medical History:  Diagnosis Date  . Acute kidney failure ([place])   . Acute respiratory failure ([place])   .  Adenomatous polyp of stomach   . Anemia   . Benign prostate hyperplasia   . Dementia ([place])   . Depression   . Diabetes mellitus without complication ([place])   . DVT (deep venous thrombosis) ([place])   . Gastric ulcer   . GERD (gastroesophageal reflux disease)   . High cholesterol   . Hypertension   . Hypo-osmolality and hyponatremia   . Insomnia   . Neuromuscular dysfunction of bladder   . Osteoarthritis   . Partial intestinal obstruction ([place])   . Pneumonitis   . Protein-calorie malnutrition, moderate ([place])   . Stroke [facility])     Past Surgical History:  Procedure Laterality Date  . BIOPSY  06/21/2020   Procedure: BIOPSY;  Surgeon: Daneil Dolin, MD;  [facility]: [facility];  Service: Endoscopy;;  gastric  . ESOPHAGOGASTRODUODENOSCOPY (EGD) WITH PROPOFOL N/A 06/21/2020   Procedure: ESOPHAGOGASTRODUODENOSCOPY (EGD) WITH PROPOFOL;  Surgeon: Daneil Dolin, MD;  Location: [facility];  Service: Endoscopy;  Laterality: N/A;  . HIP SURGERY Left   . KIDNEY SURGERY     kidney removed       HPI  from the history and physical done on the day of admission:  Hospital Course:       Brief Summary:- 70 y.o.malewith medical history significant forhypertension, T2DM,dementia, esophagitis, gastric ulcers, DVTand duodenal mass admitted from SNF on 09/15/2020 with concerns for hematemesis  A/p 1)Hematemesis--- no further vomiting here, -Treated with Protonix , okay to discharge on oral Protonix Hgb > 9 -GI consult appreciated-- -EGD from January 2022 showed distal  Moderately severe reflux esophagitis. Multiple gastric ulcers without bleeding stigmata and associated gastric erosions. 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 Jenetta Downer okay to restart Eliquis -We will monitor closely for bleeding--- repeat CBC every Monday and Friday advised for the next couple of weeks  4)Duodenal mass patient had initial consultation with Dr. Rush Landmark on 09/14/2020 -Plan is for EGD with EUS and duodenal mass excision in June 2022 by Dr. Rush Landmark -EGD with biopsy on 09/18/2020 by Dr. 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 -Palliative care practitioner Ms. 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   9) dementia--patient with baseline cognitive and memory deficits, continue Namenda and Aricept  10)Pericardial cyst CT abdomen and pelvis with contrast showedstable 5.6 x 2.4 cm oval area of fluid density adjacent to the posterolateral aspect of the heart on the left, compatible with a pericardial cyst. -CT chest with concerns for pericardial cyst Echo completed on 09/19/2020, report pending    Disposition--back to SNF facility   Disposition: The patient is from: SNF  Anticipated d/c is to: SNF  Anticipated d/c date is:  1 day  Patient currently is not medically stable to d/c. Barriers: Not Clinically Stable-   Code Status : -  Code Status: Full Code   Family Communication:   Legal guardian -I called and updated legal guardian [name] at  [phone]  Consults  :  [place]  Discharge Condition: Stable  Follow UP   Follow-up Information    Leonie Douglas, MD. Schedule an appointment as soon as possible for a visit in 1 week(s).   Specialties: Family Medicine, Sports Medicine Why: CBC every Friday and Monday for 2 weeks starting 09/21/20 Contact information: [address] [place] [address] [phone]               Diet and Activity recommendation:  As advised  Discharge Instructions    Discharge Instructions    Call MD for:  difficulty breathing, headache or visual disturbances   Complete by: As directed    Call MD for:  persistant dizziness or light-headedness   Complete by: As directed    Call MD for:  persistant nausea and vomiting   Complete by: As directed    Call MD for:  severe uncontrolled pain   Complete by: As directed    Call MD for:  temperature >100.4   Complete by: As directed    Diet - low sodium heart healthy   Complete by: As directed    Diet Carb Modified   Complete by: As directed    Discharge instructions   Complete by: As directed    1)You are taking Eliquis/Apixaban so please Avoid ibuprofen/Advil/Aleve/Motrin/Goody Powders/Naproxen/BC powders/Meloxicam/Diclofenac/Indomethacin and other Nonsteroidal anti-inflammatory medications as these will make you more likely to bleed and can cause stomach ulcers, can also cause Kidney problems.   2)CBC every Friday and Monday for 2 weeks starting 09/21/20  3)Restart Eliquis/Apixaban on Thursday 09/20/20--- please call if any bleeding concerns  4) please keep your appointment with gastroenterologist Dr. Rush Landmark for colonoscopy as well as EGD with duodenal mass biopsy on November 15, 2020  5)weekly   CMP blood test every Monday for the next 2 weeks advised starting 09/24/2020--- need to monitor calcium levels   7) please consider lactulose 20 g p.o. x1 as needed anytime there is no bowel movement in 48 hours   Increase activity slowly   Complete by: As directed         Discharge Medications     Allergies as of 09/19/2020   No Known Allergies     Medication List    STOP taking these medications   amoxicillin-clavulanate 875-125 MG tablet Commonly known as: Augmentin   enoxaparin 60 MG/0.6ML injection Commonly known as: LOVENOX     TAKE these medications   acetaminophen 325 MG tablet Commonly known as: TYLENOL Take 2 tablets (650 mg total) by mouth every 6 (six) hours as needed for mild pain, fever or headache (or Fever >/= 101).   aMILoride 5 MG tablet Commonly known as: MIDAMOR Take 1 tablet (5 mg total) by mouth daily.   amitriptyline 10 MG tablet Commonly known as: ELAVIL Take 10 mg by mouth at bedtime.   atorvastatin 40 MG tablet Commonly known as: LIPITOR Take 1 tablet (40 mg total) by mouth daily.   donepezil 10 MG tablet Commonly known as: ARICEPT TAKE (1) TABLET BY MOUTH ONCE DAILY. What changed: See the new instructions.   dronabinol 2.5 MG capsule Commonly known as: MARINOL Take 2.5 mg by mouth 2 (two) times daily before lunch and supper.   Eliquis DVT/PE Starter Pack Generic drug: Apixaban Starter Pack (572m and 519m Take as directed on package: start with two-72m2mablets twice daily for 7 days. On day 8, switch to one-72mg83mblet twice daily. Start taking on: September 20, 2020   apixaban 5 MG Tabs tablet Commonly known as: ELIQUIS Take 1 tablet (5 mg total) by mouth 2 (two) times daily. Start around May 12th 2022  after you finish the initial Eliquis starter pack Start taking on: Oct 18, 2020   feeding supplement Liqd Take 237 mLs by mouth 2 (two) times daily between meals.   FeroSul 325 (65 FE) MG tablet Generic drug: ferrous sulfate TAKE  1 TABLET BY MOUTH TWICE DAILY. What changed:   how much to take  when to take this   folic acid 400 761 tablet Commonly known as: FOLVITE Take 800 mcg by mouth daily.   insulin aspart 100 UNIT/ML FlexPen Commonly known as: NOVOLOG Inject 0-10 Units into the skin 3 (three) times daily with meals. insulin aspart (novoLOG) injection 0-10 Units 0-10 Units Subcutaneous, 3 times daily with meals CBG < 70: Implement Hypoglycemia Standing Orders and refer to Hypoglycemia Standing  Orders sidebar report  CBG 70 - 120: 0 unit CBG 121 - 150: 0 unit  CBG 151 - 200: 1 unit CBG 201 - 250: 2 units CBG 251 - 300: 4 units CBG 301 - 350: 6 units  CBG 351 - 400: 8 units  CBG > 400: 10 units   Lactulose 20 GM/30ML Soln Take 30 mLs (20 g total) by mouth See admin instructions. --Please give lactulose 20 g p.o. x1 if no bowel movement in 48 hours   magnesium oxide 400 MG tablet Commonly known as: MAG-OX Take 1 tablet (400 mg total) by mouth 2 (two) times daily. What changed:   how much to take  when to take this   memantine 10 MG tablet Commonly known as: NAMENDA Take 10 mg by mouth 2 (two) times daily.   metFORMIN 1000 MG tablet Commonly known as: GLUCOPHAGE Take 1 tablet (1,000 mg total) by mouth 2 (two) times daily with a meal.   metoCLOPramide 5 MG tablet Commonly known as: Reglan Take 1 tablet (5 mg total) by mouth 3 (three) times daily. For stomach   metoprolol tartrate 25 MG tablet Commonly known as: LOPRESSOR Take 1 tablet (25 mg total) by mouth 2 (two) times daily.   ondansetron 4 MG tablet Commonly known as: ZOFRAN Take 1 tablet (4 mg total) by mouth every 6 (six) hours as needed for nausea.   oxybutynin 5 MG 24 hr tablet Commonly known as: DITROPAN-XL Take 5 mg by mouth daily.   OXYGEN Inhale 2 L into the lungs daily.   pantoprazole 40 MG tablet Commonly known as: Protonix Take 1 tablet (40 mg total) by mouth daily.   polyethylene glycol 17 g packet Commonly known as:  MiraLax Take 17 g by mouth 2 (two) times daily. For Constipation   senna-docusate 8.6-50 MG tablet Commonly known as: Senokot-S Take 2 tablets by mouth at bedtime.   sucralfate 1 g tablet Commonly known as: Carafate Take 1 tablet (1 g total) by mouth 4 (four) times daily.   Suprep Bowel Prep Kit 17.5-3.13-1.6 GM/177ML Soln Generic drug: Na Sulfate-K Sulfate-Mg Sulf Take 1 kit by mouth as directed. For colonoscopy prep on June 8th 2022 What changed: additional instructions   tamsulosin 0.4 MG Caps capsule Commonly known as: FLOMAX TAKE 1 CAPSULE BY MOUTH AT BEDTIME.   traZODone 100 MG tablet Commonly known as: DESYREL Take 100 mg by mouth at bedtime.   vitamin B-12 500 MCG tablet Commonly known as: CYANOCOBALAMIN Take 1,000 mcg by mouth daily.   Vitamin D3 50 MCG (2000 UT) capsule TAKE 1 CAPSULE BY MOUTH ONCE DAILY. **DO NOT CRUSH** What changed: See the new instructions.      Major procedures and Radiology Reports - PLEASE review detailed and final reports for all details, in brief -   DG Abd 1 View  Result Date: 09/09/2020 CLINICAL DATA:  Gastric distension EXAM: ABDOMEN - 1 VIEW COMPARISON:  September 08, 2020 FINDINGS: Nasogastric tube is seen with its distal tip noted along the expected region of the gastric fundus. Air is seen throughout the large and small bowel with mildly dilated small bowel loops seen within the left lower quadrant. No radio-opaque calculi or other significant radiographic abnormality are seen. 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 IMPRESSION: 1. Nasogastric tube positioning, as described above. 2. Additional findings suggestive of an early partial small bowel obstruction versus ileus. Electronically Signed   By: Virgina Norfolk M.D.   On: 09/09/2020 18:31   DG Abdomen  1 View  Result Date: 09/08/2020 CLINICAL DATA:  Nasogastric tube placement EXAM: ABDOMEN - 1 VIEW COMPARISON:  09/07/2020 FINDINGS: Nasogastric tube tip and side port project within the  stomach. Dilated and gas-filled bowel seen throughout the abdomen. Excreted contrast in the urinary bladder and right renal collecting system. IMPRESSION: Nasogastric tube tip and side port in the stomach. Electronically Signed   By: Ulyses Jarred M.D.   On: 09/08/2020 00:54   CT CHEST W CONTRAST  Result Date: 09/18/2020 CLINICAL DATA:  Abnormal gastric shape.  Possible hernia. Vomiting. EXAM: CT CHEST WITH CONTRAST TECHNIQUE: Multidetector CT imaging of the chest was performed during intravenous contrast administration. CONTRAST:  25m OMNIPAQUE IOHEXOL 300 MG/ML  SOLN COMPARISON:  Abdominopelvic CT 3 days ago FINDINGS: Cardiovascular: Tortuous thoracic aorta. No aortic aneurysm. Mild dilatation of the main pulmonary artery at 3.3 cm. Borderline cardiomegaly. Elongated fluid density structure adjacent to the posterolateral left heart border spans approximately 3.2 cm, better defined on prior abdominal CT, currently obscured by patient motion. This is grossly stable. No significant pericardial effusion Mediastinum/Nodes: No enlarged mediastinal or hilar lymph nodes. Scattered prominent bilateral axillary nodes measuring up to 9 mm on the right and 8 mm on the left. The esophagus is patulous with areas of wall thickening. No definite hiatal hernia. No visualized thyroid nodule. Lungs/Pleura: Significant breathing motion artifact limits assessment. Ground-glass and consolidative airspace disease in the right lower lobe is similar to prior abdominal CT. Few patchy ground-glass opacities in the superior segment of the right lower lobe and adjacent right upper lobe with mild improvement. There is mild improvement in the ground-glass opacity in the left lower lobe. No new or progressive consolidation. No significant pleural effusion. Limited assessment for nodule given motion. Upper Abdomen: Included stomach contains small amount of fluid/ingested material. Gastric anatomy is normal were visualized. No evidence of  gastric hernia. Motion artifact through the upper abdomen. Musculoskeletal: Thoracic spondylosis with multilevel endplate spurring. There are no acute or suspicious osseous abnormalities. IMPRESSION: 1. Patulous esophagus with areas of wall thickening, lower esophagus appears similar to prior abdominal CT. No evidence of gastric or hiatal hernia. The stomach in the included upper abdomen contains a small amount of fluid and is unremarkable. 2. Multifocal airspace opacities, greatest in the right lower lobe. There is overall mild improvement from prior exam. Breathing motion artifact limits detailed assessment. 3. Elongated fluid density structure adjacent to the posterolateral left heart border, better defined on prior abdominal CT, currently obscured by patient motion. This is grossly stable, favor pericardial cyst. 4. Mild dilatation of the main pulmonary artery suggesting pulmonary arterial hypertension. 5. Coronary artery calcifications. Aortic Atherosclerosis (ICD10-I70.0). Electronically Signed   By: MKeith RakeM.D.   On: 09/18/2020 18:52   CT ABDOMEN PELVIS W CONTRAST  Result Date: 09/15/2020 CLINICAL DATA:  Gastrointestinal bleeding. Projectile vomiting with coffee-ground brown emesis. EXAM: CT ABDOMEN AND PELVIS WITH CONTRAST TECHNIQUE: Multidetector CT imaging of the abdomen and pelvis was performed using the standard protocol following bolus administration of intravenous contrast. CONTRAST:  1090mOMNIPAQUE 300 COMPARISON:  09/07/2020 FINDINGS: Lower chest: Stable 5.6 x 2.4 cm oval area of fluid density adjacent to the posterolateral aspect of the heart on the left on image number 14/6. Diffuse low density distal esophageal wall thickening with improvement. Normal sized heart. Atheromatous coronary artery calcifications. Patchy densities in the right lower lobe with some consolidation with mild improvement. Patchy densities in the posterior right upper lobe with mild improvement. Small amount of  interval  dependent patchy density in the left lower lobe. No pleural fluid. Hepatobiliary: No focal liver abnormality is seen. No gallstones, gallbladder wall thickening, or biliary dilatation. Pancreas: Stable normal appearing pancreatic head and proximal neck. 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 Spleen: Normal in size without focal abnormality. Adrenals/Urinary Tract: Normal appearing adrenal glands. Stable right renal cysts and small parenchymal calcification. Stable possible small, severely atrophied left kidney. Unremarkable urinary bladder and right ureter. Stomach/Bowel: Stable dilated stomach and colon. Normal caliber small bowel. No evidence of appendicitis. Vascular/Lymphatic: No significant vascular findings are present. No enlarged abdominal or pelvic lymph nodes. Reproductive: Prostate is unremarkable. Other: Small umbilical hernia containing fat. Musculoskeletal: Stable left hip prosthesis. Lumbar and lower thoracic spine degenerative changes. Stable bilateral L5 pars defects with grade 2 anterolisthesis and vertebral body fusion at the L5-S1 level. IMPRESSION: 1. No acute abnormality. 2. Improving distal esophagitis. 3. Stable dilated stomach and colon, compatible with ileus. No findings to indicate interval gastric outlet obstruction. 4. Mildly improved bilateral lung opacities, compatible with improving pneumonia. 5. Stable 5.6 x 2.4 cm oval area of fluid density adjacent to the posterolateral aspect of the heart on the left, compatible with a pericardial cyst. 6. 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 7. Stable possible small, severely atrophied left kidney. Electronically Signed   By: Claudie Revering M.D.   On: 09/15/2020 21:58   CT ABDOMEN PELVIS W CONTRAST  Result Date: 09/07/2020 CLINICAL DATA:  Nausea and vomiting coffee ground emesis EXAM: CT ABDOMEN AND PELVIS WITH CONTRAST TECHNIQUE:  Multidetector CT imaging of the abdomen and pelvis was performed using the standard protocol following bolus administration of intravenous contrast. CONTRAST:  121m OMNIPAQUE IOHEXOL 300 MG/ML  SOLN COMPARISON:  09/07/2020, CT 08/24/2020, CT 07/27/2020 FINDINGS: Lower chest: Lung bases demonstrate ground-glass density and patchy consolidation in the right lower lobe and posterior right lower lobe. No pleural effusion. Normal cardiac size. Marked circumferential wall thickening of the distal esophagus. Hepatobiliary: No calcified gallstone. No focal hepatic abnormality or biliary dilatation Pancreas: Truncated appearance of pancreas.  No inflammatory change. Spleen: Normal in size without focal abnormality. Adrenals/Urinary Tract: Adrenal glands are normal. No definitive left renal tissue identified, question very diminutive atrophic left kidney, series 3, image number 41. Small cysts in the right kidney. Subcentimeter hypodense lesions too small to further characterize. The bladder is normal. Stomach/Bowel: Marked fluid distension of the stomach. Some fluid-filled nondistended small bowel in the pelvis. Large volume of stool in the colon. Marked air distension of colon in the right upper quadrant, this appears to be sigmoid colon. Slightly narrowed appearing sigmoid colon that traverses horizontally and extends to right upper quadrant, series 3 image number 55 through 65. Considerable stool distal to the loop of dilated sigmoid colon with large feces in the rectum. No acute bowel wall thickening. No corkscrew appearance of mesentery or vasculature in the region of sigmoid colon narrowing. Similar configuration of the colon on 07/27/2020 but more normal course of sigmoid colon on CT from January of 2022. Vascular/Lymphatic: Nonaneurysmal aorta. Aortic atherosclerosis. No suspicious nodes Reproductive: Prostate is unremarkable. Other: Negative for free air or free fluid Musculoskeletal: Chronic pars defects at L5  with 15 mm anterolisthesis L5 on S1 and ankylosis across the L5-S1 disc space. Left hip arthroplasty with artifact. IMPRESSION: 1. Interval marked circumferential wall thickening of the distal esophagus, probably due to esophagitis. Marked fluid distension of the stomach without evidence for small bowel  obstruction, question functional obstruction. Consider NG tube decompression. 2. Generalized distension of the colon with large feces throughout. More focally dilated loop of bowel in the right upper quadrant appears to represent dilated segment of sigmoid colon. Slightly aberrant course of sigmoid colon which appears slightly narrowed at the right lower quadrant with adjacent slightly narrowed segment of rectosigmoid colon, question some degree of colon obstruction, potentially from internal hernia. No corkscrew appearance to suggest a volvulus. Considerable stool within the rectosigmoid colon distal to this suggesting that obstruction may be partial. Note that a similar configuration of the colon was present on CT from 07/27/2020 3. Ground-glass density and patchy consolidation in the right lower lobe and posterior right upper lobe concerning for pneumonia or aspiration. 4. Absent left kidney Aortic Atherosclerosis (ICD10-I70.0). Electronically Signed   By: Donavan Foil M.D.   On: 09/07/2020 22:57   US Venous Img Lower Bilateral (DVT)  Result Date: 09/17/2020 CLINICAL DATA:  History of right lower extremity DVT EXAM: BILATERAL LOWER EXTREMITY VENOUS DOPPLER ULTRASOUND TECHNIQUE: Gray-scale sonography with graded compression, as well as color Doppler and duplex ultrasound were performed to evaluate the lower extremity deep venous systems from the level of the common femoral vein and including the common femoral, femoral, profunda femoral, popliteal and calf veins including the posterior tibial, peroneal and gastrocnemius veins when visible. The superficial great saphenous vein was also interrogated. Spectral  Doppler was utilized to evaluate flow at rest and with distal augmentation maneuvers in the common femoral, femoral and popliteal veins. COMPARISON:  None. FINDINGS: RIGHT LOWER EXTREMITY Common Femoral Vein: Incompletely compressible. Heterogeneous echogenic material present within the lumen consistent with nonocclusive DVT. Partial flow visualized on color Doppler imaging. Saphenofemoral Junction: Nonocclusive thrombus extends into the saphenofemoral junction. Profunda Femoral Vein: No evidence of thrombus. Normal compressibility and flow on color Doppler imaging. Femoral Vein: Nonocclusive thrombus extends into the femoral vein throughout the thigh. Popliteal Vein: Thrombus extends into the popliteal vein where it is occlusive. Calf Veins: Occlusive thrombus extends into the peroneal and posterior tibial veins. Superficial Great Saphenous Vein: No evidence of thrombus. Normal compressibility. Venous Reflux:  None. Other Findings:  None. LEFT LOWER EXTREMITY Common Femoral Vein: No evidence of thrombus. Normal compressibility, respiratory phasicity and response to augmentation. Saphenofemoral Junction: No evidence of thrombus. Normal compressibility and flow on color Doppler imaging. Profunda Femoral Vein: No evidence of thrombus. Normal compressibility and flow on color Doppler imaging. Femoral Vein: No evidence of thrombus. Normal compressibility, respiratory phasicity and response to augmentation. Popliteal Vein: No evidence of thrombus. Normal compressibility, respiratory phasicity and response to augmentation. Calf Veins: No evidence of thrombus. Normal compressibility and flow on color Doppler imaging. Superficial Great Saphenous Vein: No evidence of thrombus. Normal compressibility. Venous Reflux:  None. Other Findings:  None. IMPRESSION: 1. Occlusive thrombus present within the right calf veins and right popliteal vein. Nonocclusive thrombus extends throughout the femoral vein in the thigh and into the  common femoral vein. Given reported history of prior right lower extremity DVT, findings are favored to represent subacute versus acute on chronic DVT. Recommend correlation with prior outside imaging if available. 2. No evidence of DVT in the left lower extremity. Electronically Signed   By: Jacqulynn Cadet M.D.   On: 09/17/2020 12:33   DG Chest Port 1 View  Result Date: 09/15/2020 CLINICAL DATA:  Coffee-ground emesis EXAM: PORTABLE CHEST 1 VIEW COMPARISON:  07/27/2020 FINDINGS: The heart size and mediastinal contours are within normal limits. Both lungs are clear. Unchanged elevation  of the bilateral hemidiaphragms with associated scarring or atelectasis. The visualized skeletal structures are unremarkable. IMPRESSION: No acute abnormality of the lungs in AP portable projection. Electronically Signed   By: Eddie Candle M.D.   On: 09/15/2020 19:14   DG ABD ACUTE 2+V W 1V CHEST  Result Date: 09/07/2020 CLINICAL DATA:  Abdominal distension, emesis and lower extremity swelling EXAM: DG ABDOMEN ACUTE WITH 1 VIEW CHEST COMPARISON:  CT 08/24/2020, MR 06/25/2020, CT 06/19/2020 FINDINGS: There is extensive severe and increasing air distention of what appears to be the colon given some haustra markings although several distended loops of bowel in the low pelvis could reflect some small bowel dilatation as well. A large volume of feculent material is noted within portions of the distended colon as well. No convincing evidence of subdiaphragmatic free air at this time. No suspicious abdominal calcifications. Lung volumes are markedly diminished. Some streaky and patchy opacities in the lung bases may reflect atelectasis, airspace disease or sequela of aspiration in the provided clinical context. Cardiomediastinal contours are unremarkable for the portable upright technique. No pneumothorax. No layering pleural effusion. Telemetry leads overlie the chest and abdomen with nasal cannula the base of the neck. Multilevel  degenerative changes in the shoulders, spine, hips and pelvis with a prior left hip arthroplasty. IMPRESSION: Severe and increasing air distention of what appears to be the colon given some haustra markings although several distended loops of bowel in the low pelvis could reflect some small bowel dilatation as well. Large volume of feculent material within portions of the distended colon as well. Appearance could reflect a colonic obstruction or pseudo-obstruction versus ileus. Electronically Signed   By: Lovena Le M.D.   On: 09/07/2020 20:58   DG Abd Portable 2V  Result Date: 09/10/2020 CLINICAL DATA:  Chronic constipation and bowel obstruction EXAM: PORTABLE ABDOMEN - 2 VIEW COMPARISON:  September 09, 2020 FINDINGS: Supine and upright images obtained. Nasogastric tube tip and side port in stomach. There is moderate stool in the colon. There is mild transverse colonic dilatation with several loops of small bowel upper normal in size. No air-fluid levels. No free air evident. Atelectatic change noted in right lung base. IMPRESSION: Nasogastric tube tip and side port in stomach. Moderate stool in colon. Borderline prominence of bowel loops potentially could indicate enteritis or ileus. Bowel obstruction not felt to be likely. No free air. Atelectatic change right lung base. Electronically Signed   By: Lowella Grip III M.D.   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 Follow-up study. EXAM: CT ABDOMEN WITHOUT AND WITH CONTRAST TECHNIQUE: Multidetector CT imaging of the abdomen was performed following the standard protocol before and following the bolus administration of intravenous contrast. CONTRAST:  172m OMNIPAQUE IOHEXOL 300 MG/ML  SOLN COMPARISON:  CT the abdomen and pelvis 06/19/2020. MRI of the abdomen with MRCP 06/25/2020. FINDINGS: Lower chest: Areas of subsegmental  atelectasis or scarring are noted in the right lung base. Hepatobiliary: No suspicious cystic or solid hepatic lesions. No intra or extrahepatic biliary ductal dilatation. Gallbladder is normal in appearance. Pancreas: No pancreatic mass. No pancreatic ductal dilatation. No pancreatic or peripancreatic fluid collections or inflammatory changes. 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 Spleen: Unremarkable. 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 No surgical clips are noted in the left retroperitoneum. Multiple subcentimeter  low-attenuation lesions in the right kidney, too small to definitively characterize, but likely to represent cysts based on their appearance on prior abdominal MRI. Small nonobstructive calculi are present within the right renal collecting system measuring up to 5 mm in the interpolar region. No suspicious right renal lesions. No right hydroureteronephrosis in the visualized portions of the abdomen. Bilateral adrenal glands are normal in appearance. Stomach/Bowel: Normal appearance of the stomach. No pathologic dilatation of the visualized portions of small bowel or colon. Vascular/Lymphatic: No aneurysm identified in the visualized abdominal vasculature. No lymphadenopathy noted in the abdomen. Other: Small umbilical hernia containing only omental fat. No significant volume of ascites and no pneumoperitoneum noted in the visualized portions of the peritoneal cavity. Musculoskeletal: No aggressive appearing osseous lesions are noted in the visualized portions of the skeleton. Status post left hip arthroplasty. IMPRESSION: 1. No pancreatic ductal dilatation. 2. No acute findings in the abdomen. 3. Incidental findings, as above. Electronically Signed   By: Vinnie Langton M.D.   On: 08/25/2020 06:34    Micro Results  Recent Results (from the past 240 hour(s))  Resp Panel by RT-PCR (Flu A&B, Covid)  Nasopharyngeal Swab     Status: None   Collection Time: 09/12/20  4:06 PM   Specimen: Nasopharyngeal Swab; Nasopharyngeal(NP) swabs in vial transport medium  Result Value Ref Range Status   SARS Coronavirus 2 by RT PCR NEGATIVE NEGATIVE Final    Comment: (NOTE) SARS-CoV-2 target nucleic acids are NOT DETECTED.  The SARS-CoV-2 RNA is generally detectable in upper respiratory specimens during the acute phase of infection. The lowest concentration of SARS-CoV-2 viral copies this assay can detect is 138 copies/mL. A negative result does not preclude SARS-Cov-2 infection and should not be used as the sole basis for treatment or other patient management decisions. A negative result may occur with  improper specimen collection/handling, submission of specimen other than nasopharyngeal swab, presence of viral mutation(s) within the areas targeted by this assay, and inadequate number of viral copies(<138 copies/mL). A negative result must be combined with clinical observations, patient history, and epidemiological information. The expected result is Negative.  Fact Sheet for Patients:  EntrepreneurPulse.com.au  Fact Sheet for Healthcare Providers:  IncredibleEmployment.be  This test is no t yet approved or cleared by the [place] FDA and  has been authorized for detection and/or diagnosis of SARS-CoV-2 by FDA under an Emergency Use Authorization (EUA). This EUA will remain  in effect (meaning this test can be used) for the duration of the COVID-19 declaration under Section 564(b)(1) of the Act, 21 U.S.C.section 360bbb-3(b)(1), unless the authorization is terminated  or revoked sooner.       Influenza A by PCR NEGATIVE NEGATIVE Final   Influenza B by PCR NEGATIVE NEGATIVE Final    Comment: (NOTE) The Xpert Xpress SARS-CoV-2/FLU/RSV plus assay is intended as an aid in the diagnosis of influenza from Nasopharyngeal swab specimens and should not be  used as a sole basis for treatment. Nasal washings and aspirates are unacceptable for Xpert Xpress SARS-CoV-2/FLU/RSV testing.  Fact Sheet for Patients: EntrepreneurPulse.com.au  Fact Sheet for Healthcare Providers: IncredibleEmployment.be  This test is not yet approved or cleared by the [place] FDA and has been authorized for detection and/or diagnosis of SARS-CoV-2 by FDA under an Emergency Use Authorization (EUA). This EUA will remain in effect (meaning this test can be used) for the duration of the COVID-19 declaration under Section 564(b)(1) of the Act, 21 U.S.C. section 360bbb-3(b)(1), unless the authorization is terminated or  revoked.  Performed at [facility], [address], [place], [place] [address]   SARS CORONAVIRUS 2 (TAT 6-24 HRS) Nasopharyngeal Nasopharyngeal Swab     Status: None   Collection Time: 09/15/20 11:35 PM   Specimen: Nasopharyngeal Swab  Result Value Ref Range Status   SARS Coronavirus 2 NEGATIVE NEGATIVE Final    Comment: (NOTE) SARS-CoV-2 target nucleic acids are NOT DETECTED.  The SARS-CoV-2 RNA is generally detectable in upper and lower respiratory specimens during the acute phase of infection. Negative results do not preclude SARS-CoV-2 infection, do not rule out co-infections with other pathogens, and should not be used as the sole basis for treatment or other patient management decisions. Negative results must be combined with clinical observations, patient history, and epidemiological information. The expected result is Negative.  Fact Sheet for Patients: SugarRoll.be  Fact Sheet for Healthcare Providers: https://www.woods-mathews.com/  This test is not yet approved or cleared by the [place] FDA and  has been authorized for detection and/or diagnosis of SARS-CoV-2 by FDA under an Emergency Use Authorization (EUA). This EUA will remain  in effect  (meaning this test can be used) for the duration of the COVID-19 declaration under Se ction 564(b)(1) of the Act, 21 U.S.C. section 360bbb-3(b)(1), unless the authorization is terminated or revoked sooner.  Performed at [facility], [place] [address], [place], [place] [address]     Today   Subjective    Daniel [name] today has no new concerns  No fever  Or chills  -Eating and drinking well no further emesis  I called and updated legal guardian [name] at  [phone]  Patient has been seen and examined prior to discharge   Objective   Blood pressure 105/76, pulse 82, temperature (!) 97.5 F (36.4 C), temperature source Oral, resp. rate 20, height 5' 8"  (1.727 m), weight 66.5 kg, SpO2 100 %.   Intake/Output Summary (Last 24 hours) at 09/19/2020 1526 Last data filed at 09/19/2020 1300 Gross per 24 hour  Intake 720 ml  Output 800 ml  Net -80 ml    Exam Gen:- Awake Alert, no acute distress HEENT:-Right eye vision loss, Neck-Supple Neck,No JVD,.  Lungs- CTAB , good air movement bilaterally  CV- S1, S2 normal, regular Abd- +ve B.Sounds, Abd Soft, No tenderness,  Extremity/Skin:-No significant lower extremity swelling or tenderness,good pulses Psych-affect isflat, not very talkative  neuro-generalized weakness,no new focal deficits, no tremors    Data Review   CBC w Diff:  Lab Results  Component Value Date   WBC 7.3 09/19/2020   HGB 9.1 (L) 09/19/2020   HCT 28.4 (L) 09/19/2020   PLT 323 09/19/2020   LYMPHOPCT 4 09/15/2020   MONOPCT 5 09/15/2020   EOSPCT 0 09/15/2020   BASOPCT 0 09/15/2020    CMP:  Lab Results  Component Value Date   NA 134 (L) 09/19/2020   K 4.1 09/19/2020   CL 101 09/19/2020   CO2 22 09/19/2020   BUN 15 09/19/2020   CREATININE 1.03 09/19/2020   CREATININE 1.29 (H) 12/31/2016   PROT 5.8 (L) 09/16/2020   ALBUMIN 2.8 (L) 09/19/2020   BILITOT 0.7 09/16/2020   ALKPHOS 82 09/16/2020   AST 15 09/16/2020   ALT 25 09/16/2020   .   Total Discharge time is about 33 minutes  Roxan Hockey M.D on 09/19/2020 at 3:26 PM  Go to [email or web address] -  for contact info  [facility] - Office  [phone]

## 2020-09-19 NOTE — Telephone Encounter (Signed)
Forwarded to [employer] to schedule

## 2020-09-19 NOTE — TOC Transition Note (Signed)
Transition of Care [facility]) - CM/SW Discharge Note   Patient Details  Name: Daniel [name] MRN: [id number] Date of Birth: May 06, 1951  [facility] CM/SW Contact:  [name], LCSW Phone Number: 09/19/2020, 11:29 AM   Clinical Narrative:    CSW notified of patient's readiness for discharge. Ebony Hail with [facility] agreeable to take patient. CSW faxed discharge summary and transfer summary. CSW completed med necessity and called EMS. Nurse to call report. TOC signing off.  Final next level of care: Skilled Nursing Facility Barriers to Discharge: Barriers Resolved   Patient Goals and CMS Choice Patient states their goals for this hospitalization and ongoing recovery are:: Return to SNF CMS Medicare.gov Compare Post Acute Care list provided to:: Patient Choice offered to / list presented to : Patient  Discharge Placement              Patient chooses bed at: [facility] Patient to be transferred to facility by: [facility] Name of family member notified: [name] (Legal Guardian) Patient and family notified of of transfer: 09/19/20  Discharge Plan and Services In-house Referral: Clinical Social Work   Post Acute Care Choice: Resumption of Svcs/PTA Provider          DME Arranged: N/A DME Agency: NA       HH Arranged: NA [place] Agency: NA        Social Determinants of Health (SDOH) Interventions     Readmission Risk Interventions Readmission Risk Prevention Plan 09/17/2020 09/10/2020 06/21/2020  Transportation Screening Complete Complete Complete  PCP or Specialist Appt within 5-7 Days - - -  Home Care Screening - - -  Medication Review (RN CM) - - -  [place] or [place] - Complete Complete  Social Work Consult for [place] Planning/Counseling - Complete Complete  Palliative Care Screening - - Not Applicable  Medication Review Press photographer) Complete Complete Complete  SW Recovery Care/Counseling Consult Complete - -  [place] Complete - -  Some recent data might be hidden

## 2020-09-19 NOTE — Discharge Instructions (Signed)
1)You are taking Eliquis/Apixaban so please Avoid ibuprofen/Advil/Aleve/Motrin/Goody Powders/Naproxen/BC powders/Meloxicam/Diclofenac/Indomethacin and other Nonsteroidal anti-inflammatory medications as these will make you more likely to bleed and can cause stomach ulcers, can also cause Kidney problems.   2)CBC every Friday and Monday for 2 weeks starting 09/21/20  3)Restart Eliquis/Apixaban on Thursday 09/20/20--- please call if any bleeding concerns  4) please keep your appointment with gastroenterologist Dr. Rush Landmark for colonoscopy as well as EGD with duodenal mass biopsy on November 15, 2020  5)weekly  CMP blood test every Monday for the next 2 weeks advised starting 09/24/2020--- need to monitor calcium levels a  7) please consider lactulose 20 g p.o. x1 as needed anytime there is no bowel movement in 48 hours

## 2020-09-19 NOTE — Progress Notes (Signed)
*  PRELIMINARY RESULTS* Echocardiogram 2D Echocardiogram has been performed.  Daniel [name] 09/19/2020, 11:55 AM

## 2020-09-19 NOTE — Telephone Encounter (Signed)
Patient is being discharged from the hospital today. He need hospital follow-up with Dr. Jenetta Downer in 2-4 weeks. Dx: Anemia, hematemesis, ileus.  He will also repeat CBCs. Per hospitalist, they are requesting facility to check CBC every Friday and Monday x2 weeks. Will defer to Dr. Jenetta Downer for any additional recommendations.   Routine to Dr. Jenetta Downer as Juluis Rainier

## 2020-09-19 NOTE — Telephone Encounter (Signed)
Thanks Kristen 

## 2020-09-19 NOTE — Plan of Care (Signed)

## 2020-09-20 ENCOUNTER — Encounter (HOSPITAL_COMMUNITY): Payer: Self-pay | Admitting: Gastroenterology

## 2020-09-20 LAB — SURGICAL PATHOLOGY

## 2020-09-27 NOTE — Telephone Encounter (Signed)
Thanks for the update. Please keep me up-to-date and put a note or message for [place] to review this Doppler study the week before. GM

## 2020-09-27 NOTE — Telephone Encounter (Signed)
FYI- Note returned to office from Dr. Moshe Cipro- Per his note will repeat Doppler 1 week prior to EGD to see if Eliquis can be held x2 days prior to procedure.

## 2020-09-28 NOTE — Telephone Encounter (Signed)
Staff message sent to [place] as a reminder.

## 2020-10-02 ENCOUNTER — Encounter (INDEPENDENT_AMBULATORY_CARE_PROVIDER_SITE_OTHER): Payer: Self-pay | Admitting: Gastroenterology

## 2020-10-02 ENCOUNTER — Ambulatory Visit (INDEPENDENT_AMBULATORY_CARE_PROVIDER_SITE_OTHER): Payer: Medicare Other | Admitting: Gastroenterology

## 2020-10-02 ENCOUNTER — Other Ambulatory Visit: Payer: Self-pay

## 2020-10-02 VITALS — BP 103/67 | HR 102 | Temp 99.0°F | Ht 71.0 in

## 2020-10-02 DIAGNOSIS — K567 Ileus, unspecified: Secondary | ICD-10-CM | POA: Diagnosis not present

## 2020-10-02 DIAGNOSIS — D649 Anemia, unspecified: Secondary | ICD-10-CM | POA: Diagnosis not present

## 2020-10-02 DIAGNOSIS — K9189 Other postprocedural complications and disorders of digestive system: Secondary | ICD-10-CM | POA: Insufficient documentation

## 2020-10-02 MED ORDER — PYRIDOSTIGMINE BROMIDE 60 MG PO TABS: 60.0000 mg | ORAL_TABLET | Freq: Three times a day (TID) | ORAL | 3 refills | Status: AC

## 2020-10-02 NOTE — Patient Instructions (Signed)
-   Will need to check iron, ferritin, iron stores,  - Check CBC weekly - Start Mestinon 60 mg TID - Hematology evaluation

## 2020-10-02 NOTE — Progress Notes (Signed)
Daniel [name], M.D. Gastroenterology & Hepatology [facility] [address] [place], [place] [address]  Primary Care Physician: Leonie Douglas, MD [address] [place] [address]  I will communicate my assessment and recommendations to the referring MD via EMR.  Problems: 1. Gastric ulcers with upper gastrointestinal bleeding - resolved 2. Severe esophagitis - resolved 3. Iron deficiency anemia 4. 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 Gastric ileus   History of Present Illness: Daniel [name] is a 70 y.o. male with past medical history of diabetes, hyperlipidemia, hypertension, history of GI bleed secondary to severe esophagitis, recurrent gastric issues, cognitive impairment and CVA, who presents for follow up after hospitalization after presenting recurrent gastric ileus.    The patient Was hospitalized in early April at [facility] after he presented with urine coffee-ground emesis and suspected ileus.  CT of the abdomen and pelvis with IV contrast showed stable dilated stomach and colon suggestive of ileus.  He had a drop in his hemoglobin down to 8.4 during admission.  He was also found to have a DVT and was started on anticoagulation.  Due to these alterations, he underwent esophagogastroduodenospy on 09/18/2020 which was negative for any active bleeding.  In fact, his gastric ulcer had already healed completely but there was presence of deformity of the gastric body.  A 2.5 x 3 cm multilobulated mass was found in the second portion of the duodenum, with biopsies consistent with a tubular adenoma.  Patient underwent a CT of the chest to rule out paraesophageal hernia but this was negative for this alteration.  Finally, the patient was pulled on A laxative regimen as he was found to have significant constipation.  The patient's guardian comes today to the clinic office.  She reports that the patient is not feeling  well today and his symptoms have recurred since the last time he was in the hospital.  She reports that the patient has vomited twice this week, with normal color of his vomit.  She reports that he was recently found to have a drop in his hemoglobin down to 7.7 this week.  He has been moving his bowels as far as she knows, he is currently taking lactulose every 48 h, Miralax daily and Senna-docusate.Overall, she reports the patient has felt weaker but denies having any chest pain at the moment. The patient denies having any  fever, chills, hematochezia, melena, hematemesis, abdominal distention, abdominal pain, diarrhea, jaundice, pruritus.   Notably, the guardian reports that since January after he had COVID, he has been on oxygen.  She has noted a progressive decline in his health overall since June 2021 after he had his stroke episode.  Last Colonoscopy: never, but is scheduled to have this performed with Dr. Rush Landmark.  Past Medical History: Past Medical History:  Diagnosis Date  . Acute kidney failure ([place])   . Acute respiratory failure ([place])   . Adenomatous polyp of stomach   . Anemia   . Benign prostate hyperplasia   . Dementia ([place])   . Depression   . Diabetes mellitus without complication ([place])   . DVT (deep venous thrombosis) ([place])   . Gastric ulcer   . GERD (gastroesophageal reflux disease)   . High cholesterol   . Hypertension   . Hypo-osmolality and hyponatremia   . Insomnia   . Neuromuscular dysfunction of bladder   . Osteoarthritis   . Partial intestinal obstruction ([place])   . Pneumonitis   .  Protein-calorie malnutrition, moderate ([place])   . Stroke [facility])     Past Surgical History: Past Surgical History:  Procedure Laterality Date  . BIOPSY  06/21/2020   Procedure: BIOPSY;  Surgeon: Daneil Dolin, MD;  Location[facility];  Service: Endoscopy;;  gastric  . BIOPSY  09/18/2020   Procedure: BIOPSY;  Surgeon: Montez Morita, [name], MD;  [facility];   Service: Gastroenterology;;  . ESOPHAGOGASTRODUODENOSCOPY (EGD) WITH PROPOFOL N/A 06/21/2020   Procedure: ESOPHAGOGASTRODUODENOSCOPY (EGD) WITH PROPOFOL;  Surgeon: Daneil Dolin, MD;  [facility];  Service: Endoscopy;  Laterality: N/A;  . ESOPHAGOGASTRODUODENOSCOPY (EGD) WITH PROPOFOL N/A 09/18/2020   Procedure: ESOPHAGOGASTRODUODENOSCOPY (EGD) WITH PROPOFOL;  Surgeon: Harvel Quale, MD;  Location[facility];  Service: Gastroenterology;  Laterality: N/A;  . HIP SURGERY Left   . KIDNEY SURGERY     kidney removed    Family History: Family History  Problem Relation Age of Onset  . Hypertension Sister   . Hyperlipidemia Sister   . Osteoarthritis Sister   . Rheum arthritis Sister   . Breast cancer Niece   . Colon cancer Neg Hx   . Esophageal cancer Neg Hx   . Inflammatory bowel disease Neg Hx   . Liver disease Neg Hx   . Pancreatic cancer Neg Hx   . Stomach cancer Neg Hx   . Rectal cancer Neg Hx     Social History: Social History   Tobacco Use  Smoking Status Never Smoker  Smokeless Tobacco Never Used   Social History   Substance and Sexual Activity  Alcohol Use No   Social History   Substance and Sexual Activity  Drug Use No    Allergies: No Known Allergies  Medications: Current Outpatient Medications  Medication Sig Dispense Refill  . acetaminophen (TYLENOL) 325 MG tablet Take 2 tablets (650 mg total) by mouth every 6 (six) hours as needed for mild pain, fever or headache (or Fever >/= 101). 12 tablet 0  . aMILoride (MIDAMOR) 5 MG tablet Take 1 tablet (5 mg total) by mouth daily. 30 tablet 5  . amitriptyline (ELAVIL) 10 MG tablet Take 10 mg by mouth at bedtime.    Derrill Memo ON 10/18/2020] apixaban (ELIQUIS) 5 MG TABS tablet Take 1 tablet (5 mg total) by mouth 2 (two) times daily. Start around May 12th 2022  after you finish the initial Eliquis starter pack 60 tablet 2  . atorvastatin (LIPITOR) 40 MG tablet Take 1 tablet (40 mg total) by  mouth daily. 30 tablet 4  . donepezil (ARICEPT) 10 MG tablet TAKE (1) TABLET BY MOUTH ONCE DAILY. (Patient taking differently: Take 10 mg by mouth daily.) 30 tablet PRN  . dronabinol (MARINOL) 2.5 MG capsule Take 2.5 mg by mouth 2 (two) times daily before lunch and supper.    . feeding supplement (ENSURE ENLIVE / ENSURE PLUS) LIQD Take 237 mLs by mouth 2 (two) times daily between meals. 237 mL 12  . FEROSUL 325 (65 Fe) MG tablet TAKE 1 TABLET BY MOUTH TWICE DAILY. (Patient taking differently: Take 325 mg by mouth in the morning and at bedtime.) 009 tablet 3  . folic acid (FOLVITE) 381 MCG tablet Take 800 mcg by mouth daily.    . insulin aspart (NOVOLOG) 100 UNIT/ML FlexPen Inject 0-10 Units into the skin 3 (three) times daily with meals. insulin aspart (novoLOG) injection 0-10 Units 0-10 Units Subcutaneous, 3 times daily with meals CBG < 70: Implement Hypoglycemia Standing Orders and refer  to Hypoglycemia Standing Orders sidebar report  CBG 70 - 120: 0 unit CBG 121 - 150: 0 unit  CBG 151 - 200: 1 unit CBG 201 - 250: 2 units CBG 251 - 300: 4 units CBG 301 - 350: 6 units  CBG 351 - 400: 8 units  CBG > 400: 10 units 15 mL 11  . Lactulose 20 GM/30ML SOLN Take 30 mLs (20 g total) by mouth See admin instructions. --Please give lactulose 20 g p.o. x1 if no bowel movement in 48 hours 236 mL 0  . magnesium oxide (MAG-OX) 400 MG tablet Take 1 tablet (400 mg total) by mouth 2 (two) times daily. 180 tablet 5  . memantine (NAMENDA) 10 MG tablet Take 10 mg by mouth 2 (two) times daily.    . metFORMIN (GLUCOPHAGE) 1000 MG tablet Take 1 tablet (1,000 mg total) by mouth 2 (two) times daily with a meal. 60 tablet 3  . metoCLOPramide (REGLAN) 5 MG tablet Take 1 tablet (5 mg total) by mouth 3 (three) times daily. For stomach 90 tablet 1  . metoprolol tartrate (LOPRESSOR) 25 MG tablet Take 1 tablet (25 mg total) by mouth 2 (two) times daily. 60 tablet 3  . ondansetron (ZOFRAN) 4 MG tablet Take 1 tablet (4 mg total) by mouth  every 6 (six) hours as needed for nausea. 20 tablet 0  . OVER THE COUNTER MEDICATION Vit D 3 one tablet po daily.    Marland Kitchen oxybutynin (DITROPAN-XL) 5 MG 24 hr tablet Take 5 mg by mouth daily.    . OXYGEN Inhale 2 L into the lungs daily.    . pantoprazole (PROTONIX) 40 MG tablet Take 1 tablet (40 mg total) by mouth daily. 90 tablet 3  . polyethylene glycol (MIRALAX) 17 g packet Take 17 g by mouth 2 (two) times daily. For Constipation 60 each 2  . senna-docusate (SENOKOT-S) 8.6-50 MG tablet Take 2 tablets by mouth at bedtime. 60 tablet 3  . sucralfate (CARAFATE) 1 g tablet Take 1 tablet (1 g total) by mouth 4 (four) times daily. 120 tablet 1  . tamsulosin (FLOMAX) 0.4 MG CAPS capsule TAKE 1 CAPSULE BY MOUTH AT BEDTIME. (Patient taking differently: Take 0.4 mg by mouth at bedtime.) 90 capsule 1  . traZODone (DESYREL) 100 MG tablet Take 100 mg by mouth at bedtime.    . vitamin B-12 (CYANOCOBALAMIN) 500 MCG tablet Take 1,000 mcg by mouth daily.    . Cholecalciferol (VITAMIN D3) 2000 units capsule TAKE 1 CAPSULE BY MOUTH ONCE DAILY. **DO NOT CRUSH** (Patient taking differently: Take 2,000 Units by mouth daily.) 30 capsule 5   No current facility-administered medications for this visit.    Review of Systems: GENERAL: negative for malaise, night sweats HEENT: No changes in hearing or vision, no nose bleeds or other nasal problems. NECK: Negative for lumps, goiter, pain and significant neck swelling RESPIRATORY: Negative for cough, wheezing CARDIOVASCULAR: Negative for chest pain, leg swelling, palpitations, orthopnea GI: SEE HPI MUSCULOSKELETAL: Negative for joint pain or swelling, back pain, and muscle pain. SKIN: Negative for lesions, rash PSYCH: Negative for sleep disturbance, mood disorder and recent psychosocial stressors. HEMATOLOGY Negative for prolonged bleeding, bruising easily, and swollen nodes. ENDOCRINE: Negative for cold or heat intolerance, polyuria, polydipsia and goiter. NEURO:  negative for tremor, gait imbalance, syncope and seizures. The remainder of the review of systems is noncontributory.   Physical Exam: BP 103/67 (BP Location: Left Arm, Patient Position: Sitting, Cuff Size: Small)   Pulse (!) 102  Temp 99 F (37.2 C) (Oral)   Ht 5\' 11"  (1.803 m)   BMI 20.45 kg/m  GENERAL: The patient is AO x3, in no acute distress. Sititng in wheelchair. Looks fatigued. Uses [place]. HEENT: Head is normocephalic and atraumatic. EOMI are intact. Mouth is well hydrated and without lesions. NECK: Supple. No masses LUNGS: Clear to auscultation. No presence of rhonchi/wheezing/rales. Adequate chest expansion HEART: RRR, normal s1 and s2. ABDOMEN: Soft, nontender, no guarding, no peritoneal signs. Abdomen is diffusely tympanic. BS +. No masses. EXTREMITIES: Without any cyanosis, clubbing, rash, lesions or edema. NEUROLOGIC: AOx3, no focal motor deficit. SKIN: no jaundice, no rashes  Imaging/Labs: as above  I personally reviewed and interpreted the available labs, imaging and endoscopic files.  Impression and Plan: Daniel [name] is a 70 y.o. male with past medical history of diabetes, hyperlipidemia, hypertension, history of GI bleed secondary to severe esophagitis, recurrent gastric issues, cognitive impairment and CVA, who presents for follow up after hospitalization after presenting recurrent gastric ileus.  Patient has presented symptoms concerning for recurrent episode of gastric ileus.  He has not presented obstruction that was seen in recent CT of the abdomen and endoscopic evaluation.  I explained to the guardian that given his poor mobility this may have worsened his underlying gastric ileus.  I will start him on Mestinon 60 mg 3 times daily in order to improve his symptoms, but if he has worsening nausea and vomiting and absence of bowel movements, will need to be seen in the ER for further management.  I explained to her that overall, his quality of life has been  declining after his stroke and consideration for palliative care evaluation should be held to provide more comfort to his symptoms.  In terms of his anemia, he has not presented any overt episodes of gastrointestinal bleeding but has presented worsening hemoglobin recently.  This should be checked every week at his facility which I instructed them to do.  It would be important to characterize the anemia further by checking his iron stores as I am not convinced this is only related to iron deficiency anemia since his iron stores were normal in January 2022.  He may also want to have an evaluation by hematology.  Finally, he will follow with Dr. Richarda Osmond regarding resection of his duodenal mass and colonoscopy.  - Will need to check iron, ferritin, iron stores,  - Check CBC weekly - Start Mestinon 60 mg TID - Hematology evaluation   All questions were answered.      Harvel Quale, MD Gastroenterology and Hepatology [facility]

## 2020-10-11 ENCOUNTER — Inpatient Hospital Stay (HOSPITAL_COMMUNITY): Payer: Medicare Other

## 2020-10-11 ENCOUNTER — Inpatient Hospital Stay (HOSPITAL_COMMUNITY): Payer: Medicare Other | Attending: Hematology | Admitting: Hematology

## 2020-10-11 ENCOUNTER — Other Ambulatory Visit: Payer: Self-pay

## 2020-10-11 VITALS — BP 110/63 | HR 75 | Temp 97.2°F | Resp 20

## 2020-10-11 DIAGNOSIS — K209 Esophagitis, unspecified without bleeding: Secondary | ICD-10-CM

## 2020-10-11 DIAGNOSIS — Z8711 Personal history of peptic ulcer disease: Secondary | ICD-10-CM | POA: Diagnosis not present

## 2020-10-11 DIAGNOSIS — Z7982 Long term (current) use of aspirin: Secondary | ICD-10-CM | POA: Diagnosis not present

## 2020-10-11 DIAGNOSIS — Z8349 Family history of other endocrine, nutritional and metabolic diseases: Secondary | ICD-10-CM | POA: Insufficient documentation

## 2020-10-11 DIAGNOSIS — D500 Iron deficiency anemia secondary to blood loss (chronic): Secondary | ICD-10-CM

## 2020-10-11 DIAGNOSIS — D649 Anemia, unspecified: Secondary | ICD-10-CM | POA: Insufficient documentation

## 2020-10-11 DIAGNOSIS — Z79899 Other long term (current) drug therapy: Secondary | ICD-10-CM | POA: Diagnosis not present

## 2020-10-11 DIAGNOSIS — Z8261 Family history of arthritis: Secondary | ICD-10-CM | POA: Diagnosis not present

## 2020-10-11 DIAGNOSIS — I10 Essential (primary) hypertension: Secondary | ICD-10-CM | POA: Diagnosis not present

## 2020-10-11 DIAGNOSIS — Z86718 Personal history of other venous thrombosis and embolism: Secondary | ICD-10-CM | POA: Insufficient documentation

## 2020-10-11 DIAGNOSIS — Z7901 Long term (current) use of anticoagulants: Secondary | ICD-10-CM | POA: Diagnosis not present

## 2020-10-11 DIAGNOSIS — Z803 Family history of malignant neoplasm of breast: Secondary | ICD-10-CM | POA: Diagnosis not present

## 2020-10-11 DIAGNOSIS — Z8249 Family history of ischemic heart disease and other diseases of the circulatory system: Secondary | ICD-10-CM | POA: Diagnosis not present

## 2020-10-11 DIAGNOSIS — Z5111 Encounter for antineoplastic chemotherapy: Secondary | ICD-10-CM

## 2020-10-11 DIAGNOSIS — E875 Hyperkalemia: Secondary | ICD-10-CM | POA: Insufficient documentation

## 2020-10-11 DIAGNOSIS — K922 Gastrointestinal hemorrhage, unspecified: Secondary | ICD-10-CM

## 2020-10-11 DIAGNOSIS — E119 Type 2 diabetes mellitus without complications: Secondary | ICD-10-CM | POA: Insufficient documentation

## 2020-10-11 DIAGNOSIS — E440 Moderate protein-calorie malnutrition: Secondary | ICD-10-CM

## 2020-10-11 LAB — CBC WITH DIFFERENTIAL/PLATELET
Abs Immature Granulocytes: 0.01 10*3/uL (ref 0.00–0.07)
Basophils Absolute: 0 10*3/uL (ref 0.0–0.1)
Basophils Relative: 1 %
Eosinophils Absolute: 0.1 10*3/uL (ref 0.0–0.5)
Eosinophils Relative: 2 %
HCT: 34.8 % — ABNORMAL LOW (ref 39.0–52.0)
Hemoglobin: 10.4 g/dL — ABNORMAL LOW (ref 13.0–17.0)
Immature Granulocytes: 0 %
Lymphocytes Relative: 23 %
Lymphs Abs: 0.9 10*3/uL (ref 0.7–4.0)
MCH: 26.9 pg (ref 26.0–34.0)
MCHC: 29.9 g/dL — ABNORMAL LOW (ref 30.0–36.0)
MCV: 90.2 fL (ref 80.0–100.0)
Monocytes Absolute: 0.4 10*3/uL (ref 0.1–1.0)
Monocytes Relative: 10 %
Neutro Abs: 2.6 10*3/uL (ref 1.7–7.7)
Neutrophils Relative %: 64 %
Platelets: 279 10*3/uL (ref 150–400)
RBC: 3.86 MIL/uL — ABNORMAL LOW (ref 4.22–5.81)
RDW: 17.2 % — ABNORMAL HIGH (ref 11.5–15.5)
WBC: 4.1 10*3/uL (ref 4.0–10.5)
nRBC: 0 % (ref 0.0–0.2)

## 2020-10-11 LAB — COMPREHENSIVE METABOLIC PANEL
ALT: 14 U/L (ref 0–44)
AST: 12 U/L — ABNORMAL LOW (ref 15–41)
Albumin: 3.3 g/dL — ABNORMAL LOW (ref 3.5–5.0)
Alkaline Phosphatase: 70 U/L (ref 38–126)
Anion gap: 8 (ref 5–15)
BUN: 19 mg/dL (ref 8–23)
CO2: 25 mmol/L (ref 22–32)
Calcium: 10.9 mg/dL — ABNORMAL HIGH (ref 8.9–10.3)
Chloride: 97 mmol/L — ABNORMAL LOW (ref 98–111)
Creatinine, Ser: 0.99 mg/dL (ref 0.61–1.24)
GFR, Estimated: >60 mL/min (ref >60)
Glucose, Bld: 91 mg/dL (ref 70–99)
Potassium: 5.3 mmol/L — ABNORMAL HIGH (ref 3.5–5.1)
Sodium: 130 mmol/L — ABNORMAL LOW (ref 135–145)
Total Bilirubin: 0.4 mg/dL (ref 0.3–1.2)
Total Protein: 6.7 g/dL (ref 6.5–8.1)

## 2020-10-11 LAB — IRON AND TIBC
Iron: 36 ug/dL — ABNORMAL LOW (ref 45–182)
Saturation Ratios: 11 % — ABNORMAL LOW (ref 17.9–39.5)
TIBC: 323 ug/dL (ref 250–450)
UIBC: 287 ug/dL

## 2020-10-11 LAB — VITAMIN B12: Vitamin B-12: 591 pg/mL (ref 180–914)

## 2020-10-11 LAB — RETICULOCYTES
Immature Retic Fract: 23.8 % — ABNORMAL HIGH (ref 2.3–15.9)
RBC.: 3.8 MIL/uL — ABNORMAL LOW (ref 4.22–5.81)
Retic Count, Absolute: 70.3 10*3/uL (ref 19.0–186.0)
Retic Ct Pct: 1.9 % (ref 0.4–3.1)

## 2020-10-11 LAB — LACTATE DEHYDROGENASE: LDH: 105 U/L (ref 98–192)

## 2020-10-11 LAB — FOLATE: Folate: 45 ng/mL (ref >5.9)

## 2020-10-11 LAB — FERRITIN: Ferritin: 37 ng/mL (ref 24–336)

## 2020-10-11 NOTE — Progress Notes (Signed)
[facility] [address] [place] [address]   CLINIC:  Medical Oncology/Hematology  Patient Care Team: Leonie Douglas, MD as PCP - General (Family Medicine) Cleda Mccreedy, MD (Internal Medicine) Eloise Harman, DO as Consulting Physician (Internal Medicine) Brien Mates, RN as Oncology Nurse Navigator (Oncology)  CHIEF COMPLAINTS/PURPOSE OF CONSULTATION:  Evaluation of iron deficiency anemia  HISTORY OF PRESENTING ILLNESS:  Daniel [name] 70 y.o. male is here because of evaluation of iron deficiency anemia, at the request of Dr. [name]. He has a history of severe esophagitis and gastric issues and was hospitalized in [facility] in April after presenting with coffee-ground emesis and suspected ileus on 04/09. He is scheduled to have a colonoscopy on 06/09.  The patient is not able to provide his own history and is accompanied by his caretakers and legal guardian from [facility] in [place]; he has been living in group home for the past 15 years. He was there since 06/26/2020 since getting COVID. He also had a CVA in 12/2019. He continues vomiting with coffee-ground emesis after finishing eating since coming out of the hospital in April. He has lost weight since last year. There has not been any hematochezia, hematuria or hematemesis. He has alternating weeks where he is more interactive and eats more, while other weeks he stays in bed and eats 30-50% of his food. He was never started on Retacrit injections. He denies having F/C or night sweats.  His niece had breast cancer.   MEDICAL HISTORY:  Past Medical History:  Diagnosis Date  . Acute kidney failure ([place])   . Acute respiratory failure ([place])   . Adenomatous polyp of stomach   . Anemia   . Benign prostate hyperplasia   . Dementia ([place])   . Depression   . Diabetes mellitus without complication ([place])   . DVT (deep venous thrombosis) ([place])   . Gastric ulcer   . GERD (gastroesophageal reflux  disease)   . High cholesterol   . Hypertension   . Hypo-osmolality and hyponatremia   . Insomnia   . Neuromuscular dysfunction of bladder   . Osteoarthritis   . Partial intestinal obstruction ([place])   . Pneumonitis   . Protein-calorie malnutrition, moderate ([place])   . Stroke [facility])     SURGICAL HISTORY: Past Surgical History:  Procedure Laterality Date  . BIOPSY  06/21/2020   Procedure: BIOPSY;  Surgeon: Daneil Dolin, MD;  [facility];  Service: Endoscopy;;  gastric  . BIOPSY  09/18/2020   Procedure: BIOPSY;  Surgeon: Montez Morita, [name], MD;  [facility];  Service: Gastroenterology;;  . ESOPHAGOGASTRODUODENOSCOPY (EGD) WITH PROPOFOL N/A 06/21/2020   Procedure: ESOPHAGOGASTRODUODENOSCOPY (EGD) WITH PROPOFOL;  Surgeon: Daneil Dolin, MD;  [facility];  Service: Endoscopy;  Laterality: N/A;  . ESOPHAGOGASTRODUODENOSCOPY (EGD) WITH PROPOFOL N/A 09/18/2020   Procedure: ESOPHAGOGASTRODUODENOSCOPY (EGD) WITH PROPOFOL;  Surgeon: Harvel Quale, MD;  [facility];  Service: Gastroenterology;  Laterality: N/A;  . HIP SURGERY Left   . KIDNEY SURGERY     kidney removed    SOCIAL HISTORY: Social History   Socioeconomic History  . Marital status: Single    Spouse name: Not on file  . Number of children: Not on file  . Years of education: Not on file  . Highest education level: Not on file  Occupational History  . Not on file  Tobacco Use  . Smoking status: Never Smoker  . Smokeless  tobacco: Never Used  Vaping Use  . Vaping Use: Never used  Substance and Sexual Activity  . Alcohol use: No  . Drug use: No  . Sexual activity: Never  Other Topics Concern  . Not on file  Social History Narrative  . Not on file   Social Determinants of Health   Financial Resource Strain: Not on file  Food Insecurity: Not on file  Transportation Needs: Not on file  Physical Activity: Not on file  Stress: Not on file  Social  Connections: Not on file  Intimate Partner Violence: Not At Risk  . Fear of Current or Ex-Partner: No  . Emotionally Abused: No  . Physically Abused: No  . Sexually Abused: No    FAMILY HISTORY: Family History  Problem Relation Age of Onset  . Hypertension Sister   . Hyperlipidemia Sister   . Osteoarthritis Sister   . Rheum arthritis Sister   . Breast cancer Niece   . Colon cancer Neg Hx   . Esophageal cancer Neg Hx   . Inflammatory bowel disease Neg Hx   . Liver disease Neg Hx   . Pancreatic cancer Neg Hx   . Stomach cancer Neg Hx   . Rectal cancer Neg Hx     ALLERGIES:  has No Known Allergies.  MEDICATIONS:  Current Outpatient Medications  Medication Sig Dispense Refill  . acetaminophen (TYLENOL) 325 MG tablet Take 2 tablets (650 mg total) by mouth every 6 (six) hours as needed for mild pain, fever or headache (or Fever >/= 101). 12 tablet 0  . aMILoride (MIDAMOR) 5 MG tablet Take 1 tablet (5 mg total) by mouth daily. 30 tablet 5  . amitriptyline (ELAVIL) 10 MG tablet Take 10 mg by mouth at bedtime.    Derrill Memo ON 10/18/2020] apixaban (ELIQUIS) 5 MG TABS tablet Take 1 tablet (5 mg total) by mouth 2 (two) times daily. Start around May 12th 2022  after you finish the initial Eliquis starter pack 60 tablet 2  . ASPIRIN LOW DOSE 81 MG EC tablet Take 81 mg by mouth daily.    . Cholecalciferol (VITAMIN D3) 2000 units capsule TAKE 1 CAPSULE BY MOUTH ONCE DAILY. **DO NOT CRUSH** (Patient taking differently: Take 2,000 Units by mouth daily.) 30 capsule 5  . clopidogrel (PLAVIX) 75 MG tablet Take 1 tablet by mouth daily.    Marland Kitchen donepezil (ARICEPT) 10 MG tablet TAKE (1) TABLET BY MOUTH ONCE DAILY. (Patient taking differently: Take 10 mg by mouth daily.) 30 tablet PRN  . dronabinol (MARINOL) 2.5 MG capsule Take 2.5 mg by mouth 2 (two) times daily before lunch and supper.    . feeding supplement (ENSURE ENLIVE / ENSURE PLUS) LIQD Take 237 mLs by mouth 2 (two) times daily between meals. 237  mL 12  . FEROSUL 325 (65 Fe) MG tablet TAKE 1 TABLET BY MOUTH TWICE DAILY. (Patient taking differently: Take 325 mg by mouth in the morning and at bedtime.) [id number] tablet 3  . folic acid (FOLVITE) [id number] MCG tablet Take 800 mcg by mouth daily.    Marland Kitchen GOODSENSE PAIN RELIEF 650 MG CR tablet Take by mouth.    . insulin aspart (NOVOLOG) 100 UNIT/ML FlexPen Inject 0-10 Units into the skin 3 (three) times daily with meals. insulin aspart (novoLOG) injection 0-10 Units 0-10 Units Subcutaneous, 3 times daily with meals CBG < 70: Implement Hypoglycemia Standing Orders and refer to Hypoglycemia Standing Orders sidebar report  CBG 70 - 120: 0 unit CBG 121 - 150:  0 unit  CBG 151 - 200: 1 unit CBG 201 - 250: 2 units CBG 251 - 300: 4 units CBG 301 - 350: 6 units  CBG 351 - 400: 8 units  CBG > 400: 10 units 15 mL 11  . Lactulose 20 GM/30ML SOLN Take 30 mLs (20 g total) by mouth See admin instructions. --Please give lactulose 20 g p.o. x1 if no bowel movement in 48 hours 236 mL 0  . magnesium oxide (MAG-OX) 400 MG tablet Take 1 tablet (400 mg total) by mouth 2 (two) times daily. 180 tablet 5  . MAGNESIUM-OXIDE 400 (240 Mg) MG tablet Take by mouth 2 (two) times daily.    . memantine (NAMENDA) 10 MG tablet Take 10 mg by mouth 2 (two) times daily.    . metFORMIN (GLUCOPHAGE) 1000 MG tablet Take 1 tablet (1,000 mg total) by mouth 2 (two) times daily with a meal. 60 tablet 3  . metoCLOPramide (REGLAN) 5 MG tablet Take 1 tablet (5 mg total) by mouth 3 (three) times daily. For stomach 90 tablet 1  . NOVOLIN 70/30 FLEXPEN (70-30) 100 UNIT/ML KwikPen Inject into the skin.    Marland Kitchen ondansetron (ZOFRAN) 4 MG tablet Take 1 tablet (4 mg total) by mouth every 6 (six) hours as needed for nausea. 20 tablet 0  . OVER THE COUNTER MEDICATION Vit D 3 one tablet po daily.    Marland Kitchen oxybutynin (DITROPAN) 5 MG tablet Take 5 mg by mouth daily.    Marland Kitchen oxybutynin (DITROPAN-XL) 5 MG 24 hr tablet Take 5 mg by mouth daily.    . OXYGEN Inhale 2 L into the lungs  daily.    . pantoprazole (PROTONIX) 40 MG tablet Take 1 tablet (40 mg total) by mouth daily. 90 tablet 3  . polyethylene glycol (MIRALAX) 17 g packet Take 17 g by mouth 2 (two) times daily. For Constipation 60 each 2  . pyridostigmine (MESTINON) 60 MG tablet Take 1 tablet (60 mg total) by mouth 3 (three) times daily. 270 tablet 3  . senna-docusate (SENOKOT-S) 8.6-50 MG tablet Take 2 tablets by mouth at bedtime. 60 tablet 3  . sucralfate (CARAFATE) 1 g tablet Take 1 tablet (1 g total) by mouth 4 (four) times daily. 120 tablet 1  . tamsulosin (FLOMAX) 0.4 MG CAPS capsule TAKE 1 CAPSULE BY MOUTH AT BEDTIME. (Patient taking differently: Take 0.4 mg by mouth at bedtime.) 90 capsule 1  . traZODone (DESYREL) 100 MG tablet Take 100 mg by mouth at bedtime.    . vitamin B-12 (CYANOCOBALAMIN) 1000 MCG tablet Take 1,000 mcg by mouth daily.    . vitamin B-12 (CYANOCOBALAMIN) 500 MCG tablet Take 1,000 mcg by mouth daily.    Marland Kitchen atorvastatin (LIPITOR) 40 MG tablet Take 1 tablet (40 mg total) by mouth daily. 30 tablet 4  . metoprolol tartrate (LOPRESSOR) 25 MG tablet Take 1 tablet (25 mg total) by mouth 2 (two) times daily. 60 tablet 3   No current facility-administered medications for this visit.    REVIEW OF SYSTEMS:   Review of Systems  Constitutional: Positive for appetite change (50%) and fatigue (depleted). Negative for chills, diaphoresis and fever.  Gastrointestinal: Positive for vomiting (coffee ground emesis). Negative for blood in stool.  Genitourinary: Negative for hematuria.   All other systems reviewed and are negative.    PHYSICAL EXAMINATION: ECOG PERFORMANCE STATUS: 3 - Symptomatic, >50% confined to bed  Vitals:   10/11/20 1325  BP: 110/63  Pulse: 75  Resp: 20  Temp: Marland Kitchen)  97.2 F (36.2 C)  SpO2: 100%   Filed Weights   Physical Exam Vitals reviewed.  Constitutional:      Appearance: Normal appearance.     Interventions: Nasal cannula in place.     Comments: In wheelchair   Cardiovascular:     Rate and Rhythm: Normal rate and regular rhythm.     Pulses: Normal pulses.     Heart sounds: Normal heart sounds.  Pulmonary:     Effort: Pulmonary effort is normal.     Breath sounds: Normal breath sounds.  Neurological:     General: No focal deficit present.     Mental Status: He is alert and oriented to person, place, and time.  Psychiatric:        Mood and Affect: Mood normal.        Speech: He is noncommunicative.        Behavior: Behavior normal.        Cognition and Memory: Cognition is impaired.      LABORATORY DATA:  I have reviewed the data as listed Recent Results (from the past 2160 hour(s))  Surgical pathology     Status: None   Collection Time: 09/18/20  3:00 PM  Result Value Ref Range   SURGICAL PATHOLOGY      SURGICAL PATHOLOGY CASE: [id number] PATIENT: Sandi Mariscal Surgical Pathology Report   Clinical History: hematemesis, history of PUD   FINAL MICROSCOPIC DIAGNOSIS:  A. DUODENAL MASS, BIOPSY: -  Duodenal adenoma -  No high-grade dysplasia or malignancy identified   GROSS DESCRIPTION:  Received in formalin are tan, soft tissue fragments that are submitted in toto. Number: Multiple size: Range from 0.1 to 0.3 cm blocks: 1 Craig Staggers 09/19/2020)   Final Diagnosis performed by Thressa Sheller, MD.   Electronically signed 09/20/2020 Technical component performed at [facility], [place] [address] [place], [place] [address].  Professional component performed at [employer]. [facility], [place] [address] [place], [place] [address].  Immunohistochemistry Technical component (if applicable) was performed at [facility]. [address], [place], [place], [place] [address].   IMMUNOHISTOCHEMISTRY DISCLAIMER (if applicable): Some of  these immunohistochemical stains may have been developed and the performance characteristics determine by [facility]. Some may not have been cleared or  approved by the U.S. Food and Drug Administration. The FDA has determined that such clearance or approval is not necessary. This test is used for clinical purposes. It should not be regarded as investigational or for research. This laboratory is certified under the [place] (CLIA-88) as qualified to perform high complexity clinical laboratory testing.  The controls stained appropriately.   Renal function panel     Status: Abnormal   Collection Time: 09/19/20  6:35 AM  Result Value Ref Range   Sodium 134 (L) 135 - 145 mmol/L   Potassium 4.1 3.5 - 5.1 mmol/L   Chloride 101 98 - 111 mmol/L   CO2 22 22 - 32 mmol/L   Glucose, Bld 124 (H) 70 - 99 mg/dL    Comment: Glucose reference range applies only to samples taken after fasting for at least 8 hours.   BUN 15 8 - 23 mg/dL   Creatinine, Ser 1.03 0.61 - 1.24 mg/dL   Calcium 9.0 8.9 - 10.3 mg/dL   Phosphorus 3.1 2.5 - 4.6 mg/dL   Albumin 2.8 (L) 3.5 - 5.0 g/dL   GFR, Estimated >60 >60 mL/min    Comment: (NOTE) Calculated using the  CKD-EPI Creatinine Equation (2021)    Anion gap 11 5 - 15    Comment: Performed at [facility], [address] [place], [place] [address]  CBC     Status: Abnormal   Collection Time: 09/19/20  6:35 AM  Result Value Ref Range   WBC 7.3 4.0 - 10.5 K/uL   RBC 3.37 (L) 4.22 - 5.81 MIL/uL   Hemoglobin 9.1 (L) 13.0 - 17.0 g/dL   HCT 28.4 (L) 39.0 - 52.0 %   MCV 84.3 80.0 - 100.0 fL   MCH 27.0 26.0 - 34.0 pg   MCHC 32.0 30.0 - 36.0 g/dL   RDW 17.1 (H) 11.5 - 15.5 %   Platelets 323 150 - 400 K/uL   nRBC 0.0 0.0 - 0.2 %    Comment: Performed at [facility], [address] [place], [place] [address]    RADIOGRAPHIC STUDIES: I have personally reviewed the radiological images as listed and agreed with the findings in the report. CT CHEST W CONTRAST  Result Date: 09/18/2020 CLINICAL DATA:  Abnormal gastric shape.  Possible hernia. Vomiting. EXAM: CT CHEST WITH CONTRAST  TECHNIQUE: Multidetector CT imaging of the chest was performed during intravenous contrast administration. CONTRAST:  40mL OMNIPAQUE IOHEXOL 300 MG/ML  SOLN COMPARISON:  Abdominopelvic CT 3 days ago FINDINGS: Cardiovascular: Tortuous thoracic aorta. No aortic aneurysm. Mild dilatation of the main pulmonary artery at 3.3 cm. Borderline cardiomegaly. Elongated fluid density structure adjacent to the posterolateral left heart border spans approximately 3.2 cm, better defined on prior abdominal CT, currently obscured by patient motion. This is grossly stable. No significant pericardial effusion Mediastinum/Nodes: No enlarged mediastinal or hilar lymph nodes. Scattered prominent bilateral axillary nodes measuring up to 9 mm on the right and 8 mm on the left. The esophagus is patulous with areas of wall thickening. No definite hiatal hernia. No visualized thyroid nodule. Lungs/Pleura: Significant breathing motion artifact limits assessment. Ground-glass and consolidative airspace disease in the right lower lobe is similar to prior abdominal CT. Few patchy ground-glass opacities in the superior segment of the right lower lobe and adjacent right upper lobe with mild improvement. There is mild improvement in the ground-glass opacity in the left lower lobe. No new or progressive consolidation. No significant pleural effusion. Limited assessment for nodule given motion. Upper Abdomen: Included stomach contains small amount of fluid/ingested material. Gastric anatomy is normal were visualized. No evidence of gastric hernia. Motion artifact through the upper abdomen. Musculoskeletal: Thoracic spondylosis with multilevel endplate spurring. There are no acute or suspicious osseous abnormalities. IMPRESSION: 1. Patulous esophagus with areas of wall thickening, lower esophagus appears similar to prior abdominal CT. No evidence of gastric or hiatal hernia. The stomach in the included upper abdomen contains a small amount of fluid and  is unremarkable. 2. Multifocal airspace opacities, greatest in the right lower lobe. There is overall mild improvement from prior exam. Breathing motion artifact limits detailed assessment. 3. Elongated fluid density structure adjacent to the posterolateral left heart border, better defined on prior abdominal CT, currently obscured by patient motion. This is grossly stable, favor pericardial cyst. 4. Mild dilatation of the main pulmonary artery suggesting pulmonary arterial hypertension. 5. Coronary artery calcifications. Aortic Atherosclerosis (ICD10-I70.0). Electronically Signed   By: Keith Rake M.D.   On: 09/18/2020 18:52   CT ABDOMEN PELVIS W CONTRAST  Result Date: 09/15/2020 CLINICAL DATA:  Gastrointestinal bleeding. Projectile vomiting with coffee-ground brown emesis. EXAM: CT ABDOMEN AND PELVIS WITH CONTRAST TECHNIQUE: Multidetector CT imaging of the abdomen and  pelvis was performed using the standard protocol following bolus administration of intravenous contrast. CONTRAST:  142mL OMNIPAQUE 300 COMPARISON:  09/07/2020 FINDINGS: Lower chest: Stable 5.6 x 2.4 cm oval area of fluid density adjacent to the posterolateral aspect of the heart on the left on image number 14/6. Diffuse low density distal esophageal wall thickening with improvement. Normal sized heart. Atheromatous coronary artery calcifications. Patchy densities in the right lower lobe with some consolidation with mild improvement. Patchy densities in the posterior right upper lobe with mild improvement. Small amount of interval dependent patchy density in the left lower lobe. No pleural fluid. Hepatobiliary: No focal liver abnormality is seen. No gallstones, gallbladder wall thickening, or biliary dilatation. Pancreas: Stable normal appearing pancreatic head and proximal neck. 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 Spleen: Normal in size without focal abnormality. Adrenals/Urinary Tract: Normal appearing adrenal  glands. Stable right renal cysts and small parenchymal calcification. Stable possible small, severely atrophied left kidney. Unremarkable urinary bladder and right ureter. Stomach/Bowel: Stable dilated stomach and colon. Normal caliber small bowel. No evidence of appendicitis. Vascular/Lymphatic: No significant vascular findings are present. No enlarged abdominal or pelvic lymph nodes. Reproductive: Prostate is unremarkable. Other: Small umbilical hernia containing fat. Musculoskeletal: Stable left hip prosthesis. Lumbar and lower thoracic spine degenerative changes. Stable bilateral L5 pars defects with grade 2 anterolisthesis and vertebral body fusion at the L5-S1 level. IMPRESSION: 1. No acute abnormality. 2. Improving distal esophagitis. 3. Stable dilated stomach and colon, compatible with ileus. No findings to indicate interval gastric outlet obstruction. 4. Mildly improved bilateral lung opacities, compatible with improving pneumonia. 5. Stable 5.6 x 2.4 cm oval area of fluid density adjacent to the posterolateral aspect of the heart on the left, compatible with a pericardial cyst. 6. 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 7. Stable possible small, severely atrophied left kidney. Electronically Signed   By: Claudie Revering M.D.   On: 09/15/2020 21:58   US Venous Img Lower Bilateral (DVT)  Result Date: 09/17/2020 CLINICAL DATA:  History of right lower extremity DVT EXAM: BILATERAL LOWER EXTREMITY VENOUS DOPPLER ULTRASOUND TECHNIQUE: Gray-scale sonography with graded compression, as well as color Doppler and duplex ultrasound were performed to evaluate the lower extremity deep venous systems from the level of the common femoral vein and including the common femoral, femoral, profunda femoral, popliteal and calf veins including the posterior tibial, peroneal and gastrocnemius veins when visible. The superficial great saphenous vein was also  interrogated. Spectral Doppler was utilized to evaluate flow at rest and with distal augmentation maneuvers in the common femoral, femoral and popliteal veins. COMPARISON:  None. FINDINGS: RIGHT LOWER EXTREMITY Common Femoral Vein: Incompletely compressible. Heterogeneous echogenic material present within the lumen consistent with nonocclusive DVT. Partial flow visualized on color Doppler imaging. Saphenofemoral Junction: Nonocclusive thrombus extends into the saphenofemoral junction. Profunda Femoral Vein: No evidence of thrombus. Normal compressibility and flow on color Doppler imaging. Femoral Vein: Nonocclusive thrombus extends into the femoral vein throughout the thigh. Popliteal Vein: Thrombus extends into the popliteal vein where it is occlusive. Calf Veins: Occlusive thrombus extends into the peroneal and posterior tibial veins. Superficial Great Saphenous Vein: No evidence of thrombus. Normal compressibility. Venous Reflux:  None. Other Findings:  None. LEFT LOWER EXTREMITY Common Femoral Vein: No evidence of thrombus. Normal compressibility, respiratory phasicity and response to augmentation. Saphenofemoral Junction: No evidence of thrombus. Normal compressibility and flow on color Doppler imaging. Profunda Femoral Vein: No evidence of thrombus.  Normal compressibility and flow on color Doppler imaging. Femoral Vein: No evidence of thrombus. Normal compressibility, respiratory phasicity and response to augmentation. Popliteal Vein: No evidence of thrombus. Normal compressibility, respiratory phasicity and response to augmentation. Calf Veins: No evidence of thrombus. Normal compressibility and flow on color Doppler imaging. Superficial Great Saphenous Vein: No evidence of thrombus. Normal compressibility. Venous Reflux:  None. Other Findings:  None. IMPRESSION: 1. Occlusive thrombus present within the right calf veins and right popliteal vein. Nonocclusive thrombus extends throughout the femoral vein in the  thigh and into the common femoral vein. Given reported history of prior right lower extremity DVT, findings are favored to represent subacute versus acute on chronic DVT. Recommend correlation with prior outside imaging if available. 2. No evidence of DVT in the left lower extremity. Electronically Signed   By: Jacqulynn Cadet M.D.   On: 09/17/2020 12:33   DG Chest Port 1 View  Result Date: 09/15/2020 CLINICAL DATA:  Coffee-ground emesis EXAM: PORTABLE CHEST 1 VIEW COMPARISON:  07/27/2020 FINDINGS: The heart size and mediastinal contours are within normal limits. Both lungs are clear. Unchanged elevation of the bilateral hemidiaphragms with associated scarring or atelectasis. The visualized skeletal structures are unremarkable. IMPRESSION: No acute abnormality of the lungs in AP portable projection. Electronically Signed   By: Eddie Candle M.D.   On: 09/15/2020 19:14    ASSESSMENT:  1.  Normocytic anemia: - Patient seen at the request of Dr. Jenetta Downer for further management of normocytic anemia. - He had history of GI bleed secondary to severe esophagitis, gastric ulcers. 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 - He is scheduled for colonoscopy in June of this year. - Much of the history provided by patient's legal guardian [name]. - She reports that he had lost 50 pounds since August 2021 and gained some weight back. - CBC on 10/09/2020 done at [facility] in [place] showed hemoglobin 8.5 with MCV 83.7.  Hemoglobin was 7.7 on 09/28/2020. -He is currently taking iron tablet twice daily, W29 and folic acid 562 mcg each.  2.  Social/family history: - He is now a resident at [facility] in [place].  He is seen with his legal guardian, [name]. - No family history  of sickle cell or thalassemia.  Niece has breast cancer.    PLAN:  1.  Normocytic anemia: - Most likely combination anemia from blood loss and iron deficiency. - We will repeat his CBC, check for nutritional deficiencies including ferritin, iron panel, Z30, folic acid, methylmalonic acid and copper levels. - We will also check for SPEP and free light chains.  We will check stool for occult blood x3. - If his ferritin is severely low, will consider parenteral iron therapy as he is already on iron tablet twice daily. - RTC 2 weeks for follow-up.    All questions were answered. The patient knows to call the clinic with any problems, questions or concerns.   Derek Jack, MD 10/11/20 2:57 PM  [place] [phone]   I, Milinda Antis, am acting as a scribe for Dr. Sanda Linger.  I, Derek Jack MD, have reviewed the above documentation for accuracy and completeness, and I agree with the above.

## 2020-10-11 NOTE — Patient Instructions (Signed)
[place] at [facility] Discharge Instructions  You were seen today by Dr. Delton Coombes. He went over your recent results. You had labs drawn today for further analysis to determine the cause of your anemia. Dr. Delton Coombes will see you back in 2 weeks for follow up.   Thank you for choosing [place] at [facility] to provide your oncology and hematology care.  To afford each patient quality time with our provider, please arrive at least 15 minutes before your scheduled appointment time.   If you have a lab appointment with the [facility] [facility] please come in thru the Main Entrance and check in at the main information desk  You need to re-schedule your appointment should you arrive 10 or more minutes late.  We strive to give you quality time with our providers, and arriving late affects you and other patients whose appointments are after yours.  Also, if you no show three or more times for appointments you may be dismissed from the clinic at the providers discretion.     Again, thank you for choosing [facility].  Our hope is that these requests will decrease the amount of time that you wait before being seen by our physicians.       _____________________________________________________________  Should you have questions after your visit to [facility], please contact our office at [phone] between the hours of 8:00 a.m. and 4:30 p.m.  Voicemails left after 4:00 p.m. will not be returned until the following business day.  For prescription refill requests, have your pharmacy contact our office and allow 72 hours.    Cancer Center Support Programs:   > Cancer Support Group  2nd Tuesday of the month 1pm-2pm, Journey Room

## 2020-10-12 LAB — KAPPA/LAMBDA LIGHT CHAINS
Kappa free light chain: 62.3 mg/L — ABNORMAL HIGH (ref 3.3–19.4)
Kappa, lambda light chain ratio: 1.86 — ABNORMAL HIGH (ref 0.26–1.65)
Lambda free light chains: 33.5 mg/L — ABNORMAL HIGH (ref 5.7–26.3)

## 2020-10-12 LAB — ERYTHROPOIETIN: Erythropoietin: 20.1 m[IU]/mL — ABNORMAL HIGH (ref 2.6–18.5)

## 2020-10-14 LAB — COPPER, SERUM: Copper: 79 ug/dL (ref 69–132)

## 2020-10-15 LAB — PROTEIN ELECTROPHORESIS, SERUM
A/G Ratio: 1 (ref 0.7–1.7)
Albumin ELP: 3.2 g/dL (ref 2.9–4.4)
Alpha-1-Globulin: 0.2 g/dL (ref 0.0–0.4)
Alpha-2-Globulin: 0.8 g/dL (ref 0.4–1.0)
Beta Globulin: 1 g/dL (ref 0.7–1.3)
Gamma Globulin: 1.1 g/dL (ref 0.4–1.8)
Globulin, Total: 3.1 g/dL (ref 2.2–3.9)
Total Protein ELP: 6.3 g/dL (ref 6.0–8.5)

## 2020-10-15 LAB — METHYLMALONIC ACID, SERUM: Methylmalonic Acid, Quantitative: 215 nmol/L (ref 0–378)

## 2020-10-17 ENCOUNTER — Ambulatory Visit: Payer: Medicare Other | Admitting: Gastroenterology

## 2020-10-26 ENCOUNTER — Other Ambulatory Visit: Payer: Self-pay

## 2020-10-26 ENCOUNTER — Inpatient Hospital Stay (HOSPITAL_BASED_OUTPATIENT_CLINIC_OR_DEPARTMENT_OTHER): Payer: Medicare Other | Admitting: Hematology

## 2020-10-26 ENCOUNTER — Encounter (HOSPITAL_COMMUNITY): Payer: Self-pay | Admitting: Hematology

## 2020-10-26 DIAGNOSIS — D649 Anemia, unspecified: Secondary | ICD-10-CM | POA: Diagnosis not present

## 2020-10-26 DIAGNOSIS — D500 Iron deficiency anemia secondary to blood loss (chronic): Secondary | ICD-10-CM | POA: Diagnosis not present

## 2020-10-26 DIAGNOSIS — E440 Moderate protein-calorie malnutrition: Secondary | ICD-10-CM

## 2020-10-26 NOTE — Progress Notes (Signed)
[place], [place] [address]   CLINIC:  Medical Oncology/Hematology  PCP:  Leonie Douglas, MD [address] [place] [address] [phone]   REASON FOR VISIT:  Follow-up for normocytic anemia.  PRIOR THERAPY: Iron tablet twice daily.  CURRENT THERAPY: Feraheme x2.  INTERVAL HISTORY:  Daniel [name] 70 y.o. male seen for follow-up of normocytic anemia.  Appetite is 75%.  Energy levels are 50%.  Denies any bleeding per rectum or melena.  His nurse reports that vomiting is better and he is also eating better.    REVIEW OF SYSTEMS:  Review of Systems  All other systems reviewed and are negative.    PAST MEDICAL/SURGICAL HISTORY:  Past Medical History:  Diagnosis Date  . Acute kidney failure ([place])   . Acute respiratory failure ([place])   . Adenomatous polyp of stomach   . Anemia   . Benign prostate hyperplasia   . Dementia ([place])   . Depression   . Diabetes mellitus without complication ([place])   . DVT (deep venous thrombosis) ([place])   . Gastric ulcer   . GERD (gastroesophageal reflux disease)   . High cholesterol   . Hypertension   . Hypo-osmolality and hyponatremia   . Insomnia   . Neuromuscular dysfunction of bladder   . Osteoarthritis   . Partial intestinal obstruction ([facility])   . Pneumonitis   . Protein-calorie malnutrition, moderate ([place])   . Stroke [facility])    Past Surgical History:  Procedure Laterality Date  . BIOPSY  06/21/2020   Procedure: BIOPSY;  Surgeon: Daneil Dolin, MD;  Location: [facility];  Service: Endoscopy;;  gastric  . BIOPSY  09/18/2020   Procedure: BIOPSY;  Surgeon: Montez Morita, [name], MD;  Location[facility];  Service: Gastroenterology;;  . ESOPHAGOGASTRODUODENOSCOPY (EGD) WITH PROPOFOL N/A 06/21/2020   Procedure: ESOPHAGOGASTRODUODENOSCOPY (EGD) WITH PROPOFOL;  Surgeon: Daneil Dolin, MD;  [facility];  Service: Endoscopy;  Laterality: N/A;  . ESOPHAGOGASTRODUODENOSCOPY  (EGD) WITH PROPOFOL N/A 09/18/2020   Procedure: ESOPHAGOGASTRODUODENOSCOPY (EGD) WITH PROPOFOL;  Surgeon: Harvel Quale, MD;  Location[facility];  Service: Gastroenterology;  Laterality: N/A;  . HIP SURGERY Left   . KIDNEY SURGERY     kidney removed     SOCIAL HISTORY:  Social History   Socioeconomic History  . Marital status: Single    Spouse name: Not on file  . Number of children: Not on file  . Years of education: Not on file  . Highest education level: Not on file  Occupational History  . Not on file  Tobacco Use  . Smoking status: Never Smoker  . Smokeless tobacco: Never Used  Vaping Use  . Vaping Use: Never used  Substance and Sexual Activity  . Alcohol use: No  . Drug use: No  . Sexual activity: Never  Other Topics Concern  . Not on file  Social History Narrative  . Not on file   Social Determinants of Health   Financial Resource Strain: Not on file  Food Insecurity: Not on file  Transportation Needs: Not on file  Physical Activity: Not on file  Stress: Not on file  Social Connections: Not on file  Intimate Partner Violence: Not At Risk  . Fear of Current or Ex-Partner: No  . Emotionally Abused: No  . Physically Abused: No  . Sexually Abused: No    FAMILY HISTORY:  Family History  Problem Relation Age of Onset  .  Hypertension Sister   . Hyperlipidemia Sister   . Osteoarthritis Sister   . Rheum arthritis Sister   . Breast cancer Niece   . Colon cancer Neg Hx   . Esophageal cancer Neg Hx   . Inflammatory bowel disease Neg Hx   . Liver disease Neg Hx   . Pancreatic cancer Neg Hx   . Stomach cancer Neg Hx   . Rectal cancer Neg Hx     CURRENT MEDICATIONS:  Outpatient Encounter Medications as of 10/26/2020  Medication Sig Note  . acetaminophen (TYLENOL) 325 MG tablet Take 2 tablets (650 mg total) by mouth every 6 (six) hours as needed for mild pain, fever or headache (or Fever >/= 101).   Marland Kitchen aMILoride (MIDAMOR) 5 MG tablet Take 1  tablet (5 mg total) by mouth daily.   Marland Kitchen amitriptyline (ELAVIL) 10 MG tablet Take 10 mg by mouth at bedtime.   Marland Kitchen apixaban (ELIQUIS) 5 MG TABS tablet Take 1 tablet (5 mg total) by mouth 2 (two) times daily. Start around May 12th 2022  after you finish the initial Eliquis starter pack   . ASPIRIN LOW DOSE 81 MG EC tablet Take 81 mg by mouth daily.   . clopidogrel (PLAVIX) 75 MG tablet Take 1 tablet by mouth daily.   Marland Kitchen donepezil (ARICEPT) 10 MG tablet TAKE (1) TABLET BY MOUTH ONCE DAILY. (Patient taking differently: Take 10 mg by mouth daily.)   . dronabinol (MARINOL) 2.5 MG capsule Take 2.5 mg by mouth 2 (two) times daily before lunch and supper.   . feeding supplement (ENSURE ENLIVE / ENSURE PLUS) LIQD Take 237 mLs by mouth 2 (two) times daily between meals.   . FEROSUL 325 (65 Fe) MG tablet TAKE 1 TABLET BY MOUTH TWICE DAILY. (Patient taking differently: Take 325 mg by mouth in the morning and at bedtime.)   . folic acid (FOLVITE) 161 MCG tablet Take 800 mcg by mouth daily.   Marland Kitchen GOODSENSE PAIN RELIEF 650 MG CR tablet Take by mouth.   . insulin aspart (NOVOLOG) 100 UNIT/ML FlexPen Inject 0-10 Units into the skin 3 (three) times daily with meals. insulin aspart (novoLOG) injection 0-10 Units 0-10 Units Subcutaneous, 3 times daily with meals CBG < 70: Implement Hypoglycemia Standing Orders and refer to Hypoglycemia Standing Orders sidebar report  CBG 70 - 120: 0 unit CBG 121 - 150: 0 unit  CBG 151 - 200: 1 unit CBG 201 - 250: 2 units CBG 251 - 300: 4 units CBG 301 - 350: 6 units  CBG 351 - 400: 8 units  CBG > 400: 10 units   . Lactulose 20 GM/30ML SOLN Take 30 mLs (20 g total) by mouth See admin instructions. --Please give lactulose 20 g p.o. x1 if no bowel movement in 48 hours   . magnesium oxide (MAG-OX) 400 MG tablet Take 1 tablet (400 mg total) by mouth 2 (two) times daily.   Marland Kitchen MAGNESIUM-OXIDE 400 (240 Mg) MG tablet Take by mouth 2 (two) times daily.   . memantine (NAMENDA) 10 MG tablet Take 10 mg by  mouth 2 (two) times daily.   . metFORMIN (GLUCOPHAGE) 1000 MG tablet Take 1 tablet (1,000 mg total) by mouth 2 (two) times daily with a meal.   . metoCLOPramide (REGLAN) 5 MG tablet Take 1 tablet (5 mg total) by mouth 3 (three) times daily. For stomach   . NOVOLIN 70/30 FLEXPEN (70-30) 100 UNIT/ML KwikPen Inject into the skin.   Marland Kitchen ondansetron (ZOFRAN) 4 MG tablet  Take 1 tablet (4 mg total) by mouth every 6 (six) hours as needed for nausea.   Marland Kitchen OVER THE COUNTER MEDICATION Vit D 3 one tablet po daily.   Marland Kitchen oxybutynin (DITROPAN) 5 MG tablet Take 5 mg by mouth daily.   Marland Kitchen oxybutynin (DITROPAN-XL) 5 MG 24 hr tablet Take 5 mg by mouth daily.   . OXYGEN Inhale 2 L into the lungs daily.   . pantoprazole (PROTONIX) 40 MG tablet Take 1 tablet (40 mg total) by mouth daily.   . polyethylene glycol (MIRALAX) 17 g packet Take 17 g by mouth 2 (two) times daily. For Constipation   . pyridostigmine (MESTINON) 60 MG tablet Take 1 tablet (60 mg total) by mouth 3 (three) times daily.   Marland Kitchen senna-docusate (SENOKOT-S) 8.6-50 MG tablet Take 2 tablets by mouth at bedtime.   . sucralfate (CARAFATE) 1 g tablet Take 1 tablet (1 g total) by mouth 4 (four) times daily.   . tamsulosin (FLOMAX) 0.4 MG CAPS capsule TAKE 1 CAPSULE BY MOUTH AT BEDTIME. (Patient taking differently: Take 0.4 mg by mouth at bedtime.)   . traZODone (DESYREL) 100 MG tablet Take 100 mg by mouth at bedtime.   . vitamin B-12 (CYANOCOBALAMIN) 1000 MCG tablet Take 1,000 mcg by mouth daily.   . vitamin B-12 (CYANOCOBALAMIN) 500 MCG tablet Take 1,000 mcg by mouth daily.   . Vitamin D, Cholecalciferol, 25 MCG (1000 UT) TABS Take 1 tablet by mouth.   . [DISCONTINUED] Cholecalciferol (VITAMIN D3) 2000 units capsule TAKE 1 CAPSULE BY MOUTH ONCE DAILY. **DO NOT CRUSH** (Patient taking differently: No sig reported) 07/24/2020: 2 capsules daily.  Marland Kitchen atorvastatin (LIPITOR) 40 MG tablet Take 1 tablet (40 mg total) by mouth daily.   . metoprolol tartrate (LOPRESSOR) 25 MG  tablet Take 1 tablet (25 mg total) by mouth 2 (two) times daily.   . [DISCONTINUED] ramipril (ALTACE) 10 MG capsule Take 2 capsules (20 mg total) by mouth daily.    No facility-administered encounter medications on file as of 10/26/2020.    ALLERGIES:  No Known Allergies   PHYSICAL EXAM:  ECOG Performance status: 2  There were no vitals filed for this visit. There were no vitals filed for this visit. Physical Exam Vitals reviewed.  Constitutional:      Appearance: Normal appearance.  Neurological:     Mental Status: He is alert.  Psychiatric:        Mood and Affect: Mood normal.        Behavior: Behavior normal.    Blood pressure is 108/63.  Pulse rate is 100 and respiratory is 18.  O2 sats 100%.  LABORATORY DATA:  I have reviewed the labs as listed.  CBC    Component Value Date/Time   WBC 4.1 10/11/2020 1508   RBC 3.86 (L) 10/11/2020 1508   RBC 3.80 (L) 10/11/2020 1508   HGB 10.4 (L) 10/11/2020 1508   HCT 34.8 (L) 10/11/2020 1508   PLT 279 10/11/2020 1508   MCV 90.2 10/11/2020 1508   MCH 26.9 10/11/2020 1508   MCHC 29.9 (L) 10/11/2020 1508   RDW 17.2 (H) 10/11/2020 1508   LYMPHSABS 0.9 10/11/2020 1508   MONOABS 0.4 10/11/2020 1508   EOSABS 0.1 10/11/2020 1508   BASOSABS 0.0 10/11/2020 1508   CMP Latest Ref Rng & Units 10/11/2020 09/19/2020 09/17/2020  Glucose 70 - 99 mg/dL 91 124(H) 128(H)  BUN 8 - 23 mg/dL 19 15 21   Creatinine 0.61 - 1.24 mg/dL 0.99 1.03 1.06  Sodium  135 - 145 mmol/L 130(L) 134(L) 132(L)  Potassium 3.5 - 5.1 mmol/L 5.3(H) 4.1 4.4  Chloride 98 - 111 mmol/L 97(L) 101 99  CO2 22 - 32 mmol/L 25 22 25   Calcium 8.9 - 10.3 mg/dL 10.9(H) 9.0 9.7  Total Protein 6.5 - 8.1 g/dL 6.7 - -  Total Bilirubin 0.3 - 1.2 mg/dL 0.4 - -  Alkaline Phos 38 - 126 U/L 70 - -  AST 15 - 41 U/L 12(L) - -  ALT 0 - 44 U/L 14 - -    DIAGNOSTIC IMAGING:  I have independently reviewed the scans and discussed with the patient.  ASSESSMENT 1.  Normocytic anemia: -  Patient seen at the request of Dr. Jenetta Downer for further management of normocytic anemia. - He had history of GI bleed secondary to severe esophagitis, gastric ulcers. 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 - He is scheduled for colonoscopy in June of this year. - Much of the history provided by patient's legal guardian [name]. - She reports that he had lost 50 pounds since August 2021 and gained some weight back. - CBC on 10/09/2020 done at [facility] in [place] showed hemoglobin 8.5 with MCV 83.7.  Hemoglobin was 7.7 on 09/28/2020. -He is currently taking iron tablet twice daily, G53 and folic acid 646 mcg each.  2.  Social/family history: - He is now a resident at [facility] in [place].  He is seen with his legal guardian, [name]. - No family history of sickle cell or thalassemia.  Niece has breast cancer    PLAN:  1.  Normocytic anemia: - Reviewed labs from 10/10/2020 which showed hemoglobin 10.4.  Ferritin is low at 37 and percent saturation 11. - B12, methylmalonic acid, copper and folic acid levels were normal.  SPEP was negative.  Free light chain ratio was mildly elevated at 1.86. - Stool checked at nursing home on 10/15/2020 was negative. - He has been taking iron tablet twice daily for a long time which did not help. - He is having colonoscopy on 11/15/2020 in [place]. - I have recommended parenteral iron therapy with Feraheme x2.  They will have Feraheme infusions at [facility]. - RTC 3 months with repeat labs including ferritin and iron panel.  2.  Mild hypercalcemia: - His calcium level is 10.9 and albumin is 3.3. - Review of his medications revealed that he is taking vitamin D 2000 units daily.  He is not on any calcium  supplements. - Recommend cutting back on vitamin D to 1000 units daily. - We will check calcium and intact PTH levels along with vitamin D levels next visit.  3.  Mild hyperkalemia: - Labs reveal potassium 5.3.  He is not on potassium supplements.  Creatinine is 0.9. - Recommend avoiding fruit juices and potassium rich foods.     Orders placed this encounter:  Orders Placed This Encounter  Procedures  . CBC with Differential/Platelet  . Comprehensive metabolic panel  . Iron and TIBC  . Ferritin  . VITAMIN D 25 Hydroxy (Vit-D Deficiency, Fractures)  . PTH, Intact and Calcium  . Magnesium      Derek Jack, MD [place] [phone]

## 2020-11-12 ENCOUNTER — Other Ambulatory Visit (HOSPITAL_COMMUNITY): Payer: Medicare Other

## 2020-11-13 ENCOUNTER — Encounter (INDEPENDENT_AMBULATORY_CARE_PROVIDER_SITE_OTHER): Payer: Self-pay | Admitting: Gastroenterology

## 2020-11-13 ENCOUNTER — Other Ambulatory Visit: Payer: Self-pay

## 2020-11-13 ENCOUNTER — Ambulatory Visit (INDEPENDENT_AMBULATORY_CARE_PROVIDER_SITE_OTHER): Payer: Medicare Other | Admitting: Gastroenterology

## 2020-11-13 VITALS — BP 110/72 | HR 67 | Temp 97.4°F | Ht 71.0 in

## 2020-11-13 DIAGNOSIS — D509 Iron deficiency anemia, unspecified: Secondary | ICD-10-CM | POA: Diagnosis not present

## 2020-11-13 DIAGNOSIS — K567 Ileus, unspecified: Secondary | ICD-10-CM

## 2020-11-13 NOTE — Progress Notes (Signed)
[name], M.D. Gastroenterology & Hepatology [facility] [address] [place], [place] [address]  Primary Care Physician: Leonie Douglas, MD [address] [place] [id number]  I will communicate my assessment and recommendations to the referring MD via EMR.  Problems: 1. Iron deficiency anemia 2. Duodenal adenoma 3. Gastric ileus  History of Present Illness: Daniel [name] is a 70 y.o. male with past medical history of diabetes, hyperlipidemia, hypertension, history of GI bleed secondary to severe esophagitis, recurrent gastric issues, cognitive impairment and CVA, who presents for follow up of gastric ileus).  The patient was last seen on 10/02/2020. At that time, the patient was started on Mestinon 60 mg 3 times daily for episodes of ileus.  He was referred to hematology for further evaluation of iron deficiency anemia.  He had iron studies ordered at that time but he did not perform on the eighth he saw hematology.  Most recent blood studies from 10/11/2020 showed mildly increased iron of 36 and iron saturation of 11%, with borderline low ferritin levels of 37.  CBC showed a white blood cell count of 4.1, hemoglobin of 10.4 and platelets 279.  Patient was seen by hematology and was prescribed IV iron but has not started it yet.  Patient comes to the room with case legal guardian and one of the CMA's from his nursing facility.  They report he has been eating well and has not been vomiting anymore. Denies any dysphagia or odynophagia. No melena or hematochezia. The patient denies having any nausea, vomiting, fever, chills, hematochezia, melena, hematemesis, abdominal distention, abdominal pain, diarrhea, jaundice, pruritus or weight loss.  Overall he thinks he has been doing much better.  He is scheduled to undergo his EGD and colonoscopy this Thursday with Dr. Rush Landmark.  Last Colonoscopy: Never  Past Medical History: Past  Medical History:  Diagnosis Date  . Acute kidney failure ([place])   . Acute respiratory failure ([place])   . Adenomatous polyp of stomach   . Anemia   . Benign prostate hyperplasia   . Dementia ([place])   . Depression   . Diabetes mellitus without complication ([place])   . DVT (deep venous thrombosis) ([place])   . Gastric ulcer   . GERD (gastroesophageal reflux disease)   . High cholesterol   . Hypertension   . Hypo-osmolality and hyponatremia   . Insomnia   . Neuromuscular dysfunction of bladder   . Osteoarthritis   . Partial intestinal obstruction ([place])   . Pneumonitis   . Protein-calorie malnutrition, moderate ([place])   . Stroke [facility])     Past Surgical History: Past Surgical History:  Procedure Laterality Date  . BIOPSY  06/21/2020   Procedure: BIOPSY;  Surgeon: Daneil Dolin, MD;  [facility];  Service: Endoscopy;;  gastric  . BIOPSY  09/18/2020   Procedure: BIOPSY;  Surgeon: Montez Morita, [name], MD;  [facility];  Service: Gastroenterology;;  . ESOPHAGOGASTRODUODENOSCOPY (EGD) WITH PROPOFOL N/A 06/21/2020   Procedure: ESOPHAGOGASTRODUODENOSCOPY (EGD) WITH PROPOFOL;  Surgeon: Daneil Dolin, MD;  [facility];  Service: Endoscopy;  Laterality: N/A;  . ESOPHAGOGASTRODUODENOSCOPY (EGD) WITH PROPOFOL N/A 09/18/2020   Procedure: ESOPHAGOGASTRODUODENOSCOPY (EGD) WITH PROPOFOL;  Surgeon: Harvel Quale, MD;  [facility];  Service: Gastroenterology;  Laterality: N/A;  . HIP SURGERY Left   . KIDNEY SURGERY     kidney removed    Family History: Family History  Problem Relation Age of  Onset  . Hypertension Sister   . Hyperlipidemia Sister   . Osteoarthritis Sister   . Rheum arthritis Sister   . Breast cancer Niece   . Colon cancer Neg Hx   . Esophageal cancer Neg Hx   . Inflammatory bowel disease Neg Hx   . Liver disease Neg Hx   . Pancreatic cancer Neg Hx   . Stomach cancer Neg Hx   . Rectal cancer Neg Hx     Social  History: Social History   Tobacco Use  Smoking Status Never Smoker  Smokeless Tobacco Never Used   Social History   Substance and Sexual Activity  Alcohol Use No   Social History   Substance and Sexual Activity  Drug Use No    Allergies: No Known Allergies  Medications: Current Outpatient Medications  Medication Sig Dispense Refill  . acetaminophen (TYLENOL) 325 MG tablet Take 2 tablets (650 mg total) by mouth every 6 (six) hours as needed for mild pain, fever or headache (or Fever >/= 101). 12 tablet 0  . aMILoride (MIDAMOR) 5 MG tablet Take 1 tablet (5 mg total) by mouth daily. (Patient taking differently: Take 5 mg by mouth in the morning. (1000)) 30 tablet 5  . amitriptyline (ELAVIL) 10 MG tablet Take 10 mg by mouth at bedtime. (2100)    . apixaban (ELIQUIS) 5 MG TABS tablet Take 1 tablet (5 mg total) by mouth 2 (two) times daily. Start around May 12th 2022  after you finish the initial Eliquis starter pack (Patient taking differently: Take 5 mg by mouth 2 (two) times daily. (1000 & 2100)) 60 tablet 2  . atorvastatin (LIPITOR) 40 MG tablet Take 1 tablet (40 mg total) by mouth daily. (Patient taking differently: Take 40 mg by mouth daily at 6 PM. (1800)) 30 tablet 4  . Cholecalciferol (VITAMIN D3) 50 MCG (2000 UT) TABS Take 2,000 Units by mouth in the morning. (0900)    . donepezil (ARICEPT) 10 MG tablet TAKE (1) TABLET BY MOUTH ONCE DAILY. (Patient taking differently: Take 10 mg by mouth daily. (2100)) 30 tablet PRN  . dronabinol (MARINOL) 2.5 MG capsule Take 2.5 mg by mouth 2 (two) times daily before lunch and supper. (0800 & 1700)    . feeding supplement (ENSURE ENLIVE / ENSURE PLUS) LIQD Take 237 mLs by mouth 2 (two) times daily between meals. 237 mL 12  . FEROSUL 325 (65 Fe) MG tablet TAKE 1 TABLET BY MOUTH TWICE DAILY. (Patient taking differently: Take 325 mg by mouth in the morning and at bedtime. (1000 & 1700)) 696 tablet 3  . folic acid (FOLVITE) 789 MCG tablet Take 800  mcg by mouth daily. (0800)    . insulin aspart (NOVOLOG) 100 UNIT/ML FlexPen Inject 0-10 Units into the skin 3 (three) times daily with meals. insulin aspart (novoLOG) injection 0-10 Units 0-10 Units Subcutaneous, 3 times daily with meals CBG < 70: Implement Hypoglycemia Standing Orders and refer to Hypoglycemia Standing Orders sidebar report  CBG 70 - 120: 0 unit CBG 121 - 150: 0 unit  CBG 151 - 200: 1 unit CBG 201 - 250: 2 units CBG 251 - 300: 4 units CBG 301 - 350: 6 units  CBG 351 - 400: 8 units  CBG > 400: 10 units 15 mL 11  . Lactulose 20 GM/30ML SOLN Take 30 mLs (20 g total) by mouth See admin instructions. --Please give lactulose 20 g p.o. x1 if no bowel movement in 48 hours 236 mL 0  .  magnesium oxide (MAG-OX) 400 MG tablet Take 1 tablet (400 mg total) by mouth 2 (two) times daily. (Patient taking differently: Take 400 mg by mouth in the morning, at noon, and at bedtime. (1000, 1400 & 2000)) 180 tablet 5  . memantine (NAMENDA) 10 MG tablet Take 10 mg by mouth 2 (two) times daily. (1000 & 1700)    . metFORMIN (GLUCOPHAGE) 1000 MG tablet Take 1 tablet (1,000 mg total) by mouth 2 (two) times daily with a meal. (Patient taking differently: Take 1,000 mg by mouth 2 (two) times daily with a meal. (1000 & 1700)) 60 tablet 3  . metoCLOPramide (REGLAN) 5 MG tablet Take 1 tablet (5 mg total) by mouth 3 (three) times daily. For stomach (Patient taking differently: Take 5 mg by mouth 3 (three) times daily. (1000, 1400 & 2000) For stomach) 90 tablet 1  . metoprolol tartrate (LOPRESSOR) 25 MG tablet Take 1 tablet (25 mg total) by mouth 2 (two) times daily. (Patient taking differently: Take 25 mg by mouth 2 (two) times daily. (1000 & 1700)) 60 tablet 3  . ondansetron (ZOFRAN) 4 MG tablet Take 1 tablet (4 mg total) by mouth every 6 (six) hours as needed for nausea. 20 tablet 0  . oxybutynin (DITROPAN-XL) 5 MG 24 hr tablet Take 5 mg by mouth daily. (1000)    . OXYGEN Inhale 2 L into the lungs daily.    .  pantoprazole (PROTONIX) 40 MG tablet Take 1 tablet (40 mg total) by mouth daily. (Patient taking differently: Take 40 mg by mouth daily. (0800)) 90 tablet 3  . polyethylene glycol (MIRALAX) 17 g packet Take 17 g by mouth 2 (two) times daily. For Constipation (Patient taking differently: Take 17 g by mouth 2 (two) times daily. (0800 & 2000) For Constipation) 60 each 2  . pyridostigmine (MESTINON) 60 MG tablet Take 1 tablet (60 mg total) by mouth 3 (three) times daily. (Patient taking differently: Take 60 mg by mouth 3 (three) times daily. (1000, 1400 & 2100)) 270 tablet 3  . senna-docusate (SENOKOT-S) 8.6-50 MG tablet Take 2 tablets by mouth at bedtime. (Patient taking differently: Take 2 tablets by mouth at bedtime. (2000)) 60 tablet 3  . sucralfate (CARAFATE) 1 g tablet Take 1 tablet (1 g total) by mouth 4 (four) times daily. (Patient taking differently: Take 1 g by mouth 4 (four) times daily. (0900, 1300, 1700 & 2100)) 120 tablet 1  . tamsulosin (FLOMAX) 0.4 MG CAPS capsule TAKE 1 CAPSULE BY MOUTH AT BEDTIME. 90 capsule 1  . traZODone (DESYREL) 100 MG tablet Take 100 mg by mouth at bedtime. (2100)    . vitamin B-12 (CYANOCOBALAMIN) 500 MCG tablet Take 1,000 mcg by mouth daily. (0900)     No current facility-administered medications for this visit.    Review of Systems: GENERAL: negative for malaise, night sweats HEENT: No changes in hearing or vision, no nose bleeds or other nasal problems. NECK: Negative for lumps, goiter, pain and significant neck swelling RESPIRATORY: Negative for cough, wheezing CARDIOVASCULAR: Negative for chest pain, leg swelling, palpitations, orthopnea GI: SEE HPI MUSCULOSKELETAL: Negative for joint pain or swelling, back pain, and muscle pain. SKIN: Negative for lesions, rash PSYCH: Negative for sleep disturbance, mood disorder and recent psychosocial stressors. HEMATOLOGY Negative for prolonged bleeding, bruising easily, and swollen nodes. ENDOCRINE: Negative for  cold or heat intolerance, polyuria, polydipsia and goiter. NEURO: negative for tremor, gait imbalance, syncope and seizures. The remainder of the review of systems is noncontributory.   Physical  Exam: BP 110/72 (BP Location: Left Arm, Patient Position: Sitting, Cuff Size: Small)   Pulse 67   Temp (!) 97.4 F (36.3 C) (Oral)   Ht 5\' 11"  (1.803 m)   BMI 20.45 kg/m  GENERAL: The patient is AO x3, in no acute distress. Sitting in wheelchair.  Uses supplemental oxygen. HEENT: Head is normocephalic and atraumatic. EOMI are intact. Mouth is well hydrated and without lesions. NECK: Supple. No masses LUNGS: Clear to auscultation. No presence of rhonchi/wheezing/rales. Adequate chest expansion HEART: RRR, normal s1 and s2. ABDOMEN: Soft, nontender, no guarding, no peritoneal signs, and nondistended. BS +. No masses. EXTREMITIES: Without any cyanosis, clubbing, rash, lesions or edema. NEUROLOGIC: AOx3, no focal motor deficit. SKIN: no jaundice, no rashes  Imaging/Labs: as above  I personally reviewed and interpreted the available labs, imaging and endoscopic files.  Impression and Plan: ODEAN FESTER is a 70 y.o. male with past medical history of diabetes, hyperlipidemia, hypertension, history of GI bleed secondary to severe esophagitis, recurrent gastric issues, cognitive impairment and CVA, who presents for follow up of gastric ileus).  Overall, the patient has presented improvement of his gastric ileus with the use of Mestinon as he has been able to tolerate the medication with adequate intake of food no more episodes of vomiting.  This medication will be continued at the same dose for now.In terms of his iron deficiency anemia, we will proceed with a colonoscopy this week to evaluate other causes of anemia.  Overall his hemoglobin has improved slowly but may even improve more with IV iron infusion.  Given his multiple comorbidities, I consider that more advanced testing likely Endoscopy is not  warranted unless he requires further transfusion of blood.  She will also proceed with the resection of his duodenal mass/adenoma by Dr. Rush Landmark this week.  - Proceed with scheduled EGD and colonoscopy - Continue iron supplementation as recommended by hematology - Continue Mestinon 60 mg three times a day  All questions were answered.      Harvel Quale, MD Gastroenterology and Hepatology [facility]

## 2020-11-13 NOTE — Patient Instructions (Signed)
Proceed with Scheduled EGD and colonoscopy Continue iron supplementation as recommended by hematology Continue Mestinon 60 mg three times a day

## 2020-11-14 ENCOUNTER — Encounter (HOSPITAL_COMMUNITY): Payer: Self-pay | Admitting: Gastroenterology

## 2020-11-14 LAB — ECHOCARDIOGRAM COMPLETE
Area-P 1/2: 2.66 cm2
Height: 68 in
S' Lateral: 2.82 cm
Weight: 2345.6 [oz_av]

## 2020-11-14 NOTE — Progress Notes (Signed)
Unable to reach Assistant DON Artist [employer].  Spoke with  Patient's Nurse Eugenio Hoes, LPN at [phone].  Faxed instructions for DOS to [phone].   PCP - Dr Sandi Raveling Cardiologist - n/a  Chest x-ray - 09/15/20 (1V) EKG - 09/11/20 Stress Test - n/a ECHO - 09/19/20 Cardiac Cath - n/a  Do not take metformin on the morning of surgery.  Blood Thinner Instructions:  Last dose was on 11/13/20.  Anesthesia review: Yes  STOP now taking any Aspirin (unless otherwise instructed by your surgeon), Aleve, Naproxen, Ibuprofen, Motrin, Advil, Goody's, BC's, all herbal medications, fish oil, and all vitamins.   Coronavirus Screening Covid test ordered by MD will be collected on DOS.   [place], [place], states that the patient does not have any SOB, Fever, Cough, Sore throat or runny nose.  No covid symptoms.

## 2020-11-14 NOTE — Progress Notes (Signed)
To:  Eugenio Hoes, LPN ([phone])  Fax: [phone]  RE:  Daniel [name]  Date of Procedure at [facility] 11/15/20    Your procedure is scheduled on Thursday, 11/15/20.  Report to [facility] Main Entrance "A" at 6:30 A.M., then check in with the Admitting office.  Call this number if you have problems the morning of surgery:  [phone]   If you have any questions prior to your surgery date call [phone]: Open Monday-Friday 8am-4pm    Remember:  Follow Dr Donneta Romberg Instructions for Diet and Bowel Prep.  If you have any questions, please call Dr Donneta Romberg office at [phone] for clarification.    Take these medicines the morning of surgery with A SIP OF WATER  Metoprolol Protonix Zofran if needed  Do not take metformin on the morning of surgery.   As of today, STOP taking any Aspirin (unless otherwise instructed by your surgeon) Aleve, Naproxen, Ibuprofen, Motrin, Advil, Goody's, BC's, all herbal medications, fish oil, and all vitamins.          Do not wear jewelry. Do not wear lotions, powders, colognes, or deodorant. Men may shave face and neck. Do not bring valuables to the hospital.             [facility] is not responsible for any belongings or valuables.   Contacts, glasses, dentures or bridgework may not be worn into surgery, please bring cases for these belongings   Patients discharged the day of surgery will not be allowed to drive home, and someone needs to stay with them for 24 hours.   Special instructions:    Oral Hygiene is also important to reduce your risk of infection.  Remember - BRUSH YOUR TEETH THE MORNING OF SURGERY WITH YOUR REGULAR TOOTHPASTE

## 2020-11-15 ENCOUNTER — Encounter (HOSPITAL_COMMUNITY)
Admission: RE | Disposition: A | Discharge status: Skilled Nursing Facility | Payer: Self-pay | Source: Home / Self Care | Attending: Gastroenterology

## 2020-11-15 ENCOUNTER — Ambulatory Visit (HOSPITAL_COMMUNITY): Payer: Medicare Other | Admitting: Physician Assistant

## 2020-11-15 ENCOUNTER — Ambulatory Visit (HOSPITAL_COMMUNITY)
Admission: RE | Admit: 2020-11-15 | Discharge: 2020-11-15 | Disposition: A | Discharge status: Skilled Nursing Facility | Payer: Medicare Other | Attending: Gastroenterology | Admitting: Gastroenterology

## 2020-11-15 ENCOUNTER — Encounter (HOSPITAL_COMMUNITY): Payer: Self-pay | Admitting: Gastroenterology

## 2020-11-15 DIAGNOSIS — D509 Iron deficiency anemia, unspecified: Secondary | ICD-10-CM | POA: Insufficient documentation

## 2020-11-15 DIAGNOSIS — K2950 Unspecified chronic gastritis without bleeding: Secondary | ICD-10-CM | POA: Insufficient documentation

## 2020-11-15 DIAGNOSIS — K644 Residual hemorrhoidal skin tags: Secondary | ICD-10-CM | POA: Diagnosis not present

## 2020-11-15 DIAGNOSIS — K922 Gastrointestinal hemorrhage, unspecified: Secondary | ICD-10-CM

## 2020-11-15 DIAGNOSIS — D132 Benign neoplasm of duodenum: Secondary | ICD-10-CM | POA: Diagnosis not present

## 2020-11-15 DIAGNOSIS — K635 Polyp of colon: Secondary | ICD-10-CM

## 2020-11-15 DIAGNOSIS — R195 Other fecal abnormalities: Secondary | ICD-10-CM | POA: Diagnosis present

## 2020-11-15 DIAGNOSIS — K317 Polyp of stomach and duodenum: Secondary | ICD-10-CM | POA: Diagnosis not present

## 2020-11-15 DIAGNOSIS — D125 Benign neoplasm of sigmoid colon: Secondary | ICD-10-CM | POA: Insufficient documentation

## 2020-11-15 DIAGNOSIS — K209 Esophagitis, unspecified without bleeding: Secondary | ICD-10-CM

## 2020-11-15 DIAGNOSIS — K641 Second degree hemorrhoids: Secondary | ICD-10-CM | POA: Diagnosis not present

## 2020-11-15 DIAGNOSIS — K2970 Gastritis, unspecified, without bleeding: Secondary | ICD-10-CM

## 2020-11-15 DIAGNOSIS — K449 Diaphragmatic hernia without obstruction or gangrene: Secondary | ICD-10-CM | POA: Diagnosis not present

## 2020-11-15 DIAGNOSIS — K259 Gastric ulcer, unspecified as acute or chronic, without hemorrhage or perforation: Secondary | ICD-10-CM

## 2020-11-15 DIAGNOSIS — Z8673 Personal history of transient ischemic attack (TIA), and cerebral infarction without residual deficits: Secondary | ICD-10-CM | POA: Diagnosis not present

## 2020-11-15 DIAGNOSIS — R970 Elevated carcinoembryonic antigen [CEA]: Secondary | ICD-10-CM

## 2020-11-15 HISTORY — PX: FLEXIBLE SIGMOIDOSCOPY: SHX5431

## 2020-11-15 HISTORY — PX: ESOPHAGOGASTRODUODENOSCOPY (EGD) WITH PROPOFOL: SHX5813

## 2020-11-15 HISTORY — PX: SUBMUCOSAL TATTOO INJECTION: SHX6856

## 2020-11-15 HISTORY — PX: HEMOSTASIS CLIP PLACEMENT: SHX6857

## 2020-11-15 HISTORY — PX: POLYPECTOMY: SHX5525

## 2020-11-15 HISTORY — PX: ENDOSCOPIC MUCOSAL RESECTION: SHX6839

## 2020-11-15 HISTORY — PX: SUBMUCOSAL LIFTING INJECTION: SHX6855

## 2020-11-15 HISTORY — DX: Dependence on wheelchair: Z99.3

## 2020-11-15 HISTORY — DX: Dependence on supplemental oxygen: Z99.81

## 2020-11-15 LAB — GLUCOSE, CAPILLARY
Glucose-Capillary: 124 mg/dL — ABNORMAL HIGH (ref 70–99)
Glucose-Capillary: 147 mg/dL — ABNORMAL HIGH (ref 70–99)

## 2020-11-15 SURGERY — ESOPHAGOGASTRODUODENOSCOPY (EGD) WITH PROPOFOL
Anesthesia: Monitor Anesthesia Care

## 2020-11-15 MED ORDER — METOPROLOL TARTRATE 5 MG/5ML IV SOLN
5.0000 mg | Freq: Once | INTRAVENOUS | Status: AC
  Administered 2020-11-15: 5 mg via INTRAVENOUS

## 2020-11-15 MED ORDER — SODIUM CHLORIDE 0.9 % IV SOLN: INTRAVENOUS | Status: DC

## 2020-11-15 MED ORDER — APIXABAN 5 MG PO TABS: 5.0000 mg | ORAL_TABLET | Freq: Two times a day (BID) | ORAL | 2 refills | Status: DC

## 2020-11-15 MED ORDER — METOPROLOL TARTRATE 5 MG/5ML IV SOLN
INTRAVENOUS | Status: AC
  Filled 2020-11-15: qty 5

## 2020-11-15 MED ORDER — PROPOFOL 10 MG/ML IV BOLUS
INTRAVENOUS | Status: DC | PRN
  Administered 2020-11-15 (×2): 20 mg via INTRAVENOUS

## 2020-11-15 MED ORDER — SPOT INK MARKER SYRINGE KIT
PACK | SUBMUCOSAL | Status: AC
  Filled 2020-11-15: qty 5

## 2020-11-15 MED ORDER — GLUCAGON HCL RDNA (DIAGNOSTIC) 1 MG IJ SOLR
INTRAMUSCULAR | Status: DC | PRN
  Administered 2020-11-15 (×2): .25 mg via INTRAVENOUS

## 2020-11-15 MED ORDER — LACTATED RINGERS IV SOLN: INTRAVENOUS | Status: DC | PRN

## 2020-11-15 MED ORDER — PANTOPRAZOLE SODIUM 40 MG PO TBEC: 40.0000 mg | DELAYED_RELEASE_TABLET | Freq: Two times a day (BID) | ORAL | 3 refills | Status: AC

## 2020-11-15 MED ORDER — PROPOFOL 500 MG/50ML IV EMUL
INTRAVENOUS | Status: DC | PRN
  Administered 2020-11-15 (×2): 100 ug/kg/min via INTRAVENOUS

## 2020-11-15 MED ORDER — SPOT INK MARKER SYRINGE KIT
PACK | SUBMUCOSAL | Status: DC | PRN
  Administered 2020-11-15: 5 mL via SUBMUCOSAL

## 2020-11-15 SURGICAL SUPPLY — 24 items

## 2020-11-15 NOTE — Transfer of Care (Signed)
Immediate Anesthesia Transfer of Care Note  Patient: JASHER BARKAN  Procedure(s) Performed: COLONOSCOPY WITH PROPOFOL ESOPHAGOGASTRODUODENOSCOPY (EGD) WITH PROPOFOL ENDOSCOPIC MUCOSAL RESECTION SUBMUCOSAL TATTOO INJECTION HEMOSTASIS CLIP PLACEMENT  Patient Location: PACU  Anesthesia Type:MAC  Level of Consciousness: drowsy and patient cooperative  Airway & Oxygen Therapy: Patient Spontanous Breathing and Patient connected to face mask oxygen  Post-op Assessment: Report given to RN and Post -op Vital signs reviewed and stable  Post vital signs: Reviewed  Last Vitals:  Vitals Value Taken Time  BP    Temp    Pulse 107 11/15/20 1036  Resp 18 11/15/20 1036  SpO2 100 % 11/15/20 1036  Vitals shown include unvalidated device data.  Last Pain:  Vitals:   11/15/20 0700  TempSrc: Temporal  PainSc: 0-No pain         Complications: No notable events documented.

## 2020-11-15 NOTE — Op Note (Signed)
[facility] Patient Name: Daniel [name] Procedure Date : 11/15/2020 MRN: [id number] Attending MD: Justice Britain , MD Date of Birth: 03/11/1951 CSN: [id number] Age: 70 Admit Type: Outpatient Procedure:                Colonoscopy Indications:              Heme positive stool, Iron deficiency anemia Providers:                Justice Britain, MD, Laverda Sorenson, Technician,                            Luciana Axe, CRNA Referring MD:             [name], Leonie Douglas Medicines:                Monitored Anesthesia Care Complications:            No immediate complications. Estimated Blood Loss:     Estimated blood loss was minimal. Procedure:                Pre-Anesthesia Assessment:                           - Prior to the procedure, a History and Physical                            was performed, and patient medications and                            allergies were reviewed. The patient's tolerance of                            previous anesthesia was also reviewed. The risks                            and benefits of the procedure and the sedation                            options and risks were discussed with the patient.                            All questions were answered, and informed consent                            was obtained. Prior Anticoagulants: The patient has                            taken Eliquis (apixaban), last dose was 2 days                            prior to procedure. ASA Grade Assessment: III - A                            patient with severe systemic disease. After  reviewing the risks and benefits, the patient was                            deemed in satisfactory condition to undergo the                            procedure.                           After obtaining informed consent, the colonoscope                            was passed under direct vision. Throughout the                             procedure, the patient's blood pressure, pulse, and                            oxygen saturations were monitored continuously. The                            PCF-H190DL ([id number] Olympus pediatric colonoscope                            was introduced through the anus with the intention                            of advancing to the cecum. The scope was advanced                            to the ascending colon before the procedure was                            aborted. Medications were given. The colonoscopy                            was performed without difficulty. The patient                            tolerated the procedure. The quality of the bowel                            preparation was unsatisfactory. Scope In: 9:51:29 AM Scope Out: 10:15:08 AM Total Procedure Duration: 0 hours 23 minutes 39 seconds  Findings:      Before we started the procedure (after EGD), the patient had copious       amounts of stool and this was dark brown in quality and full of       semi-solid stool.      The digital rectal exam findings include hemorrhoids. Pertinent       negatives include no palpable rectal lesions.      Query possible internal angulation within the sigmoid colon with       significant dilation of the entire colon thereafter.      Copious quantities of semi-liquid semi-solid stool was found  in the       entire colon, precluding visualization. Lavage of the area was performed       using copious amounts, resulting in incomplete clearance with continued       poor visualization.      A 8 mm polyp was found in the sigmoid colon. The polyp was semi-sessile.       The polyp was removed with a cold snare. Resection and retrieval were       complete.      Non-bleeding non-thrombosed external and internal hemorrhoids were found       during retroflexion, during perianal exam and during digital exam. The       hemorrhoids were Grade II (internal hemorrhoids that prolapse but reduce        spontaneously). Impression:               - Stool in the entire examined colon. Preparation                            of the colon was unsatisfactory even after copious                            lavage.                           - Hemorrhoids found on digital rectal exam.                           - One 8 mm polyp in the sigmoid colon, removed with                            a cold snare. Resected and retrieved.                           - Non-bleeding non-thrombosed external and internal                            hemorrhoids. Recommendation:           - The patient will be observed post-procedure,                            until all discharge criteria are met.                           - Discharge patient to a nursing home.                           - Patient has a contact number available for                            emergencies. The signs and symptoms of potential                            delayed complications were discussed with the                            patient.  Return to normal activities tomorrow.                            Written discharge instructions were provided to the                            patient.                           - Full liquid diet today due to findings at time of                            EGD and then may advance tomorrow.                           - Continue present medications.                           - Await pathology results.                           - Repeat colonoscopy next available with primary GI                            for completion of workup of anemia as polyps could                            have been missed due to the bowel preparation was                            suboptimal. Recommend a 2-day preparation and 1                            week of Miralax daily at minimum.                           - The findings and recommendations were discussed                            with the patient.                           - The  findings and recommendations were discussed                            with the designated responsible adult. Procedure Code(s):        --- Professional ---                           [phone], 52, Colonoscopy, flexible; with removal of                            tumor(s), polyp(s), or other lesion(s) by snare  technique Diagnosis Code(s):        --- Professional ---                           K64.1, Second degree hemorrhoids                           K63.5, Polyp of colon                           R19.5, Other fecal abnormalities                           D50.9, Iron deficiency anemia, unspecified CPT copyright 2019 American Medical [employer]. All rights reserved. The codes documented in this report are preliminary and upon coder review may  be revised to meet current compliance requirements. Justice Britain, MD 11/15/2020 10:40:59 AM Number of Addenda: 0

## 2020-11-15 NOTE — Op Note (Signed)
[facility] Patient Name: Daniel [name] Procedure Date : 11/15/2020 MRN: [id number] Attending MD: Justice Britain , MD Date of Birth: September 15, 1950 CSN: [id number] Age: 70 Admit Type: Outpatient Procedure:                Upper GI endoscopy Indications:              Anemia, Recent gastrointestinal bleeding, Polyps in                            the duodenum, For therapy of polyps in the duodenum Providers:                Justice Britain, MD, Baird Cancer, RN, Laverda Sorenson, Technician, Luciana Axe, CRNA Referring MD:             [name], Leonie Douglas Medicines:                Monitored Anesthesia Care Complications:            No immediate complications. Estimated Blood Loss:     Estimated blood loss was minimal. Procedure:                Pre-Anesthesia Assessment:                           - Prior to the procedure, a History and Physical                            was performed, and patient medications and                            allergies were reviewed. The patient's tolerance of                            previous anesthesia was also reviewed. The risks                            and benefits of the procedure and the sedation                            options and risks were discussed with the patient.                            All questions were answered, and informed consent                            was obtained. Prior Anticoagulants: The patient has                            taken Eliquis (apixaban), last dose was 2 days                            prior to procedure. ASA Grade Assessment: III - A  patient with severe systemic disease. After                            reviewing the risks and benefits, the patient was                            deemed in satisfactory condition to undergo the                            procedure.                           After obtaining informed consent, the  endoscope was                            passed under direct vision. Throughout the                            procedure, the patient's blood pressure, pulse, and                            oxygen saturations were monitored continuously. The                            GIF-H190 ([id number]) Olympus gastroscope was                            introduced through the mouth, and advanced to the                            third part of duodenum. The upper GI endoscopy was                            accomplished without difficulty. The patient                            tolerated the procedure. Scope In: Scope Out: Findings:      No gross lesions were noted in the entire esophagus.      The Z-line was regular and was found 36 cm from the incisors.      A 4 cm hiatal hernia was present.      An angulation deformity was found in the gastric body.      Patchy moderate inflammation characterized by congestion (edema),       erosions, erythema and granularity was found in the gastric body, at the       incisura and in the gastric antrum.      Medium healed ulcer scars were found in the gastric body and incisura.      No other gross lesions were noted in the entire examined stomach.       Biopsies were taken with a cold forceps for histology and Helicobacter       pylori testing.      No gross lesions were noted in the duodenal bulb.      A single 25 mm semi-sessile polyp with no bleeding was found in the  second portion of the duodenum. Preparations were made for mucosal       resection. NBI imaging and White-light endoscopy was done to demarcate       the borders of the lesion. Orise gel was injected to raise the lesion.       Piecemeal mucosal resection using a snare was performed. Resection and       retrieval were complete. To prevent bleeding after cold mucosal       resection, six hemostatic clips were successfully placed (MR       conditional). There was no bleeding at the end of the  procedure. Area       just proximal was tattooed with an injection of Spot (carbon black) for       demarcation purposes.      A single 6 mm sessile polyp with no bleeding was found in the third       portion of the duodenum. The polyp was removed with a cold snare.       Resection and retrieval were complete. To prevent bleeding after the       polypectomy, two hemostatic clips were successfully placed (MR       conditional). There was no bleeding at the end of the procedure. Area       just proximal was tattooed with an injection of Spot (carbon black). Impression:               - No gross lesions in esophagus. Z-line regular, 36                            cm from the incisors.                           - 4 cm hiatal hernia.                           - Angulation deformity of the gastric body.                           - Moderate Gastritis. Scars from healed ulcers in                            the gastric body. No other gross lesions in the                            stomach. Biopsied.                           - No gross lesions in the duodenal bulb.                           - A single duodenal polyp. Piecemeal Cold EMR                            performed. Retrieved. Clips (MR conditional) were                            placed. Tattooed proximally.                           -  A single duodenal polyp. Resected and retrieved.                            Clips (MR conditional) were placed. Tattooed                            proximally. Recommendation:           - Proceed to scheduled colonoscopy.                           - Increase to Protonix 40 mg twice daily for next                            73-months and then may decrease back to once daily.                           - Observe patient's clinical course.                           - Await pathology results.                           - Repeat upper endoscopy in 9-12 months for                            surveillance of region.                            - Unclear if patient will be restarting                            anticoagulation or not. If he will be then would                            hold Eliquis for 72 hours and restart on 6/12 AM to                            try to decrease risk of post-interventional                            bleeding.                           - The findings and recommendations were discussed                            with the patient.                           - The findings and recommendations were discussed                            with the designated responsible adult. Procedure Code(s):        --- Professional ---  43254, Esophagogastroduodenoscopy, flexible,                            transoral; with endoscopic mucosal resection Diagnosis Code(s):        --- Professional ---                           K44.9, Diaphragmatic hernia without obstruction or                            gangrene                           K31.89, Other diseases of stomach and duodenum                           K29.70, Gastritis, unspecified, without bleeding                           K31.7, Polyp of stomach and duodenum                           D50.9, Iron deficiency anemia, unspecified                           K92.2, Gastrointestinal hemorrhage, unspecified CPT copyright 2019 American Medical Association. All rights reserved. The codes documented in this report are preliminary and upon coder review may  be revised to meet current compliance requirements. Justice Britain, MD 11/15/2020 10:33:39 AM Number of Addenda: 0

## 2020-11-15 NOTE — H&P (Signed)
GASTROENTEROLOGY PROCEDURE H&P NOTE   Primary Care Physician: Leonie Douglas, MD  HPI: Daniel [name] is a 70 y.o. male who presents for EGD with possible EUS and Colonoscopy.  Evaluation of Duodenal mass/lesion and Colonoscopy for abnormal CT and anemia.  Past Medical History:  Diagnosis Date   Acute kidney failure (HCC)    Acute respiratory failure (HCC)    Adenomatous polyp of stomach    Anemia    Benign prostate hyperplasia    Dementia (HCC)    Depression    Diabetes mellitus without complication (HCC)    DVT (deep venous thrombosis) (HCC)    Gastric ulcer    GERD (gastroesophageal reflux disease)    High cholesterol    Hypertension    Hypo-osmolality and hyponatremia    Insomnia    Neuromuscular dysfunction of bladder    Osteoarthritis    Partial intestinal obstruction (HCC)    Pneumonitis    Protein-calorie malnutrition, moderate ([place])    Stroke ([place])    Supplemental oxygen dependent    24/7   Wheelchair bound    Past Surgical History:  Procedure Laterality Date   BIOPSY  06/21/2020   Procedure: BIOPSY;  Surgeon: Daneil Dolin, MD;  [facility];  Service: Endoscopy;;  gastric   BIOPSY  09/18/2020   Procedure: BIOPSY;  Surgeon: Harvel Quale, MD;  [facility];  Service: Gastroenterology;;   ESOPHAGOGASTRODUODENOSCOPY (EGD) WITH PROPOFOL N/A 06/21/2020   Procedure: ESOPHAGOGASTRODUODENOSCOPY (EGD) WITH PROPOFOL;  Surgeon: Daneil Dolin, MD;  [facility];  Service: Endoscopy;  Laterality: N/A;   ESOPHAGOGASTRODUODENOSCOPY (EGD) WITH PROPOFOL N/A 09/18/2020   Procedure: ESOPHAGOGASTRODUODENOSCOPY (EGD) WITH PROPOFOL;  Surgeon: Harvel Quale, MD;  [facility];  Service: Gastroenterology;  Laterality: N/A;   HIP SURGERY Left    KIDNEY SURGERY     kidney removed   Current Facility-Administered Medications  Medication Dose Route Frequency Provider Last Rate Last Admin   0.9 %  sodium  chloride infusion   Intravenous Continuous Mansouraty, Telford Nab., MD       No Known Allergies Family History  Problem Relation Age of Onset   Hypertension Sister    Hyperlipidemia Sister    Osteoarthritis Sister    Rheum arthritis Sister    Breast cancer Niece    Colon cancer Neg Hx    Esophageal cancer Neg Hx    Inflammatory bowel disease Neg Hx    Liver disease Neg Hx    Pancreatic cancer Neg Hx    Stomach cancer Neg Hx    Rectal cancer Neg Hx    Social History   Socioeconomic History   Marital status: Single    Spouse name: Not on file   Number of children: Not on file   Years of education: Not on file   Highest education level: Not on file  Occupational History   Not on file  Tobacco Use   Smoking status: Never   Smokeless tobacco: Never  Vaping Use   Vaping Use: Never used  Substance and Sexual Activity   Alcohol use: No   Drug use: No   Sexual activity: Never  Other Topics Concern   Not on file  Social History Narrative   Not on file   Social Determinants of Health   Financial Resource Strain: Not on file  Food Insecurity: Not on file  Transportation Needs: Not on file  Physical Activity: Not on file  Stress: Not on file  Social Connections: Not on file  Intimate Partner Violence: Not At Risk   Fear of Current or Ex-Partner: No   Emotionally Abused: No   Physically Abused: No   Sexually Abused: No    Physical Exam: Vital signs in last 24 hours: Temp:  [97 F (36.1 C)] 97 F (36.1 C) (06/09 0700) Pulse Rate:  [94] 94 (06/09 0700) Resp:  [16] 16 (06/09 0700) BP: (155)/(84) 155/84 (06/09 0700) SpO2:  [100 %] 100 % (06/09 0700) Weight:  [66.2 kg] 66.2 kg (06/09 0700)   GEN: NAD EYE: Sclerae anicteric ENT: MMM CV: Non-tachycardic GI: Soft, NT/ND NEURO:  Alert & Oriented x 3  Lab Results: No results for input(s): WBC, HGB, HCT, PLT in the last 72 hours. BMET No results for input(s): NA, K, CL, CO2, GLUCOSE, BUN, CREATININE, CALCIUM in  the last 72 hours. LFT No results for input(s): PROT, ALBUMIN, AST, ALT, ALKPHOS, BILITOT, BILIDIR, IBILI in the last 72 hours. PT/INR No results for input(s): LABPROT, INR in the last 72 hours.   Impression / Plan: This is a 70 y.o.malewho presents for EGD with possible EUS and Colonoscopy.  Evaluation of Duodenal mass/lesion and Colonoscopy for abnormal CT and anemia.  The risks of an EUS including intestinal perforation, bleeding, infection, aspiration, and medication effects were discussed as was the possibility it may not give a definitive diagnosis if a biopsy is performed.  When a biopsy of the pancreas is done as part of the EUS, there is an additional risk of pancreatitis at the rate of about 1-2%.  It was explained that procedure related pancreatitis is typically mild, although it can be severe and even life threatening, which is why we do not perform random pancreatic biopsies and only biopsy a lesion/area we feel is concerning enough to warrant the risk.  The risks and benefits of endoscopic evaluation were discussed with the patient; these include but are not limited to the risk of perforation, infection, bleeding, missed lesions, lack of diagnosis, severe illness requiring hospitalization, as well as anesthesia and sedation related illnesses.  The patient's HCPOA is agreeable to proceed.    Justice Britain, MD [facility] Advanced Endoscopy Office # [phone]

## 2020-11-15 NOTE — Anesthesia Procedure Notes (Addendum)
Procedure Name: MAC Date/Time: 11/15/2020 8:45 AM Performed by: Jenne Campus, CRNA Pre-anesthesia Checklist: Patient identified, Emergency Drugs available, Suction available, Patient being monitored and Timeout performed Patient Re-evaluated:Patient Re-evaluated prior to induction Oxygen Delivery Method: Simple face mask Preoxygenation: Pre-oxygenation with 100% oxygen Airway Equipment and Method: Bite block Placement Confirmation: positive ETCO2 Dental Injury: Teeth and Oropharynx as per pre-operative assessment  Comments: Procedural oxygen mask with bite block

## 2020-11-15 NOTE — Anesthesia Preprocedure Evaluation (Addendum)
Anesthesia Evaluation  Patient identified by MRN, date of birth, ID band Patient awake    Reviewed: Allergy & Precautions, NPO status , Patient's Chart, lab work & pertinent test results  Airway Mallampati: III  TM Distance: >3 FB Neck ROM: Full    Dental  (+) Poor Dentition, Missing, Dental Advisory Given   Pulmonary  Chronic hypoxic resp failure- on oxygen 24/7   Pulmonary exam normal breath sounds clear to auscultation       Cardiovascular hypertension, Pt. on medications +CHF (greade 1 diastolic dysfunction, normal LVEF) and + DVT  Normal cardiovascular exam Rhythm:Regular Rate:Normal  Echo 09/2020: 1. Abnormal septal motion . Left ventricular ejection fraction, by  estimation, is 50 to 55%. The left ventricle has low normal function. The  left ventricle has no regional wall motion abnormalities. There is mild  left ventricular hypertrophy. Left  ventricular diastolic parameters are consistent with Grade I diastolic  dysfunction (impaired relaxation).  2. Right ventricular systolic function is normal. The right ventricular  size is normal.  3. A small pericardial effusion is present. The pericardial effusion is  posterior to the left ventricle.  4. The mitral valve is abnormal. Trivial mitral valve regurgitation. No  evidence of mitral stenosis.  5. The aortic valve is tricuspid. Aortic valve regurgitation is not  visualized. No aortic stenosis is present.  6. The inferior vena cava is normal in size with greater than 50%  respiratory variability, suggesting right atrial pressure of 3 mmHg.    Neuro/Psych PSYCHIATRIC DISORDERS (significant dementia, AO x 0 ) Depression Dementia CVA (eliquis)    GI/Hepatic Neg liver ROS, PUD, GERD  Controlled and Medicated,Duodenal polyps, elevated CEA   Endo/Other  diabetes, Well Controlled, Type 2, Oral Hypoglycemic Agents, Insulin Dependenta1c 7.1  Renal/GU negative Renal ROS  Bladder dysfunction      Musculoskeletal  (+) Arthritis , Osteoarthritis,    Abdominal   Peds  Hematology  (+) Blood dyscrasia, anemia , H/ H 10/34   Anesthesia Other Findings   Reproductive/Obstetrics negative OB ROS                            Anesthesia Physical Anesthesia Plan  ASA: IV  Anesthesia Plan: MAC   Post-op Pain Management:    Induction:   PONV Risk Score and Plan: 2 and Propofol infusion and TIVA  Airway Management Planned: Natural Airway and Simple Face Mask  Additional Equipment: None  Intra-op Plan:   Post-operative Plan:   Informed Consent: I have reviewed the patients History and Physical, chart, labs and discussed the procedure including the risks, benefits and alternatives for the proposed anesthesia with the patient or authorized representative who has indicated his/her understanding and acceptance.     Dental advisory given and Consent reviewed with POA  Plan Discussed with: CRNA  Anesthesia Plan Comments: (Legal guardian at bedside, d/w her)       Anesthesia Quick Evaluation

## 2020-11-15 NOTE — Anesthesia Postprocedure Evaluation (Signed)
Anesthesia Post Note  Patient: BERKLEY CRONKRIGHT  Procedure(s) Performed: ESOPHAGOGASTRODUODENOSCOPY (EGD) WITH PROPOFOL ENDOSCOPIC MUCOSAL RESECTION SUBMUCOSAL TATTOO INJECTION HEMOSTASIS CLIP PLACEMENT POLYPECTOMY [place]     Patient location during evaluation: PACU Anesthesia Type: MAC Level of consciousness: awake and alert Pain management: pain level controlled Vital Signs Assessment: post-procedure vital signs reviewed and stable Respiratory status: spontaneous breathing and respiratory function stable Cardiovascular status: stable Postop Assessment: no apparent nausea or vomiting Anesthetic complications: no   No notable events documented.  Last Vitals:  Vitals:   11/15/20 1130 11/15/20 1138  BP: (!) 170/91 (!) 161/87  Pulse: 78 67  Resp: 12 17  Temp:    SpO2: 100% 100%    Last Pain:  Vitals:   11/15/20 1120  TempSrc:   PainSc: 0-No pain                 Tanicia Wolaver,Moises DANIEL

## 2020-11-16 ENCOUNTER — Encounter: Payer: Self-pay | Admitting: Gastroenterology

## 2020-11-16 ENCOUNTER — Encounter (HOSPITAL_COMMUNITY): Payer: Self-pay | Admitting: Gastroenterology

## 2020-11-16 LAB — SURGICAL PATHOLOGY

## 2020-11-20 ENCOUNTER — Other Ambulatory Visit (INDEPENDENT_AMBULATORY_CARE_PROVIDER_SITE_OTHER): Payer: Self-pay

## 2020-11-20 ENCOUNTER — Telehealth: Payer: Self-pay | Admitting: *Deleted

## 2020-11-20 DIAGNOSIS — D509 Iron deficiency anemia, unspecified: Secondary | ICD-10-CM

## 2020-11-20 NOTE — Telephone Encounter (Signed)
OUR OFFICE RECEIVED A CLEARANCE REQUEST TODAY FOR THE PT. PT HS NEVER BEEN SEEN BY OUR PRACTICE. PT WILL NEED A NEW PT APPT. LOOKS LIKE PT LIVES IN [place] AND CLEARANCE IS COMING FROM [facility]. I WILL SEND A MESSAGE TO BOTH [place] AND [place] SCHEDULING TO ASSIST IN SCHEDULING THE PT A NEW PT APPT FOR PRE OP CLEARANCE.     [facility] Pre-operative Risk Assessment    Patient Name: Daniel [name]  DOB: May 05, 1951  MRN: [id number]   [facility] STAFF: - Please ensure there is not already an duplicate clearance open for this procedure. - Under Visit Info/Reason for Call, type in Other and utilize the format Clearance MM/DD/YY or Clearance TBD. Do not use dashes or single digits. - If request is for dental extraction, please clarify the # of teeth to be extracted. - If the patient is currently at the dentist's office, call Pre-Op APP to address. If the patient is not currently in the dentist office, please route to the Pre-Op pool  Request for surgical clearance: PAPER CLEARANCE HAS BEEN GIVEN TO CHART PREP TEAM AT [facility].   What type of surgery is being performed? COLONOSCOPY   When is this surgery scheduled? 12/14/20   What type of clearance is required (medical clearance vs. Pharmacy clearance to hold med vs. Both)? BOTH  Are there any medications that need to be held prior to surgery and how long? ELIQUIS x 2 DAYS PER CLEARANCE REQUEST   Practice name and name of physician performing surgery? [facility]; DR. Jenetta Downer   What is the office phone number? [phone]   7.   What is the office fax number? [phone]  8.   Anesthesia type (None, local, MAC, general) ? MAC   Julaine Hua 11/20/2020, 5:48 PM  _________________________________________________________________   (provider comments below)

## 2020-11-21 ENCOUNTER — Encounter (INDEPENDENT_AMBULATORY_CARE_PROVIDER_SITE_OTHER): Payer: Self-pay

## 2020-11-21 ENCOUNTER — Telehealth (INDEPENDENT_AMBULATORY_CARE_PROVIDER_SITE_OTHER): Payer: Self-pay

## 2020-11-21 MED ORDER — PEG 3350-KCL-NA BICARB-NACL 420 G PO SOLR: 4000.0000 mL | ORAL | 0 refills | Status: DC

## 2020-11-21 MED ORDER — MAGNESIUM CITRATE PO SOLN: 1.0000 | Freq: Once | ORAL | 0 refills | Status: AC

## 2020-11-21 NOTE — Telephone Encounter (Signed)
LeighAnn Rachael Ferrie, CMA  

## 2020-11-22 ENCOUNTER — Ambulatory Visit (INDEPENDENT_AMBULATORY_CARE_PROVIDER_SITE_OTHER): Payer: Medicare Other | Admitting: Gastroenterology

## 2020-11-22 NOTE — Telephone Encounter (Signed)
Notes have been faxed to Dr. Colman Cater office.

## 2020-11-22 NOTE — Telephone Encounter (Signed)
See notes that I placed on clearance request the pt needs a NEW PT APPT. Per message from scheduler the first available to get the pt is 12/27/20. Pt will be placed on a waitlist as well. Pt has NEW PT APPT with Dr. Johnsie Cancel 12/27/20. Procedure will need to be postponed until pt has been cleared by cardiology. I will forward these notes to requesting office as FYI.

## 2020-11-22 NOTE — Telephone Encounter (Signed)
Hi Daniel [name], The patient will not need to have a clearance for this procedure but just hold the anticoagulation (Apixaban 5 mg ) 48 hours before his procedure, as he just had an EGD with Dr. Rush Landmark and he was cleared to do this. Thanks

## 2020-11-22 NOTE — Telephone Encounter (Signed)
Will forward to our pre op provider if further recommendations from cardiology.

## 2020-11-22 NOTE — Telephone Encounter (Signed)
Per scheduler has been scheduled sooner 12/13/20 with Dr. Harrington Challenger.

## 2020-11-22 NOTE — Telephone Encounter (Signed)
   Name:  Daniel [name]  DOB:  1950/09/03  MRN:  [id number]   Primary Cardiologist: None  Chart reviewed as part of pre-operative protocol coverage. This patient does not follow with [facility]. He has no cardiac hx and is on Eliquis for hx of CVA. I would recommend that anticoagulation clearance come from the patients PCP or neurology who manage this for him. We have set him up for a new patient appointment however I do not think there is much for cardiology to offer for his long term care.   Pre-op covering staff: - Please contact requesting surgeon's office via preferred method (i.e, phone, fax) to inform them of the above information.    Kathyrn Drown, NP 11/22/2020, 10:50 AM

## 2020-12-06 NOTE — Patient Instructions (Addendum)
Daniel [name]  12/06/2020     @PREFPERIOPPHARMACY @   Your procedure is scheduled on 12/14/20.  Report to [facility]M.  Call this number if you have problems the morning of surgery:  [phone]   Remember:  Do not eat or drink after midnight.  Follow your diet & prep instructions from office.   Take these medicines the morning of surgery with A SIP OF WATER aricept, namenda, reglan, metoprolol, ditropan, protonix. DO NOT take any diabetic medications the morning of your procedure.    Last Eliquis dose should be 12/11/20 pm dose.    Do not wear jewelry, make-up or nail polish.  Do not wear lotions, powders, or perfumes, or deodorant.  Do not shave 48 hours prior to surgery.  Men may shave face and neck.  Do not bring valuables to the hospital.  [facility] is not responsible for any belongings or valuables.  Contacts, dentures or bridgework may not be worn into surgery.  Leave your suitcase in the car.  After surgery it may be brought to your room.  For patients admitted to the hospital, discharge time will be determined by your treatment team.  Patients discharged the day of surgery will not be allowed to drive home.   Name and phone number of your driver:   family Special instructions:  Follow your diet & prep provided to you from the office.  Please read over the following fact sheets that you were given. Anesthesia Post-op Instructions and Care and Recovery After Surgery      Colonoscopy, Adult A colonoscopy is a procedure to look at the entire large intestine. This procedure is done using a long, thin, flexible tube that has a camera on theend. You may have a colonoscopy: As a part of normal colorectal screening. If you have certain symptoms, such as: A low number of red blood cells in your blood (anemia). Diarrhea that does not go away. Pain in your abdomen. Blood in your stool. A colonoscopy can help screen for and diagnose medical problems,  including: Tumors. Extra tissue that grows where mucus forms (polyps). Inflammation. Areas of bleeding. Tell your health care provider about: Any allergies you have. All medicines you are taking, including vitamins, herbs, eye drops, creams, and over-the-counter medicines. Any problems you or family members have had with anesthetic medicines. Any blood disorders you have. Any surgeries you have had. Any medical conditions you have. Any problems you have had with having bowel movements. Whether you are pregnant or may be pregnant. What are the risks? Generally, this is a safe procedure. However, problems may occur, including: Bleeding. Damage to your intestine. Allergic reactions to medicines given during the procedure. Infection. This is rare. What happens before the procedure? Eating and drinking restrictions Follow instructions from your health care provider about eating or drinking restrictions, which may include: A few days before the procedure: Follow a low-fiber diet. Avoid nuts, seeds, dried fruit, raw fruits, and vegetables. 1-3 days before the procedure: Eat only gelatin dessert or ice pops. Drink only clear liquids, such as water, clear juice, clear broth or bouillon, black coffee or tea, or clear soft drinks or sports drinks. Avoid liquids that contain red or purple dye. The day of the procedure: Do not eat solid foods. You may continue to drink clear liquids until up to 2 hours before the procedure. Do not eat or drink anything starting 2 hours before the procedure, or within the time period that your health  care provider recommends. Bowel prep If you were prescribed a bowel prep to take by mouth (orally) to clean out your colon: Take it as told by your health care provider. Starting the day before your procedure, you will need to drink a large amount of liquid medicine. The liquid will cause you to have many bowel movements of loose stool until your stool becomes  almost clear or light green. If your skin or the opening between the buttocks (anus) gets irritated from diarrhea, you may relieve the irritation using: Wipes with medicine in them, such as adult wet wipes with aloe and vitamin E. A product to soothe skin, such as petroleum jelly. If you vomit while drinking the bowel prep: Take a break for up to 60 minutes. Begin the bowel prep again. Call your health care provider if you keep vomiting or you cannot take the bowel prep without vomiting. To clean out your colon, you may also be given: Laxative medicines. These help you have a bowel movement. Instructions for enema use. An enema is liquid medicine injected into your rectum. Medicines Ask your health care provider about: Changing or stopping your regular medicines or supplements. This is especially important if you are taking iron supplements, diabetes medicines, or blood thinners. Taking medicines such as aspirin and ibuprofen. These medicines can thin your blood. Do not take these medicines unless your health care provider tells you to take them. Taking over-the-counter medicines, vitamins, herbs, and supplements. General instructions Ask your health care provider what steps will be taken to help prevent infection. These may include washing skin with a germ-killing soap. Plan to have someone take you home from the hospital or clinic. What happens during the procedure?  An IV will be inserted into one of your veins. You may be given one or more of the following: A medicine to help you relax (sedative). A medicine to numb the area (local anesthetic). A medicine to make you fall asleep (general anesthetic). This is rarely needed. You will lie on your side with your knees bent. The tube will: Have oil or gel put on it (be lubricated). Be inserted into your anus. Be gently eased through all parts of your large intestine. Air will be sent into your colon to keep it open. This may cause some  pressure or cramping. Images will be taken with the camera and will appear on a screen. A small tissue sample may be removed to be looked at under a microscope (biopsy). The tissue may be sent to a lab for testing if any signs of problems are found. If small polyps are found, they may be removed and checked for cancer cells. When the procedure is finished, the tube will be removed. The procedure may vary among health care providers and hospitals. What happens after the procedure? Your blood pressure, heart rate, breathing rate, and blood oxygen level will be monitored until you leave the hospital or clinic. You may have a small amount of blood in your stool. You may pass gas and have mild cramping or bloating in your abdomen. This is caused by the air that was used to open your colon during the exam. Do not drive for 24 hours after the procedure. It is up to you to get the results of your procedure. Ask your health care provider, or the department that is doing the procedure, when your results will be ready. Summary A colonoscopy is a procedure to look at the entire large intestine. Follow instructions from your  health care provider about eating and drinking before the procedure. If you were prescribed an oral bowel prep to clean out your colon, take it as told by your health care provider. During the colonoscopy, a flexible tube with a camera on its end is inserted into the anus and then passed into the other parts of the large intestine. This information is not intended to replace advice given to you by your health care provider. Make sure you discuss any questions you have with your healthcare provider. Document Revised: 12/17/2018 Document Reviewed: 12/17/2018 Elsevier Patient Education  [place]. Colonoscopy, Adult, Care After This sheet gives you information about how to care for yourself after your procedure. Your health care provider may also give you more specific  instructions. If you have problems or questions, contact your health careprovider. What can I expect after the procedure? After the procedure, it is common to have: A small amount of blood in your stool for 24 hours after the procedure. Some gas. Mild cramping or bloating of your abdomen. Follow these instructions at home: Eating and drinking  Drink enough fluid to keep your urine pale yellow. Follow instructions from your health care provider about eating or drinking restrictions. Resume your normal diet as instructed by your health care provider. Avoid heavy or fried foods that are hard to digest.  Activity Rest as told by your health care provider. Avoid sitting for a long time without moving. Get up to take short walks every 1-2 hours. This is important to improve blood flow and breathing. Ask for help if you feel weak or unsteady. Return to your normal activities as told by your health care provider. Ask your health care provider what activities are safe for you. Managing cramping and bloating  Try walking around when you have cramps or feel bloated. Apply heat to your abdomen as told by your health care provider. Use the heat source that your health care provider recommends, such as a moist heat pack or a heating pad. Place a towel between your skin and the heat source. Leave the heat on for 20-30 minutes. Remove the heat if your skin turns bright red. This is especially important if you are unable to feel pain, heat, or cold. You may have a greater risk of getting burned.  General instructions If you were given a sedative during the procedure, it can affect you for several hours. Do not drive or operate machinery until your health care provider says that it is safe. For the first 24 hours after the procedure: Do not sign important documents. Do not drink alcohol. Do your regular daily activities at a slower pace than normal. Eat soft foods that are easy to digest. Take  over-the-counter and prescription medicines only as told by your health care provider. Keep all follow-up visits as told by your health care provider. This is important. Contact a health care provider if: You have blood in your stool 2-3 days after the procedure. Get help right away if you have: More than a small spotting of blood in your stool. Large blood clots in your stool. Swelling of your abdomen. Nausea or vomiting. A fever. Increasing pain in your abdomen that is not relieved with medicine. Summary After the procedure, it is common to have a small amount of blood in your stool. You may also have mild cramping and bloating of your abdomen. If you were given a sedative during the procedure, it can affect you for several hours. Do not drive  or operate machinery until your health care provider says that it is safe. Get help right away if you have a lot of blood in your stool, nausea or vomiting, a fever, or increased pain in your abdomen. This information is not intended to replace advice given to you by your health care provider. Make sure you discuss any questions you have with your healthcare provider. Document Revised: 05/20/2019 Document Reviewed: 12/20/2018 Elsevier Patient Education  [place] POST-ANESTHESIA  IMMEDIATELY FOLLOWING SURGERY:  Do not drive or operate machinery for the first twenty four hours after surgery.  Do not make any important decisions for twenty four hours after surgery or while taking narcotic pain medications or sedatives.  If you develop intractable nausea and vomiting or a severe headache please notify your doctor immediately.  FOLLOW-UP:  Please make an appointment with your surgeon as instructed. You do not need to follow up with anesthesia unless specifically instructed to do so.  WOUND CARE INSTRUCTIONS (if applicable):  Keep a dry clean dressing on the anesthesia/puncture wound site if there is drainage.  Once the  wound has quit draining you may leave it open to air.  Generally you should leave the bandage intact for twenty four hours unless there is drainage.  If the epidural site drains for more than 36-48 hours please call the anesthesia department.  QUESTIONS?:  Please feel free to call your physician or the hospital operator if you have any questions, and they will be happy to assist you.      Monitored Anesthesia Care, Care After This sheet gives you information about how to care for yourself after your procedure. Your health care provider may also give you more specific instructions. If you have problems or questions, contact your health careprovider. What can I expect after the procedure? After the procedure, it is common to have: Tiredness. Forgetfulness about what happened after the procedure. Impaired judgment for important decisions. Nausea or vomiting. Some difficulty with balance. Follow these instructions at home: For the time period you were told by your health care provider:     Rest as needed. Do not participate in activities where you could fall or become injured. Do not drive or use machinery. Do not drink alcohol. Do not take sleeping pills or medicines that cause drowsiness. Do not make important decisions or sign legal documents. Do not take care of children on your own. Eating and drinking Follow the diet that is recommended by your health care provider. Drink enough fluid to keep your urine pale yellow. If you vomit: Drink water, juice, or soup when you can drink without vomiting. Make sure you have little or no nausea before eating solid foods. General instructions Have a responsible adult stay with you for the time you are told. It is important to have someone help care for you until you are awake and alert. Take over-the-counter and prescription medicines only as told by your health care provider. If you have sleep apnea, surgery and certain medicines can increase  your risk for breathing problems. Follow instructions from your health care provider about wearing your sleep device: Anytime you are sleeping, including during daytime naps. While taking prescription pain medicines, sleeping medicines, or medicines that make you drowsy. Avoid smoking. Keep all follow-up visits as told by your health care provider. This is important. Contact a health care provider if: You keep feeling nauseous or you keep vomiting. You feel light-headed. You are still sleepy or having trouble with balance  after 24 hours. You develop a rash. You have a fever. You have redness or swelling around the IV site. Get help right away if: You have trouble breathing. You have new-onset confusion at home. Summary For several hours after your procedure, you may feel tired. You may also be forgetful and have poor judgment. Have a responsible adult stay with you for the time you are told. It is important to have someone help care for you until you are awake and alert. Rest as told. Do not drive or operate machinery. Do not drink alcohol or take sleeping pills. Get help right away if you have trouble breathing, or if you suddenly become confused. This information is not intended to replace advice given to you by your health care provider. Make sure you discuss any questions you have with your healthcare provider. Document Revised: 02/09/2020 Document Reviewed: 04/28/2019 Elsevier Patient Education  2022 [employer].

## 2020-12-07 ENCOUNTER — Encounter (HOSPITAL_COMMUNITY)
Admission: RE | Admit: 2020-12-07 | Discharge: 2020-12-07 | Disposition: A | Discharge status: Home / Self Care | Payer: 59 | Source: Ambulatory Visit | Attending: Gastroenterology | Admitting: Gastroenterology

## 2020-12-11 NOTE — Patient Instructions (Signed)
Daniel [name]  12/11/2020     @PREFPERIOPPHARMACY @   Your procedure is scheduled on  12/14/2020.   Report to [facility] at  0730  A.M.   Call this number if you have problems the morning of surgery:  [phone]   Remember:  Follow the diet and prep instructions given to you by the office.    Take these medicines the morning of surgery with A SIP OF WATER    marinol, namenda, reglan, metoprolo, zofran (if needed), oxybutynin, protonix, mestinon.  DO NOT take any medications for diabetes the morning of your procedure.     Do not wear jewelry, make-up or nail polish.  Do not wear lotions, powders, or perfumes, or deodorant.  Do not shave 48 hours prior to surgery.  Men may shave face and neck.  Do not bring valuables to the hospital.  [facility] is not responsible for any belongings or valuables.  Contacts, dentures or bridgework may not be worn into surgery.  Leave your suitcase in the car.  After surgery it may be brought to your room.  For patients admitted to the hospital, discharge time will be determined by your treatment team.  Patients discharged the day of surgery will not be allowed to drive home and must have someone with them for 24 hours.    Special instructions:    DO NOT smoke tobacco or vape for 24 hours before your procedure.  Please read over the following fact sheets that you were given. Anesthesia Post-op Instructions and Care and Recovery After Surgery      Colonoscopy, Adult, Care After This sheet gives you information about how to care for yourself after your procedure. Your health care provider may also give you more specific instructions. If you have problems or questions, contact your health careprovider. What can I expect after the procedure? After the procedure, it is common to have: A small amount of blood in your stool for 24 hours after the procedure. Some gas. Mild cramping or bloating of your abdomen. Follow these instructions  at home: Eating and drinking  Drink enough fluid to keep your urine pale yellow. Follow instructions from your health care provider about eating or drinking restrictions. Resume your normal diet as instructed by your health care provider. Avoid heavy or fried foods that are hard to digest.  Activity Rest as told by your health care provider. Avoid sitting for a long time without moving. Get up to take short walks every 1-2 hours. This is important to improve blood flow and breathing. Ask for help if you feel weak or unsteady. Return to your normal activities as told by your health care provider. Ask your health care provider what activities are safe for you. Managing cramping and bloating  Try walking around when you have cramps or feel bloated. Apply heat to your abdomen as told by your health care provider. Use the heat source that your health care provider recommends, such as a moist heat pack or a heating pad. Place a towel between your skin and the heat source. Leave the heat on for 20-30 minutes. Remove the heat if your skin turns bright red. This is especially important if you are unable to feel pain, heat, or cold. You may have a greater risk of getting burned.  General instructions If you were given a sedative during the procedure, it can affect you for several hours. Do not drive or operate machinery until your health care  provider says that it is safe. For the first 24 hours after the procedure: Do not sign important documents. Do not drink alcohol. Do your regular daily activities at a slower pace than normal. Eat soft foods that are easy to digest. Take over-the-counter and prescription medicines only as told by your health care provider. Keep all follow-up visits as told by your health care provider. This is important. Contact a health care provider if: You have blood in your stool 2-3 days after the procedure. Get help right away if you have: More than a small spotting  of blood in your stool. Large blood clots in your stool. Swelling of your abdomen. Nausea or vomiting. A fever. Increasing pain in your abdomen that is not relieved with medicine. Summary After the procedure, it is common to have a small amount of blood in your stool. You may also have mild cramping and bloating of your abdomen. If you were given a sedative during the procedure, it can affect you for several hours. Do not drive or operate machinery until your health care provider says that it is safe. Get help right away if you have a lot of blood in your stool, nausea or vomiting, a fever, or increased pain in your abdomen. This information is not intended to replace advice given to you by your health care provider. Make sure you discuss any questions you have with your healthcare provider. Document Revised: 05/20/2019 Document Reviewed: 12/20/2018 Elsevier Patient Education  [place] This sheet gives you information about how to care for yourself after your procedure. Your health care provider may also give you more specific instructions. If you have problems or questions, contact your health careprovider. What can I expect after the procedure? After the procedure, it is common to have: Tiredness. Forgetfulness about what happened after the procedure. Impaired judgment for important decisions. Nausea or vomiting. Some difficulty with balance. Follow these instructions at home: For the time period you were told by your health care provider:     Rest as needed. Do not participate in activities where you could fall or become injured. Do not drive or use machinery. Do not drink alcohol. Do not take sleeping pills or medicines that cause drowsiness. Do not make important decisions or sign legal documents. Do not take care of children on your own. Eating and drinking Follow the diet that is recommended by your health care  provider. Drink enough fluid to keep your urine pale yellow. If you vomit: Drink water, juice, or soup when you can drink without vomiting. Make sure you have little or no nausea before eating solid foods. General instructions Have a responsible adult stay with you for the time you are told. It is important to have someone help care for you until you are awake and alert. Take over-the-counter and prescription medicines only as told by your health care provider. If you have sleep apnea, surgery and certain medicines can increase your risk for breathing problems. Follow instructions from your health care provider about wearing your sleep device: Anytime you are sleeping, including during daytime naps. While taking prescription pain medicines, sleeping medicines, or medicines that make you drowsy. Avoid smoking. Keep all follow-up visits as told by your health care provider. This is important. Contact a health care provider if: You keep feeling nauseous or you keep vomiting. You feel light-headed. You are still sleepy or having trouble with balance after 24 hours. You develop a rash. You have a  fever. You have redness or swelling around the IV site. Get help right away if: You have trouble breathing. You have new-onset confusion at home. Summary For several hours after your procedure, you may feel tired. You may also be forgetful and have poor judgment. Have a responsible adult stay with you for the time you are told. It is important to have someone help care for you until you are awake and alert. Rest as told. Do not drive or operate machinery. Do not drink alcohol or take sleeping pills. Get help right away if you have trouble breathing, or if you suddenly become confused. This information is not intended to replace advice given to you by your health care provider. Make sure you discuss any questions you have with your healthcare provider. Document Revised: 02/09/2020 Document Reviewed:  04/28/2019 Elsevier Patient Education  2022 [employer].

## 2020-12-12 ENCOUNTER — Other Ambulatory Visit: Payer: Self-pay

## 2020-12-12 ENCOUNTER — Encounter (HOSPITAL_COMMUNITY)
Admission: RE | Admit: 2020-12-12 | Discharge: 2020-12-12 | Disposition: A | Discharge status: Home / Self Care | Payer: 59 | Source: Ambulatory Visit | Attending: Gastroenterology | Admitting: Gastroenterology

## 2020-12-12 ENCOUNTER — Encounter (HOSPITAL_COMMUNITY): Payer: Self-pay

## 2020-12-12 DIAGNOSIS — Z01818 Encounter for other preprocedural examination: Secondary | ICD-10-CM | POA: Diagnosis present

## 2020-12-12 DIAGNOSIS — Z01812 Encounter for preprocedural laboratory examination: Secondary | ICD-10-CM | POA: Diagnosis not present

## 2020-12-12 LAB — CBC WITH DIFFERENTIAL/PLATELET
Abs Immature Granulocytes: 0.03 10*3/uL (ref 0.00–0.07)
Basophils Absolute: 0 10*3/uL (ref 0.0–0.1)
Basophils Relative: 1 %
Eosinophils Absolute: 0.1 10*3/uL (ref 0.0–0.5)
Eosinophils Relative: 2 %
HCT: 35.2 % — ABNORMAL LOW (ref 39.0–52.0)
Hemoglobin: 11.4 g/dL — ABNORMAL LOW (ref 13.0–17.0)
Immature Granulocytes: 1 %
Lymphocytes Relative: 15 %
Lymphs Abs: 0.8 10*3/uL (ref 0.7–4.0)
MCH: 27.4 pg (ref 26.0–34.0)
MCHC: 32.4 g/dL (ref 30.0–36.0)
MCV: 84.6 fL (ref 80.0–100.0)
Monocytes Absolute: 0.3 10*3/uL (ref 0.1–1.0)
Monocytes Relative: 6 %
Neutro Abs: 4 10*3/uL (ref 1.7–7.7)
Neutrophils Relative %: 75 %
Platelets: 367 10*3/uL (ref 150–400)
RBC: 4.16 MIL/uL — ABNORMAL LOW (ref 4.22–5.81)
RDW: 15.8 % — ABNORMAL HIGH (ref 11.5–15.5)
WBC: 5.3 10*3/uL (ref 4.0–10.5)
nRBC: 0 % (ref 0.0–0.2)

## 2020-12-12 LAB — BASIC METABOLIC PANEL
Anion gap: 12 (ref 5–15)
BUN: 40 mg/dL — ABNORMAL HIGH (ref 8–23)
CO2: 27 mmol/L (ref 22–32)
Calcium: 11.4 mg/dL — ABNORMAL HIGH (ref 8.9–10.3)
Chloride: 96 mmol/L — ABNORMAL LOW (ref 98–111)
Creatinine, Ser: 1.04 mg/dL (ref 0.61–1.24)
GFR, Estimated: >60 mL/min (ref >60)
Glucose, Bld: 249 mg/dL — ABNORMAL HIGH (ref 70–99)
Potassium: 4.4 mmol/L (ref 3.5–5.1)
Sodium: 135 mmol/L (ref 135–145)

## 2020-12-13 ENCOUNTER — Encounter: Payer: Self-pay | Admitting: Internal Medicine

## 2020-12-13 ENCOUNTER — Ambulatory Visit (INDEPENDENT_AMBULATORY_CARE_PROVIDER_SITE_OTHER): Payer: Medicare Other | Admitting: Internal Medicine

## 2020-12-13 VITALS — BP 148/68 | HR 70 | Ht 69.0 in | Wt 141.0 lb

## 2020-12-13 DIAGNOSIS — Z01810 Encounter for preprocedural cardiovascular examination: Secondary | ICD-10-CM | POA: Diagnosis not present

## 2020-12-13 NOTE — Patient Instructions (Signed)
Medication Instructions:  Your physician recommends that you continue on your current medications as directed. Please refer to the Current Medication list given to you today.  *If you need a refill on your cardiac medications before your next appointment, please call your pharmacy*   Lab Work: NONE   If you have labs (blood work) drawn today and your tests are completely normal, you will receive your results only by: [place] (if you have MyChart) OR A paper copy in the mail If you have any lab test that is abnormal or we need to change your treatment, we will call you to review the results.   Testing/Procedures: NONE    Follow-Up: At [facility], you and your health needs are our priority.  As part of our continuing mission to provide you with exceptional heart care, we have created designated Provider Care Teams.  These Care Teams include your primary Cardiologist (physician) and Advanced Practice Providers (APPs -  Physician Assistants and Nurse Practitioners) who all work together to provide you with the care you need, when you need it.  We recommend signing up for the patient portal called "MyChart".  Sign up information is provided on this After Visit Summary.  MyChart is used to connect with patients for Virtual Visits (Telemedicine).  Patients are able to view lab/test results, encounter notes, upcoming appointments, etc.  Non-urgent messages can be sent to your provider as well.   To learn more about what you can do with MyChart, go to [email or web address].    Your next appointment:    TBD   The format for your next appointment:   In Person  Provider:   Dorris Carnes, MD   Other Instructions Thank you for choosing [facility]!

## 2020-12-13 NOTE — Progress Notes (Signed)
Cardiology Office Note   Date:  12/13/2020   ID:  Cowen, Pesqueira 02-26-1951, MRN [id number]  PCP:  Leonie Douglas, MD  Cardiologist:   Dorris Carnes, MD   Pt presents for preop evaulation    History of Present Illness: Daniel [name] is a 70 y.o. 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   aMILoride (MIDAMOR) 5 MG tablet Take 1 tablet (5 mg total) by mouth daily. (Patient taking differently: Take 5 mg by mouth in the morning. (1000))   amitriptyline (ELAVIL) 10 MG tablet Take 10 mg by mouth at bedtime. (2100)   apixaban (ELIQUIS) 5 MG TABS tablet Take 1 tablet (5 mg total) by mouth 2 (two) times daily. Start around May 12th 2022  after you finish the initial Eliquis starter pack (Patient taking differently: Take 5 mg by mouth 2 (two) times daily. Start around May 12th 2022  after you finish the initial Eliquis starter pack)   atorvastatin (LIPITOR) 40 MG tablet Take 1 tablet (40 mg total) by mouth daily. (Patient taking differently: Take 40 mg by mouth daily at 6 PM.)   Cholecalciferol (VITAMIN D3) 50 MCG (2000 UT) TABS Take 2,000 Units by mouth in the morning. (0900)   donepezil (ARICEPT) 10 MG tablet TAKE (1) TABLET BY MOUTH ONCE DAILY. (Patient taking differently: Take 10 mg by mouth daily. (2100))   dronabinol (MARINOL) 2.5 MG capsule Take 2.5 mg by mouth in the morning and at bedtime. (0800 & 1700)   feeding  supplement (ENSURE ENLIVE / ENSURE PLUS) LIQD Take 237 mLs by mouth 2 (two) times daily between meals.   folic acid (FOLVITE) 973 MCG tablet Take 800 mcg by mouth every morning. (0800)   insulin aspart (NOVOLOG) 100 UNIT/ML FlexPen Inject 0-10 Units into the skin 3 (three) times daily with meals. insulin aspart (novoLOG) injection 0-10 Units 0-10 Units Subcutaneous, 3 times daily with meals CBG < 70: Implement Hypoglycemia Standing Orders and refer to Hypoglycemia Standing Orders sidebar report  CBG 70 - 120: 0 unit CBG 121 - 150: 0 unit  CBG 151 - 200: 1 unit CBG 201 - 250: 2 units CBG 251 - 300: 4 units CBG 301 - 350: 6 units  CBG 351 - 400: 8 units  CBG > 400: 10 units (Patient taking differently: Inject 0-10 Units into the skin 3 (three) times daily with meals. Inject as per sliding scale: 151-200= 1 unit, 201-250= 2 units, 251-300= 4 units, 301-350= 6 units, 351-400= 8 units, 401+= 10 units)   Lactulose 20 GM/30ML SOLN Take 30 mLs (20 g total) by mouth See admin instructions. --Please give lactulose 20 g p.o. x1 if no bowel movement in 48 hours (Patient taking differently: Take 20 g by mouth See admin instructions.  Give 20 g by mouth every 48 hours as needed for bowel management related to partial intestinal obstruction)   magnesium oxide (MAG-OX) 400 MG tablet Take 1 tablet (400 mg total) by mouth 2 (two) times daily. (Patient taking differently: Take 400 mg by mouth in the morning, at noon, and at bedtime. (1000, 1400 & 2000))   memantine (NAMENDA) 10 MG tablet Take 10 mg by mouth 2 (two) times daily. (1000 & 1700)   metFORMIN (GLUCOPHAGE) 1000 MG tablet Take 1 tablet (1,000 mg total) by mouth 2 (two) times daily with a meal. (Patient taking differently: Take 1,000 mg by mouth in the morning and at bedtime. (1000 & 1700))   metoCLOPramide (REGLAN) 5 MG tablet Take 1 tablet (5 mg total) by mouth 3 (three) times daily. For stomach (Patient taking differently: Take 5 mg by mouth 3 (three) times daily.  (1000, 1400 & 2000) For stomach)   metoprolol tartrate (LOPRESSOR) 25 MG tablet Take 1 tablet (25 mg total) by mouth 2 (two) times daily. (Patient taking differently: Take 25 mg by mouth 2 (two) times daily. (1000 & 1700))   mirtazapine (REMERON) 15 MG tablet Take 15 mg by mouth at bedtime. (2100)   ondansetron (ZOFRAN) 4 MG tablet Take 1 tablet (4 mg total) by mouth every 6 (six) hours as needed for nausea.   oxybutynin (DITROPAN-XL) 5 MG 24 hr tablet Take 5 mg by mouth daily. (1000)   OXYGEN Inhale 2 L into the lungs continuous.   pantoprazole (PROTONIX) 40 MG tablet Take 1 tablet (40 mg total) by mouth 2 (two) times daily before a meal.   polyethylene glycol (MIRALAX) 17 g packet Take 17 g by mouth 2 (two) times daily. For Constipation (Patient taking differently: Take 17 g by mouth 2 (two) times daily. (0800 & 2000) For Constipation)   polyethylene glycol-electrolytes (TRILYTE) 420 g solution Take 4,000 mLs by mouth as directed.   pyridostigmine (MESTINON) 60 MG tablet Take 1 tablet (60 mg total) by mouth 3 (three) times daily. (Patient taking differently: Take 60 mg by mouth 3 (three) times daily. (1000, 1400 & 2100))   senna (SENOKOT) 8.6 MG TABS tablet Take 2 tablets by mouth at bedtime. (2000)   senna-docusate (SENOKOT-S) 8.6-50 MG tablet Take 2 tablets by mouth at bedtime.   sucralfate (CARAFATE) 1 g tablet Take 1 tablet (1 g total) by mouth 4 (four) times daily. (Patient taking differently: Take 1 g by mouth 4 (four) times daily. (0900, 1300, 1700 & 2100))   tamsulosin (FLOMAX) 0.4 MG CAPS capsule TAKE 1 CAPSULE BY MOUTH AT BEDTIME. (Patient taking differently: Take 0.4 mg by mouth at bedtime. (2100))   traZODone (DESYREL) 100 MG tablet Take 100 mg by mouth at bedtime. (2100)   vitamin B-12 (CYANOCOBALAMIN) 500 MCG tablet Take 1,000 mcg by mouth daily. (0900)     Allergies:   Patient has no known allergies.   Past Medical History:  Diagnosis Date   Acute kidney failure ([place])     Acute respiratory failure ([facility])    Adenomatous polyp of stomach    Anemia    Benign prostate hyperplasia    Dementia ([place])    Depression    Diabetes mellitus without complication (H[facility])    DVT (deep venous thrombosis) ([facility])    Gastric ulcer    GERD (gastroesophageal reflux disease)    High cholesterol    Hypertension    Hypo-osmolality and hyponatremia    Insomnia    Neuromuscular dysfunction of bladder  Osteoarthritis    Partial intestinal obstruction (H[facility])    Pneumonitis    Protein-calorie malnutrition, moderate ([place])    Stroke [facility])    Supplemental oxygen dependent    24/7   Wheelchair bound     Past Surgical History:  Procedure Laterality Date   BIOPSY  06/21/2020   Procedure: BIOPSY;  Surgeon: Daneil Dolin, MD;  [facility];  Service: Endoscopy;;  gastric   BIOPSY  09/18/2020   Procedure: BIOPSY;  Surgeon: Harvel Quale, MD;  [facility];  Service: Gastroenterology;;   ENDOSCOPIC MUCOSAL RESECTION N/A 11/15/2020   Procedure: ENDOSCOPIC MUCOSAL RESECTION;  Surgeon: Irving Copas., MD;  Location[facility] END[facility]COPY;  Service: Gastroenterology;  Laterality: N/A;   ESOPHAGOGASTRODUODENOSCOPY (EGD) WITH PROPOFOL N/A 06/21/2020   Procedure: ESOPHAGOGASTRODUODENOSCOPY (EGD) WITH PROPOFOL;  Surgeon: Daneil Dolin, MD;  [facility];  Service: Endoscopy;  Laterality: N/A;   ESOPHAGOGASTRODUODENOSCOPY (EGD) WITH PROPOFOL N/A 09/18/2020   Procedure: ESOPHAGOGASTRODUODENOSCOPY (EGD) WITH PROPOFOL;  Surgeon: Harvel Quale, MD;  [facility];  Service: Gastroenterology;  Laterality: N/A;   ESOPHAGOGASTRODUODENOSCOPY (EGD) WITH PROPOFOL N/A 11/15/2020   Procedure: ESOPHAGOGASTRODUODENOSCOPY (EGD) WITH PROPOFOL;  Surgeon: Rush Landmark Telford Nab., MD;  Location: [place];  Service: Gastroenterology;  Laterality: N/A;   FLEXIBLE SIGMOIDOSCOPY N/A 11/15/2020   Procedure: FLEXIBLE SIGMOIDOSCOPY;  Surgeon: Rush Landmark  Telford Nab., MD;  Location: [place];  Service: Gastroenterology;  Laterality: N/A;   HEMOSTASIS CLIP PLACEMENT  11/15/2020   Procedure: HEMOSTASIS CLIP PLACEMENT;  Surgeon: Irving Copas., MD;  Location: [place];  Service: Gastroenterology;;   HIP SURGERY Left    KIDNEY SURGERY     kidney removed   POLYPECTOMY  11/15/2020   Procedure: POLYPECTOMY;  Surgeon: Mansouraty, Telford Nab., MD;  Location: [place];  Service: Gastroenterology;;   [place] INJECTION  11/15/2020   Procedure: SUBMUCOSAL LIFTING INJECTION;  Surgeon: Irving Copas., MD;  Location: [place];  Service: Gastroenterology;;   SUBMUCOSAL TATTOO INJECTION  11/15/2020   Procedure: SUBMUCOSAL TATTOO INJECTION;  Surgeon: Irving Copas., MD;  Location: [place];  Service: Gastroenterology;;     Social History:  The patient  reports that he has never smoked. He has never used smokeless tobacco. He reports that he does not drink alcohol and does not use drugs.   Family History:  The patient's family history includes Breast cancer in his niece; Hyperlipidemia in his sister; Hypertension in his sister; Osteoarthritis in his sister; Rheum arthritis in his sister.    ROS:  Please see the history of present illness. All other systems are reviewed and  Negative to the above problem except as noted.    PHYSICAL EXAM: VS:  BP (!) 148/68   Pulse 70   Ht 5\' 9"  (1.753 m)   Wt 141 lb (64 kg)   SpO2 96%   BMI 20.82 kg/m   GEN: Thin 70 yo  in no acute distress  HEENT: Atraumatic   R eye cloudy Neck: no JVD, carotid bruits, Cardiac: RRR; no murmurs   No LE edema  Respiratory:  clear to auscultation bilaterally, normal work of breathing GI: soft, nontender, nondistended, + BS  No hepatomegaly  MS: no deformity Moving all extremities   Skin: warm and dry, no rash Neuro:  Moving all extremities otherwise deferred   Psych:   Pt comfortable   Quiet  Nods approp to questions adked      EKG:  EKG is ordered today.  12/12/20  SR   LVH  Poor R wave progression   Echo:    1. Abnormal septal motion . Left ventricular ejection fraction, by estimation, is 50 to 55%. The left ventricle has low normal function. The left ventricle has no regional wall motion abnormalities. There is mild left ventricular hypertrophy. Left ventricular diastolic parameters are consistent with Grade I diastolic dysfunction (impaired relaxation). 2. Right ventricular systolic function is normal. The right ventricular size is normal. 3. A small pericardial effusion is present. The pericardial effusion is posterior to the left ventricle. 4. The mitral valve is abnormal. Trivial mitral valve regurgitation. No evidence of mitral stenosis. 5. The aortic valve is tricuspid. Aortic valve regurgitation is not visualized. No aortic stenosis is present. 6. The inferior vena cava is normal in size with greater than 50% respiratory variability, suggesting right atrial pressure of 3 mmHg.   Lipid Panel    Component Value Date/Time   CHOL 140 01/06/2020 0449   TRIG 78 01/06/2020 0449   HDL 41 01/06/2020 0449   CHOLHDL 3.4 01/06/2020 0449   VLDL 16 01/06/2020 0449   LDLCALC 83 01/06/2020 0449      Wt Readings from Last 3 Encounters:  12/13/20 141 lb (64 kg)  12/12/20 146 lb (66.2 kg)  11/15/20 146 lb (66.2 kg)      ASSESSMENT AND PLAN:  1  Preop risk assessment  Pt is at low risk for major cardiac event with planned colonosopy   Echo shows normal LV function   Pt in SR  Exam:  Euvolemic   OK to proceed from cardiac standpont    Further procedures may be indicated   Risk assessment may changed based on what is found/what is needed       2  Hx CVA   July 2774   Felt embolic     Pt wore monitor   No atrial fibrillation demonstrated   Still concerning that he may have PAF    With Hx of DVT on right (unprovoked other than wheelchair bound), would consider long term anticoagulation .    If comes off of  Eliqus could consider LINQ implant to follow long term for afib  3   Hx DVT   RLE DVT   I do not see an discrete event to provoke other than wheelchair bound  If OK  from GI / anemia standpoint would  probably recomm long term Eliquis  4  HL   Last lipids in 2021 were not bad  LDL 83  HDL 41   With DM and event should be on statin   Will defer to PCP  Goal LDL less than 70  3  Hx HTN  BP is fair   No changes     Will be available as needed   Current medicines are reviewed at length with the patient today.  The patient does not have concerns regarding medicines.  Signed, Dorris Carnes, MD  12/13/2020 10:55 AM    [address], [place], [place]  [address] Phone: [phone]; Fax: [phone]

## 2020-12-14 ENCOUNTER — Encounter (HOSPITAL_COMMUNITY)
Admission: RE | Disposition: A | Discharge status: Skilled Nursing Facility | Payer: Self-pay | Source: Home / Self Care | Attending: Gastroenterology

## 2020-12-14 ENCOUNTER — Ambulatory Visit (HOSPITAL_COMMUNITY): Payer: Medicare Other | Admitting: Anesthesiology

## 2020-12-14 ENCOUNTER — Ambulatory Visit (HOSPITAL_COMMUNITY)
Admission: RE | Admit: 2020-12-14 | Discharge: 2020-12-14 | Disposition: A | Discharge status: Skilled Nursing Facility | Payer: Medicare Other | Attending: Gastroenterology | Admitting: Gastroenterology

## 2020-12-14 DIAGNOSIS — Z8673 Personal history of transient ischemic attack (TIA), and cerebral infarction without residual deficits: Secondary | ICD-10-CM | POA: Diagnosis not present

## 2020-12-14 DIAGNOSIS — E785 Hyperlipidemia, unspecified: Secondary | ICD-10-CM | POA: Insufficient documentation

## 2020-12-14 DIAGNOSIS — Z7901 Long term (current) use of anticoagulants: Secondary | ICD-10-CM | POA: Diagnosis not present

## 2020-12-14 DIAGNOSIS — E440 Moderate protein-calorie malnutrition: Secondary | ICD-10-CM | POA: Insufficient documentation

## 2020-12-14 DIAGNOSIS — I10 Essential (primary) hypertension: Secondary | ICD-10-CM | POA: Diagnosis not present

## 2020-12-14 DIAGNOSIS — Z794 Long term (current) use of insulin: Secondary | ICD-10-CM | POA: Diagnosis not present

## 2020-12-14 DIAGNOSIS — Z79899 Other long term (current) drug therapy: Secondary | ICD-10-CM | POA: Diagnosis not present

## 2020-12-14 DIAGNOSIS — D509 Iron deficiency anemia, unspecified: Secondary | ICD-10-CM | POA: Diagnosis present

## 2020-12-14 DIAGNOSIS — E119 Type 2 diabetes mellitus without complications: Secondary | ICD-10-CM | POA: Diagnosis not present

## 2020-12-14 DIAGNOSIS — Z7984 Long term (current) use of oral hypoglycemic drugs: Secondary | ICD-10-CM | POA: Insufficient documentation

## 2020-12-14 DIAGNOSIS — Z9119 Patient's noncompliance with other medical treatment and regimen: Secondary | ICD-10-CM | POA: Diagnosis not present

## 2020-12-14 DIAGNOSIS — Z6820 Body mass index (BMI) 20.0-20.9, adult: Secondary | ICD-10-CM | POA: Diagnosis not present

## 2020-12-14 DIAGNOSIS — R4189 Other symptoms and signs involving cognitive functions and awareness: Secondary | ICD-10-CM | POA: Insufficient documentation

## 2020-12-14 HISTORY — PX: COLONOSCOPY WITH PROPOFOL: SHX5780

## 2020-12-14 LAB — GLUCOSE, CAPILLARY: Glucose-Capillary: 194 mg/dL — ABNORMAL HIGH (ref 70–99)

## 2020-12-14 SURGERY — COLONOSCOPY WITH PROPOFOL
Anesthesia: General

## 2020-12-14 MED ORDER — METOPROLOL TARTRATE 5 MG/5ML IV SOLN
INTRAVENOUS | Status: AC
  Filled 2020-12-14: qty 5

## 2020-12-14 MED ORDER — METOCLOPRAMIDE HCL 5 MG/ML IJ SOLN
10.0000 mg | Freq: Once | INTRAMUSCULAR | Status: AC
  Administered 2020-12-14: 10 mg via INTRAVENOUS

## 2020-12-14 MED ORDER — LACTATED RINGERS IV SOLN: INTRAVENOUS | Status: DC

## 2020-12-14 MED ORDER — PROPOFOL 500 MG/50ML IV EMUL
INTRAVENOUS | Status: DC | PRN
  Administered 2020-12-14: 100 ug/kg/min via INTRAVENOUS

## 2020-12-14 MED ORDER — METOPROLOL TARTRATE 5 MG/5ML IV SOLN
2.5000 mg | INTRAVENOUS | Status: DC | PRN
  Administered 2020-12-14: 2.5 mg via INTRAVENOUS

## 2020-12-14 MED ORDER — ONDANSETRON HCL 4 MG/2ML IJ SOLN
INTRAMUSCULAR | Status: AC
  Filled 2020-12-14: qty 2

## 2020-12-14 MED ORDER — LIDOCAINE HCL (CARDIAC) PF 100 MG/5ML IV SOSY
PREFILLED_SYRINGE | INTRAVENOUS | Status: DC | PRN
  Administered 2020-12-14: 40 mg via INTRAVENOUS

## 2020-12-14 MED ORDER — PROPOFOL 10 MG/ML IV BOLUS
INTRAVENOUS | Status: DC | PRN
  Administered 2020-12-14: 20 mg via INTRAVENOUS
  Administered 2020-12-14: 50 mg via INTRAVENOUS

## 2020-12-14 MED ORDER — METOCLOPRAMIDE HCL 5 MG/ML IJ SOLN
INTRAMUSCULAR | Status: AC
  Filled 2020-12-14: qty 2

## 2020-12-14 MED ORDER — ONDANSETRON HCL 4 MG/2ML IJ SOLN
4.0000 mg | Freq: Once | INTRAMUSCULAR | Status: AC
  Administered 2020-12-14: 4 mg via INTRAVENOUS

## 2020-12-14 MED ORDER — STERILE WATER FOR IRRIGATION IR SOLN
Status: DC | PRN
  Administered 2020-12-14: 1.5 mL

## 2020-12-14 NOTE — H&P (Signed)
Daniel [name] is an 70 y.o. male.   Chief Complaint: Iron deficiency anemia HPI: Daniel [name] is a 69 y.o. male with past medical history of diabetes, hyperlipidemia, hypertension, history of GI bleed secondary to severe esophagitis, recurrent gastric issues, cognitive impairment,gastric ileu and CVA, who presents for evaluation of IDA.  The patient comes with legal guardian.  Legal guardian states that he has not present any melena, hematochezia, frequent nausea, vomiting, fever, chills, abdominal pain or distention.  He has been on oral iron supplementation and has a scheduled order for IV iron.  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  Had poor prep in his most recent colonoscopy.  Past Medical History:  Diagnosis Date   Acute kidney failure (HCC)    Acute respiratory failure (HCC)    Adenomatous polyp of stomach    Anemia    Benign prostate hyperplasia    Dementia (HCC)    Depression    Diabetes mellitus without complication (HCC)    DVT (deep venous thrombosis) (HCC)    Gastric ulcer    GERD (gastroesophageal reflux disease)    High cholesterol    Hypertension    Hypo-osmolality and hyponatremia    Insomnia    Neuromuscular dysfunction of bladder    Osteoarthritis    Partial intestinal obstruction (HCC)    Pneumonitis    Protein-calorie malnutrition, moderate ([place])    Stroke ([place])    Supplemental oxygen dependent    24/7   Wheelchair bound     Past Surgical History:  Procedure Laterality Date   BIOPSY  06/21/2020   Procedure: BIOPSY;  Surgeon: Daneil Dolin, MD;  [facility];  Service: Endoscopy;;  gastric   BIOPSY  09/18/2020   Procedure: BIOPSY;  Surgeon: Harvel Quale, MD;  [facility];  Service: Gastroenterology;;   ENDOSCOPIC MUCOSAL RESECTION N/A 11/15/2020   Procedure: ENDOSCOPIC MUCOSAL RESECTION;  Surgeon: Irving Copas., MD;  Location: [facility];  Service: Gastroenterology;  Laterality: N/A;    ESOPHAGOGASTRODUODENOSCOPY (EGD) WITH PROPOFOL N/A 06/21/2020   Procedure: ESOPHAGOGASTRODUODENOSCOPY (EGD) WITH PROPOFOL;  Surgeon: Daneil Dolin, MD;  [facility];  Service: Endoscopy;  Laterality: N/A;   ESOPHAGOGASTRODUODENOSCOPY (EGD) WITH PROPOFOL N/A 09/18/2020   Procedure: ESOPHAGOGASTRODUODENOSCOPY (EGD) WITH PROPOFOL;  Surgeon: Harvel Quale, MD;  [facility];  Service: Gastroenterology;  Laterality: N/A;   ESOPHAGOGASTRODUODENOSCOPY (EGD) WITH PROPOFOL N/A 11/15/2020   Procedure: ESOPHAGOGASTRODUODENOSCOPY (EGD) WITH PROPOFOL;  Surgeon: Rush Landmark Telford Nab., MD;  Location: [place];  Service: Gastroenterology;  Laterality: N/A;   FLEXIBLE SIGMOIDOSCOPY N/A 11/15/2020   Procedure: FLEXIBLE SIGMOIDOSCOPY;  Surgeon: Rush Landmark Telford Nab., MD;  Location: [place];  Service: Gastroenterology;  Laterality: N/A;   HEMOSTASIS CLIP PLACEMENT  11/15/2020   Procedure: HEMOSTASIS CLIP PLACEMENT;  Surgeon: Irving Copas., MD;  Location: [place];  Service: Gastroenterology;;   HIP SURGERY Left    KIDNEY SURGERY     kidney removed   POLYPECTOMY  11/15/2020   Procedure: POLYPECTOMY;  Surgeon: Mansouraty, Telford Nab., MD;  Location: [place];  Service: Gastroenterology;;   SUBMUCOSAL LIFTING INJECTION  11/15/2020   Procedure: SUBMUCOSAL LIFTING INJECTION;  Surgeon: Irving Copas., MD;  Location: [place];  Service: Gastroenterology;;   SUBMUCOSAL TATTOO INJECTION  11/15/2020   Procedure: SUBMUCOSAL TATTOO INJECTION;  Surgeon: Irving Copas., MD;  Location: [facility] ENDOSCOPY;  Service: Gastroenterology;;    Family History  Problem Relation Age of Onset   Hypertension  Sister    Hyperlipidemia Sister    Osteoarthritis Sister    Rheum arthritis Sister    Breast cancer Niece    Colon cancer Neg Hx    Esophageal cancer Neg Hx    Inflammatory bowel disease Neg Hx    Liver disease Neg Hx    Pancreatic cancer Neg Hx    Stomach  cancer Neg Hx    Rectal cancer Neg Hx    Social History:  reports that he has never smoked. He has never used smokeless tobacco. He reports that he does not drink alcohol and does not use drugs.  Allergies: No Known Allergies  Medications Prior to Admission  Medication Sig Dispense Refill   acetaminophen (TYLENOL) 325 MG tablet Take 2 tablets (650 mg total) by mouth every 6 (six) hours as needed for mild pain, fever or headache (or Fever >/= 101). 12 tablet 0   aMILoride (MIDAMOR) 5 MG tablet Take 1 tablet (5 mg total) by mouth daily. (Patient taking differently: Take 5 mg by mouth in the morning. (1000)) 30 tablet 5   amitriptyline (ELAVIL) 10 MG tablet Take 10 mg by mouth at bedtime. (2100)     apixaban (ELIQUIS) 5 MG TABS tablet Take 1 tablet (5 mg total) by mouth 2 (two) times daily. Start around May 12th 2022  after you finish the initial Eliquis starter pack (Patient taking differently: Take 5 mg by mouth 2 (two) times daily. Start around May 12th 2022  after you finish the initial Eliquis starter pack) 60 tablet 2   atorvastatin (LIPITOR) 40 MG tablet Take 1 tablet (40 mg total) by mouth daily. (Patient taking differently: Take 40 mg by mouth daily at 6 PM.) 30 tablet 4   Cholecalciferol (VITAMIN D3) 50 MCG (2000 UT) TABS Take 2,000 Units by mouth in the morning. (0900)     donepezil (ARICEPT) 10 MG tablet TAKE (1) TABLET BY MOUTH ONCE DAILY. (Patient taking differently: Take 10 mg by mouth daily. (2100)) 30 tablet PRN   dronabinol (MARINOL) 2.5 MG capsule Take 2.5 mg by mouth in the morning and at bedtime. (0800 & 1700)     FEROSUL 325 (65 Fe) MG tablet TAKE 1 TABLET BY MOUTH TWICE DAILY. (Patient not taking: Reported on 12/13/2020) 976 tablet 3   folic acid (FOLVITE) 734 MCG tablet Take 800 mcg by mouth every morning. (0800)     insulin aspart (NOVOLOG) 100 UNIT/ML FlexPen Inject 0-10 Units into the skin 3 (three) times daily with meals. insulin aspart (novoLOG) injection 0-10 Units 0-10  Units Subcutaneous, 3 times daily with meals CBG < 70: Implement Hypoglycemia Standing Orders and refer to Hypoglycemia Standing Orders sidebar report  CBG 70 - 120: 0 unit CBG 121 - 150: 0 unit  CBG 151 - 200: 1 unit CBG 201 - 250: 2 units CBG 251 - 300: 4 units CBG 301 - 350: 6 units  CBG 351 - 400: 8 units  CBG > 400: 10 units (Patient taking differently: Inject 0-10 Units into the skin 3 (three) times daily with meals. Inject as per sliding scale: 151-200= 1 unit, 201-250= 2 units, 251-300= 4 units, 301-350= 6 units, 351-400= 8 units, 401+= 10 units) 15 mL 11   Lactulose 20 GM/30ML SOLN Take 30 mLs (20 g total) by mouth See admin instructions. --Please give lactulose 20 g p.o. x1 if no bowel movement in 48 hours (Patient taking differently: Take 20 g by mouth See admin instructions. Give 20 g by mouth every 48  hours as needed for bowel management related to partial intestinal obstruction) 236 mL 0   magnesium oxide (MAG-OX) 400 MG tablet Take 1 tablet (400 mg total) by mouth 2 (two) times daily. (Patient taking differently: Take 400 mg by mouth in the morning, at noon, and at bedtime. (1000, 1400 & 2000)) 180 tablet 5   memantine (NAMENDA) 10 MG tablet Take 10 mg by mouth 2 (two) times daily. (1000 & 1700)     metFORMIN (GLUCOPHAGE) 1000 MG tablet Take 1 tablet (1,000 mg total) by mouth 2 (two) times daily with a meal. (Patient taking differently: Take 1,000 mg by mouth in the morning and at bedtime. (1000 & 1700)) 60 tablet 3   metoCLOPramide (REGLAN) 5 MG tablet Take 1 tablet (5 mg total) by mouth 3 (three) times daily. For stomach (Patient taking differently: Take 5 mg by mouth 3 (three) times daily. (1000, 1400 & 2000) For stomach) 90 tablet 1   metoprolol tartrate (LOPRESSOR) 25 MG tablet Take 1 tablet (25 mg total) by mouth 2 (two) times daily. (Patient taking differently: Take 25 mg by mouth 2 (two) times daily. (1000 & 1700)) 60 tablet 3   mirtazapine (REMERON) 15 MG tablet Take 15 mg by mouth at  bedtime. (2100)     ondansetron (ZOFRAN) 4 MG tablet Take 1 tablet (4 mg total) by mouth every 6 (six) hours as needed for nausea. 20 tablet 0   oxybutynin (DITROPAN-XL) 5 MG 24 hr tablet Take 5 mg by mouth daily. (1000)     OXYGEN Inhale 2 L into the lungs continuous.     pantoprazole (PROTONIX) 40 MG tablet Take 1 tablet (40 mg total) by mouth 2 (two) times daily before a meal. 180 tablet 3   polyethylene glycol (MIRALAX) 17 g packet Take 17 g by mouth 2 (two) times daily. For Constipation (Patient taking differently: Take 17 g by mouth 2 (two) times daily. (0800 & 2000) For Constipation) 60 each 2   pyridostigmine (MESTINON) 60 MG tablet Take 1 tablet (60 mg total) by mouth 3 (three) times daily. (Patient taking differently: Take 60 mg by mouth 3 (three) times daily. (1000, 1400 & 2100)) 270 tablet 3   senna (SENOKOT) 8.6 MG TABS tablet Take 2 tablets by mouth at bedtime. (2000)     sucralfate (CARAFATE) 1 g tablet Take 1 tablet (1 g total) by mouth 4 (four) times daily. (Patient taking differently: Take 1 g by mouth 4 (four) times daily. (0900, 1300, 1700 & 2100)) 120 tablet 1   tamsulosin (FLOMAX) 0.4 MG CAPS capsule TAKE 1 CAPSULE BY MOUTH AT BEDTIME. (Patient taking differently: Take 0.4 mg by mouth at bedtime. (2100)) 90 capsule 1   traZODone (DESYREL) 100 MG tablet Take 100 mg by mouth at bedtime. (2100)     vitamin B-12 (CYANOCOBALAMIN) 500 MCG tablet Take 1,000 mcg by mouth daily. (0900)     feeding supplement (ENSURE ENLIVE / ENSURE PLUS) LIQD Take 237 mLs by mouth 2 (two) times daily between meals. 237 mL 12   polyethylene glycol-electrolytes (TRILYTE) 420 g solution Take 4,000 mLs by mouth as directed. 4000 mL 0   senna-docusate (SENOKOT-S) 8.6-50 MG tablet Take 2 tablets by mouth at bedtime. 60 tablet 3    Results for orders placed or performed during the hospital encounter of 12/14/20 (from the past 48 hour(s))  Glucose, capillary     Status: Abnormal   Collection Time: 12/14/20   8:02 AM  Result Value Ref Range  Glucose-Capillary 194 (H) 70 - 99 mg/dL    Comment: Glucose reference range applies only to samples taken after fasting for at least 8 hours.   No results found.  Review of Systems  Constitutional: Negative.   HENT: Negative.    Eyes: Negative.   Respiratory: Negative.    Cardiovascular: Negative.   Gastrointestinal: Negative.   Endocrine: Negative.   Genitourinary: Negative.   Musculoskeletal: Negative.   Skin: Negative.   Allergic/Immunologic: Negative.   Neurological: Negative.   Hematological: Negative.   Psychiatric/Behavioral: Negative.     Blood pressure (!) 156/88, pulse 99, temperature (!) 97.5 F (36.4 C), temperature source Oral, resp. rate 15, SpO2 99 %. Physical Exam  GENERAL: The patient is AO x3, in no acute distress.  Uses supplemental oxygen. HEENT: Head is normocephalic and atraumatic. EOMI are intact. Mouth is well hydrated and without lesions. NECK: Supple. No masses LUNGS: Clear to auscultation. No presence of rhonchi/wheezing/rales. Adequate chest expansion HEART: RRR, normal s1 and s2. ABDOMEN: Soft, nontender, no guarding, no peritoneal signs, and nondistended. BS +. No masses. EXTREMITIES: Without any cyanosis, clubbing, rash, lesions or edema. NEUROLOGIC: AOx3, no focal motor deficit. SKIN: no jaundice, no rashes  Assessment/Plan Daniel [name] is a 70 y.o. male with past medical history of diabetes, hyperlipidemia, hypertension, history of GI bleed secondary to severe esophagitis, recurrent gastric issues, cognitive impairment,gastric ileu and CVA, who presents for evaluation of IDA.  We will proceed with colonoscopy.  Harvel Quale, MD 12/14/2020, 8:34 AM

## 2020-12-14 NOTE — Anesthesia Preprocedure Evaluation (Signed)
Anesthesia Evaluation  Patient identified by MRN, date of birth, ID band Patient awake and Patient confused    Reviewed: Allergy & Precautions, NPO status , Patient's Chart, lab work & pertinent test results, reviewed documented beta blocker date and time   History of Anesthesia Complications Negative for: history of anesthetic complications  Airway Mallampati: III  TM Distance: >3 FB Neck ROM: Full    Dental  (+) Dental Advisory Given   Pulmonary shortness of breath and Long-Term Oxygen Therapy, pneumonia,  oxygen dependent,    Pulmonary exam normal breath sounds clear to auscultation       Cardiovascular Exercise Tolerance: Poor hypertension, Pt. on medications and Pt. on home beta blockers + DVT  Normal cardiovascular exam Rhythm:Regular Rate:Normal  19-Jun-2020 21:21:55 [facility] ROUTINE RECORD Sinus tachycardia Rightward axis Cannot rule out Anterior infarct , age undetermined Abnormal ECG No significant change since last tracing Confirmed by Gareth Morgan [phone]) on 06/21/2020 9:12:53 AM   Neuro/Psych PSYCHIATRIC DISORDERS Depression Dementia CVA, Residual Symptoms    GI/Hepatic Neg liver ROS, PUD, GERD  Medicated and Controlled,  Endo/Other  diabetes, Well Controlled, Type 2, Oral Hypoglycemic Agents  Renal/GU Renal InsufficiencyRenal disease (AKI)     Musculoskeletal  (+) Arthritis ,   Abdominal   Peds  Hematology  (+) anemia ,   Anesthesia Other Findings   Reproductive/Obstetrics                             Anesthesia Physical  Anesthesia Plan  ASA: 4  Anesthesia Plan: General   Post-op Pain Management:    Induction: Intravenous  PONV Risk Score and Plan:   Airway Management Planned:   Additional Equipment:   Intra-op Plan:   Post-operative Plan:   Informed Consent: I have reviewed the patients History and Physical, chart, labs and  discussed the procedure including the risks, benefits and alternatives for the proposed anesthesia with the patient or authorized representative who has indicated his/her understanding and acceptance.     Dental advisory given  Plan Discussed with: CRNA and Surgeon  Anesthesia Plan Comments:         Anesthesia Quick Evaluation

## 2020-12-14 NOTE — Transfer of Care (Signed)
Immediate Anesthesia Transfer of Care Note  Patient: Daniel [name]  Procedure(s) Performed: COLONOSCOPY WITH PROPOFOL  Patient Location: Short Stay  Anesthesia Type:General  Level of Consciousness: awake  Airway & Oxygen Therapy: Patient Spontanous Breathing and Patient connected to nasal cannula oxygen; Pt on 2L at [facility]  Post-op Assessment: Report given to RN and Post -op Vital signs reviewed and stable  Post vital signs: Reviewed and stable  Last Vitals:  Vitals Value Taken Time  BP    Temp    Pulse    Resp    SpO2      Last Pain:  Vitals:   12/14/20 0858  TempSrc:   PainSc: 0-No pain         Complications: No notable events documented.

## 2020-12-14 NOTE — Op Note (Signed)
[facility] Acute Care Patient Name: Daniel [name] Procedure Date: 12/14/2020 8:55 AM MRN: [id number] Date of Birth: 1951/06/09 Attending MD: [name] ,  CSN: [id number] Age: 70 Admit Type: Outpatient Procedure:                Flexible Sigmoidoscopy Indications:              Iron deficiency anemia Providers:                [name], Caprice Kluver, Raphael Gibney,                            Technician Referring MD:              Medicines:                Monitored Anesthesia Care Complications:            No immediate complications. Estimated Blood Loss:     Estimated blood loss: none. Procedure:                Pre-Anesthesia Assessment:                           - Prior to the procedure, a History and Physical                            was performed, and patient medications, allergies                            and sensitivities were reviewed. The patient's                            tolerance of previous anesthesia was reviewed.                           - The risks and benefits of the procedure and the                            sedation options and risks were discussed with the                            patient. All questions were answered and informed                            consent was obtained.                           - ASA Grade Assessment: III - A patient with severe                            systemic disease.                           After obtaining informed consent, the scope was                            passed under direct vision. The PCF-[id number]                            (  [id number]) was introduced through the anus and                            advanced to the the rectosigmoid junction. After                            obtaining informed consent, the scope was passed                            under direct vision.The flexible sigmoidoscopy was                            accomplished without difficulty. The patient                            tolerated the  procedure well. The quality of the                            bowel preparation was poor. Scope In: 9:05:17 AM Scope Out: 9:07:57 AM Total Procedure Duration: 0 hours 2 minutes 40 seconds  Findings:      The perianal and digital rectal examinations were normal.      A large amount of semi-solid stool was found in the rectum and in the       recto-sigmoid colon, precluding visualization. Impression:               - Preparation of the colon was poor.                           - Stool in the rectum and in the recto-sigmoid                            colon.                           - No specimens collected. Moderate Sedation:      Per Anesthesia Care Recommendation:           - Discharge patient to home (ambulatory).                           - Resume previous diet. Procedure Code(s):        --- Professional ---                           [phone], 52, Sigmoidoscopy, flexible; diagnostic,                            including collection of specimen(s) by brushing or                            washing, when performed (separate procedure) Diagnosis Code(s):        --- Professional ---                           D50.9, Iron deficiency anemia, unspecified CPT copyright 2019 [employer]. All rights  reserved. The codes documented in this report are preliminary and upon coder review may  be revised to meet current compliance requirements. [name], MD [name],  12/14/2020 9:16:17 AM This report has been signed electronically. Number of Addenda: 0

## 2020-12-14 NOTE — Anesthesia Postprocedure Evaluation (Signed)
Anesthesia Post Note  Patient: Daniel [name]  Procedure(s) Performed: COLONOSCOPY WITH PROPOFOL  Patient location during evaluation: Phase II Anesthesia Type: General Level of consciousness: awake and alert and oriented Pain management: pain level controlled Vital Signs Assessment: post-procedure vital signs reviewed and stable Respiratory status: spontaneous breathing and respiratory function stable Cardiovascular status: blood pressure returned to baseline and stable Postop Assessment: no apparent nausea or vomiting Anesthetic complications: no   No notable events documented.   Last Vitals:  Vitals:   12/14/20 0808 12/14/20 0915  BP: (!) 156/88 108/68  Pulse: 99 84  Resp: 15 (!) 24  Temp: (!) 36.4 C 36.8 C  SpO2: 99% 100%    Last Pain:  Vitals:   12/14/20 0915  TempSrc: Oral  PainSc: 0-No pain                 Nitin Mckowen C Harlow Basley

## 2020-12-14 NOTE — Discharge Instructions (Addendum)
You are being discharged to home.  Resume your previous diet.  Restart Eliquis today.

## 2020-12-20 ENCOUNTER — Encounter (HOSPITAL_COMMUNITY): Payer: Self-pay | Admitting: Gastroenterology

## 2020-12-27 ENCOUNTER — Ambulatory Visit: Payer: Medicare Other | Admitting: Cardiovascular Disease

## 2021-01-29 ENCOUNTER — Other Ambulatory Visit (HOSPITAL_COMMUNITY): Payer: Medicare Other

## 2021-02-04 NOTE — Progress Notes (Signed)
[place], [place] [address]   CLINIC:  Medical Oncology/Hematology  PCP:  Leonie Douglas, MD [address] [place] [address]  [phone]  REASON FOR VISIT:  Follow-up for normocytic anemia.  PRIOR THERAPY: Iron tablet twice daily  CURRENT THERAPY: Intermittent IV Iron (Feraheme)  INTERVAL HISTORY:  [name], a 70 y.o. male, returns for routine follow-up for his normocytic anemia. Maged was last seen on 10/26/2020.  Today he reports feeling good. He reports improved appetite, and denies bloody or black stools.   REVIEW OF SYSTEMS:  Review of Systems  Constitutional:  Negative for appetite change and fatigue (60%).  Gastrointestinal:  Negative for blood in stool.  All other systems reviewed and are negative.  PAST MEDICAL/SURGICAL HISTORY:  Past Medical History:  Diagnosis Date   Acute kidney failure (HCC)    Acute respiratory failure (HCC)    Adenomatous polyp of stomach    Anemia    Benign prostate hyperplasia    Dementia ([place])    Depression    Diabetes mellitus without complication ([place])    DVT (deep venous thrombosis) ([place])    Gastric ulcer    GERD (gastroesophageal reflux disease)    High cholesterol    Hypertension    Hypo-osmolality and hyponatremia    Insomnia    Neuromuscular dysfunction of bladder    Osteoarthritis    Partial intestinal obstruction (HCC)    Pneumonitis    Protein-calorie malnutrition, moderate ([place])    Stroke ([place])    Supplemental oxygen dependent    24/7   Wheelchair bound    Past Surgical History:  Procedure Laterality Date   BIOPSY  06/21/2020   Procedure: BIOPSY;  Surgeon: Daneil Dolin, MD;  [facility];  Service: Endoscopy;;  gastric   BIOPSY  09/18/2020   Procedure: BIOPSY;  Surgeon: Harvel Quale, MD;  [facility];  Service: Gastroenterology;;   COLONOSCOPY WITH PROPOFOL N/A 12/14/2020   Procedure: COLONOSCOPY WITH PROPOFOL;   Surgeon: Harvel Quale, MD;  [facility];  Service: Gastroenterology;  Laterality: N/A;  8:55   ENDOSCOPIC MUCOSAL RESECTION N/A 11/15/2020   Procedure: ENDOSCOPIC MUCOSAL RESECTION;  Surgeon: Rush Landmark Telford Nab., MD;  Location: [place];  Service: Gastroenterology;  Laterality: N/A;   ESOPHAGOGASTRODUODENOSCOPY (EGD) WITH PROPOFOL N/A 06/21/2020   Procedure: ESOPHAGOGASTRODUODENOSCOPY (EGD) WITH PROPOFOL;  Surgeon: Daneil Dolin, MD;  [facility];  Service: Endoscopy;  Laterality: N/A;   ESOPHAGOGASTRODUODENOSCOPY (EGD) WITH PROPOFOL N/A 09/18/2020   Procedure: ESOPHAGOGASTRODUODENOSCOPY (EGD) WITH PROPOFOL;  Surgeon: Harvel Quale, MD;  [facility];  Service: Gastroenterology;  Laterality: N/A;   ESOPHAGOGASTRODUODENOSCOPY (EGD) WITH PROPOFOL N/A 11/15/2020   Procedure: ESOPHAGOGASTRODUODENOSCOPY (EGD) WITH PROPOFOL;  Surgeon: Rush Landmark Telford Nab., MD;  Location: [place];  Service: Gastroenterology;  Laterality: N/A;   FLEXIBLE SIGMOIDOSCOPY N/A 11/15/2020   Procedure: FLEXIBLE SIGMOIDOSCOPY;  Surgeon: Rush Landmark Telford Nab., MD;  Location: [facility];  Service: Gastroenterology;  Laterality: N/A;   HEMOSTASIS CLIP PLACEMENT  11/15/2020   Procedure: HEMOSTASIS CLIP PLACEMENT;  Surgeon: Irving Copas., MD;  Location: [place];  Service: Gastroenterology;;   HIP SURGERY Left    KIDNEY SURGERY     kidney removed   POLYPECTOMY  11/15/2020   Procedure: POLYPECTOMY;  Surgeon: Mansouraty, Telford Nab., MD;  Location: [place];  Service: Gastroenterology;;   SUBMUCOSAL LIFTING INJECTION  11/15/2020   Procedure: SUBMUCOSAL LIFTING INJECTION;  Surgeon: Rush Landmark Telford Nab., MD;  Location: [facility];  Service: Gastroenterology;;   SUBMUCOSAL TATTOO INJECTION  11/15/2020   Procedure: SUBMUCOSAL TATTOO INJECTION;  Surgeon: Irving Copas., MD;  Location: [facility] ENDOSCOPY;  Service: Gastroenterology;;    SOCIAL  HISTORY:  Social History   Socioeconomic History   Marital status: Single    Spouse name: Not on file   Number of children: Not on file   Years of education: Not on file   Highest education level: Not on file  Occupational History   Not on file  Tobacco Use   Smoking status: Never   Smokeless tobacco: Never  Vaping Use   Vaping Use: Never used  Substance and Sexual Activity   Alcohol use: No   Drug use: No   Sexual activity: Never  Other Topics Concern   Not on file  Social History Narrative   Not on file   Social Determinants of Health   Financial Resource Strain: Not on file  Food Insecurity: Not on file  Transportation Needs: Not on file  Physical Activity: Not on file  Stress: Not on file  Social Connections: Not on file  Intimate Partner Violence: Not At Risk   Fear of Current or Ex-Partner: No   Emotionally Abused: No   Physically Abused: No   Sexually Abused: No    FAMILY HISTORY:  Family History  Problem Relation Age of Onset   Hypertension Sister    Hyperlipidemia Sister    Osteoarthritis Sister    Rheum arthritis Sister    Breast cancer Niece    Colon cancer Neg Hx    Esophageal cancer Neg Hx    Inflammatory bowel disease Neg Hx    Liver disease Neg Hx    Pancreatic cancer Neg Hx    Stomach cancer Neg Hx    Rectal cancer Neg Hx     CURRENT MEDICATIONS:  Current Outpatient Medications  Medication Sig Dispense Refill   acetaminophen (TYLENOL) 325 MG tablet Take 2 tablets (650 mg total) by mouth every 6 (six) hours as needed for mild pain, fever or headache (or Fever >/= 101). 12 tablet 0   aMILoride (MIDAMOR) 5 MG tablet Take 1 tablet (5 mg total) by mouth daily. (Patient taking differently: Take 5 mg by mouth in the morning. (1000)) 30 tablet 5   amitriptyline (ELAVIL) 10 MG tablet Take 10 mg by mouth at bedtime. (2100)     apixaban (ELIQUIS) 5 MG TABS tablet Take 1 tablet (5 mg total) by mouth 2 (two) times daily. Start around May 12th 2022   after you finish the initial Eliquis starter pack 60 tablet 2   atorvastatin (LIPITOR) 40 MG tablet Take 1 tablet (40 mg total) by mouth daily. (Patient taking differently: Take 40 mg by mouth daily at 6 PM.) 30 tablet 4   Cholecalciferol (VITAMIN D3) 50 MCG (2000 UT) TABS Take 2,000 Units by mouth in the morning. (0900)     donepezil (ARICEPT) 10 MG tablet TAKE (1) TABLET BY MOUTH ONCE DAILY. (Patient taking differently: Take 10 mg by mouth daily. (2100)) 30 tablet PRN   dronabinol (MARINOL) 2.5 MG capsule Take 2.5 mg by mouth in the morning and at bedtime. (0800 & 1700)     feeding supplement (ENSURE ENLIVE / ENSURE PLUS) LIQD Take 237 mLs by mouth 2 (two) times daily between meals. 237 mL 12   FEROSUL 325 (65 Fe) MG tablet TAKE 1 TABLET BY MOUTH TWICE DAILY. (Patient not taking: Reported on  12/13/2020) [id number] tablet 3   folic acid (FOLVITE) A999333 MCG tablet Take 800 mcg by mouth every morning. (0800)     insulin aspart (NOVOLOG) 100 UNIT/ML FlexPen Inject 0-10 Units into the skin 3 (three) times daily with meals. insulin aspart (novoLOG) injection 0-10 Units 0-10 Units Subcutaneous, 3 times daily with meals CBG < 70: Implement Hypoglycemia Standing Orders and refer to Hypoglycemia Standing Orders sidebar report  CBG 70 - 120: 0 unit CBG 121 - 150: 0 unit  CBG 151 - 200: 1 unit CBG 201 - 250: 2 units CBG 251 - 300: 4 units CBG 301 - 350: 6 units  CBG 351 - 400: 8 units  CBG > 400: 10 units (Patient taking differently: Inject 0-10 Units into the skin 3 (three) times daily with meals. Inject as per sliding scale: 151-200= 1 unit, 201-250= 2 units, 251-300= 4 units, 301-350= 6 units, 351-400= 8 units, 401+= 10 units) 15 mL 11   Lactulose 20 GM/30ML SOLN Take 30 mLs (20 g total) by mouth See admin instructions. --Please give lactulose 20 g p.o. x1 if no bowel movement in 48 hours (Patient taking differently: Take 20 g by mouth See admin instructions. Give 20 g by mouth every 48 hours as needed for bowel management  related to partial intestinal obstruction) 236 mL 0   magnesium oxide (MAG-OX) 400 MG tablet Take 1 tablet (400 mg total) by mouth 2 (two) times daily. (Patient taking differently: Take 400 mg by mouth in the morning, at noon, and at bedtime. (1000, 1400 & 2000)) 180 tablet 5   memantine (NAMENDA) 10 MG tablet Take 10 mg by mouth 2 (two) times daily. (1000 & 1700)     metFORMIN (GLUCOPHAGE) 1000 MG tablet Take 1 tablet (1,000 mg total) by mouth 2 (two) times daily with a meal. (Patient taking differently: Take 1,000 mg by mouth in the morning and at bedtime. (1000 & 1700)) 60 tablet 3   metoCLOPramide (REGLAN) 5 MG tablet Take 1 tablet (5 mg total) by mouth 3 (three) times daily. For stomach (Patient taking differently: Take 5 mg by mouth 3 (three) times daily. (1000, 1400 & 2000) For stomach) 90 tablet 1   metoprolol tartrate (LOPRESSOR) 25 MG tablet Take 1 tablet (25 mg total) by mouth 2 (two) times daily. (Patient taking differently: Take 25 mg by mouth 2 (two) times daily. (1000 & 1700)) 60 tablet 3   mirtazapine (REMERON) 15 MG tablet Take 15 mg by mouth at bedtime. (2100)     ondansetron (ZOFRAN) 4 MG tablet Take 1 tablet (4 mg total) by mouth every 6 (six) hours as needed for nausea. 20 tablet 0   oxybutynin (DITROPAN-XL) 5 MG 24 hr tablet Take 5 mg by mouth daily. (1000)     OXYGEN Inhale 2 L into the lungs continuous.     pantoprazole (PROTONIX) 40 MG tablet Take 1 tablet (40 mg total) by mouth 2 (two) times daily before a meal. 180 tablet 3   polyethylene glycol (MIRALAX) 17 g packet Take 17 g by mouth 2 (two) times daily. For Constipation (Patient taking differently: Take 17 g by mouth 2 (two) times daily. (0800 & 2000) For Constipation) 60 each 2   pyridostigmine (MESTINON) 60 MG tablet Take 1 tablet (60 mg total) by mouth 3 (three) times daily. (Patient taking differently: Take 60 mg by mouth 3 (three) times daily. (1000, 1400 & 2100)) 270 tablet 3   senna (SENOKOT) 8.6 MG TABS tablet Take  2 tablets  by mouth at bedtime. (2000)     senna-docusate (SENOKOT-S) 8.6-50 MG tablet Take 2 tablets by mouth at bedtime. 60 tablet 3   sucralfate (CARAFATE) 1 g tablet Take 1 tablet (1 g total) by mouth 4 (four) times daily. (Patient taking differently: Take 1 g by mouth 4 (four) times daily. (0900, 1300, 1700 & 2100)) 120 tablet 1   tamsulosin (FLOMAX) 0.4 MG CAPS capsule TAKE 1 CAPSULE BY MOUTH AT BEDTIME. (Patient taking differently: Take 0.4 mg by mouth at bedtime. (2100)) 90 capsule 1   traZODone (DESYREL) 100 MG tablet Take 100 mg by mouth at bedtime. (2100)     vitamin B-12 (CYANOCOBALAMIN) 500 MCG tablet Take 1,000 mcg by mouth daily. (0900)     No current facility-administered medications for this visit.    ALLERGIES:  No Known Allergies  PHYSICAL EXAM:  Performance status (ECOG): 2 - Symptomatic, <50% confined to bed  There were no vitals filed for this visit. Wt Readings from Last 3 Encounters:  12/13/20 141 lb (64 kg)  12/12/20 146 lb (66.2 kg)  11/15/20 146 lb (66.2 kg)   Physical Exam Vitals reviewed.  Constitutional:      Appearance: Normal appearance.     Comments: In wheelchair  Cardiovascular:     Rate and Rhythm: Normal rate and regular rhythm.     Pulses: Normal pulses.     Heart sounds: Normal heart sounds.  Pulmonary:     Effort: Pulmonary effort is normal.     Breath sounds: Normal breath sounds.  Neurological:     General: No focal deficit present.     Mental Status: He is alert and oriented to person, place, and time.  Psychiatric:        Mood and Affect: Mood normal.        Behavior: Behavior normal.    LABORATORY DATA:  I have reviewed the labs as listed.  CBC Latest Ref Rng & Units 12/12/2020 10/11/2020 09/19/2020  WBC 4.0 - 10.5 K/uL 5.3 4.1 7.3  Hemoglobin 13.0 - 17.0 g/dL 11.4(L) 10.4(L) 9.1(L)  Hematocrit 39.0 - 52.0 % 35.2(L) 34.8(L) 28.4(L)  Platelets 150 - 400 K/uL 367 279 323   CMP Latest Ref Rng & Units 12/12/2020 10/11/2020 09/19/2020   Glucose 70 - 99 mg/dL 249(H) 91 124(H)  BUN 8 - 23 mg/dL 40(H) 19 15  Creatinine 0.61 - 1.24 mg/dL 1.04 0.99 1.03  Sodium 135 - 145 mmol/L 135 130(L) 134(L)  Potassium 3.5 - 5.1 mmol/L 4.4 5.3(H) 4.1  Chloride 98 - 111 mmol/L 96(L) 97(L) 101  CO2 22 - 32 mmol/L '27 25 22  '$ Calcium 8.9 - 10.3 mg/dL 11.4(H) 10.9(H) 9.0  Total Protein 6.5 - 8.1 g/dL - 6.7 -  Total Bilirubin 0.3 - 1.2 mg/dL - 0.4 -  Alkaline Phos 38 - 126 U/L - 70 -  AST 15 - 41 U/L - 12(L) -  ALT 0 - 44 U/L - 14 -      Component Value Date/Time   RBC 4.16 (L) 12/12/2020 1002   MCV 84.6 12/12/2020 1002   MCH 27.4 12/12/2020 1002   MCHC 32.4 12/12/2020 1002   RDW 15.8 (H) 12/12/2020 1002   LYMPHSABS 0.8 12/12/2020 1002   MONOABS 0.3 12/12/2020 1002   EOSABS 0.1 12/12/2020 1002   BASOSABS 0.0 12/12/2020 1002    DIAGNOSTIC IMAGING:  I have independently reviewed the scans and discussed with the patient. No results found.   ASSESSMENT:  1.  Normocytic anemia: - Patient seen  at the request of Dr. Jenetta Downer for further management of normocytic anemia. - He had history of GI bleed secondary to severe esophagitis, gastric ulcers. 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 - He is scheduled for colonoscopy in June of this year. - Much of the history provided by patient's legal guardian [name]. - She reports that he had lost 50 pounds since August 2021 and gained some weight back. - CBC on 10/09/2020 done at [facility] in [place] showed hemoglobin 8.5 with MCV 83.7.  Hemoglobin was 7.7 on 09/28/2020. -He is currently taking iron tablet twice daily, 123456 and folic acid [id number] mcg each.   2.  Social/family history: - He is now a resident at [facility] in [place].  He is seen with his legal  guardian, [name]. - No family history of sickle cell or thalassemia.  Niece has breast cancer   PLAN:  1.  Normocytic anemia: - He is continuing to take iron tablet twice daily for many years along with 123456, folic acid daily. - Colonoscopy on 12/28/2020 showed no bleeding. - Reviewed labs from today which shows ferritin has decreased to 33.  Percent saturation is 12. - As there is no improvement with oral iron, recommend parenteral iron therapy with Feraheme weekly x2. - We discussed rare chance of allergic reactions. - We will see him back in 4 months with repeat labs.   2.  Mild hypercalcemia: - Calcium level today is 11.1. - I have reviewed his medications and he is taking vitamin D 2000 units daily. - We have sent a PTH level today. - Recommend decreasing vitamin D 2000 units daily.    Orders placed this encounter:  No orders of the defined types were placed in this encounter.    Derek Jack, MD [place] [phone]   I, Thana Ates, am acting as a scribe for Dr. Derek Jack.  I, Derek Jack MD, have reviewed the above documentation for accuracy and completeness, and I agree with the above.

## 2021-02-05 ENCOUNTER — Other Ambulatory Visit: Payer: Self-pay

## 2021-02-05 ENCOUNTER — Inpatient Hospital Stay (HOSPITAL_COMMUNITY): Payer: Medicare Other

## 2021-02-05 ENCOUNTER — Inpatient Hospital Stay (HOSPITAL_COMMUNITY): Payer: Medicare Other | Attending: Hematology | Admitting: Hematology

## 2021-02-05 VITALS — BP 151/80 | HR 76 | Temp 97.3°F | Resp 18 | Wt 143.0 lb

## 2021-02-05 DIAGNOSIS — Z8673 Personal history of transient ischemic attack (TIA), and cerebral infarction without residual deficits: Secondary | ICD-10-CM | POA: Diagnosis not present

## 2021-02-05 DIAGNOSIS — D649 Anemia, unspecified: Secondary | ICD-10-CM

## 2021-02-05 DIAGNOSIS — Z7901 Long term (current) use of anticoagulants: Secondary | ICD-10-CM | POA: Insufficient documentation

## 2021-02-05 DIAGNOSIS — Z79899 Other long term (current) drug therapy: Secondary | ICD-10-CM | POA: Diagnosis not present

## 2021-02-05 DIAGNOSIS — D500 Iron deficiency anemia secondary to blood loss (chronic): Secondary | ICD-10-CM

## 2021-02-05 DIAGNOSIS — E7800 Pure hypercholesterolemia, unspecified: Secondary | ICD-10-CM | POA: Diagnosis not present

## 2021-02-05 DIAGNOSIS — I10 Essential (primary) hypertension: Secondary | ICD-10-CM | POA: Insufficient documentation

## 2021-02-05 DIAGNOSIS — Z803 Family history of malignant neoplasm of breast: Secondary | ICD-10-CM | POA: Insufficient documentation

## 2021-02-05 DIAGNOSIS — Z9981 Dependence on supplemental oxygen: Secondary | ICD-10-CM | POA: Insufficient documentation

## 2021-02-05 DIAGNOSIS — E119 Type 2 diabetes mellitus without complications: Secondary | ICD-10-CM | POA: Diagnosis not present

## 2021-02-05 DIAGNOSIS — Z86718 Personal history of other venous thrombosis and embolism: Secondary | ICD-10-CM | POA: Diagnosis not present

## 2021-02-05 DIAGNOSIS — Z993 Dependence on wheelchair: Secondary | ICD-10-CM | POA: Diagnosis not present

## 2021-02-05 DIAGNOSIS — Z794 Long term (current) use of insulin: Secondary | ICD-10-CM | POA: Diagnosis not present

## 2021-02-05 DIAGNOSIS — E440 Moderate protein-calorie malnutrition: Secondary | ICD-10-CM

## 2021-02-05 LAB — CBC WITH DIFFERENTIAL/PLATELET
Abs Immature Granulocytes: 0.04 10*3/uL (ref 0.00–0.07)
Basophils Absolute: 0 10*3/uL (ref 0.0–0.1)
Basophils Relative: 0 %
Eosinophils Absolute: 0.1 10*3/uL (ref 0.0–0.5)
Eosinophils Relative: 2 %
HCT: 35.2 % — ABNORMAL LOW (ref 39.0–52.0)
Hemoglobin: 11.2 g/dL — ABNORMAL LOW (ref 13.0–17.0)
Immature Granulocytes: 1 %
Lymphocytes Relative: 17 %
Lymphs Abs: 0.9 10*3/uL (ref 0.7–4.0)
MCH: 27.1 pg (ref 26.0–34.0)
MCHC: 31.8 g/dL (ref 30.0–36.0)
MCV: 85.2 fL (ref 80.0–100.0)
Monocytes Absolute: 0.5 10*3/uL (ref 0.1–1.0)
Monocytes Relative: 9 %
Neutro Abs: 4 10*3/uL (ref 1.7–7.7)
Neutrophils Relative %: 71 %
Platelets: 254 10*3/uL (ref 150–400)
RBC: 4.13 MIL/uL — ABNORMAL LOW (ref 4.22–5.81)
RDW: 18.9 % — ABNORMAL HIGH (ref 11.5–15.5)
WBC: 5.6 10*3/uL (ref 4.0–10.5)
nRBC: 0 % (ref 0.0–0.2)

## 2021-02-05 LAB — FERRITIN: Ferritin: 33 ng/mL (ref 24–336)

## 2021-02-05 LAB — COMPREHENSIVE METABOLIC PANEL
ALT: 11 U/L (ref 0–44)
AST: 12 U/L — ABNORMAL LOW (ref 15–41)
Albumin: 3.9 g/dL (ref 3.5–5.0)
Alkaline Phosphatase: 86 U/L (ref 38–126)
Anion gap: 10 (ref 5–15)
BUN: 40 mg/dL — ABNORMAL HIGH (ref 8–23)
CO2: 27 mmol/L (ref 22–32)
Calcium: 11.1 mg/dL — ABNORMAL HIGH (ref 8.9–10.3)
Chloride: 95 mmol/L — ABNORMAL LOW (ref 98–111)
Creatinine, Ser: 1.24 mg/dL (ref 0.61–1.24)
GFR, Estimated: >60 mL/min (ref >60)
Glucose, Bld: 228 mg/dL — ABNORMAL HIGH (ref 70–99)
Potassium: 4.5 mmol/L (ref 3.5–5.1)
Sodium: 132 mmol/L — ABNORMAL LOW (ref 135–145)
Total Bilirubin: 0.2 mg/dL — ABNORMAL LOW (ref 0.3–1.2)
Total Protein: 7.6 g/dL (ref 6.5–8.1)

## 2021-02-05 LAB — VITAMIN D 25 HYDROXY (VIT D DEFICIENCY, FRACTURES): Vit D, 25-Hydroxy: 63.51 ng/mL (ref 30–100)

## 2021-02-05 LAB — IRON AND TIBC
Iron: 45 ug/dL (ref 45–182)
Saturation Ratios: 12 % — ABNORMAL LOW (ref 17.9–39.5)
TIBC: 364 ug/dL (ref 250–450)
UIBC: 319 ug/dL

## 2021-02-05 LAB — MAGNESIUM: Magnesium: 1.3 mg/dL — ABNORMAL LOW (ref 1.7–2.4)

## 2021-02-05 NOTE — Patient Instructions (Addendum)
[facility] °Discharge Instructions ° °You were seen today by Dr. Katragadda. He went over your recent results. Dr. Katragadda will see you back in 4 months for labs and follow up. ° ° °Thank you for choosing [facility] at [facility] to provide your oncology and hematology care.  To afford each patient quality time with our provider, please arrive at least 15 minutes before your scheduled appointment time.  ° °If you have a lab appointment with the Cancer Center please come in thru the Main Entrance and check in at the main information desk ° °You need to re-schedule your appointment should you arrive 10 or more minutes late.  We strive to give you quality time with our providers, and arriving late affects you and other patients whose appointments are after yours.  Also, if you no show three or more times for appointments you may be dismissed from the clinic at the providers discretion.     °Again, thank you for choosing [facility].  Our hope is that these requests will decrease the amount of time that you wait before being seen by our physicians.       °_____________________________________________________________ ° °Should you have questions after your visit to [facility], please contact our office at [phone] between the hours of 8:00 a.m. and 4:30 p.m.  Voicemails left after 4:00 p.m. will not be returned until the following business day.  For prescription refill requests, have your pharmacy contact our office and allow 72 hours.   ° °Cancer Center Support Programs:  ° °> Cancer Support Group  °2nd Tuesday of the month 1pm-2pm, Journey Room  ° ° °

## 2021-02-06 LAB — PTH, INTACT AND CALCIUM
Calcium, Total (PTH): 11.2 mg/dL — ABNORMAL HIGH (ref 8.6–10.2)
PTH: 20 pg/mL (ref 15–65)

## 2021-05-16 ENCOUNTER — Encounter (HOSPITAL_COMMUNITY): Payer: Self-pay | Admitting: Hematology

## 2021-05-16 ENCOUNTER — Encounter (INDEPENDENT_AMBULATORY_CARE_PROVIDER_SITE_OTHER): Payer: Self-pay | Admitting: Gastroenterology

## 2021-05-16 ENCOUNTER — Other Ambulatory Visit: Payer: Self-pay

## 2021-05-16 ENCOUNTER — Ambulatory Visit (INDEPENDENT_AMBULATORY_CARE_PROVIDER_SITE_OTHER): Payer: Medicare Other | Admitting: Gastroenterology

## 2021-05-16 VITALS — BP 155/83 | HR 87 | Ht 71.0 in | Wt 158.6 lb

## 2021-05-16 DIAGNOSIS — D132 Benign neoplasm of duodenum: Secondary | ICD-10-CM | POA: Diagnosis not present

## 2021-05-16 DIAGNOSIS — D509 Iron deficiency anemia, unspecified: Secondary | ICD-10-CM

## 2021-05-16 NOTE — Patient Instructions (Addendum)
Perform blood workup Follow up with hematology for possible iron infusion Will discuss with guardian possibility of attempting colonoscopy with an extended bowel prep

## 2021-05-16 NOTE — Progress Notes (Signed)
Daniel [name], M.D. Gastroenterology & Hepatology [facility] [address] [place], [place] [address]  Primary Care Physician: Leonie Douglas, MD [address] [place] [address]  I will communicate my assessment and recommendations to the referring MD via EMR.  Problems: Iron deficiency anemia Duodenal adenomas x2 Gastric ileus  History of Present Illness: Daniel [name] is a 70 y.o. male with past medical history of diabetes, hyperlipidemia, hypertension, history of GI bleed secondary to severe esophagitis, recurrent gastric issues, cognitive impairment and CVA, who presents for follow up of gastric ileus and IDA.  The patient was last seen on 11/13/2020. At that time, the patient was scheduled to undergo colonoscopy for evaluation of for EGD and colonoscopy for evaluation of iron deficiency anemia and management of duodenal adenomas.  Esophagogastroduodenospy performed in June and with findings described below.  Colonoscopy  on the same day of the esophagogastroduodenospy but there was presence of large amount of stool.  There was possible internal angulation of the sigmoid colon with dilation at that point.  There was a 8 mm tubular adenoma which was resected from the sigmoid: And there was presence of external and internal hemorrhoids upon retroflexion.  Due to his poor prep he was rescheduled for colonoscopy on 12/14/2020 but the prep was also poor.  Due to having poor prep x2, his guardian decided to hold off on performing a repeat endoscopic evaluation at that time.  Patient comes with a nursing home staff who helps providing most of the information.  Per nursing home staff, the patient has been well. He reports feeling fine. The patient denies having any nausea, vomiting, fever, chills, hematochezia, melena, hematemesis, abdominal distention, abdominal pain, diarrhea, jaundice, pruritus or weight loss. The patient has been moving  his bowels almost on a daily basis.  Has been eating well and actually has been gaining weight.  He is only requiring supplemental oxygen very rarely as he was requiring oxygen permanently due to his COVID-19 infection.  Patient has not received any IV iron.  Last EGD:11/15/2020, 4 cm hiatal hernia, there was presence of angulation of the gastric body.  There was presence of moderate gastritis with healed ulcers in the gastric body.  Biopsies of the stomach showed.  There was 25 mm polyp which was resected from the second portion of the duodenum via cold endoscopic mucosal resection, clipped x6.  Proximal tattoo was injected.  There was segment 6 mm duodenal adenoma in the third portion of the small bowel which was resected via cold endoscopic mucosal resection.  2 clips were placed and the area was tattooed proximally.    Pathology of the larger adenoma possible high-grade dysplasia but this is not clear completely, second polyp was a duodenal adenoma.  Recommended to have a repeat EGD in 9 to 12 months.  Past Medical History: Past Medical History:  Diagnosis Date   Acute kidney failure ([place])    Acute respiratory failure (HCC)    Adenomatous polyp of stomach    Anemia    Benign prostate hyperplasia    Dementia (HCC)    Depression    Diabetes mellitus without complication (HCC)    DVT (deep venous thrombosis) (H[facility])    Gastric ulcer    GERD (gastroesophageal reflux disease)    High cholesterol    Hypertension    Hypo-osmolality and hyponatremia    Insomnia    Neuromuscular dysfunction of bladder    Osteoarthritis    Partial intestinal  obstruction ([place])    Pneumonitis    Protein-calorie malnutrition, moderate ([place])    Stroke ([place])    Supplemental oxygen dependent    24/7   Wheelchair bound     Past Surgical History: Past Surgical History:  Procedure Laterality Date   BIOPSY  06/21/2020   Procedure: BIOPSY;  Surgeon: Daneil Dolin, MD;  [facility];  Service:  Endoscopy;;  gastric   BIOPSY  09/18/2020   Procedure: BIOPSY;  Surgeon: Harvel Quale, MD;  [facility];  Service: Gastroenterology;;   COLONOSCOPY WITH PROPOFOL N/A 12/14/2020   Procedure: COLONOSCOPY WITH PROPOFOL;  Surgeon: Harvel Quale, MD;  [facility];  Service: Gastroenterology;  Laterality: N/A;  8:55   ENDOSCOPIC MUCOSAL RESECTION N/A 11/15/2020   Procedure: ENDOSCOPIC MUCOSAL RESECTION;  Surgeon: Rush Landmark Telford Nab., MD;  Location: [place];  Service: Gastroenterology;  Laterality: N/A;   ESOPHAGOGASTRODUODENOSCOPY (EGD) WITH PROPOFOL N/A 06/21/2020   Procedure: ESOPHAGOGASTRODUODENOSCOPY (EGD) WITH PROPOFOL;  Surgeon: Daneil Dolin, MD;  [facility];  Service: Endoscopy;  Laterality: N/A;   ESOPHAGOGASTRODUODENOSCOPY (EGD) WITH PROPOFOL N/A 09/18/2020   Procedure: ESOPHAGOGASTRODUODENOSCOPY (EGD) WITH PROPOFOL;  Surgeon: Harvel Quale, MD;  [facility];  Service: Gastroenterology;  Laterality: N/A;   ESOPHAGOGASTRODUODENOSCOPY (EGD) WITH PROPOFOL N/A 11/15/2020   Procedure: ESOPHAGOGASTRODUODENOSCOPY (EGD) WITH PROPOFOL;  Surgeon: Rush Landmark Telford Nab., MD;  Location: [place];  Service: Gastroenterology;  Laterality: N/A;   FLEXIBLE SIGMOIDOSCOPY N/A 11/15/2020   Procedure: FLEXIBLE SIGMOIDOSCOPY;  Surgeon: Rush Landmark Telford Nab., MD;  Location: [place];  Service: Gastroenterology;  Laterality: N/A;   HEMOSTASIS CLIP PLACEMENT  11/15/2020   Procedure: HEMOSTASIS CLIP PLACEMENT;  Surgeon: Rush Landmark Telford Nab., MD;  [facility];  Service: Gastroenterology;;   HIP SURGERY Left    KIDNEY SURGERY     kidney removed   POLYPECTOMY  11/15/2020   Procedure: POLYPECTOMY;  Surgeon: Mansouraty, Telford Nab., MD;  Location: [place];  Service: Gastroenterology;;   SUBMUCOSAL LIFTING INJECTION  11/15/2020   Procedure: SUBMUCOSAL LIFTING INJECTION;  Surgeon: Irving Copas., MD;   Location: [facility] ENDOSCOPY;  Service: Gastroenterology;;   SUBMUCOSAL TATTOO INJECTION  11/15/2020   Procedure: SUBMUCOSAL TATTOO INJECTION;  Surgeon: Irving Copas., MD;  Location: [facility] ENDOSCOPY;  Service: Gastroenterology;;    Family History: Family History  Problem Relation Age of Onset   Hypertension Sister    Hyperlipidemia Sister    Osteoarthritis Sister    Rheum arthritis Sister    Breast cancer Niece    Colon cancer Neg Hx    Esophageal cancer Neg Hx    Inflammatory bowel disease Neg Hx    Liver disease Neg Hx    Pancreatic cancer Neg Hx    Stomach cancer Neg Hx    Rectal cancer Neg Hx     Social History: Social History   Tobacco Use  Smoking Status Never  Smokeless Tobacco Never   Social History   Substance and Sexual Activity  Alcohol Use No   Social History   Substance and Sexual Activity  Drug Use No    Allergies: No Known Allergies  Medications: Current Outpatient Medications  Medication Sig Dispense Refill   amitriptyline (ELAVIL) 10 MG tablet Take 10 mg by mouth at bedtime. (2100)     atorvastatin (LIPITOR) 40 MG tablet Take 1 tablet (40 mg total) by mouth daily. (Patient taking differently: Take 40 mg by mouth daily at 6 PM.) 30 tablet 4   donepezil (  ARICEPT) 10 MG tablet TAKE (1) TABLET BY MOUTH ONCE DAILY. (Patient taking differently: Take 5 mg by mouth daily. (2100)) 30 tablet PRN   feeding supplement (ENSURE ENLIVE / ENSURE PLUS) LIQD Take 237 mLs by mouth 2 (two) times daily between meals. 237 mL 12   Ferrous Sulfate 220 (44 Fe) MG/5ML LIQD Take 7.5 mLs by mouth 2 (two) times daily.     folic acid (FOLVITE) 578 MCG tablet Take 800 mcg by mouth every morning. (0800)     insulin glargine (LANTUS) 100 UNIT/ML injection Inject 7 Units into the skin daily. 7 units SQ QHS.     Insulin Lispro (HUMALOG KWIKPEN [place]) Inject into the skin. 7 units Q am     Lactulose 20 GM/30ML SOLN Take 30 mLs (20 g total) by mouth See admin instructions. --Please give  lactulose 20 g p.o. x1 if no bowel movement in 48 hours (Patient taking differently: Take 20 g by mouth See admin instructions. Give 20 g by mouth every 48 hours as needed for bowel management related to partial intestinal obstruction) 236 mL 0   magnesium oxide (MAG-OX) 400 MG tablet Take 1 tablet (400 mg total) by mouth 2 (two) times daily. 180 tablet 5   metFORMIN (GLUCOPHAGE) 1000 MG tablet Take 1 tablet (1,000 mg total) by mouth 2 (two) times daily with a meal. (Patient taking differently: Take 1,000 mg by mouth in the morning and at bedtime. (1000 & 1700)) 60 tablet 3   metoprolol tartrate (LOPRESSOR) 25 MG tablet Take 1 tablet (25 mg total) by mouth 2 (two) times daily. (Patient taking differently: Take 25 mg by mouth 2 (two) times daily. (1000 & 1700)) 60 tablet 3   omeprazole (PRILOSEC) 40 MG capsule Take 40 mg by mouth 2 (two) times daily.     ondansetron (ZOFRAN) 4 MG tablet Take 1 tablet (4 mg total) by mouth every 6 (six) hours as needed for nausea. 20 tablet 0   polyethylene glycol (MIRALAX) 17 g packet Take 17 g by mouth 2 (two) times daily. For Constipation (Patient taking differently: Take 17 g by mouth 2 (two) times daily. (0800 & 2000) For Constipation) 60 each 2   pyridostigmine (MESTINON) 60 MG tablet Take 1 tablet (60 mg total) by mouth 3 (three) times daily. 270 tablet 3   senna-docusate (SENOKOT-S) 8.6-50 MG tablet Take 2 tablets by mouth at bedtime. (Patient taking differently: Take 2 tablets by mouth 2 (two) times daily.) 60 tablet 3   simethicone (MYLICON) 469 MG chewable tablet Chew 125 mg by mouth every 6 (six) hours as needed for flatulence.     sucralfate (CARAFATE) 1 g tablet Take 1 tablet (1 g total) by mouth 4 (four) times daily. (Patient taking differently: Take 1 g by mouth 4 (four) times daily. (0900, 1300, 1700 & 2100)) 120 tablet 1   tamsulosin (FLOMAX) 0.4 MG CAPS capsule TAKE 1 CAPSULE BY MOUTH AT BEDTIME. (Patient taking differently: Take 0.4 mg by mouth at  bedtime. (2100)) 90 capsule 1   traZODone (DESYREL) 100 MG tablet Take 100 mg by mouth at bedtime. (2100)     vitamin B-12 (CYANOCOBALAMIN) 500 MCG tablet Take 1,000 mcg by mouth daily. (0900)     XARELTO 20 MG TABS tablet Take 20 mg by mouth daily.     aMILoride (MIDAMOR) 5 MG tablet Take 1 tablet (5 mg total) by mouth daily. (Patient taking differently: Take 5 mg by mouth in the morning. (1000)) 30 tablet 5   amLODipine (  NORVASC) 5 MG tablet Take 5 mg by mouth daily.     apixaban (ELIQUIS) 5 MG TABS tablet Take 1 tablet (5 mg total) by mouth 2 (two) times daily. Start around May 12th 2022  after you finish the initial Eliquis starter pack 60 tablet 2   dronabinol (MARINOL) 2.5 MG capsule Take 2.5 mg by mouth in the morning and at bedtime. (0800 & 1700)     metoCLOPramide (REGLAN) 5 MG tablet Take 1 tablet (5 mg total) by mouth 3 (three) times daily. For stomach (Patient not taking: Reported on 05/16/2021) 90 tablet 1   pantoprazole (PROTONIX) 40 MG tablet Take 1 tablet (40 mg total) by mouth 2 (two) times daily before a meal. 180 tablet 3   No current facility-administered medications for this visit.    Review of Systems: GENERAL: negative for malaise, night sweats HEENT: No changes in hearing or vision, no nose bleeds or other nasal problems. NECK: Negative for lumps, goiter, pain and significant neck swelling RESPIRATORY: Negative for cough, wheezing CARDIOVASCULAR: Negative for chest pain, leg swelling, palpitations, orthopnea GI: SEE HPI MUSCULOSKELETAL: Negative for joint pain or swelling, back pain, and muscle pain. SKIN: Negative for lesions, rash PSYCH: Negative for sleep disturbance, mood disorder and recent psychosocial stressors. HEMATOLOGY Negative for prolonged bleeding, bruising easily, and swollen nodes. ENDOCRINE: Negative for cold or heat intolerance, polyuria, polydipsia and goiter. NEURO: negative for tremor, gait imbalance, syncope and seizures. The remainder of the  review of systems is noncontributory.   Physical Exam: BP (!) 155/83 (BP Location: Left Arm, Patient Position: Sitting, Cuff Size: Small)   Pulse 87   Ht 5\' 11"  (1.803 m)   Wt 158 lb 9.6 oz (71.9 kg) Comment: per NH  BMI 22.12 kg/m  GENERAL: The patient is AO x3, in no acute distress. HEENT: Head is normocephalic and atraumatic. EOMI are intact. Mouth is well hydrated and without lesions. NECK: Supple. No masses LUNGS: Clear to auscultation. No presence of rhonchi/wheezing/rales. Adequate chest expansion HEART: RRR, normal s1 and s2. ABDOMEN: Soft, nontender, no guarding, no peritoneal signs, and nondistended. BS +. No masses. EXTREMITIES: Without any cyanosis, clubbing, rash, lesions or edema. NEUROLOGIC: AOx3, no focal motor deficit. SKIN: no jaundice, no rashes  Imaging/Labs: as above  I personally reviewed and interpreted the available labs, imaging and endoscopic files.  Impression and Plan: Daniel [name] is a 70 y.o. male with past medical history of diabetes, hyperlipidemia, hypertension, history of GI bleed secondary to severe esophagitis, recurrent gastric issues, cognitive impairment and CVA, who presents for follow up of gastric ileus and IDA.  The patient presented significant iron deficiency anemia requiring blood transfusion.  He is currently on oral iron supplementation and has not required any repeat blood transfusion.  He has not presented any melena or hematochezia, no concern for ongoing gastrointestinal bleeding.  However, it will be important to check his current hemoglobin and iron stores to determine if he requires IV iron infusion.  He should also follow with his hematologist.  No upper sources have been found to explain his iron deficiency anemia but it has been difficult to determine if he has any lesions in his colon causing this as he has had to poor preps in his most recent colonoscopies.  I will discuss with his guardian if she would like to proceed with a  colonoscopy for further evaluation of his anemia as it seems that he had a hard time last time he took his bowel prep.  Finally,  he will need to follow-up with Dr. Rush Landmark for repeat EGD 9 to 12 months after his most recent EGD for surveillance of his  duodenal adenoma endoscopic mucosal resection .  - Check CBC and iron stores - Follow up with hematology for possible iron infusion - Will discuss with guardian possibility of attempting colonoscopy with an extended bowel prep - Repeat EGD with Dr. Rush Landmark 9-12 months after most recent EGD  ADDENDUM: I discussed the case with his legal guardian Ms. [name] Price.  After discussing thoroughly the case with her, she explained that she would like to hold off on performing any colonoscopies for now as he did not tolerate well the bowel prep previously and is currently doing well clinically.  She will reach back to [place] to determine the best timing to schedule this procedure and she understood that a long prep will be needed for this.  All questions were answered.      Harvel Quale, MD Gastroenterology and Hepatology [facility]

## 2021-06-18 ENCOUNTER — Inpatient Hospital Stay (HOSPITAL_COMMUNITY): Payer: Medicare Other | Attending: Hematology

## 2021-06-18 ENCOUNTER — Other Ambulatory Visit: Payer: Self-pay

## 2021-06-18 DIAGNOSIS — Z79899 Other long term (current) drug therapy: Secondary | ICD-10-CM | POA: Diagnosis not present

## 2021-06-18 DIAGNOSIS — I10 Essential (primary) hypertension: Secondary | ICD-10-CM | POA: Insufficient documentation

## 2021-06-18 DIAGNOSIS — Z7901 Long term (current) use of anticoagulants: Secondary | ICD-10-CM | POA: Insufficient documentation

## 2021-06-18 DIAGNOSIS — D649 Anemia, unspecified: Secondary | ICD-10-CM | POA: Insufficient documentation

## 2021-06-18 DIAGNOSIS — Z7984 Long term (current) use of oral hypoglycemic drugs: Secondary | ICD-10-CM | POA: Diagnosis not present

## 2021-06-18 DIAGNOSIS — D500 Iron deficiency anemia secondary to blood loss (chronic): Secondary | ICD-10-CM

## 2021-06-18 DIAGNOSIS — E119 Type 2 diabetes mellitus without complications: Secondary | ICD-10-CM | POA: Diagnosis not present

## 2021-06-18 DIAGNOSIS — Z86718 Personal history of other venous thrombosis and embolism: Secondary | ICD-10-CM | POA: Insufficient documentation

## 2021-06-18 DIAGNOSIS — Z794 Long term (current) use of insulin: Secondary | ICD-10-CM | POA: Insufficient documentation

## 2021-06-18 LAB — IRON AND TIBC
Iron: 159 ug/dL (ref 45–182)
Saturation Ratios: 45 % — ABNORMAL HIGH (ref 17.9–39.5)
TIBC: 356 ug/dL (ref 250–450)
UIBC: 197 ug/dL

## 2021-06-18 LAB — COMPREHENSIVE METABOLIC PANEL
ALT: 14 U/L (ref 0–44)
AST: 13 U/L — ABNORMAL LOW (ref 15–41)
Albumin: 3.9 g/dL (ref 3.5–5.0)
Alkaline Phosphatase: 100 U/L (ref 38–126)
Anion gap: 10 (ref 5–15)
BUN: 32 mg/dL — ABNORMAL HIGH (ref 8–23)
CO2: 26 mmol/L (ref 22–32)
Calcium: 10.8 mg/dL — ABNORMAL HIGH (ref 8.9–10.3)
Chloride: 99 mmol/L (ref 98–111)
Creatinine, Ser: 1.14 mg/dL (ref 0.61–1.24)
GFR, Estimated: >60 mL/min (ref >60)
Glucose, Bld: 209 mg/dL — ABNORMAL HIGH (ref 70–99)
Potassium: 4 mmol/L (ref 3.5–5.1)
Sodium: 135 mmol/L (ref 135–145)
Total Bilirubin: 0.3 mg/dL (ref 0.3–1.2)
Total Protein: 7.4 g/dL (ref 6.5–8.1)

## 2021-06-18 LAB — FERRITIN: Ferritin: 34 ng/mL (ref 24–336)

## 2021-06-18 LAB — CBC WITH DIFFERENTIAL/PLATELET
Abs Immature Granulocytes: 0.01 10*3/uL (ref 0.00–0.07)
Basophils Absolute: 0 10*3/uL (ref 0.0–0.1)
Basophils Relative: 0 %
Eosinophils Absolute: 0.2 10*3/uL (ref 0.0–0.5)
Eosinophils Relative: 4 %
HCT: 38.8 % — ABNORMAL LOW (ref 39.0–52.0)
Hemoglobin: 12.5 g/dL — ABNORMAL LOW (ref 13.0–17.0)
Immature Granulocytes: 0 %
Lymphocytes Relative: 20 %
Lymphs Abs: 1.2 10*3/uL (ref 0.7–4.0)
MCH: 28 pg (ref 26.0–34.0)
MCHC: 32.2 g/dL (ref 30.0–36.0)
MCV: 87 fL (ref 80.0–100.0)
Monocytes Absolute: 0.5 10*3/uL (ref 0.1–1.0)
Monocytes Relative: 9 %
Neutro Abs: 4 10*3/uL (ref 1.7–7.7)
Neutrophils Relative %: 67 %
Platelets: 219 10*3/uL (ref 150–400)
RBC: 4.46 MIL/uL (ref 4.22–5.81)
RDW: 15.1 % (ref 11.5–15.5)
WBC: 5.9 10*3/uL (ref 4.0–10.5)
nRBC: 0 % (ref 0.0–0.2)

## 2021-06-19 LAB — PTH, INTACT AND CALCIUM
Calcium, Total (PTH): 11.3 mg/dL — ABNORMAL HIGH (ref 8.6–10.2)
PTH: 25 pg/mL (ref 15–65)

## 2021-06-25 ENCOUNTER — Other Ambulatory Visit: Payer: Self-pay

## 2021-06-25 ENCOUNTER — Encounter (HOSPITAL_COMMUNITY): Payer: Self-pay | Admitting: Hematology

## 2021-06-25 ENCOUNTER — Inpatient Hospital Stay (HOSPITAL_BASED_OUTPATIENT_CLINIC_OR_DEPARTMENT_OTHER): Payer: Medicare Other | Admitting: Hematology

## 2021-06-25 VITALS — BP 146/90 | HR 100 | Temp 98.9°F | Resp 18

## 2021-06-25 DIAGNOSIS — D649 Anemia, unspecified: Secondary | ICD-10-CM | POA: Diagnosis not present

## 2021-06-25 DIAGNOSIS — D500 Iron deficiency anemia secondary to blood loss (chronic): Secondary | ICD-10-CM | POA: Diagnosis not present

## 2021-06-25 NOTE — Patient Instructions (Signed)
Daniel [name] at [facility] Discharge Instructions   You were seen and examined today by Dr. Delton Coombes.  He reviewed your lab results.  Your hemoglobin has improved to 12.  Continue liquid iron by mouth twice daily as you have been taking.   Return as scheduled in 6 months.    Thank you for choosing [place] at [facility] to provide your oncology and hematology care.  To afford each patient quality time with our provider, please arrive at least 15 minutes before your scheduled appointment time.   If you have a lab appointment with the [facility] please come in thru the Main Entrance and check in at the main information desk.  You need to re-schedule your appointment should you arrive 10 or more minutes late.  We strive to give you quality time with our providers, and arriving late affects you and other patients whose appointments are after yours.  Also, if you no show three or more times for appointments you may be dismissed from the clinic at the providers discretion.     Again, thank you for choosing [facility].  Our hope is that these requests will decrease the amount of time that you wait before being seen by our physicians.       _____________________________________________________________  Should you have questions after your visit to [facility], please contact our office at [phone] and follow the prompts.  Our office hours are 8:00 a.m. and 4:30 p.m. Monday - Friday.  Please note that voicemails left after 4:00 p.m. may not be returned until the following business day.  We are closed weekends and major holidays.  You do have access to a nurse 24-7, just call the main number to the clinic [phone] and do not press any options, hold on the line and a nurse will answer the phone.    For prescription refill requests, have your pharmacy contact our office and allow 72 hours.    Due to Covid, you will  need to wear a mask upon entering the hospital. If you do not have a mask, a mask will be given to you at the Main Entrance upon arrival. For doctor visits, patients may have 1 support person age 82 or older with them. For treatment visits, patients can not have anyone with them due to social distancing guidelines and our immunocompromised population.

## 2021-06-25 NOTE — Progress Notes (Signed)
[place], Daniel [name] [id number]   CLINIC:  Medical Oncology/Hematology  PCP:  Leonie Douglas, MD [address] [place] [address]  [phone]  REASON FOR VISIT:  Follow-up for normocytic anemia  PRIOR THERAPY: Iron tablet twice daily  CURRENT THERAPY: Intermittent IV Iron (Feraheme)  INTERVAL HISTORY:  [name], a 71 y.o. male, returns for routine follow-up for his normocytic anemia. Daniel [name] was last seen on 02/05/2021.  Today he reports feeling good. He denies hematochezia. He is taking 5 ML of iron BID. He spends a majority of the day in the bed, but his caregivers at the [facility] are encouraging him to get out of bed more. He denies constipation.   REVIEW OF SYSTEMS:  Review of Systems  Constitutional:  Negative for appetite change and fatigue.  Cardiovascular:  Positive for palpitations.  Gastrointestinal:  Negative for blood in stool.  Neurological:  Positive for headaches.  All other systems reviewed and are negative.  PAST MEDICAL/SURGICAL HISTORY:  Past Medical History:  Diagnosis Date   Acute kidney failure (HCC)    Acute respiratory failure (HCC)    Adenomatous polyp of stomach    Anemia    Benign prostate hyperplasia    Dementia ([place])    Depression    Diabetes mellitus without complication ([place])    DVT (deep venous thrombosis) ([place])    Gastric ulcer    GERD (gastroesophageal reflux disease)    High cholesterol    Hypertension    Hypo-osmolality and hyponatremia    Insomnia    Neuromuscular dysfunction of bladder    Osteoarthritis    Partial intestinal obstruction (HCC)    Pneumonitis    Protein-calorie malnutrition, moderate ([place])    Stroke ([place])    Supplemental oxygen dependent    24/7   Wheelchair bound    Past Surgical History:  Procedure Laterality Date   BIOPSY  06/21/2020   Procedure: BIOPSY;  Surgeon: Daneil Dolin, MD;  [facility];  Service: Endoscopy;;  gastric    BIOPSY  09/18/2020   Procedure: BIOPSY;  Surgeon: Harvel Quale, MD;  [facility];  Service: Gastroenterology;;   COLONOSCOPY WITH PROPOFOL N/A 12/14/2020   Procedure: COLONOSCOPY WITH PROPOFOL;  Surgeon: Harvel Quale, MD;  [facility];  Service: Gastroenterology;  Laterality: N/A;  8:55   ENDOSCOPIC MUCOSAL RESECTION N/A 11/15/2020   Procedure: ENDOSCOPIC MUCOSAL RESECTION;  Surgeon: Rush Landmark Telford Nab., MD;  Location: [place];  Service: Gastroenterology;  Laterality: N/A;   ESOPHAGOGASTRODUODENOSCOPY (EGD) WITH PROPOFOL N/A 06/21/2020   Procedure: ESOPHAGOGASTRODUODENOSCOPY (EGD) WITH PROPOFOL;  Surgeon: Daneil Dolin, MD;  [facility];  Service: Endoscopy;  Laterality: N/A;   ESOPHAGOGASTRODUODENOSCOPY (EGD) WITH PROPOFOL N/A 09/18/2020   Procedure: ESOPHAGOGASTRODUODENOSCOPY (EGD) WITH PROPOFOL;  Surgeon: Harvel Quale, MD;  [facility];  Service: Gastroenterology;  Laterality: N/A;   ESOPHAGOGASTRODUODENOSCOPY (EGD) WITH PROPOFOL N/A 11/15/2020   Procedure: ESOPHAGOGASTRODUODENOSCOPY (EGD) WITH PROPOFOL;  Surgeon: Rush Landmark Telford Nab., MD;  Location: [place];  Service: Gastroenterology;  Laterality: N/A;   FLEXIBLE SIGMOIDOSCOPY N/A 11/15/2020   Procedure: FLEXIBLE SIGMOIDOSCOPY;  Surgeon: Rush Landmark Telford Nab., MD;  Location: [place];  Service: Gastroenterology;  Laterality: N/A;   HEMOSTASIS CLIP PLACEMENT  11/15/2020   Procedure: HEMOSTASIS CLIP PLACEMENT;  Surgeon: Rush Landmark Telford Nab., MD;  Location: [place];  Service: Gastroenterology;;   HIP SURGERY Left    KIDNEY SURGERY  kidney removed   POLYPECTOMY  11/15/2020   Procedure: POLYPECTOMY;  Surgeon: Mansouraty, Telford Nab., MD;  Location[facility] END[facility]COPY;  Service: Gastroenterology;;   SUBMUCOSAL LIFTING INJECTION  11/15/2020   Procedure: SUBMUCOSAL LIFTING INJECTION;  Surgeon: Irving Copas., MD;  Location: [place];   Service: Gastroenterology;;   SUBMUCOSAL TATTOO INJECTION  11/15/2020   Procedure: SUBMUCOSAL TATTOO INJECTION;  Surgeon: Irving Copas., MD;  Location: [facility] END[facility]COPY;  Service: Gastroenterology;;    SOCIAL HISTORY:  Social History   Socioeconomic History   Marital status: Single    Spouse name: Not on file   Number of children: Not on file   Years of education: Not on file   Highest education level: Not on file  Occupational History   Not on file  Tobacco Use   Smoking status: Never   Smokeless tobacco: Never  Vaping Use   Vaping Use: Never used  Substance and Sexual Activity   Alcohol use: No   Drug use: No   Sexual activity: Never  Other Topics Concern   Not on file  Social History Narrative   Not on file   Social Determinants of Health   Financial Resource Strain: Not on file  Food Insecurity: Not on file  Transportation Needs: Not on file  Physical Activity: Not on file  Stress: Not on file  Social Connections: Not on file  Intimate Partner Violence: Not At Risk   Fear of Current or Ex-Partner: No   Emotionally Abused: No   Physically Abused: No   Sexually Abused: No    FAMILY HISTORY:  Family History  Problem Relation Age of Onset   Hypertension Sister    Hyperlipidemia Sister    Osteoarthritis Sister    Rheum arthritis Sister    Breast cancer Niece    Colon cancer Neg Hx    Esophageal cancer Neg Hx    Inflammatory bowel disease Neg Hx    Liver disease Neg Hx    Pancreatic cancer Neg Hx    Stomach cancer Neg Hx    Rectal cancer Neg Hx     CURRENT MEDICATIONS:  Current Outpatient Medications  Medication Sig Dispense Refill   aMILoride (MIDAMOR) 5 MG tablet Take 1 tablet (5 mg total) by mouth daily. (Patient taking differently: Take 5 mg by mouth in the morning. (1000)) 30 tablet 5   amitriptyline (ELAVIL) 10 MG tablet Take 10 mg by mouth at bedtime. (2100)     amLODipine (NORVASC) 5 MG tablet Take 5 mg by mouth daily.     apixaban  (ELIQUIS) 5 MG TABS tablet Take 1 tablet (5 mg total) by mouth 2 (two) times daily. Start around May 12th 2022  after you finish the initial Eliquis starter pack 60 tablet 2   donepezil (ARICEPT) 10 MG tablet TAKE (1) TABLET BY MOUTH ONCE DAILY. (Patient taking differently: Take 5 mg by mouth daily. (2100)) 30 tablet PRN   dronabinol (MARINOL) 2.5 MG capsule Take 2.5 mg by mouth in the morning and at bedtime. (0800 & 1700)     feeding supplement (ENSURE ENLIVE / ENSURE PLUS) LIQD Take 237 mLs by mouth 2 (two) times daily between meals. 237 mL 12   Ferrous Sulfate 220 (44 Fe) MG/5ML LIQD Take 7.5 mLs by mouth 2 (two) times daily.     folic acid (FOLVITE) 856 MCG tablet Take 800 mcg by mouth every morning. (0800)     insulin glargine (LANTUS) 100 UNIT/ML injection Inject 7 Units into the skin daily.  7 units SQ QHS.     Insulin Lispro (HUMALOG KWIKPEN [place]) Inject into the skin. 7 units Q am     Lactulose 20 GM/30ML SOLN Take 30 mLs (20 g total) by mouth See admin instructions. --Please give lactulose 20 g p.o. x1 if no bowel movement in 48 hours (Patient taking differently: Take 20 g by mouth See admin instructions. Give 20 g by mouth every 48 hours as needed for bowel management related to partial intestinal obstruction) 236 mL 0   magnesium oxide (MAG-OX) 400 MG tablet Take 1 tablet (400 mg total) by mouth 2 (two) times daily. 180 tablet 5   metFORMIN (GLUCOPHAGE) 1000 MG tablet Take 1 tablet (1,000 mg total) by mouth 2 (two) times daily with a meal. (Patient taking differently: Take 1,000 mg by mouth in the morning and at bedtime. (1000 & 1700)) 60 tablet 3   metoCLOPramide (REGLAN) 5 MG tablet Take 1 tablet (5 mg total) by mouth 3 (three) times daily. For stomach 90 tablet 1   omeprazole (PRILOSEC) 20 MG capsule Take 20 mg by mouth 2 (two) times daily.     omeprazole (PRILOSEC) 40 MG capsule Take 40 mg by mouth 2 (two) times daily.     ondansetron (ZOFRAN) 4 MG tablet Take 1 tablet (4 mg total) by  mouth every 6 (six) hours as needed for nausea. 20 tablet 0   polyethylene glycol (MIRALAX) 17 g packet Take 17 g by mouth 2 (two) times daily. For Constipation (Patient taking differently: Take 17 g by mouth 2 (two) times daily. (0800 & 2000) For Constipation) 60 each 2   pyridostigmine (MESTINON) 60 MG tablet Take 1 tablet (60 mg total) by mouth 3 (three) times daily. 270 tablet 3   senna-docusate (SENOKOT-S) 8.6-50 MG tablet Take 2 tablets by mouth at bedtime. (Patient taking differently: Take 2 tablets by mouth 2 (two) times daily.) 60 tablet 3   simethicone (MYLICON) 253 MG chewable tablet Chew 125 mg by mouth every 6 (six) hours as needed for flatulence.     tamsulosin (FLOMAX) 0.4 MG CAPS capsule TAKE 1 CAPSULE BY MOUTH AT BEDTIME. (Patient taking differently: Take 0.4 mg by mouth at bedtime. (2100)) 90 capsule 1   traZODone (DESYREL) 100 MG tablet Take 100 mg by mouth at bedtime. (2100)     vitamin B-12 (CYANOCOBALAMIN) 500 MCG tablet Take 1,000 mcg by mouth daily. (0900)     XARELTO 20 MG TABS tablet Take 20 mg by mouth daily.     atorvastatin (LIPITOR) 40 MG tablet Take 1 tablet (40 mg total) by mouth daily. (Patient taking differently: Take 40 mg by mouth daily at 6 PM.) 30 tablet 4   metoprolol tartrate (LOPRESSOR) 25 MG tablet Take 1 tablet (25 mg total) by mouth 2 (two) times daily. (Patient taking differently: Take 25 mg by mouth 2 (two) times daily. (1000 & 1700)) 60 tablet 3   pantoprazole (PROTONIX) 40 MG tablet Take 1 tablet (40 mg total) by mouth 2 (two) times daily before a meal. 180 tablet 3   sucralfate (CARAFATE) 1 g tablet Take 1 tablet (1 g total) by mouth 4 (four) times daily. (Patient taking differently: Take 1 g by mouth 4 (four) times daily. (0900, 1300, 1700 & 2100)) 120 tablet 1   No current facility-administered medications for this visit.    ALLERGIES:  No Known Allergies  PHYSICAL EXAM:  Performance status (ECOG): 2 - Symptomatic, <50% confined to  bed  Vitals:   06/25/21 1354  BP: (!) 146/90  Pulse: 100  Resp: 18  Temp: 98.9 F (37.2 C)  SpO2: 97%   Wt Readings from Last 3 Encounters:  05/16/21 158 lb 9.6 oz (71.9 kg)  02/05/21 143 lb (64.9 kg)  12/13/20 141 lb (64 kg)   Physical Exam Vitals reviewed.  Constitutional:      Appearance: Normal appearance.     Comments: In wheelchair  Cardiovascular:     Rate and Rhythm: Normal rate and regular rhythm.     Pulses: Normal pulses.     Heart sounds: Normal heart sounds.  Pulmonary:     Effort: Pulmonary effort is normal.     Breath sounds: Normal breath sounds.  Neurological:     General: No focal deficit present.     Mental Status: He is alert and oriented to person, place, and time.  Psychiatric:        Mood and Affect: Mood normal.        Behavior: Behavior normal.    LABORATORY DATA:  I have reviewed the labs as listed.  CBC Latest Ref Rng & Units 06/18/2021 02/05/2021 12/12/2020  WBC 4.0 - 10.5 K/uL 5.9 5.6 5.3  Hemoglobin 13.0 - 17.0 g/dL 12.5(L) 11.2(L) 11.4(L)  Hematocrit 39.0 - 52.0 % 38.8(L) 35.2(L) 35.2(L)  Platelets 150 - 400 K/uL 219 254 367   CMP Latest Ref Rng & Units 06/18/2021 06/18/2021 02/05/2021  Glucose 70 - 99 mg/dL 209(H) - 228(H)  BUN 8 - 23 mg/dL 32(H) - 40(H)  Creatinine 0.61 - 1.24 mg/dL 1.14 - 1.24  Sodium 135 - 145 mmol/L 135 - 132(L)  Potassium 3.5 - 5.1 mmol/L 4.0 - 4.5  Chloride 98 - 111 mmol/L 99 - 95(L)  CO2 22 - 32 mmol/L 26 - 27  Calcium 8.6 - 10.2 mg/dL 10.8(H) 11.3(H) 11.1(H)  Total Protein 6.5 - 8.1 g/dL 7.4 - 7.6  Total Bilirubin 0.3 - 1.2 mg/dL 0.3 - 0.2(L)  Alkaline Phos 38 - 126 U/L 100 - 86  AST 15 - 41 U/L 13(L) - 12(L)  ALT 0 - 44 U/L 14 - 11      Component Value Date/Time   RBC 4.46 06/18/2021 0807   MCV 87.0 06/18/2021 0807   MCH 28.0 06/18/2021 0807   MCHC 32.2 06/18/2021 0807   RDW 15.1 06/18/2021 0807   LYMPHSABS 1.2 06/18/2021 0807   MONOABS 0.5 06/18/2021 0807   EOSABS 0.2 06/18/2021 0807   BASOSABS  0.0 06/18/2021 0807    DIAGNOSTIC IMAGING:  I have independently reviewed the scans and discussed with the patient. No results found.   ASSESSMENT:  1.  Normocytic anemia: - Patient seen at the request of Dr. Jenetta Downer for further management of normocytic anemia. - He had history of GI bleed secondary to severe esophagitis, gastric ulcers. 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 - He is scheduled for colonoscopy in June of this year. - Much of the history provided by patient's legal guardian [name]. - She reports that he had lost 50 pounds since August 2021 and gained some weight back. - CBC on 10/09/2020 done at [facility] in [place] showed hemoglobin 8.5 with MCV 83.7.  Hemoglobin was 7.7 on 09/28/2020. -He is currently taking iron tablet twice daily, J50 and folic acid 093 mcg each.  2.  Social/family history: - He is now a resident at [facility] in [place].  He is seen with his legal guardian, [name]. - No family history of sickle cell or thalassemia.  Niece has breast cancer   PLAN:  1.  Normocytic anemia: - Colonoscopy on 12/28/2020 with no bleeding source. - He is on ferrous sulfate liquid preparation amounting to 325 mg twice daily. - He is tolerating it very well.  No bleeding per rectum or melena reported. - Reviewed his labs today which showed ferritin stable at 34.  However his hemoglobin improved to 12.5 with MCV 87.  Creatinine is 1.14. - Continue current regimen.  RTC 6 months for follow-up.   2.  Mild hypercalcemia: - Calcium level is 10.8. - Previous testing for intact PTH showed 25 consistent with known parathyroid hypercalcemia.  He is on vitamin D 1000 units daily.  Orders placed this encounter:  No orders of the defined  types were placed in this encounter.    Derek Jack, MD [employer] [phone]   I, Thana Ates, am acting as a scribe for Dr. Derek Jack.  I, Derek Jack MD, have reviewed the above documentation for accuracy and completeness, and I agree with the above.

## 2021-12-05 ENCOUNTER — Telehealth (HOSPITAL_COMMUNITY): Payer: Self-pay

## 2021-12-05 NOTE — Telephone Encounter (Signed)
Patient is a resident at [facility]. Daniel [name] called to clarify what labs patient needed drawn on 12-17-2021. CBC/d , Iron, TIBC and Ferritin linked to lab appointment. Reported labs needed to care taker. Rehab center will draw labs for patient there request.

## 2021-12-17 ENCOUNTER — Inpatient Hospital Stay (HOSPITAL_COMMUNITY): Payer: Medicare Other | Attending: Hematology

## 2021-12-24 ENCOUNTER — Inpatient Hospital Stay (HOSPITAL_COMMUNITY): Payer: Medicare Other | Admitting: Hematology

## 2022-01-08 ENCOUNTER — Inpatient Hospital Stay: Payer: Medicare Other | Attending: Hematology | Admitting: Hematology

## 2022-01-08 VITALS — BP 157/91 | HR 80 | Temp 98.3°F | Resp 18

## 2022-01-08 DIAGNOSIS — Z79899 Other long term (current) drug therapy: Secondary | ICD-10-CM | POA: Diagnosis not present

## 2022-01-08 DIAGNOSIS — D649 Anemia, unspecified: Secondary | ICD-10-CM | POA: Insufficient documentation

## 2022-01-08 DIAGNOSIS — D500 Iron deficiency anemia secondary to blood loss (chronic): Secondary | ICD-10-CM

## 2022-01-08 NOTE — Progress Notes (Signed)
Daniel [name],  [id number]   CLINIC:  Medical Oncology/Hematology  PCP:  Leonie Douglas, MD [address] [place] [address]  [phone]  REASON FOR VISIT:  Follow-up for normocytic anemia  PRIOR THERAPY: Iron tablet twice daily  CURRENT THERAPY: Oral iron therapy  INTERVAL HISTORY:  Mr. BRAXDON [name], a 71 y.o. male, returns for routine follow-up for his normocytic anemia. Daniel [name] was last seen on 06/25/2021.  Today he reports feeling good, and he is accompanied by his legal guardian. He denies fatigue. He is wheel chair-bound. His legal guardian reports he has not had recent bleeding.   REVIEW OF SYSTEMS:  Review of Systems  Constitutional:  Negative for appetite change and fatigue.  HENT:   Negative for nosebleeds.   Respiratory:  Negative for hemoptysis.   Gastrointestinal:  Negative for blood in stool.  Genitourinary:  Negative for hematuria.   All other systems reviewed and are negative.   PAST MEDICAL/SURGICAL HISTORY:  Past Medical History:  Diagnosis Date   Acute kidney failure (HCC)    Acute respiratory failure (HCC)    Adenomatous polyp of stomach    Anemia    Benign prostate hyperplasia    Dementia ([place])    Depression    Diabetes mellitus without complication ([place])    DVT (deep venous thrombosis) ([place])    Gastric ulcer    GERD (gastroesophageal reflux disease)    High cholesterol    Hypertension    Hypo-osmolality and hyponatremia    Insomnia    Neuromuscular dysfunction of bladder    Osteoarthritis    Partial intestinal obstruction (HCC)    Pneumonitis    Protein-calorie malnutrition, moderate ([place])    Stroke ([place])    Supplemental oxygen dependent    24/7   Wheelchair bound    Past Surgical History:  Procedure Laterality Date   BIOPSY  06/21/2020   Procedure: BIOPSY;  Surgeon: Daniel Dolin, MD;  [facility];  Service: Endoscopy;;  gastric   BIOPSY  09/18/2020   Procedure:  BIOPSY;  Surgeon: Daniel Quale, MD;  [facility];  Service: Gastroenterology;;   COLONOSCOPY WITH PROPOFOL N/A 12/14/2020   Procedure: COLONOSCOPY WITH PROPOFOL;  Surgeon: Daniel Quale, MD;  [facility];  Service: Gastroenterology;  Laterality: N/A;  8:55   ENDOSCOPIC MUCOSAL RESECTION N/A 11/15/2020   Procedure: ENDOSCOPIC MUCOSAL RESECTION;  Surgeon: Daniel Landmark Telford Nab., MD;  Location: [facility];  Service: Gastroenterology;  Laterality: N/A;   ESOPHAGOGASTRODUODENOSCOPY (EGD) WITH PROPOFOL N/A 06/21/2020   Procedure: ESOPHAGOGASTRODUODENOSCOPY (EGD) WITH PROPOFOL;  Surgeon: Daniel Dolin, MD;  [facility];  Service: Endoscopy;  Laterality: N/A;   ESOPHAGOGASTRODUODENOSCOPY (EGD) WITH PROPOFOL N/A 09/18/2020   Procedure: ESOPHAGOGASTRODUODENOSCOPY (EGD) WITH PROPOFOL;  Surgeon: Daniel Quale, MD;  [facility];  Service: Gastroenterology;  Laterality: N/A;   ESOPHAGOGASTRODUODENOSCOPY (EGD) WITH PROPOFOL N/A 11/15/2020   Procedure: ESOPHAGOGASTRODUODENOSCOPY (EGD) WITH PROPOFOL;  Surgeon: Daniel Landmark Telford Nab., MD;  Location: [facility] [facility];  Service: Gastroenterology;  Laterality: N/A;   FLEXIBLE SIGMOIDOSCOPY N/A 11/15/2020   Procedure: FLEXIBLE SIGMOIDOSCOPY;  Surgeon: Daniel Landmark Telford Nab., MD;  Location: [facility];  Service: Gastroenterology;  Laterality: N/A;   HEMOSTASIS CLIP PLACEMENT  11/15/2020   Procedure: HEMOSTASIS CLIP PLACEMENT;  Surgeon: Daniel Landmark Telford Nab., MD;  Location: [facility];  Service: Gastroenterology;;   HIP SURGERY Left    KIDNEY SURGERY     kidney removed  POLYPECTOMY  11/15/2020   Procedure: POLYPECTOMY;  Surgeon: Daniel [name], Telford Nab., MD;  Location: [facility];  Service: Gastroenterology;;   SUBMUCOSAL LIFTING INJECTION  11/15/2020   Procedure: SUBMUCOSAL LIFTING INJECTION;  Surgeon: Daniel [name]., MD;  Location: [place];  Service: Gastroenterology;;    SUBMUCOSAL TATTOO INJECTION  11/15/2020   Procedure: SUBMUCOSAL TATTOO INJECTION;  Surgeon: Daniel [name]., MD;  Location: [facility] END[facility];  Service: Gastroenterology;;    SOCIAL HISTORY:  Social History   Socioeconomic History   Marital status: Single    Spouse name: Not on file   Number of children: Not on file   Years of education: Not on file   Highest education level: Not on file  Occupational History   Not on file  Tobacco Use   Smoking status: Never   Smokeless tobacco: Never  Vaping Use   Vaping Use: Never used  Substance and Sexual Activity   Alcohol use: No   Drug use: No   Sexual activity: Never  Other Topics Concern   Not on file  Social History Narrative   Not on file   Social Determinants of Health   Financial Resource Strain: Not on file  Food Insecurity: Not on file  Transportation Needs: Not on file  Physical Activity: Not on file  Stress: Not on file  Social Connections: Not on file  Intimate Partner Violence: Not At Risk (10/11/2020)   Humiliation, Afraid, Rape, and Kick questionnaire    Fear of Current or Ex-Partner: No    Emotionally Abused: No    Physically Abused: No    Sexually Abused: No    FAMILY HISTORY:  Family History  Problem Relation Age of Onset   Hypertension Sister    Hyperlipidemia Sister    Osteoarthritis Sister    Rheum arthritis Sister    Breast cancer Niece    Colon cancer Neg Hx    Esophageal cancer Neg Hx    Inflammatory bowel disease Neg Hx    Liver disease Neg Hx    Pancreatic cancer Neg Hx    Stomach cancer Neg Hx    Rectal cancer Neg Hx     CURRENT MEDICATIONS:  Current Outpatient Medications  Medication Sig Dispense Refill   atorvastatin (LIPITOR) 20 MG tablet Take 20 mg by mouth daily.     cholecalciferol (VITAMIN D3) 25 MCG (1000 UNIT) tablet Take 1,000 Units by mouth daily.     Ferrous Sulfate 220 (44 Fe) MG/5ML LIQD Take 7.5 mLs by mouth 2 (two) times daily.     folic acid (FOLVITE) 462 MCG tablet  Take 800 mcg by mouth every morning. (0800)     insulin glargine (LANTUS) 100 UNIT/ML injection Inject 7 Units into the skin daily. 7 units SQ QHS.     Insulin Lispro (HUMALOG KWIKPEN [place]) Inject into the skin. 7 units Q am     magnesium oxide (MAG-OX) 400 MG tablet Take 1 tablet (400 mg total) by mouth 2 (two) times daily. 180 tablet 5   metFORMIN (GLUCOPHAGE) 1000 MG tablet Take 1 tablet (1,000 mg total) by mouth 2 (two) times daily with a meal. (Patient taking differently: Take 1,000 mg by mouth in the morning and at bedtime. (1000 & 1700)) 60 tablet 3   omeprazole (PRILOSEC) 20 MG capsule Take 20 mg by mouth 2 (two) times daily.     ondansetron (ZOFRAN) 4 MG tablet Take 1 tablet (4 mg total) by mouth every 6 (six) hours as needed for nausea. 20 tablet 0  polyethylene glycol (MIRALAX) 17 g packet Take 17 g by mouth 2 (two) times daily. For Constipation (Patient taking differently: Take 17 g by mouth 2 (two) times daily. (0800 & 2000) For Constipation) 60 each 2   pyridostigmine (MESTINON) 60 MG tablet Take 1 tablet (60 mg total) by mouth 3 (three) times daily. 270 tablet 3   simethicone (MYLICON) 174 MG chewable tablet Chew 125 mg by mouth every 6 (six) hours as needed for flatulence.     tamsulosin (FLOMAX) 0.4 MG CAPS capsule TAKE 1 CAPSULE BY MOUTH AT BEDTIME. (Patient taking differently: Take 0.4 mg by mouth at bedtime. (2100)) 90 capsule 1   traZODone (DESYREL) 100 MG tablet Take 100 mg by mouth at bedtime. (0814)     TRULICITY 4.81 EH/6.3JS SOPN Inject into the skin.     vitamin B-12 (CYANOCOBALAMIN) 500 MCG tablet Take 1,000 mcg by mouth daily. (0900)     XARELTO 20 MG TABS tablet Take 20 mg by mouth daily.     metoCLOPramide (REGLAN) 5 MG tablet Take 1 tablet (5 mg total) by mouth 3 (three) times daily. For stomach 90 tablet 1   metoprolol tartrate (LOPRESSOR) 25 MG tablet Take 1 tablet (25 mg total) by mouth 2 (two) times daily. (Patient taking differently: Take 25 mg by mouth 2 (two)  times daily. (1000 & 1700)) 60 tablet 3   pantoprazole (PROTONIX) 40 MG tablet Take 1 tablet (40 mg total) by mouth 2 (two) times daily before a meal. 180 tablet 3   sucralfate (CARAFATE) 1 g tablet Take 1 tablet (1 g total) by mouth 4 (four) times daily. (Patient taking differently: Take 1 g by mouth 4 (four) times daily. (0900, 1300, 1700 & 2100)) 120 tablet 1   No current facility-administered medications for this visit.    ALLERGIES:  No Known Allergies  PHYSICAL EXAM:  Performance status (ECOG): 2 - Symptomatic, <50% confined to bed  There were no vitals filed for this visit. Wt Readings from Last 3 Encounters:  05/16/21 158 lb 9.6 oz (71.9 kg)  02/05/21 143 lb (64.9 kg)  12/13/20 141 lb (64 kg)   Physical Exam Vitals reviewed.  Constitutional:      Appearance: Normal appearance.     Comments: In wheelchair  Cardiovascular:     Rate and Rhythm: Normal rate and regular rhythm.     Pulses: Normal pulses.     Heart sounds: Normal heart sounds.  Pulmonary:     Effort: Pulmonary effort is normal.     Breath sounds: Normal breath sounds.  Neurological:     General: No focal deficit present.     Mental Status: He is alert and oriented to person, place, and time.  Psychiatric:        Mood and Affect: Mood normal.        Behavior: Behavior normal.     LABORATORY DATA:  I have reviewed the labs as listed.     Latest Ref Rng & Units 06/18/2021    8:07 AM 02/05/2021    3:21 PM 12/12/2020   10:02 AM  CBC  WBC 4.0 - 10.5 K/uL 5.9  5.6  5.3   Hemoglobin 13.0 - 17.0 g/dL 12.5  11.2  11.4   Hematocrit 39.0 - 52.0 % 38.8  35.2  35.2   Platelets 150 - 400 K/uL 219  254  367       Latest Ref Rng & Units 06/18/2021    8:07 AM 02/05/2021    3:21  PM 12/12/2020   10:02 AM  CMP  Glucose 70 - 99 mg/dL 209  228  249   BUN 8 - 23 mg/dL 32  40  40   Creatinine 0.61 - 1.24 mg/dL 1.14  1.24  1.04   Sodium 135 - 145 mmol/L 135  132  135   Potassium 3.5 - 5.1 mmol/L 4.0  4.5  4.4    Chloride 98 - 111 mmol/L 99  95  96   CO2 22 - 32 mmol/L '26  27  27   '$ Calcium 8.6 - 10.2 mg/dL 8.9 - 10.3 mg/dL 11.3    10.8  11.1    11.2  11.4   Total Protein 6.5 - 8.1 g/dL 7.4  7.6    Total Bilirubin 0.3 - 1.2 mg/dL 0.3  0.2    Alkaline Phos 38 - 126 U/L 100  86    AST 15 - 41 U/L 13  12    ALT 0 - 44 U/L 14  11        Component Value Date/Time   RBC 4.46 06/18/2021 0807   MCV 87.0 06/18/2021 0807   MCH 28.0 06/18/2021 0807   MCHC 32.2 06/18/2021 0807   RDW 15.1 06/18/2021 0807   LYMPHSABS 1.2 06/18/2021 0807   MONOABS 0.5 06/18/2021 0807   EOSABS 0.2 06/18/2021 0807   BASOSABS 0.0 06/18/2021 0807    DIAGNOSTIC IMAGING:  I have independently reviewed the scans and discussed with the patient. No results found.   ASSESSMENT:  1.  Normocytic anemia: - Patient seen at the request of Dr. Jenetta Downer for further management of normocytic anemia. - He had history of GI bleed secondary to severe esophagitis, gastric ulcers. 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 - He is scheduled for colonoscopy in June of this year. - Much of the history provided by patient's legal guardian [name]. - She reports that he had lost 50 pounds since August 2021 and gained some weight back. - CBC on 10/09/2020 done at [facility] in [place] showed hemoglobin 8.5 with MCV 83.7.  Hemoglobin was 7.7 on 09/28/2020. -He is currently taking iron tablet twice daily, D17 and folic acid 616 mcg each.   2.  Social/family history: - He is now a resident at [facility] in [place].  He is seen with his legal guardian, [name]. - No family history of sickle cell or thalassemia.  Niece has breast cancer   PLAN:  1.  Normocytic anemia: - He is taking iron liquid preparation  equivalent 325 mg twice daily without any problems. - No bleeding or melena reported. - Last EGD and colonoscopy in 2022 were unrevealing. - Reviewed labs from rehab center in [place].  CBC showed hemoglobin decreased to 10.7, MCV 87.  White count and platelet count was normal.  Ferritin was 56 and percent saturation was 34. - Hemoglobin dropped by 2 points since last visit 6 months ago. - Recommend continuing oral iron therapy twice daily.  I plan to recheck his CBC, ferritin and iron panel in 4 months.  If there is drop in hemoglobin to less than 10, will consider parenteral iron therapy.   2.  Mild hypercalcemia: - Previous testing showed mild hypercalcemia with PTH 25, consistent with normal parathyroid hypercalcemia.  He is  also on vitamin D.  We will obtain calcium level at next visit.  Orders placed this encounter:  No orders of the defined types were placed in this encounter.    Derek Jack, MD [place] [phone]   I, Thana Ates, am acting as a scribe for Dr. Derek Jack.  I, Derek Jack MD, have reviewed the above documentation for accuracy and completeness, and I agree with the above.

## 2022-01-08 NOTE — Patient Instructions (Signed)
[place] at [facility] Discharge Instructions  You were seen and examined today by Dr. Delton Coombes.  Dr. Delton Coombes discussed your most recent lab work and your iron levels are stable, your hemoglobin has dropped since last visit. Continue taking ferrous sulfate twice daily. If your hemoglobin continues to drop we will need to set you up to have IV iron at that time.  Follow-up as scheduled in 4 months.    Thank you for choosing [place] at [facility] to provide your oncology and hematology care.  To afford each patient quality time with our provider, please arrive at least 15 minutes before your scheduled appointment time.   If you have a lab appointment with the [facility] please come in thru the Main Entrance and check in at the main information desk.  You need to re-schedule your appointment should you arrive 10 or more minutes late.  We strive to give you quality time with our providers, and arriving late affects you and other patients whose appointments are after yours.  Also, if you no show three or more times for appointments you may be dismissed from the clinic at the providers discretion.     Again, thank you for choosing [facility].  Our hope is that these requests will decrease the amount of time that you wait before being seen by our physicians.       _____________________________________________________________  Should you have questions after your visit to [facility], please contact our office at [phone] and follow the prompts.  Our office hours are 8:00 a.m. and 4:30 p.m. Monday - Friday.  Please note that voicemails left after 4:00 p.m. may not be returned until the following business day.  We are closed weekends and major holidays.  You do have access to a nurse 24-7, just call the main number to the clinic [phone] and do not press any options, hold on the line and a nurse will answer  the phone.    For prescription refill requests, have your pharmacy contact our office and allow 72 hours.

## 2022-05-12 ENCOUNTER — Inpatient Hospital Stay: Payer: Medicare Other

## 2022-05-14 ENCOUNTER — Other Ambulatory Visit: Payer: Self-pay

## 2022-05-14 DIAGNOSIS — D649 Anemia, unspecified: Secondary | ICD-10-CM

## 2022-05-14 DIAGNOSIS — D500 Iron deficiency anemia secondary to blood loss (chronic): Secondary | ICD-10-CM

## 2022-05-16 NOTE — Progress Notes (Unsigned)
Daniel [name], [place] [address]   CLINIC:  Medical Oncology/Hematology  PCP:  Leonie Douglas, MD [address] [place] [address] [phone]   REASON FOR VISIT:  Follow-up for normocytic anemia  PRIOR THERAPY: Iron tablet twice daily  CURRENT THERAPY: Oral iron therapy  INTERVAL HISTORY:  Daniel [name] 71 y.o. male returns for routine follow-up of his normocytic anemia.  He was last seen by Dr. Delton Coombes on 06/25/2021.  Patient presents today with his legal guardian.  ***  At today's visit, he reports feeling ***.  No recent hospitalizations, surgeries, or changes in baseline health status.  Patient's legal guardian reports that he has not had any recent bleeding.  *** *** Overall, no changes in his overall energy levels or health status.  He has ***% energy and ***% appetite. He endorses that he is maintaining a stable weight.   REVIEW OF SYSTEMS:*** Review of Systems - Oncology    PAST MEDICAL/SURGICAL HISTORY:  Past Medical History:  Diagnosis Date   Acute kidney failure (HCC)    Acute respiratory failure (HCC)    Adenomatous polyp of stomach    Anemia    Benign prostate hyperplasia    Dementia ([place])    Depression    Diabetes mellitus without complication ([place])    DVT (deep venous thrombosis) (HCC)    Gastric ulcer    GERD (gastroesophageal reflux disease)    High cholesterol    Hypertension    Hypo-osmolality and hyponatremia    Insomnia    Neuromuscular dysfunction of bladder    Osteoarthritis    Partial intestinal obstruction (HCC)    Pneumonitis    Protein-calorie malnutrition, moderate ([place])    Stroke ([place])    Supplemental oxygen dependent    24/7   Wheelchair bound    Past Surgical History:  Procedure Laterality Date   BIOPSY  06/21/2020   Procedure: BIOPSY;  Surgeon: Daneil Dolin, MD;  [facility];  Service: Endoscopy;;  gastric   BIOPSY  09/18/2020   Procedure: BIOPSY;  Surgeon:  Harvel Quale, MD;  [facility];  Service: Gastroenterology;;   COLONOSCOPY WITH PROPOFOL N/A 12/14/2020   Procedure: COLONOSCOPY WITH PROPOFOL;  Surgeon: Harvel Quale, MD;  [facility];  Service: Gastroenterology;  Laterality: N/A;  8:55   ENDOSCOPIC MUCOSAL RESECTION N/A 11/15/2020   Procedure: ENDOSCOPIC MUCOSAL RESECTION;  Surgeon: Rush Landmark Telford Nab., MD;  Location: [place] [facility];  Service: Gastroenterology;  Laterality: N/A;   ESOPHAGOGASTRODUODENOSCOPY (EGD) WITH PROPOFOL N/A 06/21/2020   Procedure: ESOPHAGOGASTRODUODENOSCOPY (EGD) WITH PROPOFOL;  Surgeon: Daneil Dolin, MD;  [facility];  Service: Endoscopy;  Laterality: N/A;   ESOPHAGOGASTRODUODENOSCOPY (EGD) WITH PROPOFOL N/A 09/18/2020   Procedure: ESOPHAGOGASTRODUODENOSCOPY (EGD) WITH PROPOFOL;  Surgeon: Harvel Quale, MD;  [facility];  Service: Gastroenterology;  Laterality: N/A;   ESOPHAGOGASTRODUODENOSCOPY (EGD) WITH PROPOFOL N/A 11/15/2020   Procedure: ESOPHAGOGASTRODUODENOSCOPY (EGD) WITH PROPOFOL;  Surgeon: Rush Landmark Telford Nab., MD;  Location: [facility];  Service: Gastroenterology;  Laterality: N/A;   FLEXIBLE SIGMOIDOSCOPY N/A 11/15/2020   Procedure: FLEXIBLE SIGMOIDOSCOPY;  Surgeon: Rush Landmark Telford Nab., MD;  Location: [place];  Service: Gastroenterology;  Laterality: N/A;   HEMOSTASIS CLIP PLACEMENT  11/15/2020   Procedure: HEMOSTASIS CLIP PLACEMENT;  Surgeon: Rush Landmark Telford Nab., MD;  Location: [facility];  Service: Gastroenterology;;   HIP SURGERY Left    KIDNEY SURGERY     kidney removed   POLYPECTOMY  11/15/2020   Procedure: POLYPECTOMY;  Surgeon: Mansouraty, Telford Nab., MD;  Location: [facility];  Service: Gastroenterology;;   SUBMUCOSAL LIFTING INJECTION  11/15/2020   Procedure: SUBMUCOSAL LIFTING INJECTION;  Surgeon: Irving Copas., MD;  Location: [place];  Service: Gastroenterology;;   SUBMUCOSAL TATTOO  INJECTION  11/15/2020   Procedure: SUBMUCOSAL TATTOO INJECTION;  Surgeon: Irving Copas., MD;  [facility];  Service: Gastroenterology;;     SOCIAL HISTORY:  Social History   Socioeconomic History   Marital status: Single    Spouse name: Not on file   Number of children: Not on file   Years of education: Not on file   Highest education level: Not on file  Occupational History   Not on file  Tobacco Use   Smoking status: Never   Smokeless tobacco: Never  Vaping Use   Vaping Use: Never used  Substance and Sexual Activity   Alcohol use: No   Drug use: No   Sexual activity: Never  Other Topics Concern   Not on file  Social History Narrative   Not on file   Social Determinants of Health   Financial Resource Strain: Not on file  Food Insecurity: Not on file  Transportation Needs: Not on file  Physical Activity: Not on file  Stress: Not on file  Social Connections: Not on file  Intimate Partner Violence: Not At Risk (10/11/2020)   Humiliation, Afraid, Rape, and Kick questionnaire    Fear of Current or Ex-Partner: No    Emotionally Abused: No    Physically Abused: No    Sexually Abused: No    FAMILY HISTORY:  Family History  Problem Relation Age of Onset   Hypertension Sister    Hyperlipidemia Sister    Osteoarthritis Sister    Rheum arthritis Sister    Breast cancer Niece    Colon cancer Neg Hx    Esophageal cancer Neg Hx    Inflammatory bowel disease Neg Hx    Liver disease Neg Hx    Pancreatic cancer Neg Hx    Stomach cancer Neg Hx    Rectal cancer Neg Hx     CURRENT MEDICATIONS:  Outpatient Encounter Medications as of 05/19/2022  Medication Sig Note   atorvastatin (LIPITOR) 20 MG tablet Take 20 mg by mouth daily.    cholecalciferol (VITAMIN D3) 25 MCG (1000 UNIT) tablet Take 1,000 Units by mouth daily.    Ferrous Sulfate 220 (44 Fe) MG/5ML LIQD Take 7.5 mLs by mouth 2 (two) times daily.    folic acid (FOLVITE) 578 MCG tablet Take 800  mcg by mouth every morning. (0800)    insulin glargine (LANTUS) 100 UNIT/ML injection Inject 7 Units into the skin daily. 7 units SQ QHS.    Insulin Lispro (HUMALOG KWIKPEN [place]) Inject into the skin. 7 units Q am    magnesium oxide (MAG-OX) 400 MG tablet Take 1 tablet (400 mg total) by mouth 2 (two) times daily.    metFORMIN (GLUCOPHAGE) 1000 MG tablet Take 1 tablet (1,000 mg total) by mouth 2 (two) times daily with a meal. (Patient taking differently: Take 1,000 mg by mouth in the morning and at bedtime. (1000 & 1700))    metoCLOPramide (REGLAN) 5 MG tablet Take 1 tablet (5 mg total) by mouth 3 (three) times daily. For stomach    metoprolol tartrate (LOPRESSOR) 25 MG tablet Take 1 tablet (25 mg total) by mouth 2 (two) times daily. (Patient taking differently: Take 25 mg by mouth 2 (two) times  daily. (1000 & 1700))    omeprazole (PRILOSEC) 20 MG capsule Take 20 mg by mouth 2 (two) times daily.    ondansetron (ZOFRAN) 4 MG tablet Take 1 tablet (4 mg total) by mouth every 6 (six) hours as needed for nausea.    pantoprazole (PROTONIX) 40 MG tablet Take 1 tablet (40 mg total) by mouth 2 (two) times daily before a meal. 12/05/2020: (0800 & 2100)   polyethylene glycol (MIRALAX) 17 g packet Take 17 g by mouth 2 (two) times daily. For Constipation (Patient taking differently: Take 17 g by mouth 2 (two) times daily. (0800 & 2000) For Constipation)    pyridostigmine (MESTINON) 60 MG tablet Take 1 tablet (60 mg total) by mouth 3 (three) times daily.    simethicone (MYLICON) 194 MG chewable tablet Chew 125 mg by mouth every 6 (six) hours as needed for flatulence.    sucralfate (CARAFATE) 1 g tablet Take 1 tablet (1 g total) by mouth 4 (four) times daily. (Patient taking differently: Take 1 g by mouth 4 (four) times daily. (0900, 1300, 1700 & 2100))    tamsulosin (FLOMAX) 0.4 MG CAPS capsule TAKE 1 CAPSULE BY MOUTH AT BEDTIME. (Patient taking differently: Take 0.4 mg by mouth at bedtime. (2100))    traZODone  (DESYREL) 100 MG tablet Take 100 mg by mouth at bedtime. (1740)    TRULICITY 8.14 GY/1.8HU SOPN Inject into the skin.    vitamin B-12 (CYANOCOBALAMIN) 500 MCG tablet Take 1,000 mcg by mouth daily. (0900)    XARELTO 20 MG TABS tablet Take 20 mg by mouth daily.    [DISCONTINUED] ramipril (ALTACE) 10 MG capsule Take 2 capsules (20 mg total) by mouth daily.    No facility-administered encounter medications on file as of 05/19/2022.    ALLERGIES:  No Known Allergies   PHYSICAL EXAM:*** ECOG PERFORMANCE STATUS: {CHL ONC ECOG [phone]}  There were no vitals filed for this visit. There were no vitals filed for this visit. Physical Exam   LABORATORY DATA:  I have reviewed the labs as listed.  CBC    Component Value Date/Time   WBC 5.9 06/18/2021 0807   RBC 4.46 06/18/2021 0807   HGB 12.5 (L) 06/18/2021 0807   HCT 38.8 (L) 06/18/2021 0807   PLT 219 06/18/2021 0807   MCV 87.0 06/18/2021 0807   MCH 28.0 06/18/2021 0807   MCHC 32.2 06/18/2021 0807   RDW 15.1 06/18/2021 0807   LYMPHSABS 1.2 06/18/2021 0807   MONOABS 0.5 06/18/2021 0807   EOSABS 0.2 06/18/2021 0807   BASOSABS 0.0 06/18/2021 0807      Latest Ref Rng & Units 06/18/2021    8:07 AM 02/05/2021    3:21 PM 12/12/2020   10:02 AM  CMP  Glucose 70 - 99 mg/dL 209  228  249   BUN 8 - 23 mg/dL 32  40  40   Creatinine 0.61 - 1.24 mg/dL 1.14  1.24  1.04   Sodium 135 - 145 mmol/L 135  132  135   Potassium 3.5 - 5.1 mmol/L 4.0  4.5  4.4   Chloride 98 - 111 mmol/L 99  95  96   CO2 22 - 32 mmol/L '26  27  27   '$ Calcium 8.6 - 10.2 mg/dL 8.9 - 10.3 mg/dL 11.3    10.8  11.1    11.2  11.4   Total Protein 6.5 - 8.1 g/dL 7.4  7.6    Total Bilirubin 0.3 - 1.2 mg/dL 0.3  0.2  Alkaline Phos 38 - 126 U/L 100  86    AST 15 - 41 U/L 13  12    ALT 0 - 44 U/L 14  11      DIAGNOSTIC IMAGING:  I have independently reviewed the relevant imaging and discussed with the patient.  ASSESSMENT & PLAN: 1.  Normocytic anemia: - Patient  seen at the request of Dr. Jenetta Downer for further management of normocytic anemia. - He had history of GI bleed secondary to severe esophagitis, gastric ulcers. 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 - Most recent EGD (11/15/2020): Moderate gastritis, scars from healed ulcers in gastric body.  Duodenal polyp x 2. - Colonoscopy (11/15/2020): Nonbleeding hemorrhoids, polyp x 1.  Poor colon prep. - Additional hematology workup (10/11/2020): Normal LDH.  SPEP negative.  Elevated light chains in keeping with CKD.  Normal B12/MMA.  Normal copper. - Patient currently taking liquid iron preparation equivalent to 325 mg twice daily.  *** - Taking C14 and folic acid 481 mcg each.  *** - No bleeding or melena reported.  *** - Most recent [facility] (05/07/2022): Hgb 10.7/MCV 85.4 Ferritin 41.2, iron saturation 20%, TIBC 242 Creatinine 1.55/GFR 48 (CKD stage IIIa) - DIFFERENTIAL DIAGNOSIS: Normocytic anemia secondary to chronic GI blood loss, iron deficiency, chronic disease, and CKD stage IIIa - PLAN: Continue oral iron therapy twice daily. - Will recheck CBC, ferritin, iron panel, CMP in 4 months. - If there is drop in hemoglobin to less than 10, will consider IV iron therapy and/or ESA.    2.  Weight loss - Patient's guardian reports that he has had 50 pound weight loss since August 2021, but has regained some of that weight back - No malignancy on EGD/colonoscopies performed in 2022 - CT chest and CT abdomen/pelvis from April 2022 were negative for malignancy - *** Appetite and oral intake *** - Weight today *** - PLAN: ***    3.   Mild hypercalcemia: - Previous testing showed mild hypercalcemia with PTH 25, consistent with normal  parathyroid hypercalcemia.  He is also on vitamin D. - SPEP checked in May 2022 was normal. - Most recent labs (05/07/2022) show mildly elevated calcium 11.2 *** - PLAN: Will check 24-hour urine calcium and creatinine *** (if able) *** + intact PTH w/ serum calcium + PTH related peptide + 25-hydroxy vitamin D + 1,25-dihydroxy vitamin + SPEP + immunofixation + free light chains   4.  Social/family history: - He is now a resident at [facility] in [place].  He is seen with his legal guardian, [name].*** - No family history of sickle cell or thalassemia.  Niece has breast cancer - Other PMH: Type 2 diabetes mellitus, hypertension, heart failure, gastric ulcer with bleeding, unspecified dementia, history of stroke, history of DVT (on Xarelto)   PLAN SUMMARY:*** >> Labs today (PTH related peptide, PTH intact, serum calcium, 25-hydroxy vitamin D, 1,25-dihydroxy vitamin D) >> 24-hour urine calcium and creatinine, if able to provide sample *** >> Labs in 4 months (CBC/D, ferritin, iron TIBC, CMP) via [facility] in [place] >> Office visit in 4 months, 1 week after labs   All questions were answered. The patient knows to call the clinic with any problems, questions or concerns.  Medical decision making: ***  Time spent on visit: I spent *** minutes counseling the patient face to face. The total time spent in the appointment was *** minutes and more than 50% was on counseling.   Harriett Rush, PA-C  ***

## 2022-05-19 ENCOUNTER — Inpatient Hospital Stay: Payer: Medicare Other | Attending: Physician Assistant | Admitting: Physician Assistant

## 2022-05-19 ENCOUNTER — Inpatient Hospital Stay: Payer: Medicare Other

## 2022-05-19 ENCOUNTER — Other Ambulatory Visit: Payer: Self-pay

## 2022-05-19 VITALS — BP 143/87 | HR 85 | Temp 98.2°F | Resp 17

## 2022-05-19 DIAGNOSIS — E440 Moderate protein-calorie malnutrition: Secondary | ICD-10-CM

## 2022-05-19 DIAGNOSIS — D500 Iron deficiency anemia secondary to blood loss (chronic): Secondary | ICD-10-CM

## 2022-05-19 DIAGNOSIS — Z79899 Other long term (current) drug therapy: Secondary | ICD-10-CM | POA: Insufficient documentation

## 2022-05-19 DIAGNOSIS — Z7901 Long term (current) use of anticoagulants: Secondary | ICD-10-CM | POA: Diagnosis not present

## 2022-05-19 DIAGNOSIS — D631 Anemia in chronic kidney disease: Secondary | ICD-10-CM

## 2022-05-19 DIAGNOSIS — N1831 Chronic kidney disease, stage 3a: Secondary | ICD-10-CM | POA: Diagnosis not present

## 2022-05-19 DIAGNOSIS — D649 Anemia, unspecified: Secondary | ICD-10-CM | POA: Insufficient documentation

## 2022-05-19 DIAGNOSIS — Z794 Long term (current) use of insulin: Secondary | ICD-10-CM | POA: Insufficient documentation

## 2022-05-19 DIAGNOSIS — I129 Hypertensive chronic kidney disease with stage 1 through stage 4 chronic kidney disease, or unspecified chronic kidney disease: Secondary | ICD-10-CM | POA: Insufficient documentation

## 2022-05-19 DIAGNOSIS — E1122 Type 2 diabetes mellitus with diabetic chronic kidney disease: Secondary | ICD-10-CM | POA: Diagnosis not present

## 2022-05-19 DIAGNOSIS — Z8673 Personal history of transient ischemic attack (TIA), and cerebral infarction without residual deficits: Secondary | ICD-10-CM | POA: Insufficient documentation

## 2022-05-19 DIAGNOSIS — Z86718 Personal history of other venous thrombosis and embolism: Secondary | ICD-10-CM | POA: Diagnosis not present

## 2022-05-19 DIAGNOSIS — Z7984 Long term (current) use of oral hypoglycemic drugs: Secondary | ICD-10-CM | POA: Diagnosis not present

## 2022-05-19 DIAGNOSIS — E538 Deficiency of other specified B group vitamins: Secondary | ICD-10-CM | POA: Insufficient documentation

## 2022-05-19 DIAGNOSIS — N183 Chronic kidney disease, stage 3 unspecified: Secondary | ICD-10-CM | POA: Diagnosis not present

## 2022-05-19 LAB — VITAMIN D 25 HYDROXY (VIT D DEFICIENCY, FRACTURES): Vit D, 25-Hydroxy: 55.24 ng/mL (ref 30–100)

## 2022-05-19 NOTE — Patient Instructions (Signed)
[facility] **   You were seen today by Tarri Abernethy PA-C for your anemia.    ANEMIA Your anemia is due to your chronic kidney disease and iron deficiency. Your iron levels look great.  Continue to take iron supplement daily. You do not need any IV iron. Will check your labs again in 4 months.  ELEVATED CALCIUM We will check labs today. We also need a 24-hour urine collection, which can be brought back to the lab once completed.  LABS: Labs today and repeat labs in 4 months  FOLLOW-UP APPOINTMENT: Office visit in 4 months, after labs have been completed  ** Thank you for trusting me with your healthcare!  I strive to provide all of my patients with quality care at each visit.  If you receive a survey for this visit, I would be so grateful to you for taking the time to provide feedback.  Thank you in advance!  ~ Ameli Sangiovanni                   Dr. Derek Jack   &   Tarri Abernethy, PA-C   - - - - - - - - - - - - - - - - - -    Thank you for choosing [place] at [facility] to provide your oncology and hematology care.  To afford each patient quality time with our provider, please arrive at least 15 minutes before your scheduled appointment time.   If you have a lab appointment with the [facility] please come in thru the Main Entrance and check in at the main information desk.  You need to re-schedule your appointment should you arrive 10 or more minutes late.  We strive to give you quality time with our providers, and arriving late affects you and other patients whose appointments are after yours.  Also, if you no show three or more times for appointments you may be dismissed from the clinic at the providers discretion.     Again, thank you for choosing [facility].  Our hope is that these requests will decrease the amount of time that you wait before being  seen by our physicians.       _____________________________________________________________  Should you have questions after your visit to [facility], please contact our office at [phone] and follow the prompts.  Our office hours are 8:00 a.m. and 4:30 p.m. Monday - Friday.  Please note that voicemails left after 4:00 p.m. may not be returned until the following business day.  We are closed weekends and major holidays.  You do have access to a nurse 24-7, just call the main number to the clinic [phone] and do not press any options, hold on the line and a nurse will answer the phone.    For prescription refill requests, have your pharmacy contact our office and allow 72 hours.

## 2022-05-20 DIAGNOSIS — D649 Anemia, unspecified: Secondary | ICD-10-CM | POA: Diagnosis not present

## 2022-05-20 LAB — PTH, INTACT AND CALCIUM
Calcium, Total (PTH): 11.9 mg/dL — ABNORMAL HIGH (ref 8.6–10.2)
PTH: 20 pg/mL (ref 15–65)

## 2022-05-21 ENCOUNTER — Other Ambulatory Visit: Payer: Self-pay

## 2022-05-21 DIAGNOSIS — N183 Chronic kidney disease, stage 3 unspecified: Secondary | ICD-10-CM

## 2022-05-21 DIAGNOSIS — E440 Moderate protein-calorie malnutrition: Secondary | ICD-10-CM

## 2022-05-21 DIAGNOSIS — D649 Anemia, unspecified: Secondary | ICD-10-CM | POA: Diagnosis not present

## 2022-05-21 DIAGNOSIS — D500 Iron deficiency anemia secondary to blood loss (chronic): Secondary | ICD-10-CM

## 2022-05-21 LAB — CREATININE, URINE, 24 HOUR
Collection Interval-UCRE24: 24 hours
Creatinine, 24H Ur: 664 mg/d — ABNORMAL LOW (ref 800–2000)
Creatinine, Urine: 60.35 mg/dL
Urine Total Volume-UCRE24: 1100 mL

## 2022-05-23 LAB — CALCIUM, URINE, 24 HOUR
Calcium, 24 hour urine: 167 mg/24 hr (ref 0–320)
Calcium, Ur: 15 mg/dL
Total Volume: 1110

## 2022-05-24 LAB — PTH-RELATED PEPTIDE: PTH-related peptide: <2 pmol/L

## 2022-05-26 LAB — VITAMIN D 1,25 DIHYDROXY
Vitamin D 1, 25 (OH)2 Total: 55 pg/mL
Vitamin D2 1, 25 (OH)2: <10 pg/mL
Vitamin D3 1, 25 (OH)2: 55 pg/mL

## 2022-05-28 NOTE — Progress Notes (Signed)
ASSESSMENT OF HYPERCALCEMIA:   - Urine studies do not show evidence of familial hypocalciuric hypercalcemia.  He has normal PTH, PTH related peptide, vitamin D 1, 25 dihydroxy, and vitamin D 25-hydroxy.  SPEP normal. - Prior labs show low TSH suspicious for hyperthyroidism, which could be causing some hypercalcemia.  **BONNIE: Patient needs referral for endocrinology workup due to "hyperthyroidism and hypercalcemia."  He was hospitalized in April 2022 with referral placed to endocrinology at that time, but I do not see any evidence of him having followed up.  He [facility] resides at SNF, if you and schedulers can coordinate with them to make sure he is seen by endocrinology.  Also, if you could please add repeat SPEP, immunofixation, and free light chains to his lab check in 4 months, that would be helpful.

## 2022-05-29 ENCOUNTER — Other Ambulatory Visit: Payer: Self-pay

## 2022-05-29 DIAGNOSIS — D649 Anemia, unspecified: Secondary | ICD-10-CM

## 2022-05-29 DIAGNOSIS — D500 Iron deficiency anemia secondary to blood loss (chronic): Secondary | ICD-10-CM

## 2022-05-29 NOTE — Progress Notes (Signed)
Referral and lab orders entered.  Schedulers aware of orders.

## 2022-09-15 ENCOUNTER — Inpatient Hospital Stay: Payer: Medicare Other | Attending: Hematology

## 2022-09-15 DIAGNOSIS — D649 Anemia, unspecified: Secondary | ICD-10-CM | POA: Insufficient documentation

## 2022-09-19 NOTE — Progress Notes (Deleted)
[facility] [address], [place] [address]   CLINIC:  Medical Oncology/Hematology  PCP:  Waldon Reining, MD [address] [place] [address] [phone]   REASON FOR VISIT:  Follow-up for normocytic anemia and hypercalcemia  CURRENT THERAPY: Daily iron tablet  INTERVAL HISTORY:   Mr. Morlock 72 y.o. male returns for routine follow-up of normocytic anemia.  He was last seen by Rojelio Brenner PA-C on 05/19/2022. Patient presents today with his legal guardian (Chance), who assists in providing history.***   At today's visit, he reports feeling well.  Overall, no changes in his overall energy levels or health status.  *** Patient's legal guardian reports that he has not had any recent bleeding. *** Iron, B12, folate?  He has ***% energy and ***% appetite. He endorses that he is maintaining a stable weight.  ASSESSMENT & PLAN:  1.  Normocytic anemia: - Patient seen at the request of Dr. Levon Hedger for further management of normocytic anemia. - He had history of GI bleed secondary to severe esophagitis, gastric ulcers. 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 - Most recent EGD (11/15/2020): Moderate gastritis, scars from healed ulcers in gastric body.  Duodenal polyp x 2. - Colonoscopy (11/15/2020): Nonbleeding hemorrhoids, polyp x 1.  Poor colon prep. - Additional hematology workup (10/11/2020): Normal LDH.  SPEP negative.  Elevated light chains in keeping with CKD.  Normal copper. - Patient currently taking liquid iron equivalent to ferrous sulfate 325 mg twice daily.*** -  Labs from January 2022 showed vitamin B12 deficiency 173, folic acid deficiency 5.9.  Taking B12 500 mcg and folic acid 500 mcg.*** - No bleeding  or melena reported.*** - Most recent labs from [facility] (***): Hgb***/MCV*** Ferritin***, iron saturation***%, TIBC*** Creatinine***/GFR***(CKD stage IIIa***) - DIFFERENTIAL DIAGNOSIS: Normocytic anemia secondary to chronic GI blood loss, iron deficiency, chronic disease, and CKD stage IIIa*** - PLAN: Continue oral iron therapy twice daily.*** - Continue B12 and folic acid supplements *** - Will recheck CBC, ferritin, iron panel, CMP in 4 months.*** - If there is drop in hemoglobin to less than 10, will consider IV iron therapy and/or ESA.***   2.  Weight loss, RESOLVED*** - Patient's guardian reported that he has had 50 pound weight loss from August 2021 through 2022, but has regained some of that weight back - No malignancy on EGD/colonoscopies performed in 2022 - CT chest and CT abdomen/pelvis from April 2022 were negative for malignancy - Patient unable to weigh on scale today, but per guardian he has gained back much of the weight he lost.  Patient has improved appetite and oral intake.***   3.   Mild hypercalcemia:*** - Urine studies do not show evidence of familial hypocalciuric hypercalcemia.  He has normal PTH, PTH related peptide, vitamin D 1, 25 dihydroxy, and vitamin D 25-hydroxy.  SPEP normal. - Prior labs show low TSH suspicious for hyperthyroidism, which could be causing some hypercalcemia. - Most recent labs (***) show mildly elevated calcium*** - MGUS/myeloma panel (***): *** - PLAN: Patient was referred to endocrinology for endocrinology workup of hypercalcemia and possible hyperthyroidism *** referral refused???  ***   4.  Social/family history: - He is now a resident at [facility] in [place].  He is seen with his  legal guardian, Chance. - No family history of sickle cell or thalassemia.  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 There were no vitals filed for this visit. Physical Exam  PAST MEDICAL/SURGICAL HISTORY:  Past Medical History:  Diagnosis Date   Acute kidney failure (HCC)    Acute respiratory failure (HCC)    Adenomatous polyp of stomach    Anemia    Benign prostate hyperplasia    Dementia (HCC)    Depression    Diabetes mellitus without complication (HCC)    DVT (deep venous thrombosis) (HCC)    Gastric ulcer    GERD (gastroesophageal reflux disease)    High cholesterol    Hypertension    Hypo-osmolality and hyponatremia    Insomnia    Neuromuscular dysfunction of bladder    Osteoarthritis    Partial intestinal obstruction (HCC)    Pneumonitis    Protein-calorie malnutrition, moderate (HCC)    Stroke (HCC)    Supplemental oxygen dependent    24/7   Wheelchair bound    Past Surgical History:  Procedure Laterality Date   BIOPSY  06/21/2020   Procedure: BIOPSY;  Surgeon: Corbin Ade, MD;  [facility];  Service: Endoscopy;;  gastric   BIOPSY  09/18/2020   Procedure: BIOPSY;  Surgeon: Dolores Frame, MD;  Location: [facility];  Service: Gastroenterology;;   COLONOSCOPY WITH PROPOFOL N/A 12/14/2020   Procedure: COLONOSCOPY WITH PROPOFOL;  Surgeon: Dolores Frame, MD;  [facility]: [facility];  Service: Gastroenterology;  Laterality: N/A;  8:55   ENDOSCOPIC MUCOSAL RESECTION N/A 11/15/2020   Procedure: ENDOSCOPIC MUCOSAL RESECTION;  Surgeon: Meridee Score Netty Starring., MD;  Location: [facility] ENDOSCOPY;  Service: Gastroenterology;  Laterality: N/A;   ESOPHAGOGASTRODUODENOSCOPY (EGD) WITH  PROPOFOL N/A 06/21/2020   Procedure: ESOPHAGOGASTRODUODENOSCOPY (EGD) WITH PROPOFOL;  Surgeon: Corbin Ade, MD;  [facility];  Service: Endoscopy;  Laterality: N/A;   ESOPHAGOGASTRODUODENOSCOPY (EGD) WITH PROPOFOL N/A 09/18/2020   Procedure: ESOPHAGOGASTRODUODENOSCOPY (EGD) WITH PROPOFOL;  Surgeon: Dolores Frame, MD;  [facility];  Service: Gastroenterology;  Laterality: N/A;   ESOPHAGOGASTRODUODENOSCOPY (EGD) WITH PROPOFOL N/A 11/15/2020   Procedure: ESOPHAGOGASTRODUODENOSCOPY (EGD) WITH PROPOFOL;  Surgeon: Meridee Score Netty Starring., MD;  Location[facility] ENDOSCOPY;  Service: Gastroenterology;  Laterality: N/A;   FLEXIBLE SIGMOIDOSCOPY N/A 11/15/2020   Procedure: FLEXIBLE SIGMOIDOSCOPY;  Surgeon: Meridee Score Netty Starring., MD;  Location: [facility] ENDOSCOPY;  Service: Gastroenterology;  Laterality: N/A;   HEMOSTASIS CLIP PLACEMENT  11/15/2020   Procedure: HEMOSTASIS CLIP PLACEMENT;  Surgeon: Lemar Lofty., MD;  Location: [facility] ENDOSCOPY;  Service: Gastroenterology;;   HIP SURGERY Left    KIDNEY SURGERY     kidney removed   POLYPECTOMY  11/15/2020   Procedure: POLYPECTOMY;  Surgeon: Mansouraty, Netty Starring., MD;  Location: [facility] ENDOSCOPY;  Service: Gastroenterology;;   SUBMUCOSAL LIFTING INJECTION  11/15/2020   Procedure: SUBMUCOSAL LIFTING INJECTION;  Surgeon: Lemar Lofty., MD;  Location: [facility] ENDOSCOPY;  Service: Gastroenterology;;   Vicie Mutters  INJECTION  11/15/2020   Procedure: SUBMUCOSAL TATTOO INJECTION;  Surgeon: Lemar Lofty., MD;  Location: [facility] ENDOSCOPY;  Service: Gastroenterology;;    SOCIAL HISTORY:  Social History   Socioeconomic History   Marital status: Single    Spouse name: Not on file   Number of children: Not on file   Years of education: Not on file   Highest education level: Not on file  Occupational History   Not on file  Tobacco Use   Smoking status: Never   Smokeless tobacco: Never  Vaping Use   Vaping Use: Never used   Substance and Sexual Activity   Alcohol use: No   Drug use: No   Sexual activity: Never  Other Topics Concern   Not on file  Social History Narrative   Not on file   Social Determinants of Health   Financial Resource Strain: Not on file  Food Insecurity: Not on file  Transportation Needs: Not on file  Physical Activity: Not on file  Stress: Not on file  Social Connections: Not on file  Intimate Partner Violence: Not At Risk (10/11/2020)   Humiliation, Afraid, Rape, and Kick questionnaire    Fear of Current or Ex-Partner: No    Emotionally Abused: No    Physically Abused: No    Sexually Abused: No    FAMILY HISTORY:  Family History  Problem Relation Age of Onset   Hypertension Sister    Hyperlipidemia Sister    Osteoarthritis Sister    Rheum arthritis Sister    Breast cancer Niece    Colon cancer Neg Hx    Esophageal cancer Neg Hx    Inflammatory bowel disease Neg Hx    Liver disease Neg Hx    Pancreatic cancer Neg Hx    Stomach cancer Neg Hx    Rectal cancer Neg Hx     CURRENT MEDICATIONS:  Outpatient Encounter Medications as of 09/22/2022  Medication Sig Note   atorvastatin (LIPITOR) 20 MG tablet Take 20 mg by mouth daily.    cholecalciferol (VITAMIN D3) 25 MCG (1000 UNIT) tablet Take 1,000 Units by mouth daily.    Ferrous Sulfate 220 (44 Fe) MG/5ML LIQD Take 7.5 mLs by mouth 2 (two) times daily.    folic acid (FOLVITE) 400 MCG tablet Take 800 mcg by mouth every morning. (0800)    hydrochlorothiazide (HYDRODIURIL) 25 MG tablet Take 25 mg by mouth 2 (two) times daily.    insulin glargine (LANTUS) 100 UNIT/ML injection Inject 7 Units into the skin daily. 7 units SQ QHS.    Insulin Lispro (HUMALOG KWIKPEN [place]) Inject into the skin. 7 units Q am    magnesium oxide (MAG-OX) 400 MG tablet Take 1 tablet (400 mg total) by mouth 2 (two) times daily.    metFORMIN (GLUCOPHAGE) 1000 MG tablet Take 1 tablet (1,000 mg total) by mouth 2 (two) times daily with a meal. (Patient  taking differently: Take 1,000 mg by mouth in the morning and at bedtime. (1000 & 1700))    metoCLOPramide (REGLAN) 5 MG tablet Take 1 tablet (5 mg total) by mouth 3 (three) times daily. For stomach    metoprolol tartrate (LOPRESSOR) 25 MG tablet Take 1 tablet (25 mg total) by mouth 2 (two) times daily. (Patient taking differently: Take 25 mg by mouth 2 (two) times daily. (1000 & 1700))    omeprazole (PRILOSEC) 20 MG capsule Take 20 mg by mouth 2 (two) times daily.    ondansetron (ZOFRAN) 4 MG tablet Take 1 tablet (  4 mg total) by mouth every 6 (six) hours as needed for nausea.    pantoprazole (PROTONIX) 40 MG tablet Take 1 tablet (40 mg total) by mouth 2 (two) times daily before a meal. 12/05/2020: (0800 & 2100)   polyethylene glycol (MIRALAX) 17 g packet Take 17 g by mouth 2 (two) times daily. For Constipation (Patient taking differently: Take 17 g by mouth 2 (two) times daily. (0800 & 2000) For Constipation)    pyridostigmine (MESTINON) 60 MG tablet Take 1 tablet (60 mg total) by mouth 3 (three) times daily.    simethicone (MYLICON) 125 MG chewable tablet Chew 125 mg by mouth every 6 (six) hours as needed for flatulence.    sucralfate (CARAFATE) 1 g tablet Take 1 tablet (1 g total) by mouth 4 (four) times daily. (Patient taking differently: Take 1 g by mouth 4 (four) times daily. (0900, 1300, 1700 & 2100))    tamsulosin (FLOMAX) 0.4 MG CAPS capsule TAKE 1 CAPSULE BY MOUTH AT BEDTIME. (Patient taking differently: Take 0.4 mg by mouth at bedtime. (2100))    traZODone (DESYREL) 100 MG tablet Take 100 mg by mouth at bedtime. (2100)    TRULICITY 0.75 MG/0.5ML SOPN Inject into the skin.    vitamin B-12 (CYANOCOBALAMIN) 500 MCG tablet Take 1,000 mcg by mouth daily. (0900)    XARELTO 20 MG TABS tablet Take 20 mg by mouth daily.    [DISCONTINUED] ramipril (ALTACE) 10 MG capsule Take 2 capsules (20 mg total) by mouth daily.    No facility-administered encounter medications on file as of 09/22/2022.     ALLERGIES:  No Known Allergies  LABORATORY DATA:  I have reviewed the labs as listed.  CBC    Component Value Date/Time   WBC 5.9 06/18/2021 0807   RBC 4.46 06/18/2021 0807   HGB 12.5 (L) 06/18/2021 0807   HCT 38.8 (L) 06/18/2021 0807   PLT 219 06/18/2021 0807   MCV 87.0 06/18/2021 0807   MCH 28.0 06/18/2021 0807   MCHC 32.2 06/18/2021 0807   RDW 15.1 06/18/2021 0807   LYMPHSABS 1.2 06/18/2021 0807   MONOABS 0.5 06/18/2021 0807   EOSABS 0.2 06/18/2021 0807   BASOSABS 0.0 06/18/2021 0807      Latest Ref Rng & Units 05/19/2022    1:37 PM 06/18/2021    8:07 AM 02/05/2021    3:21 PM  CMP  Glucose 70 - 99 mg/dL  098  119   BUN 8 - 23 mg/dL  32  40   Creatinine 1.47 - 1.24 mg/dL  8.29  5.62   Sodium 130 - 145 mmol/L  135  132   Potassium 3.5 - 5.1 mmol/L  4.0  4.5   Chloride 98 - 111 mmol/L  99  95   CO2 22 - 32 mmol/L  26  27   Calcium 8.6 - 10.2 mg/dL 86.5  78.4    69.6  29.5    11.2   Total Protein 6.5 - 8.1 g/dL  7.4  7.6   Total Bilirubin 0.3 - 1.2 mg/dL  0.3  0.2   Alkaline Phos 38 - 126 U/L  100  86   AST 15 - 41 U/L  13  12   ALT 0 - 44 U/L  14  11     DIAGNOSTIC IMAGING:  I have independently reviewed the relevant imaging and discussed with the patient.   WRAP UP:  All questions were answered. The patient knows to call the clinic with any problems, questions  or concerns.  Medical decision making: ***  Time spent on visit: I spent *** minutes counseling the patient face to face. The total time spent in the appointment was *** minutes and more than 50% was on counseling.  Carnella Guadalajara, PA-C  ***

## 2022-09-22 ENCOUNTER — Inpatient Hospital Stay: Payer: Medicare Other

## 2022-09-22 ENCOUNTER — Inpatient Hospital Stay: Payer: Medicare Other | Admitting: Physician Assistant

## 2022-09-22 DIAGNOSIS — E440 Moderate protein-calorie malnutrition: Secondary | ICD-10-CM

## 2022-09-22 DIAGNOSIS — D649 Anemia, unspecified: Secondary | ICD-10-CM | POA: Diagnosis present

## 2022-09-22 DIAGNOSIS — D631 Anemia in chronic kidney disease: Secondary | ICD-10-CM

## 2022-09-22 DIAGNOSIS — D500 Iron deficiency anemia secondary to blood loss (chronic): Secondary | ICD-10-CM

## 2022-09-22 LAB — CBC WITH DIFFERENTIAL/PLATELET
Abs Immature Granulocytes: 0.02 10*3/uL (ref 0.00–0.07)
Basophils Absolute: 0 10*3/uL (ref 0.0–0.1)
Basophils Relative: 0 %
Eosinophils Absolute: 0 10*3/uL (ref 0.0–0.5)
Eosinophils Relative: 0 %
HCT: 35.5 % — ABNORMAL LOW (ref 39.0–52.0)
Hemoglobin: 11.4 g/dL — ABNORMAL LOW (ref 13.0–17.0)
Immature Granulocytes: 0 %
Lymphocytes Relative: 15 %
Lymphs Abs: 0.9 10*3/uL (ref 0.7–4.0)
MCH: 27.3 pg (ref 26.0–34.0)
MCHC: 32.1 g/dL (ref 30.0–36.0)
MCV: 84.9 fL (ref 80.0–100.0)
Monocytes Absolute: 0.4 10*3/uL (ref 0.1–1.0)
Monocytes Relative: 6 %
Neutro Abs: 4.7 10*3/uL (ref 1.7–7.7)
Neutrophils Relative %: 79 %
Platelets: 233 10*3/uL (ref 150–400)
RBC: 4.18 MIL/uL — ABNORMAL LOW (ref 4.22–5.81)
RDW: 14.6 % (ref 11.5–15.5)
WBC: 6.1 10*3/uL (ref 4.0–10.5)
nRBC: 0 % (ref 0.0–0.2)

## 2022-09-22 LAB — VITAMIN B12: Vitamin B-12: 462 pg/mL (ref 180–914)

## 2022-09-22 LAB — FOLATE: Folate: 27.8 ng/mL (ref >5.9)

## 2022-09-24 LAB — PROTEIN ELECTROPHORESIS, SERUM
A/G Ratio: 1.1 (ref 0.7–1.7)
Albumin ELP: 3.4 g/dL (ref 2.9–4.4)
Alpha-1-Globulin: 0.2 g/dL (ref 0.0–0.4)
Alpha-2-Globulin: 0.7 g/dL (ref 0.4–1.0)
Beta Globulin: 0.9 g/dL (ref 0.7–1.3)
Gamma Globulin: 1.2 g/dL (ref 0.4–1.8)
Globulin, Total: 3.1 g/dL (ref 2.2–3.9)
Total Protein ELP: 6.5 g/dL (ref 6.0–8.5)

## 2022-09-24 LAB — KAPPA/LAMBDA LIGHT CHAINS
Kappa free light chain: 70.2 mg/L — ABNORMAL HIGH (ref 3.3–19.4)
Kappa, lambda light chain ratio: 2.26 — ABNORMAL HIGH (ref 0.26–1.65)
Lambda free light chains: 31 mg/L — ABNORMAL HIGH (ref 5.7–26.3)

## 2022-09-29 LAB — METHYLMALONIC ACID, SERUM: Methylmalonic Acid, Quantitative: 225 nmol/L (ref 0–378)

## 2022-09-30 LAB — IMMUNOFIXATION ELECTROPHORESIS
IgA: 259 mg/dL (ref 61–437)
IgG (Immunoglobin G), Serum: 1296 mg/dL (ref 603–1613)
IgM (Immunoglobulin M), Srm: 32 mg/dL (ref 20–172)
Total Protein ELP: 6.9 g/dL (ref 6.0–8.5)

## 2022-10-01 ENCOUNTER — Other Ambulatory Visit: Payer: Medicare Other

## 2022-10-08 ENCOUNTER — Encounter (HOSPITAL_COMMUNITY): Payer: Self-pay | Admitting: Hematology

## 2022-10-08 NOTE — Progress Notes (Unsigned)
[facility] [address], [place] [address]   CLINIC:  Medical Oncology/Hematology  PCP:  Daniel Reining, MD [address] [place] [address] [phone]   REASON FOR VISIT:  Follow-up for normocytic anemia and hypercalcemia  CURRENT THERAPY: Daily iron tablet  INTERVAL HISTORY:   Daniel [name] 72 y.o. male returns for routine follow-up of normocytic anemia.  He was last seen by Daniel Brenner PA-C on 05/19/2022. Patient presents today with his legal guardian, who assists in providing history.  At today's visit, he reports feeling well.  Overall, no changes in his overall energy levels or health status.  Patient's legal guardian reports that he has not had any recent bleeding.  He has 75% energy and 100% appetite. He endorses that he is maintaining a stable weight.  ASSESSMENT & PLAN:  1.  Normocytic anemia: - Patient seen at the request of Daniel [name] for further management of normocytic anemia. - He had history of GI bleed secondary to severe esophagitis, gastric ulcers. 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 - Most recent EGD (11/15/2020): Moderate gastritis, scars from healed ulcers in gastric body.  Duodenal polyp x 2. - Colonoscopy (11/15/2020): Nonbleeding hemorrhoids, polyp x 1.  Poor colon prep. - Additional hematology workup (10/11/2020): Normal LDH.  SPEP negative.  Elevated light chains in keeping with CKD.  Normal copper. - Patient currently taking ferrous sulfate 325 mg daily. -  Labs from January 2022 showed vitamin B12 deficiency 173, folic acid deficiency 5.9.  Taking daily B12 1000 mcg and folic acid 800 mcg. - No bleeding or melena reported. - Most recent labs from [facility]  (09/18/2022) and [facility] labs (09/22/2022): Hgb 11.4/MCV 84.9 Ferritin 45, iron saturation 46%, TIBC 246 Creatinine 1.59/GFR 47 (CKD stage IIIa) SPEP, immunofixation normal. 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 - Continue B12 and folic acid supplements  - Will recheck CBC, ferritin, iron panel, CMP in 6 months. - If there is drop in hemoglobin to less than 10, will consider IV iron therapy and/or ESA.   2.  Weight loss, RESOLVED - Patient's guardian reported that he has had 50 pound weight loss from August 2021 through 2022, but has regained some of that weight back - No malignancy on EGD/colonoscopies performed in 2022 - CT chest and CT abdomen/pelvis from April 2022 were negative for malignancy - Patient unable to weigh on scale today, but per guardian he has gained back much of the weight he lost.  Patient has improved appetite and oral intake.   3.   Mild hypercalcemia: - Urine studies do not show evidence of familial hypocalciuric hypercalcemia.  He has normal PTH, PTH related peptide, vitamin D 1, 25 dihydroxy, and vitamin D 25-hydroxy.  SPEP normal. - Prior labs show low TSH suspicious for hyperthyroidism, which could be causing some hypercalcemia. 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  Elevated kappa free light chain 70.2, elevated lambda free light chain 31.0, mildly elevated FLC ratio 2.26 in keeping with his underlying CKD  stage IIIa. - PLAN: Patient was referred to endocrinology for endocrinology workup of hypercalcemia and possible hyperthyroidism.  New patient visit with Daniel [name] is scheduled for 11/04/2022 at 2:00 PM    4.  Social/family history: - He is now a resident at [facility] in [place].  He is seen with his legal guardian, Daniel [name]. - No family history of sickle cell or thalassemia.  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  HENT:   Negative for lump/mass and nosebleeds.   Eyes:  Negative for eye problems.  Respiratory:  Negative for cough, hemoptysis and shortness of breath.   Cardiovascular:  Negative for chest pain, leg swelling and palpitations.  Gastrointestinal:  Negative for abdominal pain, blood in stool, constipation, diarrhea, nausea and vomiting.  Genitourinary:  Negative for hematuria.   Skin: Negative.   Neurological:  Negative for dizziness, headaches and light-headedness.  Hematological:  Does not bruise/bleed easily.     PHYSICAL EXAM:  ECOG PERFORMANCE STATUS: 3 - Symptomatic, >50% confined to bed  There were no vitals filed for this visit. There were no vitals filed for this visit. Physical Exam Constitutional:      Appearance: Normal appearance. He is obese.     Comments: Presents in wheelchair  Cardiovascular:     Heart sounds: Normal heart sounds.  Pulmonary:     Breath sounds: Normal breath sounds.  Neurological:     General: No focal deficit present.     Mental Status: Mental status is at baseline.  Psychiatric:        Behavior: Behavior normal. Behavior is cooperative.     PAST MEDICAL/SURGICAL HISTORY:  Past Medical History:  Diagnosis Date   Acute kidney failure (HCC)    Acute respiratory failure (HCC)    Adenomatous polyp of stomach    Anemia    Benign prostate hyperplasia    Dementia (HCC)     Depression    Diabetes mellitus without complication (HCC)    DVT (deep venous thrombosis) (HCC)    Gastric ulcer    GERD (gastroesophageal reflux disease)    High cholesterol    Hypertension    Hypo-osmolality and hyponatremia    Insomnia    Neuromuscular dysfunction of bladder    Osteoarthritis    Partial intestinal obstruction (HCC)    Pneumonitis    Protein-calorie malnutrition, moderate (HCC)    Stroke (HCC)    Supplemental oxygen dependent    24/7   Wheelchair bound    Past Surgical History:  Procedure Laterality Date   BIOPSY  06/21/2020   Procedure: BIOPSY;  Surgeon: Corbin Ade, MD;  [facility];  Service: Endoscopy;;  gastric   BIOPSY  09/18/2020   Procedure: BIOPSY;  Surgeon: Dolores Frame, MD;  [facility];  Service: Gastroenterology;;   COLONOSCOPY WITH PROPOFOL N/A 12/14/2020   Procedure: COLONOSCOPY WITH PROPOFOL;  Surgeon: Dolores Frame, MD;  [facility];  Service: Gastroenterology;  Laterality: N/A;  8:55   ENDOSCOPIC MUCOSAL RESECTION N/A 11/15/2020   Procedure: ENDOSCOPIC MUCOSAL RESECTION;  Surgeon:  Mansouraty, Netty Starring., MD;  Location: [facility] ENDOSCOPY;  Service: Gastroenterology;  Laterality: N/A;   ESOPHAGOGASTRODUODENOSCOPY (EGD) WITH PROPOFOL N/A 06/21/2020   Procedure: ESOPHAGOGASTRODUODENOSCOPY (EGD) WITH PROPOFOL;  Surgeon: Corbin Ade, MD;  [facility];  Service: Endoscopy;  Laterality: N/A;   ESOPHAGOGASTRODUODENOSCOPY (EGD) WITH PROPOFOL N/A 09/18/2020   Procedure: ESOPHAGOGASTRODUODENOSCOPY (EGD) WITH PROPOFOL;  Surgeon: Dolores Frame, MD;  [facility];  Service: Gastroenterology;  Laterality: N/A;   ESOPHAGOGASTRODUODENOSCOPY (EGD) WITH PROPOFOL N/A 11/15/2020   Procedure: ESOPHAGOGASTRODUODENOSCOPY (EGD) WITH PROPOFOL;  Surgeon: Meridee Score Netty Starring., MD;  Location: [facility] ENDOSCOPY;  Service: Gastroenterology;  Laterality: N/A;   FLEXIBLE SIGMOIDOSCOPY N/A  11/15/2020   Procedure: FLEXIBLE SIGMOIDOSCOPY;  Surgeon: Meridee Score Netty Starring., MD;  Location: [facility] ENDOSCOPY;  Service: Gastroenterology;  Laterality: N/A;   HEMOSTASIS CLIP PLACEMENT  11/15/2020   Procedure: HEMOSTASIS CLIP PLACEMENT;  Surgeon: Lemar Lofty., MD;  Location: [facility] ENDOSCOPY;  Service: Gastroenterology;;   HIP SURGERY Left    KIDNEY SURGERY     kidney removed   POLYPECTOMY  11/15/2020   Procedure: POLYPECTOMY;  Surgeon: Mansouraty, Netty Starring., MD;  Location: [facility] ENDOSCOPY;  Service: Gastroenterology;;   SUBMUCOSAL LIFTING INJECTION  11/15/2020   Procedure: SUBMUCOSAL LIFTING INJECTION;  Surgeon: Lemar Lofty., MD;  Location: [facility] ENDOSCOPY;  Service: Gastroenterology;;   SUBMUCOSAL TATTOO INJECTION  11/15/2020   Procedure: SUBMUCOSAL TATTOO INJECTION;  Surgeon: Lemar Lofty., MD;  Location[facility] ENDOSCOPY;  Service: Gastroenterology;;    SOCIAL HISTORY:  Social History   Socioeconomic History   Marital status: Single    Spouse name: Not on file   Number of children: Not on file   Years of education: Not on file   Highest education level: Not on file  Occupational History   Not on file  Tobacco Use   Smoking status: Never   Smokeless tobacco: Never  Vaping Use   Vaping Use: Never used  Substance and Sexual Activity   Alcohol use: No   Drug use: No   Sexual activity: Never  Other Topics Concern   Not on file  Social History Narrative   Not on file   Social Determinants of Health   Financial Resource Strain: Not on file  Food Insecurity: Not on file  Transportation Needs: Not on file  Physical Activity: Not on file  Stress: Not on file  Social Connections: Not on file  Intimate Partner Violence: Not At Risk (10/11/2020)   Humiliation, Afraid, Rape, and Kick questionnaire    Fear of Current or Ex-Partner: No    Emotionally Abused: No    Physically Abused: No    Sexually Abused: No    FAMILY HISTORY:  Family History  Problem Relation  Age of Onset   Hypertension Sister    Hyperlipidemia Sister    Osteoarthritis Sister    Rheum arthritis Sister    Breast cancer Niece    Colon cancer Neg Hx    Esophageal cancer Neg Hx    Inflammatory bowel disease Neg Hx    Liver disease Neg Hx    Pancreatic cancer Neg Hx    Stomach cancer Neg Hx    Rectal cancer Neg Hx     CURRENT MEDICATIONS:  Outpatient Encounter Medications as of 10/09/2022  Medication Sig Note   atorvastatin (LIPITOR) 20 MG tablet Take 20 mg by mouth daily.    cholecalciferol (VITAMIN D3) 25 MCG (1000 UNIT) tablet Take 1,000 Units by mouth daily.    Ferrous Sulfate 220 (  44 Fe) MG/5ML LIQD Take 7.5 mLs by mouth 2 (two) times daily.    folic acid (FOLVITE) 400 MCG tablet Take 800 mcg by mouth every morning. (0800)    hydrochlorothiazide (HYDRODIURIL) 25 MG tablet Take 25 mg by mouth 2 (two) times daily.    insulin glargine (LANTUS) 100 UNIT/ML injection Inject 7 Units into the skin daily. 7 units SQ QHS.    Insulin Lispro (HUMALOG KWIKPEN [place]) Inject into the skin. 7 units Q am    magnesium oxide (MAG-OX) 400 MG tablet Take 1 tablet (400 mg total) by mouth 2 (two) times daily.    metFORMIN (GLUCOPHAGE) 1000 MG tablet Take 1 tablet (1,000 mg total) by mouth 2 (two) times daily with a meal. (Patient taking differently: Take 1,000 mg by mouth in the morning and at bedtime. (1000 & 1700))    metoCLOPramide (REGLAN) 5 MG tablet Take 1 tablet (5 mg total) by mouth 3 (three) times daily. For stomach    metoprolol tartrate (LOPRESSOR) 25 MG tablet Take 1 tablet (25 mg total) by mouth 2 (two) times daily. (Patient taking differently: Take 25 mg by mouth 2 (two) times daily. (1000 & 1700))    omeprazole (PRILOSEC) 20 MG capsule Take 20 mg by mouth 2 (two) times daily.    ondansetron (ZOFRAN) 4 MG tablet Take 1 tablet (4 mg total) by mouth every 6 (six) hours as needed for nausea.    pantoprazole (PROTONIX) 40 MG tablet Take 1 tablet (40 mg total) by mouth 2 (two) times daily  before a meal. 12/05/2020: (0800 & 2100)   polyethylene glycol (MIRALAX) 17 g packet Take 17 g by mouth 2 (two) times daily. For Constipation (Patient taking differently: Take 17 g by mouth 2 (two) times daily. (0800 & 2000) For Constipation)    pyridostigmine (MESTINON) 60 MG tablet Take 1 tablet (60 mg total) by mouth 3 (three) times daily.    simethicone (MYLICON) 125 MG chewable tablet Chew 125 mg by mouth every 6 (six) hours as needed for flatulence.    sucralfate (CARAFATE) 1 g tablet Take 1 tablet (1 g total) by mouth 4 (four) times daily. (Patient taking differently: Take 1 g by mouth 4 (four) times daily. (0900, 1300, 1700 & 2100))    tamsulosin (FLOMAX) 0.4 MG CAPS capsule TAKE 1 CAPSULE BY MOUTH AT BEDTIME. (Patient taking differently: Take 0.4 mg by mouth at bedtime. (2100))    traZODone (DESYREL) 100 MG tablet Take 100 mg by mouth at bedtime. (2100)    TRULICITY 0.75 MG/0.5ML SOPN Inject into the skin.    vitamin B-12 (CYANOCOBALAMIN) 500 MCG tablet Take 1,000 mcg by mouth daily. (0900)    XARELTO 20 MG TABS tablet Take 20 mg by mouth daily.    [DISCONTINUED] ramipril (ALTACE) 10 MG capsule Take 2 capsules (20 mg total) by mouth daily.    No facility-administered encounter medications on file as of 10/09/2022.    ALLERGIES:  No Known Allergies  LABORATORY DATA:  I have reviewed the labs as listed.  CBC    Component Value Date/Time   WBC 6.1 09/22/2022 1258   RBC 4.18 (L) 09/22/2022 1258   HGB 11.4 (L) 09/22/2022 1258   HCT 35.5 (L) 09/22/2022 1258   PLT 233 09/22/2022 1258   MCV 84.9 09/22/2022 1258   MCH 27.3 09/22/2022 1258   MCHC 32.1 09/22/2022 1258   RDW 14.6 09/22/2022 1258   LYMPHSABS 0.9 09/22/2022 1258   MONOABS 0.4 09/22/2022 1258   EOSABS 0.0  09/22/2022 1258   BASOSABS 0.0 09/22/2022 1258      Latest Ref Rng & Units 05/19/2022    1:37 PM 06/18/2021    8:07 AM 02/05/2021    3:21 PM  CMP  Glucose 70 - 99 mg/dL  782  956   BUN 8 - 23 mg/dL  32  40    Creatinine 2.13 - 1.24 mg/dL  0.86  5.78   Sodium 469 - 145 mmol/L  135  132   Potassium 3.5 - 5.1 mmol/L  4.0  4.5   Chloride 98 - 111 mmol/L  99  95   CO2 22 - 32 mmol/L  26  27   Calcium 8.6 - 10.2 mg/dL 62.9  52.8    41.3  24.4    11.2   Total Protein 6.5 - 8.1 g/dL  7.4  7.6   Total Bilirubin 0.3 - 1.2 mg/dL  0.3  0.2   Alkaline Phos 38 - 126 U/L  100  86   AST 15 - 41 U/L  13  12   ALT 0 - 44 U/L  14  11     DIAGNOSTIC IMAGING:  I have independently reviewed the relevant imaging and discussed with the patient.   WRAP UP:  All questions were answered. The patient knows to call the clinic with any problems, questions or concerns.  Medical decision making: Low  Time spent on visit: I spent 15 minutes counseling the patient face to face. The total time spent in the appointment was 22 minutes and more than 50% was on counseling.  Carnella Guadalajara, PA-C  10/09/22 1:27 PM

## 2022-10-09 ENCOUNTER — Encounter: Payer: Self-pay | Admitting: "Endocrinology

## 2022-10-09 ENCOUNTER — Inpatient Hospital Stay: Payer: Medicare Other | Attending: Physician Assistant | Admitting: Physician Assistant

## 2022-10-09 VITALS — BP 144/82 | HR 98 | Temp 99.2°F | Resp 18

## 2022-10-09 DIAGNOSIS — D631 Anemia in chronic kidney disease: Secondary | ICD-10-CM | POA: Diagnosis not present

## 2022-10-09 DIAGNOSIS — I129 Hypertensive chronic kidney disease with stage 1 through stage 4 chronic kidney disease, or unspecified chronic kidney disease: Secondary | ICD-10-CM | POA: Insufficient documentation

## 2022-10-09 DIAGNOSIS — D649 Anemia, unspecified: Secondary | ICD-10-CM | POA: Diagnosis present

## 2022-10-09 DIAGNOSIS — Z7984 Long term (current) use of oral hypoglycemic drugs: Secondary | ICD-10-CM | POA: Insufficient documentation

## 2022-10-09 DIAGNOSIS — Z7901 Long term (current) use of anticoagulants: Secondary | ICD-10-CM | POA: Diagnosis not present

## 2022-10-09 DIAGNOSIS — N183 Chronic kidney disease, stage 3 unspecified: Secondary | ICD-10-CM | POA: Diagnosis not present

## 2022-10-09 DIAGNOSIS — Z79899 Other long term (current) drug therapy: Secondary | ICD-10-CM | POA: Diagnosis not present

## 2022-10-09 DIAGNOSIS — Z803 Family history of malignant neoplasm of breast: Secondary | ICD-10-CM | POA: Insufficient documentation

## 2022-10-09 DIAGNOSIS — E538 Deficiency of other specified B group vitamins: Secondary | ICD-10-CM | POA: Diagnosis not present

## 2022-10-09 DIAGNOSIS — E1122 Type 2 diabetes mellitus with diabetic chronic kidney disease: Secondary | ICD-10-CM | POA: Diagnosis not present

## 2022-10-09 DIAGNOSIS — K2100 Gastro-esophageal reflux disease with esophagitis, without bleeding: Secondary | ICD-10-CM | POA: Diagnosis not present

## 2022-10-09 DIAGNOSIS — N1831 Chronic kidney disease, stage 3a: Secondary | ICD-10-CM | POA: Insufficient documentation

## 2022-10-09 DIAGNOSIS — Z7985 Long-term (current) use of injectable non-insulin antidiabetic drugs: Secondary | ICD-10-CM | POA: Insufficient documentation

## 2022-10-09 DIAGNOSIS — Z794 Long term (current) use of insulin: Secondary | ICD-10-CM | POA: Diagnosis not present

## 2022-10-09 NOTE — Patient Instructions (Addendum)
  [facility] **VISIT SUMMARY & IMPORTANT INSTRUCTIONS **   You were seen today by Rojelio Brenner PA-C for your anemia.    ANEMIA Your anemia is due to your chronic kidney disease and iron deficiency. Your blood and iron levels are at goal. Continue to take iron, B12, and folic acid supplements daily. You do not need any IV iron. Will check your labs again in 6 months.  ELEVATED CALCIUM We have referred you to endocrinologist for further testing and treatment of your elevated calcium. You have an appointment to see Dr. Fransico Him on 11/04/2022 at 2:00 PM.    LABS: Return to [facility] for labs in 6 months  FOLLOW-UP APPOINTMENT: Office visit in 6 months, after labs have been completed  ** Thank you for trusting me with your healthcare!  I strive to provide all of my patients with quality care at each visit.  If you receive a survey for this visit, I would be so grateful to you for taking the time to provide feedback.  Thank you in advance!  ~ Tanice Petre                   Dr. Doreatha Massed   &   Rojelio Brenner, PA-C   - - - - - - - - - - - - - - - - - -    Thank you for choosing [facility] at [facility] to provide your oncology and hematology care.  To afford each patient quality time with our provider, please arrive at least 15 minutes before your scheduled appointment time.   If you have a lab appointment with the Cancer Center please come in thru the Main Entrance and check in at the main information desk.  You need to re-schedule your appointment should you arrive 10 or more minutes late.  We strive to give you quality time with our providers, and arriving late affects you and other patients whose appointments are after yours.  Also, if you no show three or more times for appointments you may be dismissed from the clinic at the providers discretion.     Again, thank you for choosing [facility].  Our  hope is that these requests will decrease the amount of time that you wait before being seen by our physicians.       _____________________________________________________________  Should you have questions after your visit to [facility], please contact our office at [phone] and follow the prompts.  Our office hours are 8:00 a.m. and 4:30 p.m. Monday - Friday.  Please note that voicemails left after 4:00 p.m. may not be returned until the following business day.  We are closed weekends and major holidays.  You do have access to a nurse 24-7, just call the main number to the clinic [phone] and do not press any options, hold on the line and a nurse will answer the phone.    For prescription refill requests, have your pharmacy contact our office and allow 72 hours.

## 2022-10-13 ENCOUNTER — Other Ambulatory Visit: Payer: Self-pay

## 2022-10-13 DIAGNOSIS — N183 Anemia in chronic kidney disease: Secondary | ICD-10-CM

## 2022-10-18 LAB — LAB REPORT - SCANNED: EGFR: 53

## 2022-10-28 ENCOUNTER — Encounter (HOSPITAL_COMMUNITY): Payer: Self-pay | Admitting: Hematology

## 2022-11-04 ENCOUNTER — Encounter: Payer: Self-pay | Admitting: "Endocrinology

## 2022-11-04 ENCOUNTER — Ambulatory Visit (INDEPENDENT_AMBULATORY_CARE_PROVIDER_SITE_OTHER): Payer: Medicare Other | Admitting: "Endocrinology

## 2022-11-04 ENCOUNTER — Encounter: Payer: Self-pay | Admitting: Physician Assistant

## 2022-11-04 NOTE — Progress Notes (Signed)
Endocrinology Consult Note                                            11/04/2022, 6:09 PM   Subjective:    Patient ID: Daniel [name], male    DOB: 05-17-1951, PCP Charlynne Pander, MD   Past Medical History:  Diagnosis Date   Acute kidney failure (HCC)    Acute respiratory failure (HCC)    Adenomatous polyp of stomach    Anemia    Benign prostate hyperplasia    Dementia (HCC)    Depression    Diabetes mellitus without complication (HCC)    DVT (deep venous thrombosis) (HCC)    Gastric ulcer    GERD (gastroesophageal reflux disease)    High cholesterol    Hypertension    Hypo-osmolality and hyponatremia    Insomnia    Neuromuscular dysfunction of bladder    Osteoarthritis    Partial intestinal obstruction (HCC)    Pneumonitis    Protein-calorie malnutrition, moderate (HCC)    Stroke (HCC)    Supplemental oxygen dependent    24/7   Wheelchair bound    Past Surgical History:  Procedure Laterality Date   BIOPSY  06/21/2020   Procedure: BIOPSY;  Surgeon: Corbin Ade, MD;  [facility]: [facility];  Service: Endoscopy;;  gastric   BIOPSY  09/18/2020   Procedure: BIOPSY;  Surgeon: Dolores Frame, MD;  Location: [facility];  Service: Gastroenterology;;   COLONOSCOPY WITH PROPOFOL N/A 12/14/2020   Procedure: COLONOSCOPY WITH PROPOFOL;  Surgeon: Dolores Frame, MD;  Location: [facility];  Service: Gastroenterology;  Laterality: N/A;  8:55   ENDOSCOPIC MUCOSAL RESECTION N/A 11/15/2020   Procedure: ENDOSCOPIC MUCOSAL RESECTION;  Surgeon: Meridee Score Netty Starring., MD;  Location: [facility] ENDOSCOPY;  Service: Gastroenterology;  Laterality: N/A;   ESOPHAGOGASTRODUODENOSCOPY (EGD) WITH PROPOFOL N/A 06/21/2020   Procedure: ESOPHAGOGASTRODUODENOSCOPY (EGD) WITH PROPOFOL;  Surgeon: Corbin Ade, MD;  [facility]: [facility];  Service: Endoscopy;  Laterality: N/A;   ESOPHAGOGASTRODUODENOSCOPY (EGD) WITH PROPOFOL N/A 09/18/2020   Procedure:  ESOPHAGOGASTRODUODENOSCOPY (EGD) WITH PROPOFOL;  Surgeon: Dolores Frame, MD;  [facility]: [facility];  Service: Gastroenterology;  Laterality: N/A;   ESOPHAGOGASTRODUODENOSCOPY (EGD) WITH PROPOFOL N/A 11/15/2020   Procedure: ESOPHAGOGASTRODUODENOSCOPY (EGD) WITH PROPOFOL;  Surgeon: Meridee Score Netty Starring., MD;  Location: [facility] ENDOSCOPY;  Service: Gastroenterology;  Laterality: N/A;   FLEXIBLE SIGMOIDOSCOPY N/A 11/15/2020   Procedure: FLEXIBLE SIGMOIDOSCOPY;  Surgeon: Meridee Score Netty Starring., MD;  Location: [facility] ENDOSCOPY;  Service: Gastroenterology;  Laterality: N/A;   HEMOSTASIS CLIP PLACEMENT  11/15/2020   Procedure: HEMOSTASIS CLIP PLACEMENT;  Surgeon: Lemar Lofty., MD;  Location: [facility] ENDOSCOPY;  Service: Gastroenterology;;   HIP SURGERY Left    KIDNEY SURGERY     kidney removed   POLYPECTOMY  11/15/2020   Procedure: POLYPECTOMY;  Surgeon: Mansouraty, Netty Starring., MD;  Location: [facility] ENDOSCOPY;  Service: Gastroenterology;;   SUBMUCOSAL LIFTING INJECTION  11/15/2020   Procedure: SUBMUCOSAL LIFTING INJECTION;  Surgeon: Lemar Lofty., MD;  Location: [facility] ENDOSCOPY;  Service: Gastroenterology;;   SUBMUCOSAL TATTOO INJECTION  11/15/2020   Procedure: SUBMUCOSAL TATTOO INJECTION;  Surgeon: Lemar Lofty., MD;  Location: [facility] ENDOSCOPY;  Service: Gastroenterology;;   Social History   Socioeconomic History   Marital status: Single    Spouse name: Not on file   Number of  children: Not on file   Years of education: Not on file   Highest education level: Not on file  Occupational History   Not on file  Tobacco Use   Smoking status: Never   Smokeless tobacco: Never  Vaping Use   Vaping Use: Never used  Substance and Sexual Activity   Alcohol use: No   Drug use: No   Sexual activity: Never  Other Topics Concern   Not on file  Social History Narrative   Not on file   Social Determinants of Health   Financial Resource Strain: Not on file  Food Insecurity: Not on  file  Transportation Needs: Not on file  Physical Activity: Not on file  Stress: Not on file  Social Connections: Not on file   Family History  Problem Relation Age of Onset   Hypertension Sister    Hyperlipidemia Sister    Osteoarthritis Sister    Rheum arthritis Sister    Breast cancer Niece    Colon cancer Neg Hx    Esophageal cancer Neg Hx    Inflammatory bowel disease Neg Hx    Liver disease Neg Hx    Pancreatic cancer Neg Hx    Stomach cancer Neg Hx    Rectal cancer Neg Hx    Outpatient Encounter Medications as of 11/04/2022  Medication Sig   atorvastatin (LIPITOR) 20 MG tablet Take 20 mg by mouth daily.   cholecalciferol (VITAMIN D3) 25 MCG (1000 UNIT) tablet Take 1,000 Units by mouth daily.   Ferrous Sulfate 220 (44 Fe) MG/5ML LIQD Take 7.5 mLs by mouth 2 (two) times daily.   folic acid (FOLVITE) 400 MCG tablet Take 800 mcg by mouth every morning. (0800)   hydrochlorothiazide (HYDRODIURIL) 25 MG tablet Take 25 mg by mouth 2 (two) times daily.   insulin glargine (LANTUS) 100 UNIT/ML injection Inject 7 Units into the skin daily. 7 units SQ QHS.   Insulin Lispro (HUMALOG KWIKPEN ) Inject into the skin. 7 units Q am   magnesium oxide (MAG-OX) 400 MG tablet Take 1 tablet (400 mg total) by mouth 2 (two) times daily.   metFORMIN (GLUCOPHAGE) 1000 MG tablet Take 1 tablet (1,000 mg total) by mouth 2 (two) times daily with a meal. (Patient taking differently: Take 1,000 mg by mouth in the morning and at bedtime. (1000 & 1700))   metoCLOPramide (REGLAN) 5 MG tablet Take 1 tablet (5 mg total) by mouth 3 (three) times daily. For stomach   metoprolol tartrate (LOPRESSOR) 25 MG tablet Take 1 tablet (25 mg total) by mouth 2 (two) times daily. (Patient taking differently: Take 25 mg by mouth 2 (two) times daily. (1000 & 1700))   omeprazole (PRILOSEC) 20 MG capsule Take 20 mg by mouth 2 (two) times daily.   ondansetron (ZOFRAN) 4 MG tablet Take 1 tablet (4 mg total) by mouth every 6 (six)  hours as needed for nausea.   pantoprazole (PROTONIX) 40 MG tablet Take 1 tablet (40 mg total) by mouth 2 (two) times daily before a meal.   polyethylene glycol (MIRALAX) 17 g packet Take 17 g by mouth 2 (two) times daily. For Constipation (Patient taking differently: Take 17 g by mouth 2 (two) times daily. (0800 & 2000) For Constipation)   pyridostigmine (MESTINON) 60 MG tablet Take 1 tablet (60 mg total) by mouth 3 (three) times daily.   simethicone (MYLICON) 125 MG chewable tablet Chew 125 mg by mouth every 6 (six) hours as needed for flatulence.   sucralfate (CARAFATE)  1 g tablet Take 1 tablet (1 g total) by mouth 4 (four) times daily. (Patient taking differently: Take 1 g by mouth 4 (four) times daily. (0900, 1300, 1700 & 2100))   tamsulosin (FLOMAX) 0.4 MG CAPS capsule TAKE 1 CAPSULE BY MOUTH AT BEDTIME. (Patient taking differently: Take 0.4 mg by mouth at bedtime. (2100))   traZODone (DESYREL) 100 MG tablet Take 100 mg by mouth at bedtime. (2100)   TRULICITY 0.75 MG/0.5ML SOPN Inject into the skin.   vitamin B-12 (CYANOCOBALAMIN) 500 MCG tablet Take 1,000 mcg by mouth daily. (0900)   XARELTO 20 MG TABS tablet Take 20 mg by mouth daily.   [DISCONTINUED] ramipril (ALTACE) 10 MG capsule Take 2 capsules (20 mg total) by mouth daily.   No facility-administered encounter medications on file as of 11/04/2022.   ALLERGIES: No Known Allergies  VACCINATION STATUS: Immunization History  Administered Date(s) Administered   Influenza Whole 03/23/2014   Influenza-Unspecified 03/23/2017   Pneumococcal Conjugate-13 06/25/2017   Pneumococcal Polysaccharide-23 05/02/2013    HPI CORTLEN DUBS is 72 y.o. male who presents today with a medical history as above. he is being seen in consultation for hypercalcemia requested by Charlynne Pander, MD. Mr. 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  He is accompanied by his nursing home  aide-Chance. He is coming from [facility].  He was found to have hypercalcemia for at least a year.  His most recent labs are from Oct 18, 2022 showing calcium of 10.8.  Patient also has CKD stage IIIa with BUN of 34 and creatinine of 1.44. No associated PTH with his recent labs.  However his December 2023 PTH was low at 20 There is no documented history of nephrolithiasis.  There is no documented history of bone density studies.  He is not on calcium supplements. 24-hour urine calcium measurement was conducted in December 2023 when it was 167.  Review of Systems  Difficult to elicit review of systems.  Objective:       11/04/2022    1:58 PM 10/09/2022    1:02 PM 05/19/2022    1:10 PM  Vitals with BMI  Systolic 120 144 604  Diastolic 78 82 87  Pulse 84 98 85    BP 120/78   Pulse 84   Wt Readings from Last 3 Encounters:  05/16/21 158 lb 9.6 oz (71.9 kg)  02/05/21 143 lb (64.9 kg)  12/13/20 141 lb (64 kg)    Physical Exam  Constitutional:  There is no height or weight on file to calculate BMI.,  Patient is in a wheelchair, not in acute distress.   Eyes: PERRLA, EOMI, no exophthalmos ENT: moist mucous membranes, no gross thyromegaly, no gross cervical lymphadenopathy Cardiovascular: normal precordial activity, Regular Rate and Rhythm, no Murmur/Rubs/Gallops Respiratory:  adequate breathing efforts, no gross chest deformity, Clear to auscultation bilaterally Gastrointestinal: abdomen soft, Non -tender, No distension, Bowel Sounds present, no gross organomegaly Musculoskeletal: no gross deformities, strength intact in all four extremities, no peripheral edema Skin: moist, warm, no rashes Neurological: Motor function 3 out of 5 on right upper extremity, 4 out of 5 on left upper extremity.     CMP ( most recent) CMP     Component Value Date/Time   NA 135 06/18/2021 0807   K 4.0 06/18/2021 0807   CL 99 06/18/2021 0807   CO2 26 06/18/2021 0807    GLUCOSE 209 (  H) 06/18/2021 0807   BUN 32 (H) 06/18/2021 0807   CREATININE 1.14 06/18/2021 0807   CREATININE 1.29 (H) 12/31/2016 0830   CALCIUM 11.9 (H) 05/19/2022 1337   PROT 7.4 06/18/2021 0807   ALBUMIN 3.9 06/18/2021 0807   AST 13 (L) 06/18/2021 0807   ALT 14 06/18/2021 0807   ALKPHOS 100 06/18/2021 0807   BILITOT 0.3 06/18/2021 0807   GFRNONAA >60 06/18/2021 0807   GFRAA >60 02/21/2020 1349     Diabetic Labs (most recent): Lab Results  Component Value Date   HGBA1C 7.1 (H) 09/08/2020   HGBA1C 9.8 (H) 06/06/2020   HGBA1C 7.7 (H) 01/05/2020   MICROALBUR 0.4 12/31/2016     Lipid Panel ( most recent) Lipid Panel     Component Value Date/Time   CHOL 140 01/06/2020 0449   TRIG 78 01/06/2020 0449   HDL 41 01/06/2020 0449   CHOLHDL 3.4 01/06/2020 0449   VLDL 16 01/06/2020 0449   LDLCALC 83 01/06/2020 0449      Lab Results  Component Value Date   TSH 0.312 (L) 06/20/2020   TSH 0.163 (L) 06/06/2020   FREET4 1.02 06/22/2020     Latest Reference Range & Units 05/21/22 10:22 05/21/22 10:23  Calcium, Ur Not Estab. mg/dL 11.9   Calcium, 24 hour urine 0 - 320 mg/24 hr 167   Total Volume  1,110   Creatinine, Urine mg/dL  14.78  Urine Total [id number] mL  1,100  Collection Interval-UCRE24 hours  24  Creatinine, 24H Ur 800 - 2,000 mg/day  664 (L)  (L): Data is abnormally low  Assessment & Plan:   1. Hypercalcemia 2.  Immobilization hypercalcemia?  - Daniel [name]  is being seen at a kind request of Charlynne Pander, MD. - I have reviewed his available  records and clinically evaluated the patient. - Based on these reviews, he has mild hypercalcemia without elevated PTH on the background of CKD stage 3a,  however,  there is not sufficient information to proceed with definitive treatment plan. -Etiology not clear likely multifactorial including immobilization versus early, mild primary hyperparathyroidism.  He will be considered for complete assessment with PTH, PTH RP,  serum magnesium and phosphorus, CMP/calcium.  He will also benefit from bone density assessment. She will not need immediate intervention, he will return in 3 months with his labs.  - I did not initiate any new prescriptions today. - he is advised to maintain close follow up with Charlynne Pander, MD for primary care needs.   - Time spent with the patient: 45 minutes, of which >50% was spent in  counseling him about his hypercalcemia and the rest in obtaining information about his symptoms, reviewing his previous labs/studies ( including abstractions from other facilities),  evaluations, and treatments,  and developing a plan to confirm diagnosis and long term treatment based on the latest standards of care/guidelines; and documenting his care.  Daniel [name] participated in the discussions, expressed understanding, and voiced agreement with the above plans.  All questions were answered to his satisfaction. he is encouraged to contact clinic should he have any questions or concerns prior to his return visit.  Follow up plan: Return in about 3 months (around 02/04/2023) for F/U with Pre-visit Labs, DXA Scan B4 NV, 24 Hr Urine Ca & Cr.   Marquis Lunch, MD [facility] [address] [place], [place] [address] Phone: [phone]  Fax: [phone]     11/04/2022, 6:09 PM  This note  was partially dictated with voice recognition software. Similar sounding words can be transcribed inadequately or may not  be corrected upon review.

## 2022-11-12 ENCOUNTER — Telehealth: Payer: Self-pay | Admitting: "Endocrinology

## 2022-11-12 NOTE — Telephone Encounter (Signed)
Daniel [name] had an appt on 5/28. 24hr urine was ordered at that time. Victorino Dike from [employer] called to see if these orders can be cancelled. Pt is completely incontinent and they are unable to collect.

## 2022-11-12 NOTE — Telephone Encounter (Signed)
Caregivers notified.

## 2022-12-22 ENCOUNTER — Ambulatory Visit (HOSPITAL_COMMUNITY)
Admission: RE | Admit: 2022-12-22 | Discharge: 2022-12-22 | Disposition: A | Discharge status: Home / Self Care | Payer: Medicare Other | Source: Ambulatory Visit | Attending: "Endocrinology | Admitting: "Endocrinology

## 2022-12-22 DIAGNOSIS — M8588 Other specified disorders of bone density and structure, other site: Secondary | ICD-10-CM | POA: Diagnosis not present

## 2023-02-10 ENCOUNTER — Ambulatory Visit: Payer: Medicare Other | Admitting: "Endocrinology

## 2023-02-13 LAB — HEMOGLOBIN A1C: A1c: 9.9

## 2023-03-10 ENCOUNTER — Ambulatory Visit: Payer: Medicare Other | Admitting: "Endocrinology

## 2023-03-12 ENCOUNTER — Ambulatory Visit (INDEPENDENT_AMBULATORY_CARE_PROVIDER_SITE_OTHER): Payer: Medicare Other | Admitting: "Endocrinology

## 2023-03-12 ENCOUNTER — Encounter: Payer: Self-pay | Admitting: "Endocrinology

## 2023-03-12 DIAGNOSIS — Z794 Long term (current) use of insulin: Secondary | ICD-10-CM

## 2023-03-12 DIAGNOSIS — N1831 Chronic kidney disease, stage 3a: Secondary | ICD-10-CM

## 2023-03-12 DIAGNOSIS — E1122 Type 2 diabetes mellitus with diabetic chronic kidney disease: Secondary | ICD-10-CM

## 2023-03-12 NOTE — Progress Notes (Signed)
03/12/2023, 7:11 PM  Endocrinology follow-up note   Subjective:    Patient ID: Daniel [name], male    DOB: August 05, 1950, PCP Charlynne Pander, MD   Past Medical History:  Diagnosis Date   Acute kidney failure (HCC)    Acute respiratory failure (HCC)    Adenomatous polyp of stomach    Anemia    Benign prostate hyperplasia    Dementia (HCC)    Depression    Diabetes mellitus without complication (HCC)    DVT (deep venous thrombosis) (HCC)    Gastric ulcer    GERD (gastroesophageal reflux disease)    High cholesterol    Hypertension    Hypo-osmolality and hyponatremia    Insomnia    Neuromuscular dysfunction of bladder    Osteoarthritis    Partial intestinal obstruction (HCC)    Pneumonitis    Protein-calorie malnutrition, moderate (HCC)    Stroke (HCC)    Supplemental oxygen dependent    24/7   Wheelchair bound    Past Surgical History:  Procedure Laterality Date   BIOPSY  06/21/2020   Procedure: BIOPSY;  Surgeon: Corbin Ade, MD;  [facility];  Service: Endoscopy;;  gastric   BIOPSY  09/18/2020   Procedure: BIOPSY;  Surgeon: Dolores Frame, MD;  Location: [facility];  Service: Gastroenterology;;   COLONOSCOPY WITH PROPOFOL N/A 12/14/2020   Procedure: COLONOSCOPY WITH PROPOFOL;  Surgeon: Dolores Frame, MD;  [facility]: [facility];  Service: Gastroenterology;  Laterality: N/A;  8:55   ENDOSCOPIC MUCOSAL RESECTION N/A 11/15/2020   Procedure: ENDOSCOPIC MUCOSAL RESECTION;  Surgeon: Meridee Score Netty Starring., MD;  Location: [facility] ENDOSCOPY;  Service: Gastroenterology;  Laterality: N/A;   ESOPHAGOGASTRODUODENOSCOPY (EGD) WITH PROPOFOL N/A 06/21/2020   Procedure: ESOPHAGOGASTRODUODENOSCOPY (EGD) WITH PROPOFOL;  Surgeon: Corbin Ade, MD;  [facility];  Service: Endoscopy;  Laterality: N/A;   ESOPHAGOGASTRODUODENOSCOPY (EGD) WITH PROPOFOL N/A 09/18/2020   Procedure:  ESOPHAGOGASTRODUODENOSCOPY (EGD) WITH PROPOFOL;  Surgeon: Dolores Frame, MD;  [facility];  Service: Gastroenterology;  Laterality: N/A;   ESOPHAGOGASTRODUODENOSCOPY (EGD) WITH PROPOFOL N/A 11/15/2020   Procedure: ESOPHAGOGASTRODUODENOSCOPY (EGD) WITH PROPOFOL;  Surgeon: Meridee Score Netty Starring., MD;  Location: [facility] ENDOSCOPY;  Service: Gastroenterology;  Laterality: N/A;   FLEXIBLE SIGMOIDOSCOPY N/A 11/15/2020   Procedure: FLEXIBLE SIGMOIDOSCOPY;  Surgeon: Meridee Score Netty Starring., MD;  Location: [facility] ENDOSCOPY;  Service: Gastroenterology;  Laterality: N/A;   HEMOSTASIS CLIP PLACEMENT  11/15/2020   Procedure: HEMOSTASIS CLIP PLACEMENT;  Surgeon: Lemar Lofty., MD;  Location: [facility] ENDOSCOPY;  Service: Gastroenterology;;   HIP SURGERY Left    KIDNEY SURGERY     kidney removed   POLYPECTOMY  11/15/2020   Procedure: POLYPECTOMY;  Surgeon: Mansouraty, Netty Starring., MD;  Location: [facility] ENDOSCOPY;  Service: Gastroenterology;;   SUBMUCOSAL LIFTING INJECTION  11/15/2020   Procedure: SUBMUCOSAL LIFTING INJECTION;  Surgeon: Lemar Lofty., MD;  Location: [facility] ENDOSCOPY;  Service: Gastroenterology;;   SUBMUCOSAL TATTOO INJECTION  11/15/2020   Procedure: SUBMUCOSAL TATTOO INJECTION;  Surgeon: Lemar Lofty., MD;  Location: [facility] ENDOSCOPY;  Service: Gastroenterology;;   Social History   Socioeconomic History   Marital status: Single    Spouse name: Not on file  Number of children: Not on file   Years of education: Not on file   Highest education level: Not on file  Occupational History   Not on file  Tobacco Use   Smoking status: Never   Smokeless tobacco: Never  Vaping Use   Vaping status: Never Used  Substance and Sexual Activity   Alcohol use: No   Drug use: No   Sexual activity: Never  Other Topics Concern   Not on file  Social History Narrative   Not on file   Social Determinants of Health   Financial Resource Strain: Not on file  Food Insecurity: No Food  Insecurity (07/28/2020)   Received from [facility], [facility]   Hunger Vital Sign    Worried About Running Out of Food in the Last Year: Never true    Ran Out of Food in the Last Year: Never true  Transportation Needs: No Transportation Needs (07/28/2020)   Received from [facility], [facility]   [facility] - Transportation    Lack of Transportation (Medical): No    Lack of Transportation (Non-Medical): No  Physical Activity: Not on file  Stress: No Stress Concern Present (07/28/2020)   Received from [facility], [facility]   [facility] - Occupational Stress Questionnaire    Feeling of Stress : Only a little  Social Connections: Not on file   Family History  Problem Relation Age of Onset   Hypertension Sister    Hyperlipidemia Sister    Osteoarthritis Sister    Rheum arthritis Sister    Breast cancer Niece    Colon cancer Neg Hx    Esophageal cancer Neg Hx    Inflammatory bowel disease Neg Hx    Liver disease Neg Hx    Pancreatic cancer Neg Hx    Stomach cancer Neg Hx    Rectal cancer Neg Hx    Outpatient Encounter Medications as of 03/12/2023  Medication Sig   atorvastatin (LIPITOR) 20 MG tablet Take 20 mg by mouth daily.   cholecalciferol (VITAMIN D3) 25 MCG (1000 UNIT) tablet Take 1,000 Units by mouth daily.   Ferrous Sulfate 220 (44 Fe) MG/5ML LIQD Take 7.5 mLs by mouth 2 (two) times daily.   folic acid (FOLVITE) 400 MCG tablet Take 800 mcg by mouth every morning. (0800)   hydrochlorothiazide (HYDRODIURIL) 25 MG tablet Take 25 mg by mouth 2 (two) times daily.   insulin glargine (LANTUS) 100 UNIT/ML injection Inject 10 Units into the skin daily. 7 units SQ QHS.   Insulin Lispro (HUMALOG KWIKPEN Carrizales) Inject 5 Units into the skin every 12 (twelve) hours as needed. 7 units Q am   magnesium oxide (MAG-OX) 400 MG tablet Take 1 tablet (400 mg total) by mouth 2 (two) times daily.   metFORMIN (GLUCOPHAGE) 1000 MG tablet Take 1  tablet (1,000 mg total) by mouth 2 (two) times daily with a meal. (Patient taking differently: Take 1,000 mg by mouth in the morning and at bedtime. (1000 & 1700))   metoCLOPramide (REGLAN) 5 MG tablet Take 1 tablet (5 mg total) by mouth 3 (three) times daily. For stomach   metoprolol tartrate (LOPRESSOR) 25 MG tablet Take 1 tablet (25 mg total) by mouth 2 (two) times daily. (Patient taking differently: Take 25 mg by mouth 2 (two) times daily. (1000 & 1700))   omeprazole (PRILOSEC) 20 MG capsule Take 20 mg by mouth 2 (two) times daily.   ondansetron (ZOFRAN) 4 MG  tablet Take 1 tablet (4 mg total) by mouth every 6 (six) hours as needed for nausea.   pantoprazole (PROTONIX) 40 MG tablet Take 1 tablet (40 mg total) by mouth 2 (two) times daily before a meal.   polyethylene glycol (MIRALAX) 17 g packet Take 17 g by mouth 2 (two) times daily. For Constipation (Patient taking differently: Take 17 g by mouth 2 (two) times daily. (0800 & 2000) For Constipation)   pyridostigmine (MESTINON) 60 MG tablet Take 1 tablet (60 mg total) by mouth 3 (three) times daily.   simethicone (MYLICON) 125 MG chewable tablet Chew 125 mg by mouth every 6 (six) hours as needed for flatulence.   sucralfate (CARAFATE) 1 g tablet Take 1 tablet (1 g total) by mouth 4 (four) times daily. (Patient taking differently: Take 1 g by mouth 4 (four) times daily. (0900, 1300, 1700 & 2100))   tamsulosin (FLOMAX) 0.4 MG CAPS capsule TAKE 1 CAPSULE BY MOUTH AT BEDTIME. (Patient taking differently: Take 0.4 mg by mouth at bedtime. (2100))   traZODone (DESYREL) 100 MG tablet Take 100 mg by mouth at bedtime. (2100)   TRULICITY 0.75 MG/0.5ML SOPN Inject into the skin.   vitamin B-12 (CYANOCOBALAMIN) 500 MCG tablet Take 1,000 mcg by mouth daily. (0900)   XARELTO 20 MG TABS tablet Take 20 mg by mouth daily.   [DISCONTINUED] ramipril (ALTACE) 10 MG capsule Take 2 capsules (20 mg total) by mouth daily.   No facility-administered encounter medications  on file as of 03/12/2023.   ALLERGIES: No Known Allergies  VACCINATION STATUS: Immunization History  Administered Date(s) Administered   Influenza Whole 03/23/2014   Influenza-Unspecified 03/23/2017   Pneumococcal Conjugate-13 06/25/2017   Pneumococcal Polysaccharide-23 05/02/2013    HPI Daniel [name] is 72 y.o. male who presents today with a medical history as above. he is being seen in follow-up after he was seen in consultation for hypercalcemia requested by Charlynne Pander, MD. 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  He is accompanied by his nursing home aide-Chance. -He was found to have mild hypercalcemia in the range of 10.8 associated with normal PTH between 20-33.  This was thought to be immobilization hypercalcemia versus early, mild primary hyperparathyroidism. In the interval, he was found to have hypomagnesemia and hypophosphatemia. Patient also has CKD stage IIIa with BUN of 34 and creatinine of 1.44.  There is no documented history of nephrolithiasis.  There is no documented history of bone density studies.  He is not on calcium supplements. 24-hour urine calcium measurement was conducted in December 2023 when it was 167.  Review of Systems  Difficult to elicit review of systems.  Objective:       03/12/2023    3:58 PM 11/04/2022    1:58 PM 10/09/2022    1:02 PM  Vitals with BMI  Weight   --  Systolic 110 120 409  Diastolic 68 78 82  Pulse 88 84 98    BP 110/68   Pulse 88   Wt Readings from Last 3 Encounters:  05/16/21 158 lb 9.6 oz (71.9 kg)  02/05/21 143 lb (64.9 kg)  12/13/20 141 lb (64 kg)    Physical Exam  Constitutional:  There is no height or weight on file to calculate BMI.,  Patient is in a wheelchair, not in acute distress.   Eyes: PERRLA, EOMI, no exophthalmos ENT: moist mucous membranes, no gross thyromegaly, no gross cervical  lymphadenopathy    CMP ( most  recent) CMP     Component Value Date/Time   NA 135 06/18/2021 0807   K 4.0 06/18/2021 0807   CL 99 06/18/2021 0807   CO2 26 06/18/2021 0807   GLUCOSE 209 (H) 06/18/2021 0807   BUN 32 (H) 06/18/2021 0807   CREATININE 1.14 06/18/2021 0807   CREATININE 1.29 (H) 12/31/2016 0830   CALCIUM 11.9 (H) 05/19/2022 1337   PROT 7.4 06/18/2021 0807   ALBUMIN 3.9 06/18/2021 0807   AST 13 (L) 06/18/2021 0807   ALT 14 06/18/2021 0807   ALKPHOS 100 06/18/2021 0807   BILITOT 0.3 06/18/2021 0807   GFRNONAA >60 06/18/2021 0807   GFRAA >60 02/21/2020 1349     Diabetic Labs (most recent): Lab Results  Component Value Date   HGBA1C 7.1 (H) 09/08/2020   HGBA1C 9.8 (H) 06/06/2020   HGBA1C 7.7 (H) 01/05/2020   MICROALBUR 0.4 12/31/2016     Lipid Panel ( most recent) Lipid Panel     Component Value Date/Time   CHOL 140 01/06/2020 0449   TRIG 78 01/06/2020 0449   HDL 41 01/06/2020 0449   CHOLHDL 3.4 01/06/2020 0449   VLDL 16 01/06/2020 0449   LDLCALC 83 01/06/2020 0449      Lab Results  Component Value Date   TSH 0.312 (L) 06/20/2020   TSH 0.163 (L) 06/06/2020   FREET4 1.02 06/22/2020     Latest Reference Range & Units 05/21/22 10:22 05/21/22 10:23  Calcium, Ur Not Estab. mg/dL 16.1   Calcium, 24 hour urine 0 - 320 mg/24 hr 167   Total Volume  1,110   Creatinine, Urine mg/dL  09.60  Urine Total [id number] mL  1,100  Collection Interval-UCRE24 hours  24  Creatinine, 24H Ur 800 - 2,000 mg/day  664 (L)  (L): Data is abnormally low  Assessment & Plan:   1. Hypercalcemia 2.  Immobilization hypercalcemia? 3.  Hypomagnesemia 4.  Hypophosphatemia 5.  Type 2 diabetes  - DESHAY MEDD  is being seen at a kind request of Charlynne Pander, MD. - I have reviewed his  new and available  records and clinically evaluated the patient. - Based on these reviews, he has mild hypercalcemia without elevated PTH on the background of CKD stage 3a. His calcium level is improving from 11+ to  10.8.  -Etiology not clear likely multifactorial including immobilization versus early, mild primary hyperparathyroidism. His bone density in July showed osteopenia.  His PTH RP was undetectable-making malignancy related hypercalcemia unlikely.  He will not need immediate, definitive intervention.  However, he will need correction of his hypomagnesemia and hypophosphatemia. I discussed his care with his aide to continue magnesium oxide 400 mg p.o. twice daily.  He will be tried with dietary sources of phosphate by increasing his portions of food items such as meats, fish, eggs, nuts and whole grains.  He will be considered for complete assessment with PTH, PTH RP, serum magnesium and phosphorus, CMP/calcium.  He will also benefit from bone density assessment. She will not need immediate intervention, he will return in 3 months with his labs.  For his type 2 diabetes, I advised to increase Lantus to 16 units nightly, advised to continue Humalog 5-11 units 3 times daily AC.  - he is advised to maintain close follow up with Charlynne Pander, MD for primary care needs.    I spent  25  minutes in the care of the patient today including review of labs from Thyroid  Function, CMP, and other relevant labs ; imaging/biopsy records (current and previous including abstractions from other facilities); face-to-face time discussing  his lab results and symptoms, medications doses, his options of short and long term treatment based on the latest standards of care / guidelines;   and documenting the encounter.  Daniel [name]  participated in the discussions, expressed understanding, and voiced agreement with the above plans.  All questions were answered to his satisfaction. he is encouraged to contact clinic should he have any questions or concerns prior to his return visit.   Follow up plan: Return in about 4 months (around 07/13/2023) for F/U with Pre-visit Labs.   Marquis Lunch, MD [facility]  [facility] [address] [place], [place] [address] Phone: [phone]  Fax: [phone]     03/12/2023, 7:11 PM  This note was partially dictated with voice recognition software. Similar sounding words can be transcribed inadequately or may not  be corrected upon review.

## 2023-04-02 NOTE — Addendum Note (Signed)
Addended by: Cynda Acres A on: 04/02/2023 12:04 PM   Modules accepted: Orders

## 2023-04-09 ENCOUNTER — Inpatient Hospital Stay: Payer: Medicare Other

## 2023-04-16 ENCOUNTER — Encounter (HOSPITAL_COMMUNITY): Payer: Self-pay | Admitting: Hematology

## 2023-04-16 ENCOUNTER — Inpatient Hospital Stay: Payer: Medicare Other | Attending: Physician Assistant | Admitting: Oncology

## 2023-04-16 VITALS — BP 115/66 | HR 76 | Temp 97.4°F | Resp 16

## 2023-04-16 DIAGNOSIS — D631 Anemia in chronic kidney disease: Secondary | ICD-10-CM | POA: Diagnosis present

## 2023-04-16 DIAGNOSIS — N183 Chronic kidney disease, stage 3 unspecified: Secondary | ICD-10-CM | POA: Diagnosis not present

## 2023-04-16 DIAGNOSIS — N1831 Chronic kidney disease, stage 3a: Secondary | ICD-10-CM | POA: Insufficient documentation

## 2023-04-16 NOTE — Progress Notes (Signed)
[facility] [address], [place] [address]   CLINIC:  Medical Oncology/Hematology  PCP:  Charlynne Pander, MD [address] [place] [address] [phone]   REASON FOR VISIT:  Follow-up for normocytic anemia and hypercalcemia  CURRENT THERAPY: Daily iron tablet  INTERVAL HISTORY:   Mr. Arizola 72 y.o. male returns for routine follow-up of normocytic anemia.  He was last seen by Rojelio Brenner PA-C on 10/09/2022.  He presents today with his legal guardian.  At today's visit he reports feeling well.  Denies any interval hospitalizations, surgeries or changes to his baseline health.  Denies any bleeding.  Energy and appetite are 100%.  Weight is stable.  He recently met with endocrinology to discuss his hypercalcemia.  Chance, his caregiver states his magnesium level is low and [facility] is supposed to be giving him a magnesium infusion today.  He continues oral magnesium, iron, folic acid and vitamin B12 supplements daily.  As far as chance knows, he has been getting all of his recommended supplements.  He denies any reports of bleeding.  Overall, he has been doing well.  ASSESSMENT   1.  Normocytic anemia: - Patient seen at the request of Dr. Levon Hedger for further management of normocytic anemia. - He had history of GI bleed secondary to severe esophagitis, gastric ulcers. 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 - Most recent EGD (11/15/2020): Moderate gastritis, scars from healed ulcers in gastric body.  Duodenal polyp x 2. - Colonoscopy (11/15/2020): Nonbleeding hemorrhoids, polyp x 1.  Poor colon prep. - Additional hematology workup (10/11/2020): Normal LDH.  SPEP negative.  Elevated light chains in keeping with CKD.  Normal  copper.    2.   Mild hypercalcemia: - Urine studies do not show evidence of familial hypocalciuric hypercalcemia.  He has normal PTH, PTH related peptide, vitamin D 1, 25 dihydroxy, and vitamin D 25-hydroxy.  SPEP normal. - Prior labs show low TSH suspicious for hyperthyroidism, which could be causing some hypercalcemia. - Most recent labs from SNF (04/09/23) show elevated calcium 10.9.  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  Elevated kappa free light chain 70.2, elevated lambda free light chain 31.0, mildly elevated FLC ratio 2.26 in keeping with his underlying CKD stage IIIa. -He has been referred to endocrinology and was evaluated by Dr. Fransico Him on 03/12/2023.  -Etiology not clear likely multifactorial including immobilization versus early, mild primary hyperparathyroidism. -His bone density in July showed osteopenia.  -His PTH RP was undetectable-making malignancy related hypercalcemia unlikely. -It was recommended he start magnesium oxide 400 mg p.o. twice daily and increasing the amount of phosphate in his diet. -He also recommended a bone density assessment which he has not had done yet.  4.  Social/family history: - He is now a resident at [facility] in [place].  He is seen with his legal guardian, Chance. - No family history of sickle cell or thalassemia.  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 Anemia due to stage 3 chronic kidney disease, unspecified whether stage 3a or 3b CKD (HCC) - Patient currently taking ferrous sulfate 325 mg daily. - Taking  daily B12 1000 mcg and folic acid 800 mcg. - No bleeding or melena reported. - Most recent labs from [facility]: 04/03/2023  Hemoglobin 10.3/MCV 85.9  Ferritin 32.70, iron saturation 14%, TIBC 254   Creatinine 1.72/GFR 43 (CKD stage  IIIa)  SPEP, immunofixation normal  Normal folate, B12 and MMA -Recommend continuing oral iron, B12 and folic acid supplements. -Recommend rechecking CBC, ferritin, iron panel, CMP in 6 months. -Previously discussed if hemoglobin continues to drop less than 10 would consider IV iron versus ESA therapy.  2. Hypercalcemia -Calcium level 10.9. -He is being followed by endocrinology.  3.  Hypomagnesemia -Magnesium level 1.1 today. -Per caregiver, he is scheduled to have a magnesium infusion at [facility] rehab today. -She continues oral magnesium supplements daily.  PLAN SUMMARY: >> Labs in 6 months = CBC/D, ferritin, iron TIBC, CMP >> Office visit in 6 months, 1 week after labs     REVIEW OF SYSTEMS:   Review of Systems  Constitutional: Negative.  Negative for appetite change, chills, fatigue and fever.  HENT:  Negative.  Negative for hearing loss, lump/mass, mouth sores and nosebleeds.   Eyes: Negative.  Negative for eye problems.  Respiratory:  Negative for cough, hemoptysis and shortness of breath.   Cardiovascular: Negative.  Negative for chest pain and leg swelling.  Gastrointestinal: Negative.  Negative for abdominal pain, blood in stool, constipation, diarrhea, nausea and vomiting.  Endocrine: Negative.  Negative for hot flashes.  Genitourinary: Negative.  Negative for bladder incontinence, difficulty urinating, dysuria, frequency and hematuria.   Musculoskeletal: Negative.  Negative for back pain, flank pain, gait problem and myalgias.  Skin: Negative.  Negative for itching and rash.  Neurological: Negative.  Negative for dizziness, gait problem, headaches, light-headedness and numbness.  Hematological: Negative.  Negative for adenopathy.  Psychiatric/Behavioral:  Negative for confusion. The patient is not nervous/anxious.      PHYSICAL EXAM:  ECOG PERFORMANCE STATUS: 3 - Symptomatic, >50% confined to bed  There were no vitals filed for this visit. There were no vitals filed  for this visit. Physical Exam Constitutional:      Appearance: Normal appearance.  Cardiovascular:     Rate and Rhythm: Normal rate and regular rhythm.  Pulmonary:     Effort: Pulmonary effort is normal.     Breath sounds: Normal breath sounds.  Abdominal:     General: Bowel sounds are normal.     Palpations: Abdomen is soft.  Musculoskeletal:        General: No swelling. Normal range of motion.  Neurological:     Mental Status: He is alert and oriented to person, place, and time. Mental status is at baseline.     PAST MEDICAL/SURGICAL HISTORY:  Past Medical History:  Diagnosis Date   Acute kidney failure (HCC)    Acute respiratory failure (HCC)    Adenomatous polyp of stomach    Anemia    Benign prostate hyperplasia    Dementia (HCC)    Depression    Diabetes mellitus without complication (HCC)    DVT (deep venous thrombosis) (HCC)    Gastric ulcer    GERD (gastroesophageal reflux disease)    High cholesterol    Hypertension    Hypo-osmolality and hyponatremia    Insomnia    Neuromuscular dysfunction of bladder    Osteoarthritis    Partial intestinal obstruction (HCC)    Pneumonitis    Protein-calorie malnutrition, moderate (HCC)    Stroke (HCC)    Supplemental oxygen dependent  24/7   Wheelchair bound    Past Surgical History:  Procedure Laterality Date   BIOPSY  06/21/2020   Procedure: BIOPSY;  Surgeon: Corbin Ade, MD;  [facility];  Service: Endoscopy;;  gastric   BIOPSY  09/18/2020   Procedure: BIOPSY;  Surgeon: Dolores Frame, MD;  [facility]: [facility];  Service: Gastroenterology;;   COLONOSCOPY WITH PROPOFOL N/A 12/14/2020   Procedure: COLONOSCOPY WITH PROPOFOL;  Surgeon: Dolores Frame, MD;  [facility];  Service: Gastroenterology;  Laterality: N/A;  8:55   ENDOSCOPIC MUCOSAL RESECTION N/A 11/15/2020   Procedure: ENDOSCOPIC MUCOSAL RESECTION;  Surgeon: Meridee Score Netty Starring., MD;  Location: [facility]  ENDOSCOPY;  Service: Gastroenterology;  Laterality: N/A;   ESOPHAGOGASTRODUODENOSCOPY (EGD) WITH PROPOFOL N/A 06/21/2020   Procedure: ESOPHAGOGASTRODUODENOSCOPY (EGD) WITH PROPOFOL;  Surgeon: Corbin Ade, MD;  [facility];  Service: Endoscopy;  Laterality: N/A;   ESOPHAGOGASTRODUODENOSCOPY (EGD) WITH PROPOFOL N/A 09/18/2020   Procedure: ESOPHAGOGASTRODUODENOSCOPY (EGD) WITH PROPOFOL;  Surgeon: Dolores Frame, MD;  [facility];  Service: Gastroenterology;  Laterality: N/A;   ESOPHAGOGASTRODUODENOSCOPY (EGD) WITH PROPOFOL N/A 11/15/2020   Procedure: ESOPHAGOGASTRODUODENOSCOPY (EGD) WITH PROPOFOL;  Surgeon: Meridee Score Netty Starring., MD;  Location: [facility] ENDOSCOPY;  Service: Gastroenterology;  Laterality: N/A;   FLEXIBLE SIGMOIDOSCOPY N/A 11/15/2020   Procedure: FLEXIBLE SIGMOIDOSCOPY;  Surgeon: Meridee Score Netty Starring., MD;  Location: [facility] ENDOSCOPY;  Service: Gastroenterology;  Laterality: N/A;   HEMOSTASIS CLIP PLACEMENT  11/15/2020   Procedure: HEMOSTASIS CLIP PLACEMENT;  Surgeon: Lemar Lofty., MD;  Location: [facility] ENDOSCOPY;  Service: Gastroenterology;;   HIP SURGERY Left    KIDNEY SURGERY     kidney removed   POLYPECTOMY  11/15/2020   Procedure: POLYPECTOMY;  Surgeon: Mansouraty, Netty Starring., MD;  Location: [facility] ENDOSCOPY;  Service: Gastroenterology;;   SUBMUCOSAL LIFTING INJECTION  11/15/2020   Procedure: SUBMUCOSAL LIFTING INJECTION;  Surgeon: Lemar Lofty., MD;  Location: [facility] ENDOSCOPY;  Service: Gastroenterology;;   SUBMUCOSAL TATTOO INJECTION  11/15/2020   Procedure: SUBMUCOSAL TATTOO INJECTION;  Surgeon: Lemar Lofty., MD;  Location: [facility] ENDOSCOPY;  Service: Gastroenterology;;    SOCIAL HISTORY:  Social History   Socioeconomic History   Marital status: Single    Spouse name: Not on file   Number of children: Not on file   Years of education: Not on file   Highest education level: Not on file  Occupational History   Not on file   Tobacco Use   Smoking status: Never   Smokeless tobacco: Never  Vaping Use   Vaping status: Never Used  Substance and Sexual Activity   Alcohol use: No   Drug use: No   Sexual activity: Never  Other Topics Concern   Not on file  Social History Narrative   Not on file   Social Determinants of Health   Financial Resource Strain: Not on file  Food Insecurity: No Food Insecurity (07/28/2020)   Received from [facility], [facility]   Hunger Vital Sign    Worried About Running Out of Food in the Last Year: Never true    Ran Out of Food in the Last Year: Never true  Transportation Needs: No Transportation Needs (07/28/2020)   Received from [facility], [facility]   [facility] - Transportation    Lack of Transportation (Medical): No    Lack of Transportation (Non-Medical): No  Physical Activity: Not on file  Stress: No Stress Concern Present (07/28/2020)   Received from  [facility], [facility]   [facility] of Occupational [facility] - Occupational Stress Questionnaire    Feeling of Stress : Only a little  Social Connections: Not on file  Intimate Partner Violence: Not At Risk (10/11/2020)   Humiliation, Afraid, Rape, and Kick questionnaire    Fear of Current or Ex-Partner: No    Emotionally Abused: No    Physically Abused: No    Sexually Abused: No    FAMILY HISTORY:  Family History  Problem Relation Age of Onset   Hypertension Sister    Hyperlipidemia Sister    Osteoarthritis Sister    Rheum arthritis Sister    Breast cancer Niece    Colon cancer Neg Hx    Esophageal cancer Neg Hx    Inflammatory bowel disease Neg Hx    Liver disease Neg Hx    Pancreatic cancer Neg Hx    Stomach cancer Neg Hx    Rectal cancer Neg Hx     CURRENT MEDICATIONS:  Outpatient Encounter Medications as of 04/16/2023  Medication Sig Note   atorvastatin (LIPITOR) 20 MG tablet Take 20 mg by mouth daily.    cholecalciferol (VITAMIN D3) 25 MCG (1000 UNIT) tablet Take  1,000 Units by mouth daily.    Ferrous Sulfate 220 (44 Fe) MG/5ML LIQD Take 7.5 mLs by mouth 2 (two) times daily.    folic acid (FOLVITE) 400 MCG tablet Take 800 mcg by mouth every morning. (0800)    hydrochlorothiazide (HYDRODIURIL) 25 MG tablet Take 25 mg by mouth 2 (two) times daily.    insulin glargine (LANTUS) 100 UNIT/ML injection Inject 10 Units into the skin daily. 7 units SQ QHS.    Insulin Lispro (HUMALOG KWIKPEN [place]) Inject 5 Units into the skin every 12 (twelve) hours as needed. 7 units Q am    magnesium oxide (MAG-OX) 400 MG tablet Take 1 tablet (400 mg total) by mouth 2 (two) times daily.    metFORMIN (GLUCOPHAGE) 1000 MG tablet Take 1 tablet (1,000 mg total) by mouth 2 (two) times daily with a meal. (Patient taking differently: Take 1,000 mg by mouth in the morning and at bedtime. (1000 & 1700))    metoCLOPramide (REGLAN) 5 MG tablet Take 1 tablet (5 mg total) by mouth 3 (three) times daily. For stomach    metoprolol tartrate (LOPRESSOR) 25 MG tablet Take 1 tablet (25 mg total) by mouth 2 (two) times daily. (Patient taking differently: Take 25 mg by mouth 2 (two) times daily. (1000 & 1700))    omeprazole (PRILOSEC) 20 MG capsule Take 20 mg by mouth 2 (two) times daily.    ondansetron (ZOFRAN) 4 MG tablet Take 1 tablet (4 mg total) by mouth every 6 (six) hours as needed for nausea.    pantoprazole (PROTONIX) 40 MG tablet Take 1 tablet (40 mg total) by mouth 2 (two) times daily before a meal. 12/05/2020: (0800 & 2100)   polyethylene glycol (MIRALAX) 17 g packet Take 17 g by mouth 2 (two) times daily. For Constipation (Patient taking differently: Take 17 g by mouth 2 (two) times daily. (0800 & 2000) For Constipation)    pyridostigmine (MESTINON) 60 MG tablet Take 1 tablet (60 mg total) by mouth 3 (three) times daily.    simethicone (MYLICON) 125 MG chewable tablet Chew 125 mg by mouth every 6 (six) hours as needed for flatulence.    sucralfate (CARAFATE) 1 g tablet Take 1 tablet (1 g total)  by mouth 4 (four) times daily. (Patient taking differently:  Take 1 g by mouth 4 (four) times daily. (0900, 1300, 1700 & 2100))    tamsulosin (FLOMAX) 0.4 MG CAPS capsule TAKE 1 CAPSULE BY MOUTH AT BEDTIME. (Patient taking differently: Take 0.4 mg by mouth at bedtime. (2100))    traZODone (DESYREL) 100 MG tablet Take 100 mg by mouth at bedtime. (2100)    TRULICITY 0.75 MG/0.5ML SOPN Inject into the skin.    vitamin B-12 (CYANOCOBALAMIN) 500 MCG tablet Take 1,000 mcg by mouth daily. (0900)    XARELTO 20 MG TABS tablet Take 20 mg by mouth daily.    [DISCONTINUED] ramipril (ALTACE) 10 MG capsule Take 2 capsules (20 mg total) by mouth daily.    No facility-administered encounter medications on file as of 04/16/2023.    ALLERGIES:  No Known Allergies  LABORATORY DATA:  I have reviewed the labs as listed.  CBC    Component Value Date/Time   WBC 6.1 09/22/2022 1258   RBC 4.18 (L) 09/22/2022 1258   HGB 11.4 (L) 09/22/2022 1258   HCT 35.5 (L) 09/22/2022 1258   PLT 233 09/22/2022 1258   MCV 84.9 09/22/2022 1258   MCH 27.3 09/22/2022 1258   MCHC 32.1 09/22/2022 1258   RDW 14.6 09/22/2022 1258   LYMPHSABS 0.9 09/22/2022 1258   MONOABS 0.4 09/22/2022 1258   EOSABS 0.0 09/22/2022 1258   BASOSABS 0.0 09/22/2022 1258      Latest Ref Rng & Units 05/19/2022    1:37 PM 06/18/2021    8:07 AM 02/05/2021    3:21 PM  CMP  Glucose 70 - 99 mg/dL  295  621   BUN 8 - 23 mg/dL  32  40   Creatinine 3.08 - 1.24 mg/dL  6.57  8.46   Sodium 962 - 145 mmol/L  135  132   Potassium 3.5 - 5.1 mmol/L  4.0  4.5   Chloride 98 - 111 mmol/L  99  95   CO2 22 - 32 mmol/L  26  27   Calcium 8.6 - 10.2 mg/dL 95.2  84.1    32.4  40.1    11.2   Total Protein 6.5 - 8.1 g/dL  7.4  7.6   Total Bilirubin 0.3 - 1.2 mg/dL  0.3  0.2   Alkaline Phos 38 - 126 U/L  100  86   AST 15 - 41 U/L  13  12   ALT 0 - 44 U/L  14  11     DIAGNOSTIC IMAGING:  I have independently reviewed the relevant imaging and discussed with the  patient.   WRAP UP:  All questions were answered. The patient knows to call the clinic with any problems, questions or concerns.  Medical decision making: Low  Time spent on visit: I spent 20 minutes dedicated to the care of this patient (face-to-face and non-face-to-face) on the date of the encounter to include what is described in the assessment and plan.  Mauro Kaufmann, NP  04/16/23 1:49 PM

## 2023-04-22 ENCOUNTER — Encounter: Payer: Self-pay | Admitting: Adult Health Nurse Practitioner

## 2023-05-14 ENCOUNTER — Encounter (HOSPITAL_COMMUNITY): Payer: Self-pay | Admitting: Hematology

## 2023-07-08 ENCOUNTER — Encounter (HOSPITAL_COMMUNITY): Payer: Self-pay | Admitting: Hematology

## 2023-07-11 LAB — LAB REPORT - SCANNED
Calcium: 11.2
PTH: 39.5

## 2023-07-15 ENCOUNTER — Ambulatory Visit (INDEPENDENT_AMBULATORY_CARE_PROVIDER_SITE_OTHER): Payer: Medicare Other | Admitting: "Endocrinology

## 2023-07-15 ENCOUNTER — Encounter: Payer: Self-pay | Admitting: "Endocrinology

## 2023-07-15 DIAGNOSIS — E1122 Type 2 diabetes mellitus with diabetic chronic kidney disease: Secondary | ICD-10-CM

## 2023-07-15 DIAGNOSIS — N1831 Chronic kidney disease, stage 3a: Secondary | ICD-10-CM | POA: Diagnosis not present

## 2023-07-15 DIAGNOSIS — Z794 Long term (current) use of insulin: Secondary | ICD-10-CM | POA: Diagnosis not present

## 2023-07-15 LAB — POCT GLYCOSYLATED HEMOGLOBIN (HGB A1C): HbA1c, POC (controlled diabetic range): 7.1 % — AB (ref 0.0–7.0)

## 2023-07-15 MED ORDER — CINACALCET HCL 30 MG PO TABS: 30.0000 mg | ORAL_TABLET | Freq: Every day | ORAL | 1 refills | Status: AC

## 2023-07-15 NOTE — Patient Instructions (Signed)

## 2023-07-15 NOTE — Addendum Note (Signed)
 Addended by: Ernst Heap on: 07/15/2023 03:22 PM   Modules accepted: Orders

## 2023-07-15 NOTE — Progress Notes (Signed)
 07/15/2023, 1:45 PM  Endocrinology follow-up note   Subjective:    Patient ID: Daniel [name], male    DOB: 1950/09/27, PCP Isaiah Leisure, MD   Past Medical History:  Diagnosis Date   Acute kidney failure (HCC)    Acute respiratory failure (HCC)    Adenomatous polyp of stomach    Anemia    Benign prostate hyperplasia    Dementia (HCC)    Depression    Diabetes mellitus without complication (HCC)    DVT (deep venous thrombosis) (HCC)    Gastric ulcer    GERD (gastroesophageal reflux disease)    High cholesterol    Hypertension    Hypo-osmolality and hyponatremia    Insomnia    Neuromuscular dysfunction of bladder    Osteoarthritis    Partial intestinal obstruction (HCC)    Pneumonitis    Protein-calorie malnutrition, moderate (HCC)    Stroke [facility])    Supplemental oxygen  dependent    24/7   Wheelchair bound    Past Surgical History:  Procedure Laterality Date   BIOPSY  06/21/2020   Procedure: BIOPSY;  Surgeon: Shaaron Lamar HERO, MD;  [facility]: [facility];  Service: Endoscopy;;  gastric   BIOPSY  09/18/2020   Procedure: BIOPSY;  Surgeon: Eartha Angelia Sieving, MD;  Location: [facility];  Service: Gastroenterology;;   COLONOSCOPY WITH PROPOFOL  N/A 12/14/2020   Procedure: COLONOSCOPY WITH PROPOFOL ;  Surgeon: Eartha Angelia Sieving, MD;  Location: [facility];  Service: Gastroenterology;  Laterality: N/A;  8:55   ENDOSCOPIC MUCOSAL RESECTION N/A 11/15/2020   Procedure: ENDOSCOPIC MUCOSAL RESECTION;  Surgeon: Wilhelmenia Aloha Raddle., MD;  Location: [facility] ENDOSCOPY;  Service: Gastroenterology;  Laterality: N/A;   ESOPHAGOGASTRODUODENOSCOPY (EGD) WITH PROPOFOL  N/A 06/21/2020   Procedure: ESOPHAGOGASTRODUODENOSCOPY (EGD) WITH PROPOFOL ;  Surgeon: Shaaron Lamar HERO, MD;  [facility];  Service: Endoscopy;  Laterality: N/A;   ESOPHAGOGASTRODUODENOSCOPY (EGD) WITH PROPOFOL  N/A 09/18/2020   Procedure:  ESOPHAGOGASTRODUODENOSCOPY (EGD) WITH PROPOFOL ;  Surgeon: Eartha Angelia Sieving, MD;  [facility]: [facility];  Service: Gastroenterology;  Laterality: N/A;   ESOPHAGOGASTRODUODENOSCOPY (EGD) WITH PROPOFOL  N/A 11/15/2020   Procedure: ESOPHAGOGASTRODUODENOSCOPY (EGD) WITH PROPOFOL ;  Surgeon: Wilhelmenia Aloha Raddle., MD;  Location: [facility] ENDOSCOPY;  Service: Gastroenterology;  Laterality: N/A;   FLEXIBLE SIGMOIDOSCOPY N/A 11/15/2020   Procedure: FLEXIBLE SIGMOIDOSCOPY;  Surgeon: Wilhelmenia Aloha Raddle., MD;  Location: [facility] ENDOSCOPY;  Service: Gastroenterology;  Laterality: N/A;   HEMOSTASIS CLIP PLACEMENT  11/15/2020   Procedure: HEMOSTASIS CLIP PLACEMENT;  Surgeon: Wilhelmenia Aloha Raddle., MD;  Location: [facility] ENDOSCOPY;  Service: Gastroenterology;;   HIP SURGERY Left    KIDNEY SURGERY     kidney removed   POLYPECTOMY  11/15/2020   Procedure: POLYPECTOMY;  Surgeon: Mansouraty, Aloha Raddle., MD;  Location: [facility] ENDOSCOPY;  Service: Gastroenterology;;   SUBMUCOSAL LIFTING INJECTION  11/15/2020   Procedure: SUBMUCOSAL LIFTING INJECTION;  Surgeon: Wilhelmenia Aloha Raddle., MD;  Location: [facility] ENDOSCOPY;  Service: Gastroenterology;;   SUBMUCOSAL TATTOO INJECTION  11/15/2020   Procedure: SUBMUCOSAL TATTOO INJECTION;  Surgeon: Wilhelmenia Aloha Raddle., MD;  Location: [facility] ENDOSCOPY;  Service: Gastroenterology;;   Social History   Socioeconomic History   Marital status: Single    Spouse name: Not on file  Number of children: Not on file   Years of education: Not on file   Highest education level: Not on file  Occupational History   Not on file  Tobacco Use   Smoking status: Never   Smokeless tobacco: Never  Vaping Use   Vaping status: Never Used  Substance and Sexual Activity   Alcohol use: No   Drug use: No   Sexual activity: Never  Other Topics Concern   Not on file  Social History Narrative   Not on file   Social Drivers of Health   Financial Resource Strain: Not on file  Food Insecurity: No Food  Insecurity (07/28/2020)   Received from [facility]   Hunger Vital Sign    Worried About Running Out of Food in the Last Year: Never true    Ran Out of Food in the Last Year: Never true  Transportation Needs: No Transportation Needs (07/28/2020)   Received from [facility], [facility]   [facility] - Transportation    Lack of Transportation (Medical): No    Lack of Transportation (Non-Medical): No  Physical Activity: Not on file  Stress: No Stress Concern Present (07/28/2020)   Received from [facility], [facility]   [facility] of Occupational [facility] - Occupational Stress Questionnaire    Feeling of Stress : Only a little  Social Connections: Not on file   Family History  Problem Relation Age of Onset   Hypertension Sister    Hyperlipidemia Sister    Osteoarthritis Sister    Rheum arthritis Sister    Breast cancer Niece    Colon cancer Neg Hx    Esophageal cancer Neg Hx    Inflammatory bowel disease Neg Hx    Liver disease Neg Hx    Pancreatic cancer Neg Hx    Stomach cancer Neg Hx    Rectal cancer Neg Hx    Outpatient Encounter Medications as of 07/15/2023  Medication Sig   cinacalcet  (SENSIPAR ) 30 MG tablet Take 1 tablet (30 mg total) by mouth daily with breakfast.   atorvastatin  (LIPITOR) 20 MG tablet Take 20 mg by mouth daily.   cholecalciferol (VITAMIN D3) 25 MCG (1000 UNIT) tablet Take 1,000 Units by mouth daily.   Ferrous Sulfate  220 (44 Fe) MG/5ML LIQD Take 7.5 mLs by mouth 2 (two) times daily.   folic acid  (FOLVITE ) 400 MCG tablet Take 800 mcg by mouth every morning. (0800)   insulin  glargine (LANTUS) 100 UNIT/ML injection Inject 20 Units into the skin daily. 7 units SQ QHS.   Insulin  Lispro (HUMALOG KWIKPEN [place]ar) Inject 5-11 Units into the skin 3 (three) times daily before meals. 7 units Q am   magnesium  oxide (MAG-OX) 400 MG tablet Take 1 tablet (400 mg total) by mouth 2 (two) times daily.   metFORMIN  (GLUCOPHAGE ) 1000 MG tablet  Take 1 tablet (1,000 mg total) by mouth 2 (two) times daily with a meal. (Patient taking differently: Take 1,000 mg by mouth in the morning and at bedtime. (1000 & 1700))   metoCLOPramide  (REGLAN ) 5 MG tablet Take 1 tablet (5 mg total) by mouth 3 (three) times daily. For stomach   metoprolol  tartrate (LOPRESSOR ) 25 MG tablet Take 1 tablet (25 mg total) by mouth 2 (two) times daily. (Patient taking differently: Take 25 mg by mouth 2 (two) times daily. (1000 & 1700))   ondansetron  (ZOFRAN ) 4 MG tablet Take 1 tablet (4 mg total) by mouth every 6 (six) hours as  needed for nausea. (Patient not taking: Reported on 04/16/2023)   pantoprazole  (PROTONIX ) 40 MG tablet Take 1 tablet (40 mg total) by mouth 2 (two) times daily before a meal.   polyethylene glycol (MIRALAX ) 17 g packet Take 17 g by mouth 2 (two) times daily. For Constipation (Patient taking differently: Take 17 g by mouth 2 (two) times daily. (0800 & 2000) For Constipation)   pyridostigmine  (MESTINON ) 60 MG tablet Take 1 tablet (60 mg total) by mouth 3 (three) times daily.   simethicone  (MYLICON) 125 MG chewable tablet Chew 125 mg by mouth every 6 (six) hours as needed for flatulence. (Patient not taking: Reported on 04/16/2023)   sucralfate  (CARAFATE ) 1 g tablet Take 1 tablet (1 g total) by mouth 4 (four) times daily. (Patient taking differently: Take 1 g by mouth 4 (four) times daily. (0900, 1300, 1700 & 2100))   tamsulosin  (FLOMAX ) 0.4 MG CAPS capsule TAKE 1 CAPSULE BY MOUTH AT BEDTIME. (Patient taking differently: Take 0.4 mg by mouth at bedtime. (2100))   traZODone  (DESYREL ) 100 MG tablet Take 100 mg by mouth at bedtime. (2100)   vitamin B-12 (CYANOCOBALAMIN ) 500 MCG tablet Take 1,000 mcg by mouth daily. (0900)   XARELTO 20 MG TABS tablet Take 20 mg by mouth daily.   [DISCONTINUED] hydrochlorothiazide (HYDRODIURIL) 25 MG tablet Take 25 mg by mouth 2 (two) times daily.   [DISCONTINUED] ramipril  (ALTACE ) 10 MG capsule Take 2 capsules (20 mg total)  by mouth daily.   No facility-administered encounter medications on file as of 07/15/2023.   ALLERGIES: No Known Allergies  VACCINATION STATUS: Immunization History  Administered Date(s) Administered   Influenza Whole 03/23/2014   Influenza-Unspecified 03/23/2017   Pneumococcal Conjugate-13 06/25/2017   Pneumococcal Polysaccharide-23 05/02/2013    HPI Daniel [name] is 73 y.o. male who presents today with a medical history as above. he is being seen in follow-up after he was seen in consultation for hypercalcemia requested by Isaiah Leisure, MD. 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  He is accompanied by his nursing home aides -including Chance. -He continues to have hypercalcemia most recently 11.2 associated with inappropriately normal PTH of 39.5.    He is on hydrochlorothiazide 25 mg p.o. twice daily.  He remains on magnesium  supplement.  Patient has type 2 diabetes .   He is on Lantus 20 units nightly, Humalog 6 units 3 times daily AC as well as metformin  1000 mg p.o. twice daily.  His previously documented CKD has resolved to normal.  There is no documented history of nephrolithiasis.  There is no documented history of bone density studies.  He is not on calcium  supplements. 24-hour urine calcium  measurement was conducted in December 2023 when it was 167.  Review of Systems  Difficult to elicit review of systems.  Objective:       07/15/2023   11:31 AM 04/16/2023    2:19 PM 03/12/2023    3:58 PM  Vitals with BMI  Systolic 118 115 889  Diastolic 64 66 68  Pulse 80 76 88    BP 118/64   Pulse 80   Wt Readings from Last 3 Encounters:  05/16/21 158 lb 9.6 oz (71.9 kg)  02/05/21 143 lb (64.9 kg)  12/13/20 141 lb (64 kg)    Physical Exam  Constitutional:  There is no height or weight on file to calculate BMI.,  Patient is in a wheelchair, not in  acute distress.   Eyes: PERRLA, EOMI, no  exophthalmos ENT: moist mucous membranes, no gross thyromegaly, no gross cervical lymphadenopathy    CMP ( most recent) CMP     Component Value Date/Time   NA 135 06/18/2021 0807   K 4.0 06/18/2021 0807   CL 99 06/18/2021 0807   CO2 26 06/18/2021 0807   GLUCOSE 209 (H) 06/18/2021 0807   BUN 32 (H) 06/18/2021 0807   CREATININE 1.14 06/18/2021 0807   CREATININE 1.29 (H) 12/31/2016 0830   CALCIUM  11.2 07/11/2023 0852   CALCIUM  11.9 (H) 05/19/2022 1337   PROT 7.4 06/18/2021 0807   ALBUMIN 3.9 06/18/2021 0807   AST 13 (L) 06/18/2021 0807   ALT 14 06/18/2021 0807   ALKPHOS 100 06/18/2021 0807   BILITOT 0.3 06/18/2021 0807   GFRNONAA >60 06/18/2021 0807   GFRAA >60 02/21/2020 1349     Diabetic Labs (most recent): Lab Results  Component Value Date   HGBA1C 7.1 (H) 09/08/2020   HGBA1C 9.8 (H) 06/06/2020   HGBA1C 7.7 (H) 01/05/2020   MICROALBUR 0.4 12/31/2016     Lipid Panel ( most recent) Lipid Panel     Component Value Date/Time   CHOL 140 01/06/2020 0449   TRIG 78 01/06/2020 0449   HDL 41 01/06/2020 0449   CHOLHDL 3.4 01/06/2020 0449   VLDL 16 01/06/2020 0449   LDLCALC 83 01/06/2020 0449      Lab Results  Component Value Date   TSH 0.312 (L) 06/20/2020   TSH 0.163 (L) 06/06/2020   FREET4 1.02 06/22/2020     Latest Reference Range & Units 05/21/22 10:22 05/21/22 10:23  Calcium , Ur Not Estab. mg/dL 84.9   Calcium , 24 hour urine 0 - 320 mg/24 hr 167   Total Volume  1,110   Creatinine, Urine mg/dL  39.64  Urine Total Cnolfz-LRMZ75 mL  1,100  Collection Interval-UCRE24 hours  24  Creatinine, 24H Ur 800 - 2,000 mg/day  664 (L)  (L): Data is abnormally low  Assessment & Plan:   1. Hypercalcemia-PTH dependent 2.  Hypomagnesemia 3.  Hypophosphatemia 4.  Type 2 diabetes  - Daniel [name]  is being seen at a kind request of Isaiah Leisure, MD. - I have reviewed his  new and available  records and clinically evaluated the patient. -Patient presents with  resolution of his CKD, increasing calcium  to 11.2 associated with inappropriately normal PTH of 39.5. Etiology of his hypercalcemia now favoring mild primary hyperparathyroidism.  His bone density in July showed osteopenia.  His PTH RP was undetectable-making malignancy related hypercalcemia unlikely. -He is a poor surgical candidate.  He would benefit from early initiation of Sensipar .  I discussed and prescribed Sensipar  30 mg p.o. daily at breakfast.  He will continue to benefit from magnesium  supplement,  continue magnesium  oxide 400 mg p.o. twice daily.  He will be tried with dietary sources of phosphate by increasing his portions of food items such as meats, fish, dairy, eggs, nuts and whole grains.   For his type 2 diabetes, his point-of-care A1c today is 7.1%.   I have advised to continue Lantus 20 units nightly, Humalog 5-11 units 3 times daily AC.  He is also on metformin  1000 mg p.o. twice daily.  He has normal renal function.   - he is advised to maintain close follow up with Isaiah Leisure, MD for primary care needs.  I spent  26  minutes in the care of the patient today including review of labs from  CMP, Lipids, Thyroid Function, Hematology (current and previous including abstractions from other facilities); face-to-face time discussing  his blood glucose readings/logs, discussing hypoglycemia and hyperglycemia episodes and symptoms, medications doses, his options of short and long term treatment based on the latest standards of care / guidelines;  discussion about incorporating lifestyle medicine;  and documenting the encounter. Risk reduction counseling performed per USPSTF guidelines to reduce  cardiovascular risk factors.     Please refer to Patient Instructions for Blood Glucose Monitoring and Insulin /Medications Dosing Guide  in media tab for additional information. Please  also refer to  Patient Self Inventory in the Media  tab for reviewed elements of pertinent patient  history.  Daniel [name] in the discussions, expressed understanding, and voiced agreement with the above plans.  All questions were answered to his satisfaction. he is encouraged to contact clinic should he have any questions or concerns prior to his return visit.   Follow up plan: Return in about 6 months (around 01/12/2024) for F/U with Pre-visit Labs, Meter/CGM/Logs, A1c here.   Ranny Earl, MD [facility] [address] [place], [place] [address] Phone: [phone]  Fax: [phone]     07/15/2023, 1:45 PM  This note was partially dictated with voice recognition software. Similar sounding words can be transcribed inadequately or may not  be corrected upon review.

## 2023-10-15 ENCOUNTER — Encounter (HOSPITAL_COMMUNITY): Payer: Self-pay | Admitting: Hematology

## 2023-10-20 ENCOUNTER — Other Ambulatory Visit: Payer: Self-pay | Admitting: *Deleted

## 2023-10-20 DIAGNOSIS — N183 Chronic kidney disease, stage 3 unspecified: Secondary | ICD-10-CM

## 2023-10-20 DIAGNOSIS — D500 Iron deficiency anemia secondary to blood loss (chronic): Secondary | ICD-10-CM

## 2023-10-20 DIAGNOSIS — D649 Anemia, unspecified: Secondary | ICD-10-CM

## 2023-10-22 ENCOUNTER — Inpatient Hospital Stay: Payer: Medicare Other | Admitting: Oncology

## 2023-11-08 DEATH — deceased

## 2023-11-12 ENCOUNTER — Inpatient Hospital Stay: Admitting: Oncology

## 2024-01-12 ENCOUNTER — Ambulatory Visit: Payer: Medicare Other | Admitting: "Endocrinology
